# Patient Record
Sex: Female | Born: 1935 | Race: White | Hispanic: No | Smoking: Never smoker
Health system: Southern US, Academic
[De-identification: ages and names within clinical notes are randomized; demographics above are authoritative.]

## PROBLEM LIST (undated history)

## (undated) ENCOUNTER — Emergency Department (HOSPITAL_COMMUNITY): Admission: EM | Payer: Medicare Other | Source: Home / Self Care

## (undated) DIAGNOSIS — M3320 Polymyositis, organ involvement unspecified: Secondary | ICD-10-CM

## (undated) DIAGNOSIS — H938X9 Other specified disorders of ear, unspecified ear: Secondary | ICD-10-CM

## (undated) DIAGNOSIS — Z889 Allergy status to unspecified drugs, medicaments and biological substances status: Secondary | ICD-10-CM

## (undated) DIAGNOSIS — H538 Other visual disturbances: Secondary | ICD-10-CM

## (undated) DIAGNOSIS — I10 Essential (primary) hypertension: Secondary | ICD-10-CM

## (undated) DIAGNOSIS — M899 Disorder of bone, unspecified: Secondary | ICD-10-CM

## (undated) DIAGNOSIS — R110 Nausea: Secondary | ICD-10-CM

## (undated) DIAGNOSIS — J42 Unspecified chronic bronchitis: Secondary | ICD-10-CM

## (undated) DIAGNOSIS — K219 Gastro-esophageal reflux disease without esophagitis: Secondary | ICD-10-CM

## (undated) DIAGNOSIS — R197 Diarrhea, unspecified: Secondary | ICD-10-CM

## (undated) DIAGNOSIS — A310 Pulmonary mycobacterial infection: Principal | ICD-10-CM

## (undated) DIAGNOSIS — E039 Hypothyroidism, unspecified: Secondary | ICD-10-CM

## (undated) DIAGNOSIS — C50919 Malignant neoplasm of unspecified site of unspecified female breast: Secondary | ICD-10-CM

## (undated) DIAGNOSIS — R911 Solitary pulmonary nodule: Secondary | ICD-10-CM

## (undated) DIAGNOSIS — M1990 Unspecified osteoarthritis, unspecified site: Secondary | ICD-10-CM

## (undated) DIAGNOSIS — J189 Pneumonia, unspecified organism: Secondary | ICD-10-CM

## (undated) DIAGNOSIS — I509 Heart failure, unspecified: Secondary | ICD-10-CM

## (undated) DIAGNOSIS — M797 Fibromyalgia: Secondary | ICD-10-CM

## (undated) DIAGNOSIS — E079 Disorder of thyroid, unspecified: Secondary | ICD-10-CM

## (undated) DIAGNOSIS — E7800 Pure hypercholesterolemia, unspecified: Secondary | ICD-10-CM

## (undated) HISTORY — PX: BREAST BIOPSY: SHX20

## (undated) HISTORY — DX: Solitary pulmonary nodule: R91.1

## (undated) HISTORY — PX: DILATION AND CURETTAGE OF UTERUS: SHX78

## (undated) HISTORY — PX: LAPAROSCOPIC CHOLECYSTECTOMY: SUR755

## (undated) HISTORY — PX: CATARACT EXTRACTION W/ INTRAOCULAR LENS  IMPLANT, BILATERAL: SHX1307

## (undated) HISTORY — DX: Pulmonary mycobacterial infection: A31.0

## (undated) HISTORY — PX: APPENDECTOMY: SHX54

## (undated) HISTORY — DX: Allergy status to unspecified drugs, medicaments and biological substances status: Z88.9

## (undated) HISTORY — PX: VAGINAL HYSTERECTOMY: SUR661

## (undated) HISTORY — DX: Essential (primary) hypertension: I10

## (undated) HISTORY — DX: Other visual disturbances: H53.8

## (undated) HISTORY — DX: Malignant neoplasm of unspecified site of unspecified female breast: C50.919

## (undated) HISTORY — DX: Nausea: R11.0

## (undated) HISTORY — DX: Polymyositis, organ involvement unspecified: M33.20

## (undated) HISTORY — PX: MASTECTOMY, PARTIAL: SHX709

## (undated) HISTORY — DX: Disorder of bone, unspecified: M89.9

## (undated) HISTORY — DX: Diarrhea, unspecified: R19.7

## (undated) HISTORY — PX: NASAL SINUS SURGERY: SHX719

## (undated) HISTORY — DX: Other specified disorders of ear, unspecified ear: H93.8X9

## (undated) HISTORY — PX: TUBAL LIGATION: SHX77

## (undated) HISTORY — DX: Fibromyalgia: M79.7

## (undated) HISTORY — DX: Pure hypercholesterolemia, unspecified: E78.00

## (undated) HISTORY — PX: HX BREAST LUMPECTOMY: SHX2

---

## 1994-09-28 ENCOUNTER — Observation Stay (HOSPITAL_COMMUNITY): Payer: Self-pay

## 1994-11-20 ENCOUNTER — Encounter: Payer: Self-pay | Admitting: Pulmonary Disease

## 1995-08-11 ENCOUNTER — Encounter: Payer: Self-pay | Admitting: Pulmonary Disease

## 1995-12-18 ENCOUNTER — Encounter: Payer: Self-pay | Admitting: Pulmonary Disease

## 1998-04-27 ENCOUNTER — Encounter: Payer: Self-pay | Admitting: General Surgery

## 1998-04-27 ENCOUNTER — Ambulatory Visit (HOSPITAL_COMMUNITY): Admission: RE | Admit: 1998-04-27 | Discharge: 1998-04-27 | Payer: Self-pay | Admitting: General Surgery

## 1998-05-21 ENCOUNTER — Encounter: Payer: Self-pay | Admitting: Pulmonary Disease

## 1998-05-22 ENCOUNTER — Encounter: Payer: Self-pay | Admitting: Critical Care Medicine

## 1998-05-22 ENCOUNTER — Ambulatory Visit (HOSPITAL_COMMUNITY): Admission: RE | Admit: 1998-05-22 | Discharge: 1998-05-22 | Payer: Self-pay | Admitting: Critical Care Medicine

## 1998-05-22 ENCOUNTER — Encounter: Payer: Self-pay | Admitting: Pulmonary Disease

## 1998-05-25 ENCOUNTER — Ambulatory Visit: Admission: RE | Admit: 1998-05-25 | Discharge: 1998-05-25 | Payer: Self-pay | Admitting: Critical Care Medicine

## 1998-05-25 ENCOUNTER — Encounter: Payer: Self-pay | Admitting: Pulmonary Disease

## 1998-05-30 ENCOUNTER — Ambulatory Visit (HOSPITAL_COMMUNITY): Admission: RE | Admit: 1998-05-30 | Discharge: 1998-05-31 | Payer: Self-pay | Admitting: Critical Care Medicine

## 1998-05-30 ENCOUNTER — Encounter: Payer: Self-pay | Admitting: Critical Care Medicine

## 1998-05-31 ENCOUNTER — Encounter: Payer: Self-pay | Admitting: Critical Care Medicine

## 1998-06-18 ENCOUNTER — Encounter: Payer: Self-pay | Admitting: Pulmonary Disease

## 1998-12-18 ENCOUNTER — Encounter: Payer: Self-pay | Admitting: Pulmonary Disease

## 1999-01-25 ENCOUNTER — Other Ambulatory Visit: Admission: RE | Admit: 1999-01-25 | Discharge: 1999-01-25 | Payer: Self-pay | Admitting: *Deleted

## 1999-07-19 ENCOUNTER — Encounter: Admission: RE | Admit: 1999-07-19 | Discharge: 1999-10-17 | Payer: Self-pay | Admitting: *Deleted

## 2000-01-29 ENCOUNTER — Ambulatory Visit (HOSPITAL_COMMUNITY): Admission: RE | Admit: 2000-01-29 | Discharge: 2000-01-29 | Payer: Self-pay

## 2001-05-29 ENCOUNTER — Encounter: Admission: RE | Admit: 2001-05-29 | Discharge: 2001-05-29 | Payer: Self-pay

## 2003-01-31 ENCOUNTER — Ambulatory Visit (HOSPITAL_COMMUNITY): Admission: RE | Admit: 2003-01-31 | Discharge: 2003-01-31 | Payer: Self-pay

## 2004-06-03 ENCOUNTER — Ambulatory Visit: Payer: Self-pay | Admitting: Family Medicine

## 2004-09-13 ENCOUNTER — Ambulatory Visit: Payer: Self-pay | Admitting: Oncology

## 2004-12-20 ENCOUNTER — Ambulatory Visit: Payer: Self-pay | Admitting: Family Medicine

## 2005-04-21 ENCOUNTER — Ambulatory Visit: Payer: Self-pay | Admitting: Family Medicine

## 2005-06-26 ENCOUNTER — Ambulatory Visit: Payer: Self-pay | Admitting: Oncology

## 2005-09-18 ENCOUNTER — Ambulatory Visit: Payer: Self-pay | Admitting: Oncology

## 2006-06-30 ENCOUNTER — Ambulatory Visit

## 2006-09-17 ENCOUNTER — Ambulatory Visit: Payer: Self-pay | Admitting: Oncology

## 2006-12-25 ENCOUNTER — Inpatient Hospital Stay (EMERGENCY_DEPARTMENT_HOSPITAL)
Admission: EM | Admit: 2006-12-25 | Discharge: 2006-12-26 | Disposition: A | Discharge status: Home / Self Care | Payer: BC Managed Care – PPO

## 2006-12-30 ENCOUNTER — Encounter (INDEPENDENT_AMBULATORY_CARE_PROVIDER_SITE_OTHER): Payer: BLUE CROSS/BLUE SHIELD | Admitting: Anesthesiology

## 2006-12-30 ENCOUNTER — Encounter (INDEPENDENT_AMBULATORY_CARE_PROVIDER_SITE_OTHER): Payer: Self-pay | Admitting: "Endocrinology

## 2007-01-01 ENCOUNTER — Ambulatory Visit: Payer: Self-pay | Admitting: Oncology

## 2007-01-12 ENCOUNTER — Encounter (INDEPENDENT_AMBULATORY_CARE_PROVIDER_SITE_OTHER): Payer: BLUE CROSS/BLUE SHIELD | Admitting: Anesthesiology

## 2008-06-17 ENCOUNTER — Encounter: Payer: Self-pay | Admitting: Pulmonary Disease

## 2008-06-18 ENCOUNTER — Encounter: Payer: Self-pay | Admitting: Pulmonary Disease

## 2008-06-19 ENCOUNTER — Encounter: Payer: Self-pay | Admitting: Pulmonary Disease

## 2008-07-19 ENCOUNTER — Ambulatory Visit: Payer: Self-pay | Admitting: Pulmonary Disease

## 2008-07-19 DIAGNOSIS — J309 Allergic rhinitis, unspecified: Secondary | ICD-10-CM | POA: Insufficient documentation

## 2008-07-19 DIAGNOSIS — R930 Abnormal findings on diagnostic imaging of skull and head, not elsewhere classified: Secondary | ICD-10-CM | POA: Insufficient documentation

## 2008-07-19 DIAGNOSIS — J159 Unspecified bacterial pneumonia: Secondary | ICD-10-CM | POA: Insufficient documentation

## 2008-07-19 DIAGNOSIS — I10 Essential (primary) hypertension: Secondary | ICD-10-CM | POA: Insufficient documentation

## 2008-07-19 DIAGNOSIS — Z853 Personal history of malignant neoplasm of breast: Secondary | ICD-10-CM | POA: Insufficient documentation

## 2008-07-20 ENCOUNTER — Telehealth: Payer: Self-pay | Admitting: Pulmonary Disease

## 2008-07-27 ENCOUNTER — Ambulatory Visit: Payer: Self-pay | Admitting: Pulmonary Disease

## 2008-07-27 DIAGNOSIS — A310 Pulmonary mycobacterial infection: Secondary | ICD-10-CM | POA: Insufficient documentation

## 2008-08-24 ENCOUNTER — Ambulatory Visit: Payer: Self-pay | Admitting: Pulmonary Disease

## 2008-09-12 ENCOUNTER — Telehealth (INDEPENDENT_AMBULATORY_CARE_PROVIDER_SITE_OTHER): Payer: Self-pay | Admitting: *Deleted

## 2008-10-05 ENCOUNTER — Encounter: Payer: Self-pay | Admitting: Pulmonary Disease

## 2008-10-10 ENCOUNTER — Telehealth (INDEPENDENT_AMBULATORY_CARE_PROVIDER_SITE_OTHER): Payer: Self-pay | Admitting: *Deleted

## 2008-11-02 ENCOUNTER — Ambulatory Visit: Payer: Self-pay | Admitting: Pulmonary Disease

## 2008-11-03 ENCOUNTER — Encounter: Payer: Self-pay | Admitting: Pulmonary Disease

## 2008-11-07 ENCOUNTER — Telehealth (INDEPENDENT_AMBULATORY_CARE_PROVIDER_SITE_OTHER): Payer: Self-pay | Admitting: *Deleted

## 2008-11-16 ENCOUNTER — Ambulatory Visit: Payer: Self-pay | Admitting: Infectious Diseases

## 2008-12-26 ENCOUNTER — Ambulatory Visit: Payer: Self-pay | Admitting: Infectious Diseases

## 2008-12-26 DIAGNOSIS — M3320 Polymyositis, organ involvement unspecified: Secondary | ICD-10-CM | POA: Insufficient documentation

## 2008-12-27 ENCOUNTER — Encounter: Payer: Self-pay | Admitting: Infectious Diseases

## 2009-02-05 ENCOUNTER — Telehealth: Payer: Self-pay | Admitting: Pulmonary Disease

## 2009-02-05 ENCOUNTER — Telehealth: Payer: Self-pay | Admitting: Infectious Diseases

## 2009-02-14 ENCOUNTER — Ambulatory Visit: Payer: Self-pay | Admitting: Infectious Diseases

## 2009-04-11 ENCOUNTER — Telehealth: Payer: Self-pay | Admitting: Infectious Diseases

## 2009-05-17 ENCOUNTER — Telehealth: Payer: Self-pay | Admitting: Infectious Diseases

## 2009-06-14 ENCOUNTER — Ambulatory Visit: Payer: Self-pay | Admitting: Infectious Diseases

## 2009-06-14 ENCOUNTER — Ambulatory Visit (HOSPITAL_COMMUNITY): Admission: RE | Admit: 2009-06-14 | Discharge: 2009-06-14 | Payer: Self-pay | Admitting: Infectious Diseases

## 2009-06-14 DIAGNOSIS — IMO0002 Reserved for concepts with insufficient information to code with codable children: Secondary | ICD-10-CM | POA: Insufficient documentation

## 2009-06-14 LAB — CONVERTED CEMR LAB
ALT: 13 units/L (ref 0–35)
AST: 14 units/L (ref 0–37)
Albumin: 4.3 g/dL (ref 3.5–5.2)
Alkaline Phosphatase: 46 units/L (ref 39–117)
BUN: 11 mg/dL (ref 6–23)
Basophils Absolute: 0.1 10*3/uL (ref 0.0–0.1)
Basophils Relative: 1 % (ref 0–1)
CO2: 27 meq/L (ref 19–32)
Calcium: 10 mg/dL (ref 8.4–10.5)
Chloride: 103 meq/L (ref 96–112)
Creatinine, Ser: 0.84 mg/dL (ref 0.40–1.20)
Eosinophils Absolute: 0.3 10*3/uL (ref 0.0–0.7)
Eosinophils Relative: 5 % (ref 0–5)
Glucose, Bld: 83 mg/dL (ref 70–99)
HCT: 39 % (ref 36.0–46.0)
Hemoglobin: 13.1 g/dL (ref 12.0–15.0)
Lymphocytes Relative: 21 % (ref 12–46)
Lymphs Abs: 1.2 10*3/uL (ref 0.7–4.0)
MCHC: 33.6 g/dL (ref 30.0–36.0)
MCV: 95.4 fL (ref >=78.0)
Monocytes Absolute: 0.5 10*3/uL (ref 0.1–1.0)
Monocytes Relative: 9 % (ref 3–12)
Neutro Abs: 3.6 10*3/uL (ref 1.7–7.7)
Neutrophils Relative %: 64 % (ref 43–77)
Platelets: 286 10*3/uL (ref 150–400)
Potassium: 4.3 meq/L (ref 3.5–5.3)
RBC: 4.09 M/uL (ref 3.87–5.11)
RDW: 12.5 % (ref 11.5–15.5)
Sodium: 141 meq/L (ref 135–145)
Total Bilirubin: 0.2 mg/dL — ABNORMAL LOW (ref 0.3–1.2)
Total Protein: 6.8 g/dL (ref 6.0–8.3)
WBC: 5.6 10*3/uL (ref 4.0–10.5)

## 2009-06-15 ENCOUNTER — Encounter: Payer: Self-pay | Admitting: Pulmonary Disease

## 2009-06-21 ENCOUNTER — Telehealth: Payer: Self-pay | Admitting: Infectious Diseases

## 2009-07-18 ENCOUNTER — Ambulatory Visit: Payer: Self-pay | Admitting: Infectious Diseases

## 2009-07-18 DIAGNOSIS — R222 Localized swelling, mass and lump, trunk: Secondary | ICD-10-CM | POA: Insufficient documentation

## 2009-09-18 ENCOUNTER — Ambulatory Visit: Payer: Self-pay | Admitting: Infectious Diseases

## 2009-09-18 ENCOUNTER — Ambulatory Visit (HOSPITAL_COMMUNITY): Admission: RE | Admit: 2009-09-18 | Discharge: 2009-09-18 | Payer: Self-pay | Admitting: Infectious Diseases

## 2010-08-13 NOTE — Progress Notes (Signed)
Summary: talk to nurse  Phone Note Call from Patient   Caller: Patient Call For: clance Summary of Call: should she continue taking antibiotic? Initial call taken by: Rickard Patience,  February 05, 2009 1:17 PM  Follow-up for Phone Call        Spoke with pt; made aware to call Dr. 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 Reynaldo Minium CMA  February 05, 2009 3:44 PM

## 2010-08-13 NOTE — Progress Notes (Signed)
Summary: signature/ fax requested asap for chest ct  Phone Note From Other Clinic   Caller: amanda at [facility] hospital Call For: clance Summary of Call: needs kc's signature (stamped signature was received but not acceptable). needs for chest ct. fax # [phone] (ph# [phone] option 2). caller says she has been trying to get this taken care of since 2/12. needs asap.  Initial call taken by: Tivis Ringer,  September 12, 2008 12:39 PM  Follow-up for Phone Call        faxed order with original sig to [facility]  Follow-up by: Oneita Jolly,  September 12, 2008 2:42 PM

## 2010-08-13 NOTE — Assessment & Plan Note (Signed)
Summary: 5WK F/U/OK PER DR FITZGERALD/VS   Visit Type:  Follow-up Referring Provider:  Dough Primary Provider:  Dina Rich  CC:  5 week follow up visit.  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  I first saw 5/10 in consult for Dr Shelle Iron regarding her intolerances to her MAI therapy.   Follow up December 26 2008 Since last seen she has tried stopping one med at at time to see if she could tell which one was causing the itching nad diarrhea, n/v. She could not tell which one was problematic so has been back on the meds since 5/22 and tolerating them ok.  Breathing stable but some DOE which is stable.  No cough but some mild tickle in throat - non productive.  No difficulty with vision.  Prior History - seen first 5/10 in ID clinic Dec 09 - Became acutely ill  with productive cough, fevers, V diarrhea, HA   and dxed with double PNA and a UTI.  ? flu.  ruled out for TB. Admitted to [facility] and evaluated for TB and treated with abx.  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    In Jan she was started on Rif, ethambutol and azithromycin since then. Has had some intolerance to the meds with rash over last month as well as swelling in legs.  Saw derm who felt it was due to her norvasc and this was stopped and the swelling improved.  The rash seems to be fading with creams. She also reports some diarrhea since starting the meds.  Her bowels are loose at least 3x every day.  No blood.  Some crampy abd pain.   She is allergic to avelox but not sure of reaction  May 2010 Still some sob and doe - not back to baseline since before December.  Cough is non productive except some PND in morning. No fevers chills or wt loss.  No night sweats.   Since starting the meds she has not really improved but does feel a little stronger  with each day.  Her maincomplaints is the side effects   Preventive Screening-Counseling & Management  Alcohol-Tobacco     Alcohol drinks/day: 0     Smoking Status: never  Caffeine-Diet-Exercise     Caffeine use/day: 2 cups of coffee per day     Does Patient Exercise: yes     Type of exercise: walking     Exercise (avg: min/session): <30     Times/week: <3  Safety-Violence-Falls     Seat Belt Use: yes   Updated Prior Medication List: GABAPENTIN 400 MG CAPS (GABAPENTIN) Take 1 tablet by mouth three times a day PREDNISONE 5 MG TABS (PREDNISONE) take 1 1/2 tabs by mouth daily PROZAC 20 MG CAPS (FLUOXETINE HCL) take 1 tab by mouth each morning * CALCIUM 2000 Take 1 tablet by mouth once a day MULTIVITAMINS  CAPS (MULTIPLE VITAMIN) Take 1 tablet by mouth once a day TRAMADOL HCL 50 MG TABS (TRAMADOL HCL) Take 1 tablet by mouth three times a day AZITHROMYCIN 250 MG TABS (AZITHROMYCIN) one each day RIFAMPIN 300 MG CAPS (RIFAMPIN) 2 each day ETHAMBUTOL HCL 400 MG TABS (ETHAMBUTOL HCL) 2 and 1/2 tabs a day WELLBUTRIN 100 MG TABS (BUPROPION HCL) Take 1 tab by mouth  at bedtime * VITAMIN D take by mouth daily SYNTHROID 75 MCG TABS (LEVOTHYROXINE SODIUM) one tablet daily  Current Allergies: ! BIAXIN ! * AVELOX ! CODEINE ! 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  followed by [facility] HYPERTENSION (ICD-401.9) ALLERGIC RHINITIS (ICD-477.9) h/o polymyositis-[facility] - dr Forbes Cellar h/o pulmonary nodule-s/p bx with ptx..nondiagnostic 1999    Past Surgical History: Last updated: 07/19/2008 s/p appendectomy, tubal ligation, hysterectomy, gallbladder s/p sinus surgery  Family History: Last updated: 07/19/2008 allergies: mother, children rheumatism: mother cancer: sister (lymphoma)   Social  History: Last updated: 07/19/2008 Patient never smoked.  pt is widowed. pt has children. pt is retired from office work.    Risk Factors: Alcohol Use: 0 (12/26/2008) Caffeine Use: 2 cups of coffee per day (12/26/2008) Exercise: yes (12/26/2008)  Risk Factors: Smoking Status: never (12/26/2008)  Review of Systems       11 systems reviewed and negative except per HPI  no fevesr chills, NS. Wt stable.    Vital Signs:  Patient profile:   76 year old female Menstrual status:  postmenopausal Height:      61 inches (154.94 cm) Weight:      135.7 pounds (61.68 kg) BMI:     25.73 Temp:     98.4 degrees F (36.89 degrees C) oral Pulse rate:   77 / minute BP sitting:   132 / 85  (right arm)  Vitals Entered By: Baxter Hire) (December 26, 2008 2:12 PM) CC: 5 week follow up visit Is Patient Diabetic? No Pain Assessment Patient in pain? no      Nutritional Status BMI of 25 - 29 = overweight Nutritional Status Detail appetite is fair per patient  Have you ever been in a relationship where you felt threatened, hurt or afraid?No   Does patient need assistance? Functional Status Self care Ambulation Normal   Physical Exam  General:  alert and well-developed.   Eyes:  vision grossly intact, pupils equal, and pupils round.   Mouth:  good dentition.   Lungs:  normal respiratory effort, no intercostal retractions, and no accessory muscle use.   Heart:  normal rate and regular rhythm.   Abdomen:  soft, non-tender, and normal bowel sounds.   Msk:  normal ROM and no joint tenderness.   Extremities:  no cce Neurologic:  alert & oriented X3 and cranial nerves II-XII intact.   Skin:  no rashes.   Cervical Nodes:  no anterior cervical adenopathy and no posterior cervical adenopathy.   Psych:  Oriented X3, memory intact for recent and remote, and normally interactive.     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  Three times a week therapy is a consideration but it is not recommended in patients with cavity disease. 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  I think that the event in Dec 09 which brought her to the hospital and back into pulmonary care is unrelated to the MAC (except in that the damaged lung parenchyma predisposes her to  further  recurrent PNAs with the more usual bacterial species.   I agree with Dr Shelle Iron that it is worth trying to treat her as long as she can tolerate it with the triple drug therapy.  However I doubt we will make much improvement in her pulm status or CT findings but hopefully we can prevent them from worsening.  Unfortunately she is having some side effects (although they seem to be tolerable per my discussion with the pt).  THe swelling in her legs and rash she had is resolving after stopping her norvasc so I do not think these are related to the MAC meds. We discussed her options at length including the possible substitution of avelox for one of the other meds (although she is listed as being allergic to avelox she is unclear what the reaction was so it is likely safe to rechallenge her if needed). For now we will have her stop each of the 3 meds in turn one at a time and keep a diary to see if stopping any of them makes her sxs of diarrhea resolve.  If we can ID which one is the culprit we can possibly substitute the avelox for this (although I have to first review her sensitivity MIC results with pharmacy to ensure susceptibility). Has had an eye exam and suggested to follow up regularly  Orders: Est. Patient Level IV (62130) T-Culture & Smear [employer] (87206/[id number])  Problem # 2:  POLYMYOSITIS (ICD-710.4)  Follows  with Dr Orson Ape and is to see him tomorrow.  Orders: Est. Patient Level IV (86578) T-Culture & Smear [employer] (87206/[id number])  Medications Added to Medication List This Visit: 1)  Synthroid 75 Mcg Tabs (Levothyroxine sodium) .... One tablet daily  Patient Instructions: 1)  Please schedule a follow-up appointment in 3 months. 2)  Please  follow up with Dr Orson Ape and Dr Shelle Iron.

## 2010-08-13 NOTE — Progress Notes (Signed)
Summary: customer called for results/TY  Phone Note Call from Patient   Caller: Patient Call For: Clydie Braun MD Summary of Call: Patient wanting results of labwork, and xray. Results given, and she wanted Dr. Sampson Goon to call her once the comparison of the ct's were done from the disc forwarded from Dr. Shelle Iron. Initial call taken by: Starleen Arms CMA,  June 21, 2009 3:38 PM

## 2010-08-13 NOTE — Assessment & Plan Note (Signed)
Summary: consult for pna, mac, abnl cxr.   Referred by:  Sol Passer PCP:  Dina Rich  Chief Complaint:  Pulmonary Consult.  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  She has a h/o RML syndrome with what sounds like bronchiectasis by her old [place] records, and she has had a bronch culture positive for MAC in the 1990's.  She also has a history of polymyositis, and is managed by Estill Bakes with prednisone 7.5mg  currently.  To her knowledge, she has never been on more aggressive immunosuppressives.  At baseline, she denies any doe, but does have a mild chronic cough felt secondary to postnasal drip and reflux.  Last year, she began to have increasing fatigue and some sob with heavier exertion.  She did not have worsening cough at that time.  In December, she began to have worsening cough with purulence, GI symptoms, and fevers/chills.  She was diagnosed with PNA, and her admit note discusses ct chest with bilat lower lobe pna, and also cavitary pulmonary nodules?  Unfortunately, her xrays are not available for review.  I do have an afb culture from that hospitalization that was +for MAC.  The pt is improved from her admission, and has no significant cough or mucus.  Her only complaint is weakness.  She denies weight loss, and has had no anorexia.     Prior Medications Reviewed Using: List Brought by Patient  Updated Prior Medication List: GABAPENTIN 400 MG CAPS (GABAPENTIN) Take 1 tablet by mouth three times a day PREDNISONE 5 MG TABS (PREDNISONE) take 1 1/2 tabs by mouth daily SYNTHROID 50 MCG TABS (LEVOTHYROXINE SODIUM) Take 1 tablet by mouth once a day * PROZAC (UNKNOWN DOSAGE) Take 1 tablet by mouth once a day AMLODIPINE BESYLATE 5 MG TABS (AMLODIPINE BESYLATE) Take 1 tablet by mouth once a day AMBIEN 10 MG TABS (ZOLPIDEM TARTRATE) Take 1 tab by mouth at bedtime ZOMETA 4 MG/5ML CONC (ZOLEDRONIC ACID) 1 injection per year.  took last  dose March 2009 * CALCIUM 2000 Take 1 tablet by mouth once a day MULTIVITAMINS  CAPS (MULTIPLE VITAMIN) Take 1 tablet by mouth once a day TRAMADOL HCL 50 MG TABS (TRAMADOL HCL) Take 1 tablet by mouth three times a day  Current Allergies: ! BIAXIN ! * AVELOX ! CODEINE ! 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  followed by [facility]    HYPERTENSION (ICD-401.9)    ALLERGIC RHINITIS (ICD-477.9)    h/o polymyositis-[facility]    h/o pulmonary nodule-s/p bx with ptx..nondiagnostic       Past Surgical History:    s/p appendectomy, tubal ligation, hysterectomy, gallbladder    s/p sinus surgery   Family History:    allergies: mother, children    rheumatism: mother    cancer: sister (lymphoma)   Social History:    Patient never smoked.     pt is widowed.    pt has children.    pt is retired from office work.     Risk Factors:  Tobacco use:  never   Review of Systems      See HPI   Vital Signs:  Patient Profile:   76 Years Old Female Weight:      132.31 pounds O2 Sat:      97 % O2 treatment:    Room Air Temp:  98.3 degrees F oral Pulse rate:   82 / minute BP sitting:   120 / 74  (right arm) Cuff size:   regular  Vitals Entered By: Arman Filter LPN (July 19, 2008 10:44 AM)                 Physical Exam  General:     wd female in nad Eyes:     PERRLA and EOMI.   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  I would like to see her xrays to verify this, and this pt will bring a disk from [facility] for me.  She has a h/o RML syndrome, and likely has bronchiectasis in that area.  Problem # 2:  ABNORMAL CHEST XRAY (ICD-793.1) the pt has a cxr report that describes pulmonary nodules, some with cavitation.  I suspect this is related to her MAC that has been isolated from her sputum recently.  The question is whether this is simply a colonizer, or whether this may be causing a true infection.  She does not have significant constitutional symptoms at this time, and she is having no cough or other pulmonary symptoms.  I would like to see both old and new xrays before I make a judgement.  I have had a long conversation with her and her daughter, and discussed the clinical course of MAC and the difficulty in treating it.  I suspect this is completely different from her episode of pna.  The other question that I have is whether these nodules could be related to a vasculitis with her h/o polymyositis, or possibly related to her h/o breast cancer??  Await films for review.  Medications Added to Medication List This Visit: 1)  Gabapentin 400 Mg Caps (Gabapentin) .... Take 1 tablet by mouth three times a day 2)  Prednisone 5 Mg Tabs (Prednisone) .... Take 1 1/2 tabs by mouth daily 3)  Synthroid 50 Mcg Tabs (Levothyroxine sodium) .... Take 1 tablet by mouth once a day 4)  Prozac (unknown Dosage)  .... Take 1 tablet by mouth once a day 5)  Amlodipine Besylate 5 Mg Tabs (Amlodipine besylate) .... Take 1 tablet by mouth once a day 6)  Ambien 10 Mg Tabs (Zolpidem tartrate) .... Take 1 tab by mouth at bedtime 7)  Zometa 4 Mg/47ml Conc (Zoledronic acid) .Marland Kitchen.. 1 injection per year.  took last dose march 2009 8)  Calcium 2000  .... Take 1 tablet by mouth once a day 9)  Multivitamins Caps (Multiple vitamin) .... Take 1 tablet by mouth once a day 10)  Tramadol Hcl 50 Mg Tabs  (Tramadol hcl) .... Take 1 tablet by mouth three times a day   Patient Instructions: 1)  go by [facility] and pick up disk with xrays 2)  schedule f/u apptm with me for next week 3)  go by Dr. Renaldo Reel  office and get his xrays to bring to next visit.    ]

## 2010-08-13 NOTE — Miscellaneous (Signed)
Summary: disk to [facility] for comparison and addendum report.  Clinical Lists Changes  I have taken a disc from [facility] with ct chest from 2009 and have given to Dr. Abelino Derrick of [facility], to compare to pt's most recent ct.  He will dictate an addendum report.

## 2010-08-13 NOTE — Assessment & Plan Note (Signed)
Summary: f/u discuss antibiotics /dde   Referring Provider:  Dough Primary Provider:  Dina Rich  CC:  f/u ov  to discuss antibiotic options.  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  I first saw 5/10 in consult for Dr Shelle Iron regarding her intolerances to her MAI therapy.   Follow up August 4th in Medicare donut hole and wondering if can stop meds.  Still somewhat short of breath when exertion but able to go one flight of stairs before stoppi, still with dry cough.    Follow up December 26 2008 Since last seen she has tried stopping one med at at time to see if she could tell which one was causing the itching nad diarrhea, n/v. She could not tell which one was problematic so has been back on the meds since 5/22 and tolerating them ok.  Breathing stable but some DOE which is stable.  No cough but some mild tickle in throat - non productive.  No difficulty with vision.  Prior History - seen first 5/10 in ID clinic Dec 09 - Became acutely ill  with productive cough, fevers, V diarrhea, HA   and dxed with double PNA and a UTI.  ? flu.  ruled out for TB. Admitted to [facility] and evaluated for TB and treated with abx.  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    In Jan she was started on Rif, ethambutol and azithromycin since then. Has had intolerance to the meds with rash over last month as well as swelling in legs.  Saw derm who felt it was due to her norvasc and this was stopped and the swelling improved.  The rash seems to be fading with creams.  She is allergic to avelox but not sure of reaction  May 2010 Still some sob and doe - not back to baseline since before December.  Cough is non productive except some PND in morning. No fevers chills or wt loss.  No night sweats.   Since starting the meds she has not  really improved but does feel a little stronger with each day.  Her maincomplaints is the side effects   Preventive Screening-Counseling & Management  Alcohol-Tobacco     Alcohol drinks/day: 0     Smoking Status: never   Prior Medication List:  GABAPENTIN 400 MG CAPS (GABAPENTIN) Take 1 tablet by mouth three times a day PREDNISONE 5 MG TABS (PREDNISONE) take 1 1/2 tabs by mouth daily PROZAC 20 MG CAPS (FLUOXETINE HCL) take 1 tab by mouth each morning * CALCIUM 2000 Take 1 tablet by mouth once a day MULTIVITAMINS  CAPS (MULTIPLE VITAMIN) Take 1 tablet by mouth once a day TRAMADOL HCL 50 MG TABS (TRAMADOL HCL) Take 1 tablet by mouth three times a day AZITHROMYCIN 250 MG TABS (AZITHROMYCIN) one each day RIFAMPIN 300 MG CAPS (RIFAMPIN) 2 each day ETHAMBUTOL HCL 400 MG TABS (ETHAMBUTOL HCL) 2 and 1/2 tabs a day WELLBUTRIN 100 MG TABS (BUPROPION HCL) Take 1 tab by mouth at bedtime * VITAMIN D take by mouth daily SYNTHROID 75 MCG TABS (LEVOTHYROXINE SODIUM) one tablet daily   Updated Prior Medication List: GABAPENTIN 400 MG CAPS (GABAPENTIN) Take 1 tablet by mouth three times a day PREDNISONE 5 MG TABS (PREDNISONE) take 1 1/2 tabs  by mouth daily PROZAC 20 MG CAPS (FLUOXETINE HCL) take 1 tab by mouth each morning * CALCIUM 2000 Take 1 tablet by mouth once a day MULTIVITAMINS  CAPS (MULTIPLE VITAMIN) Take 1 tablet by mouth once a day TRAMADOL HCL 50 MG TABS (TRAMADOL HCL) Take 1 tablet by mouth three times a day AZITHROMYCIN 250 MG TABS (AZITHROMYCIN) one each day RIFAMPIN 300 MG CAPS (RIFAMPIN) 2 each day ETHAMBUTOL HCL 400 MG TABS (ETHAMBUTOL HCL) 2 and 1/2 tabs a day WELLBUTRIN 100 MG TABS (BUPROPION HCL) Take 1 tab by mouth at bedtime * VITAMIN D take by mouth daily SYNTHROID 75 MCG TABS (LEVOTHYROXINE SODIUM) one tablet daily  Current Allergies (reviewed today): ! BIAXIN ! * AVELOX ! CODEINE ! 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  followed by [facility] HYPERTENSION (ICD-401.9) ALLERGIC RHINITIS (ICD-477.9) h/o polymyositis-[facility] - dr Forbes Cellar h/o pulmonary nodule-s/p bx with ptx..nondiagnostic 1999    Past Surgical History: Last updated: 07/19/2008 s/p appendectomy, tubal ligation, hysterectomy, gallbladder s/p sinus surgery  Family History: Last updated: 07/19/2008 allergies: mother, children rheumatism: mother cancer: sister (lymphoma)   Social History: Last updated: 07/19/2008 Patient never smoked.  pt is widowed. pt has children. pt is retired from office work.    Risk Factors: Alcohol Use: 0 (02/14/2009) Caffeine Use: 2 cups of coffee per day (12/26/2008) Exercise: yes (12/26/2008)  Risk Factors: Smoking Status: never (02/14/2009)  Review of Systems       11 systems reviewed and negative except per HPI   Vital Signs:  Patient profile:   75 year old female Menstrual status:  postmenopausal Height:      61 inches Weight:      132.1 pounds BMI:     25.05 BSA:     1.59 Temp:     98.5 degrees F oral Pulse rate:   79 / minute BP sitting:   135 / 85  (right arm)  Vitals Entered By: Tomasita Morrow RN (February 14, 2009 4:21 PM) CC: f/u ov  to discuss antibiotic options Is Patient Diabetic? No Nutritional Status BMI of 25 - 29 = overweight Nutritional Status Detail normal  Have you ever been in a relationship where you felt threatened, hurt or afraid?No  Domestic Violence Intervention none  Does patient need assistance? Functional Status Self care Ambulation Normal   Physical Exam  General:  alert, well-developed, and well-nourished.   Head:  normocephalic and atraumatic.   Mouth:  fair dentition.   Neck:  supple.   Lungs:  good airmovement, no crackles Heart:  normal rate and regular rhythm.   Abdomen:  soft and normal bowel sounds.   Msk:   normal ROM and no joint tenderness.   Neurologic:  alert & oriented X3 and cranial nerves II-XII intact.   Skin:  no rashes.   Cervical Nodes:  no anterior cervical adenopathy.   Psych:  Oriented X3 and memory intact for recent and remote.     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  This may be  cheaper.  I have also spoken to Byrd Hesselbach re options through "[facility]" WIll continue with daily therapy as she seems to be tolerating it now.  Three times a week therapy is a consideration but it is not recommended in patients with cavity disease.  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   the event in Dec 09 which brought her to the hospital and back into pulmonary care is unrelated to the MAC (except in that the damaged lung parenchyma predisposes her to further  recurrent PNAs with the more usual bacterial species.   it is worth trying to treat her as long as she can tolerate it with the triple drug therapy.  However I doubt we will make much improvement in her pulm status or CT findings but hopefully we can prevent them from worsening.    Has had an eye exam and suggested to follow up regularly  Orders: Est. Patient Level IV (51884) T-Culture & Smear AFB (87206/[id number])  Problem # 2:  POLYMYOSITIS (ICD-710.4) Assessment: Unchanged  Patient Instructions: 1)  Please reschedule appointment for early December. 2)  Continue to take the azithromycin, ethambutol and rifampin as directed. Prescriptions: ETHAMBUTOL HCL 400 MG TABS (ETHAMBUTOL HCL) 2 and  1/2 tabs a day  #270 x 6   Entered and Authorized by:   Clydie Braun MD   Signed by:   Clydie Braun MD on 02/14/2009   Method used:   Print then Give to Patient   RxID:   [id number] RIFAMPIN 300 MG CAPS (RIFAMPIN) 2 each day  #180 x 6   Entered and Authorized by:   Clydie Braun MD   Signed by:   Clydie Braun MD on 02/14/2009   Method used:   Print then Give to Patient   RxID:   [id number] AZITHROMYCIN 250 MG TABS (AZITHROMYCIN) one each day  #90 x 6   Entered and Authorized by:   Clydie Braun MD   Signed by:   Clydie Braun MD on 02/14/2009   Method used:   Print then Give to Patient   RxID:   [id number]

## 2010-08-13 NOTE — Progress Notes (Signed)
Summary: [facility]  [facility]   Imported By: Lanelle Bal 07/19/2008 14:32:43  _____________________________________________________________________  External Attachment:    Type:   Image     Comment:   External Document

## 2010-08-13 NOTE — Progress Notes (Signed)
Summary: [facility]  [facility]   Imported By: Lanelle Bal 07/19/2008 14:30:42  _____________________________________________________________________  External Attachment:    Type:   Image     Comment:   External Document

## 2010-08-13 NOTE — Assessment & Plan Note (Signed)
Summary: F/U/VS   Visit Type:  Follow-up Referring Provider:  Dough Primary Provider:  Dina Sanders  CC:  1 month follow up.  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  I first saw 5/10 in consult for Dr Shelle Iron regarding her intolerances to her MAI therapy.  Jan 5th - stopped abx at last visit and since then has not had any change in sputum production, sob, doe.  Appetite not changed but is eating ok.  Thinks she feels a little better off the abx than on them.    Jun 14 2009. - Doing ok - tolerating meds.  Dry cough persists - non-produciton.  No hemoptysis.  + fatigue. feels depressed. Poor appetite but has not lost wt.  Food not tasting good.  Follow up August 4th 10 in Medicare donut hole and wondering if can stop meds.  Still somewhat short of breath when exertion but able to go one flight of stairs before stoppi, still with dry cough.    Follow up December 26 2008 Since last seen she has tried stopping one med at at time to see if she could tell which one was causing the itching nad diarrhea, n/v. She could not tell which one was problematic so has been back on the meds since 5/22 and tolerating them ok.  Breathing stable but some DOE which is stable.  No cough but some mild tickle in throat - non productive.  No difficulty with vision.  Prior History - seen first 5/10 in ID clinic Dec 09 - Became acutely ill  with productive cough, fevers, V diarrhea, HA   and dxed with double PNA and a UTI.  ? flu.  ruled out for TB. Admitted to [facility] and evaluated for TB and treated with abx.  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   In Jan she was started on Rif, ethambutol and azithromycin since then. Has had intolerance to the meds with rash over last month as well as swelling in legs.  Saw derm who felt it  was due to her norvasc and this was stopped and the swelling improved.  The rash seems to be fading with creams. She is allergic to avelox but not sure of reactio   Preventive Screening-Counseling & Management  Alcohol-Tobacco     Alcohol drinks/day: 0     Smoking Status: never  Caffeine-Diet-Exercise     Caffeine use/day: 2 cups of coffee per day     Does Patient Exercise: yes     Type of exercise: walking when weather permits     Exercise (avg: min/session): <30     Times/week: <3  Safety-Violence-Falls     Seat Belt Use: yes   Updated Prior Medication List: GABAPENTIN 400 MG CAPS (GABAPENTIN) Take 1 tablet by mouth three times a day PREDNISONE 5 MG TABS (PREDNISONE) take 1 1/2 tabs by mouth daily PROZAC 20 MG CAPS (FLUOXETINE HCL) take 1 tab by mouth each morning * CALCIUM 2000 Take 1 tablet by mouth once a day MULTIVITAMINS  CAPS (MULTIPLE VITAMIN) Take 1 tablet by mouth once a day TRAMADOL HCL 50 MG TABS (TRAMADOL HCL) Take 1 tablet by mouth three times a day WELLBUTRIN 100 MG TABS (BUPROPION HCL) Take 1 tab by mouth at bedtime * VITAMIN  D take by mouth daily SYNTHROID 75 MCG TABS (LEVOTHYROXINE SODIUM) one tablet daily AMBIEN 10 MG TABS (ZOLPIDEM TARTRATE) take one by mouth  at bedtime as needed for insomnia  Current Allergies (reviewed today): ! BIAXIN ! * AVELOX ! CODEINE ! 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  followed by [facility] HYPERTENSION (ICD-401.9) ALLERGIC RHINITIS (ICD-477.9) h/o polymyositis-[facility] - dr Forbes Cellar h/o pulmonary nodule-s/p bx with ptx..nondiagnostic 1999    Past Surgical History: Last updated: 07/19/2008 s/p appendectomy, tubal ligation, hysterectomy, gallbladder s/p sinus surgery  Family History: Last updated: 07/19/2008 allergies: mother,  children rheumatism: mother cancer: sister (lymphoma)   Social History: Last updated: 07/19/2008 Patient never smoked.  pt is widowed. pt has children. pt is retired from office work.    Risk Factors: Alcohol Use: 0 (07/18/2009) Caffeine Use: 2 cups of coffee per day (07/18/2009) Exercise: yes (07/18/2009)  Risk Factors: Smoking Status: never (07/18/2009)  Review of Systems       11 systems reviewed and negative except per HPI   Vital Signs:  Patient profile:   76 year old female Menstrual status:  postmenopausal Height:      61 inches (154.94 cm) Weight:      130.5 pounds (59.32 kg) BMI:     24.75 Temp:     97.6 degrees F (36.44 degrees C) oral Pulse rate:   73 / minute BP sitting:   148 / 81  (right arm)  Vitals Entered By: Baxter Hire) (July 18, 2009 10:41 AM) CC: 1 month follow up Is Patient Diabetic? No Pain Assessment Patient in pain? no      Nutritional Status BMI of 19 -24 = normal Nutritional Status Detail appetite has been poor lately  Does patient need assistance? Functional Status Self care Ambulation Normal   Physical Exam  General:  alert and well-developed.   Head:  normocephalic.   Eyes:  vision grossly intact and pupils round.   Mouth:  fair dentition.   Neck:  supple.   Lungs:  normal respiratory effort and normal breath sounds.   Heart:  normal rate and regular rhythm.   Abdomen:  soft, non-tender, and normal bowel sounds.   Msk:  normal ROM and no joint tenderness.   Extremities:  no cce Neurologic:  alert & oriented X3 and cranial nerves II-XII intact.   Additional Exam:  CT addendum dec 2010   The 2.2 x 2.3 cm mass in the right upper lobe presented as a   cavitary mass on the prior CT.  It measured 4.0 x 3.2 cm and   contained an air-fluid level. It is now completely filled with   fluid.     The 1.8 x 2.6 cm mass in the anterior segment of the left upper   lobe was also a cavitary mass  measuring 2.3 x 3.1 cm. It  now   measures as a solid lesion.    The ground-glass attenuation mass in the superior segment of the   left lower lobe measured 2.2 x 2.6 cm and represents a new finding.   It was not present on the prior exam.    The previously noted ground-glass attenuation mass in the superior   segment of the left lower lobe has resolved.  There is also   resolution of the ground-glass attenuation mass like  process in the   superior segment of the right lower lobe.    Prior exam revealed tree-in-bud opacities extensively involving the   right lung.  These have decreased in degree.  Centrilobular nodular   pattern in the right middle lobe has also decreased in degree.    Impression:  Fluid-filled mass in the right upper lobe.  Solid mass   in the left upper lobe.  Ground-glass attenuation mass in the left   lower lobe.    Partial resolution of some of the infectious findings noted on the   prior exam including bronchiolitis with tree-in-bud opacities.   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  .  She had completed almost a years worth of thearpy and was paying 250$ per month since she was in the donut hole. I reviewed her addendum to her chest CT from Dec and the overall appearence is that the  Cant give sputum today sputum cx - negative from prior visits but her cough has been non productive so would be very surprised if it were positive Will continue to hold on abx therapy and repeat CT in 2 months at time of next visit to reassess lung parenchyma and masses.  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   the event in Dec 09 which brought her to the hospital and back into pulmonary care is unrelated to the MAC (except in that the damaged lung parenchyma  predisposes her to further  recurrent PNAs with the more usual bacterial species.   it is worth trying to treat her as long as she can tolerate it with the triple drug therapy.  However I doubt we will make much improvement in her pulm status or CT findings but hopefully we can prevent them from worsening.    Has had an eye exam and suggested to follow up regularly  Orders: Est. Patient Level IV (16109) T-Culture & Smear AFB (87206/[id number])  Orders: T-Comprehensive Metabolic Panel ([id number]) T-CBC w/Diff ([id number])  Orders: Est. Patient Level IV (13086)  Problem # 2:  POLYMYOSITIS (ICD-710.4) cont on pred.  f/u dr Phylliss Bob Orders: Est. 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  She is aware of this but is of course concerned about radiation exposure.  Medications Added to Medication List This Visit: 1)  Ambien 10 Mg Tabs (Zolpidem tartrate) .... Take one by mouth  at bedtime as needed for insomnia  Other Orders: CT with Contrast (CT w/ contrast)  Patient Instructions: 1)  Please schedule a follow-up appointment in 2 month. 2)  Have CT chest on same day as next apptment. 3)  Cal for new or concerning symptoms of worsening cough, fevers, night sweats of weight loss Prescriptions: AMBIEN 10 MG TABS (ZOLPIDEM TARTRATE) take one by mouth  at bedtime as needed for insomnia  #30 x 0   Entered and Authorized by:   Clydie Braun MD   Signed by:   Clydie Braun MD on 07/18/2009   Method used:   Print then Give to Patient   RxID:   [id number]

## 2010-08-13 NOTE — Miscellaneous (Signed)
Summary: HIPAA Restrictions  HIPAA Restrictions   Imported By: Florinda Marker 11/16/2008 16:31:11  _____________________________________________________________________  External Attachment:    Type:   Image     Comment:   External Document

## 2010-08-13 NOTE — Progress Notes (Signed)
Summary: CT request   Phone Note Call from Patient   Summary of Call: Pt called to request CT prior to Jun 14, 2009 OV . She stated there was a discussion with Dr Jessie Foot at the last OV to possbily do it prior to visit.  Her insurance will require pre cert so we will submitt request prior to making appt.  Tomasita Morrow RN  May 17, 2009 11:02 AM  We will call once approval has been cleared. Pt notified Tomasita Morrow RN  May 17, 2009 11:04 AM

## 2010-08-13 NOTE — Assessment & Plan Note (Signed)
Summary: 3 mo f/u dde   Referring Provider:  Dough Primary Provider:  Dina Rich  CC:  3 month follow -up.  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  I first saw 5/10 in consult for Dr Shelle Iron regarding her intolerances to her MAI therapy.   Follow up Jun 14 2009. - Doing ok - tolerating meds.  Dry cough persists - non-produciton.  No hemoptysis.  + fatigue. feels depressed. Poor appetite but has not lost wt.  Food not tasting good.  Follow up August 4th in Medicare donut hole and wondering if can stop meds.  Still somewhat short of breath when exertion but able to go one flight of stairs before stoppi, still with dry cough.    Follow up December 26 2008 Since last seen she has tried stopping one med at at time to see if she could tell which one was causing the itching nad diarrhea, n/v. She could not tell which one was problematic so has been back on the meds since 5/22 and tolerating them ok.  Breathing stable but some DOE which is stable.  No cough but some mild tickle in throat - non productive.  No difficulty with vision.  Prior History - seen first 5/10 in ID clinic Dec 09 - Became acutely ill  with productive cough, fevers, V diarrhea, HA   and dxed with double PNA and a UTI.  ? flu.  ruled out for TB. Admitted to [facility] and evaluated for TB and treated with abx.  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    In Jan she was started on Rif, ethambutol and azithromycin since then. Has had intolerance to the meds with rash over last month as well as swelling in legs.  Saw derm who felt it was due to her norvasc and this was stopped and the swelling improved.  The rash seems to be fading with creams.  She is allergic to avelox but not sure of reaction  May 2010 Still some sob and doe - not back to baseline  since before December.  Cough is non productive exceptNo fevers chills or wt loss.  No night sweats.      Preventive Screening-Counseling & Management  Alcohol-Tobacco     Alcohol drinks/day: 0     Smoking Status: never  Caffeine-Diet-Exercise     Caffeine use/day: 2 cups of coffee per day     Does Patient Exercise: yes     Type of exercise: walking     Exercise (avg: min/session): <30     Times/week: <3  Safety-Violence-Falls     Seat Belt Use: yes   Updated Prior Medication List: GABAPENTIN 400 MG CAPS (GABAPENTIN) Take 1 tablet by mouth three times a day PREDNISONE 5 MG TABS (PREDNISONE) take 1 1/2 tabs by mouth daily PROZAC 20 MG CAPS (FLUOXETINE HCL) take 1 tab by mouth each morning * CALCIUM 2000 Take 1 tablet by mouth once a day MULTIVITAMINS  CAPS (MULTIPLE VITAMIN) Take 1 tablet by mouth once a day TRAMADOL HCL 50 MG TABS (TRAMADOL HCL) Take 1 tablet by mouth three times a day AZITHROMYCIN 250 MG TABS (AZITHROMYCIN) one each day RIFAMPIN 300 MG CAPS (RIFAMPIN) 2 each day ETHAMBUTOL HCL 400 MG TABS (ETHAMBUTOL HCL) 2 and 1/2 tabs  a day WELLBUTRIN 100 MG TABS (BUPROPION HCL) Take 1 tab by mouth at bedtime * VITAMIN D take by mouth daily SYNTHROID 75 MCG TABS (LEVOTHYROXINE SODIUM) one tablet daily  Current Allergies (reviewed today): ! BIAXIN ! * AVELOX ! CODEINE ! AUGMENTIN Review of Systems       11 systems reviewed and negative except per HPI   Vital Signs:  Patient profile:   76 year old female Menstrual status:  postmenopausal Height:      61 inches (154.94 cm) Weight:      131.1 pounds (59.59 kg) BMI:     24.86 Temp:     97.0 degrees F (36.11 degrees C) oral Pulse rate:   75 / minute BP sitting:   134 / 84  (right arm)  Vitals Entered By: Baxter Hire) (June 14, 2009 10:45 AM) CC: 3 month follow -up Is Patient Diabetic? No Pain Assessment Patient in pain? no      Nutritional Status BMI of 19 -24 = normal Nutritional Status Detail  appetite is poor per patient  Have you ever been in a relationship where you felt threatened, hurt or afraid?No   Does patient need assistance? 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  SHe has completed almost a years worth of thearpy.  Still paying 250$ per month since she is in the donut hole.  Her CT read is concerning given the ? mass but when I compare reports the masses do not seem to have changed much.  Discussed with Dr Shelle Iron who will submit CT to be compared. 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   the event in Dec 09 which brought her to the hospital and back into pulmonary care is unrelated to the MAC (except in that the damaged lung parenchyma predisposes her to further  recurrent PNAs with the more usual bacterial species.   it is worth trying to treat her as long as she can tolerate it with the triple drug therapy.  However I doubt we will make much improvement in her pulm status or CT findings but hopefully we can prevent them from worsening.    Has had an eye exam and suggested to follow up regularly  Orders: Est. Patient Level IV (16109) T-Culture & Smear AFB (87206/[id number])  Orders: T-Comprehensive Metabolic Panel ([id number]) T-CBC w/Diff ([id number])  Problem # 2:  POLYMYOSITIS (ICD-710.4) follows with Dr Orson Ape  - last saw oct and remains on prednisone.   Has R shoulder pain and had xray done  but no result  Problem # 3:  SPRAIN&STRAIN OF UNSPECIFIED SITE OF KNEE&LEG (ICD-844.9)  bil shin pain L > r.  Will check Xray   Orders: Diagnostic X-Ray/Fluoroscopy (Diagnostic X-Ray/Flu)  Patient Instructions: 1)  Please schedule a follow-up appointment in 1 month. 2)  Hold the MAI meds for now until you hear from me again. Process Orders Check  Orders Results:     [facility]: Check successful Tests Sent for requisitioning (June 22, 2009 12:24 PM):     06/14/2009: [facility] -- T-Comprehensive Metabolic Panel [[id number]] (signed)     06/14/2009: [facility] -- [facility] w/Diff [[id number]] (signed)

## 2010-08-13 NOTE — Assessment & Plan Note (Signed)
Summary: rov for MAC   Copy to:  Dough Primary Agron Swiney/Referring Jahniah Pallas:  Dina Rich  CC:  Pt is here for a f/u appt.  Pt c/o increased sob with exertion and non-productive cough and occ. mild tightness in chest.  Pt brought CT chest disk with her today to review with the dr.  Pt would like to get a handicap placard. .  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  She is being treated with 3 drug therapy, and is having a few tolerance issues.  She has intermittant loose stools, and recently began to have itching that is being evaluated by a dermatologist.  We have only limited sensitivities for her MAC from [facility], with no synergistic drug sensitivities.  She has minimal cough or mucus, and is maintaining her weight.  She has had a recent chest ct for f/u, and this shows the size of the cavitary nodules are either the same or smaller, but they have a more thickened wall with increased a/f level.  This ct was compared to 2008 and not the most recent in 2010.  I have reviewed that ct, and I think the scattered airspace disease is clearly better, but the cavitary densities haven't changed much.   Current Medications (verified): 1)  Gabapentin 400 Mg Caps (Gabapentin) .... Take 1 Tablet By Mouth Three Times A Day 2)  Prednisone 5 Mg Tabs (Prednisone) .... Take 1 1/2 Tabs By Mouth Daily 3)  Synthroid 50 Mcg Tabs (Levothyroxine Sodium) .... Take 1 Tablet By Mouth Once A Day 4)  Prozac 20 Mg Caps (Fluoxetine Hcl) .... Take 1 Tab By Mouth Each Morning 5)  Calcium 2000 .... Take 1 Tablet By Mouth Once A Day 6)  Multivitamins  Caps (Multiple Vitamin) .... Take 1 Tablet By Mouth Once A Day 7)  Tramadol Hcl 50 Mg Tabs (Tramadol Hcl) .... Take 1 Tablet By Mouth Three Times A Day 8)  Azithromycin 250 Mg Tabs (Azithromycin) .... One Each Day 9)  Rifampin 300 Mg Caps (Rifampin) .... 2 Each Day 10)  Ethambutol Hcl 400 Mg Tabs (Ethambutol Hcl) .... 2 and 1/2  Tabs A Day 11)  Wellbutrin 100 Mg Tabs (Bupropion Hcl) .... Take 1 Tab By Mouth At Bedtime 12)  Vitamin D .... Take By Mouth Daily  Allergies (verified): 1)  ! Biaxin 2)  ! * Avelox 3)  ! Codeine 4)  ! 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  It is unclear if these are not the right combination of meds, or if the cavitary densities could be from something else (doubt).  The airspace disease is better though.  Because the chest ct hasn't had a  dramatic change, and she is having issues with med tolerance, I would like her seen by an infectious disease specialist to help up with drug therapy.  The pt is agreeable.  Medications Added to Medication List This Visit: 1)  Gabapentin 400 Mg Caps (Gabapentin) .... Take 1 tablet by mouth three times a day 2)  Prozac 20 Mg Caps (Fluoxetine hcl) .... Take 1 tab by mouth each morning 3)  Wellbutrin 100 Mg Tabs (Bupropion hcl) .... Take 1 tab by mouth at bedtime 4)  Vitamin D  .... Take by mouth daily  Other Orders: Est. Patient Level III (57322) Infectious Disease Referral (ID)  Patient Instructions: 1)  will refer you to an infectious disease specialist to look at alternative therapies for your disease. 2)  continue on same medicine until that visit

## 2010-08-13 NOTE — Progress Notes (Signed)
Summary: "safe" for pt. to receive flu vaccine?  Phone Note Call from Patient Call back at Home Phone [phone]   Caller: Patient Call For: Clydie Braun MD Reason for Call: Talk to Doctor Summary of Call: Pt. wanting to know if it is "safe" for her to receive the "flu" vaccine.  Please advise.  Jennet Maduro RN  April 11, 2009 9:43 AM   Follow-up for Phone Call        yes, in fact it is very important that she get it. Follow-up by: Clydie Braun MD,  April 11, 2009 10:39 AM     Appended Document: "safe" for pt. to receive flu vaccine? Message lfet for pt. to receive the flu vaccine ASAP

## 2010-08-13 NOTE — Progress Notes (Signed)
Summary: Pt. in Part D "doughnut hole," can she stop antibiotics?  Phone Note Call from Patient Call back at Home Phone [phone]   Caller: Patient Call For: Clydie Braun MD Reason for Call: Talk to Doctor Summary of Call: Pt. is now in the "doughnut hole" with her Medicare Part D.  The three antibiotics will cost her $300/month.   She is wondering whether she needs to be taking all three.  She is not having naseau or diarrhea currently.  Please call her.  Jennet Maduro RN  February 05, 2009 4:03 PM   Follow-up for Phone Call        How loing will she be in the donut hole?  She has completed 7 months of therapy at this point but plan was for at least a year.  If she is only in it for a month or two maybe could hold all abx and then restart but I would like to see he in clinic before making that decision.  Can you schedule her for quick visit next week?  Follow-up by: Clydie Braun MD,  February 06, 2009 12:43 PM  Additional Follow-up for Phone Call Additional follow up Details #1::        The doughnut hole entails an out-of-pocket expense for the pt. = $2500.  Pt. scheduled for Wed., Aug. 4 @ 1545 w/ Dr. Sampson Goon.  Jennet Maduro RN  February 06, 2009 1:56 PM

## 2010-08-13 NOTE — Consult Note (Signed)
Summary: New Pt. Referral : Dr. Marcelyn Bruins  New Pt. Referral : Dr. Marcelyn Bruins   Imported By: Florinda Marker 11/20/2008 15:05:24  _____________________________________________________________________  External Attachment:    Type:   Image     Comment:   External Document

## 2010-08-13 NOTE — Progress Notes (Signed)
Summary: special lab  Phone Note Call from Patient Call back at [phone]   Caller: anne - [facility] in [place] Call For: clance Summary of Call: requesting sensitivies on lab organism per your request for this pt.  Will call w/ results.  [facility] requested, will fax as soon as they have results. Initial call taken by: Eugene Gavia,  July 20, 2008 1:30 PM  Follow-up for Phone Call        called and spoke with frances in the lab at [facility] and verified this is correct.  Aundra Millet Reynolds LPN  July 20, 2008 1:49 PM   Additional Follow-up for Phone Call Additional follow up Details #1::        noted. Additional Follow-up by: Barbaraann Share MD,  July 27, 2008 11:55 AM      Appended Document: special lab received results via fax.  gave results to Dr. Shelle Iron.

## 2010-08-13 NOTE — Assessment & Plan Note (Signed)
Summary: F/U/VS   Referring Provider:  Sol Passer Primary Provider:  Dina Rich  CC:  2 month follow up.  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  I first saw 5/10 in consult for Dr Shelle Iron regarding her intolerances to her MAI therapy.  March 8th 2011 follow up CT shows no change in pulm nodules comp to dec 2010.  Currently feels a little  nauseaous.  Also mild cough.  No fevers chills or productive cough  Jan 5th 2011  - stopped abx at last visit and since then has not had any change in sputum production, sob, doe.  Appetite not changed but is eating ok.  Thinks she feels a little better off the abx than on them.   Jun 14 2009. - Doing ok - tolerating meds.  Dry cough persists - non-produciton.  No hemoptysis.  + fatigue. feels depressed. Poor appetite but has not lost wt.  Food not tasting good.  Follow up August 4th 10 in Medicare donut hole and wondering if can stop meds.  Still somewhat short of breath when exertion but able to go one flight of stairs before stoppi, still with dry cough.    Follow up December 26 2008 SinceShe could not tell which one was problematic so has been back on the meds since 5/22 and tolerating them ok.  Breathing stable but some DOE which is stable.    Prior History - seen first 5/10 in ID clinic Dec 09 - Became acutely ill  with productive cough, fevers, V diarrhea, HA   and dxed with double PNA and a UTI.  ? flu.  ruled out for TB. Admitted to [facility] and evaluated for TB and treated with abx.  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   In Jan she was started on Rif, ethambutol and azithromycin since then. Has had intolerance to the meds with rash over last month as well as swelling in legs.  Saw derm who felt it was due to her norvasc and this was stopped and the swelling  improved.  The rash seems to be fading with creams. She is allergic to avelox but not sure of reaction  family physician   Preventive Screening-Counseling & Management  Alcohol-Tobacco     Alcohol drinks/day: 0     Smoking Status: never  Caffeine-Diet-Exercise     Caffeine use/day: 2 cups of coffee per day     Does Patient Exercise: yes     Type of exercise: walking when weather permits     Exercise (avg: min/session): <30     Times/week: <3  Safety-Violence-Falls     Seat Belt Use: yes   Updated Prior Medication List: GABAPENTIN 400 MG CAPS (GABAPENTIN) Take 1 tablet by mouth three times a day PREDNISONE 5 MG TABS (PREDNISONE) take 1 1/2 tabs by mouth daily PROZAC 20 MG CAPS (FLUOXETINE HCL) take 1 tab by mouth each morning * CALCIUM 2000 Take 1 tablet by mouth once a day MULTIVITAMINS  CAPS (MULTIPLE VITAMIN) Take 1 tablet by mouth once a day TRAMADOL HCL 50 MG TABS (TRAMADOL HCL) Take 1 tablet by mouth three times a day WELLBUTRIN 100 MG TABS (BUPROPION HCL) Take 1 tab by mouth at bedtime * VITAMIN D take by mouth daily SYNTHROID 75  MCG TABS (LEVOTHYROXINE SODIUM) one tablet daily AMBIEN 10 MG TABS (ZOLPIDEM TARTRATE) take one by mouth  at bedtime as needed for insomnia  Current Allergies (reviewed today): ! BIAXIN ! * AVELOX ! CODEINE ! 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  followed by [facility] HYPERTENSION (ICD-401.9) ALLERGIC RHINITIS (ICD-477.9) h/o polymyositis-[facility] - dr Forbes Cellar h/o pulmonary nodule-s/p bx with ptx..nondiagnostic 1999    Past Surgical History: Last updated: 07/19/2008 s/p appendectomy, tubal ligation, hysterectomy, gallbladder s/p sinus surgery  Family History: Last updated: 07/19/2008 allergies: mother, children rheumatism: mother cancer:  sister (lymphoma)   Social History: Last updated: 07/19/2008 Patient never smoked.  pt is widowed. pt has children. pt is retired from office work.    Risk Factors: Alcohol Use: 0 (09/18/2009) Caffeine Use: 2 cups of coffee per day (09/18/2009) Exercise: yes (09/18/2009)  Risk Factors: Smoking Status: never (09/18/2009)  Review of Systems       11 systems reviewed and negative except per HPI   Vital Signs:  Patient profile:   76 year old female Menstrual status:  postmenopausal Height:      61 inches (154.94 cm) Weight:      128.4 pounds (58.36 kg) BMI:     24.35 Temp:     97.0 degrees F (36.11 degrees C) oral Pulse rate:   92 / minute BP sitting:   158 / 89  (right arm)  Vitals Entered By: Baxter Hire) (September 18, 2009 2:49 PM) CC: 2 month follow up Pain Assessment Patient in pain? no      Nutritional Status BMI of 19 -24 = normal Nutritional Status Detail appetite is poor per patient  Does patient need assistance? Functional Status Self care Ambulation Normal   Physical Exam  Additional Exam:  Findings: Dominant spiculated nodule in the right upper lobe is mildly decreased in size, now measuring 2.0 x 2.1 cm compared to 2.2 x 2.3 cm on prior study.  Dominant left lung nodule in the lingula has not significantly changed in size measuring 1.8 x 2.5 cm.  Other smaller bilateral pulmonary nodules remains stable. Some of these prior densely calcified consistent with old granulomatous disease.  An area of ground-glass opacity seen in the left lower lobe on prior study has now resolved.  No new or enlarging pulmonary nodules or masses are identified.  There is no evidence of acute infiltrate.   There is no evidence of pleural or pericardial effusion.  There is no evidence of hilar or mediastinal masses.  No adenopathy identified within the thorax.  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   IMPRESSION:     1. Interval stability of bilateral pulmonary nodules, with mild decrease in size of dominant nodule in the right upper lobe and interval resolution of left lower lobe ground-glass opacity. 2.  No new or progressive disease in the thorax.     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  S/p almost one year worth of thearpy and was paying 250$ per month since she was in the donut hole.  Her CT from today compared to 12/201 shows no progression of the pulm nodules noted and no worsening since being off the AFB meds since december -  3 months.  At this point will follow clinically for recurrence of symptoms.  If she has severe pna, progressive cough or sputum production or dcelin in pulm function she should have reevalation to see if MAI is the underlying cause.    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   the event in Dec 09 which brought her to the hospital and back into pulmonary care is unrelated to the MAC (except in that the damaged lung parenchyma predisposes her to further  recurrent PNAs with the more usual bacterial species.   it is worth trying to treat her as long as she can tolerate it with the triple drug therapy.  However I doubt we will make much improvement in her pulm status or CT findings but hopefully we can prevent them from worsening.    Orders: Est. 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  Will follow up with her PCP as needed.  Orders: Est. Patient Level IV (60454)  Patient Instructions: 1)  Follow up as needed for new or concerning symptoms. 2)  If new symptoms or recurrent Pneumonia call our clinic to reschedule a visit.

## 2010-08-13 NOTE — Progress Notes (Signed)
Summary: referral  Phone Note Call from Patient   Caller: Patient Call For: clance Summary of Call: calling about referral to see infection specialist Initial call taken by: Rickard Patience,  November 07, 2008 2:31 PM  Follow-up for Phone Call        [facility] referral was made on 11/02/08.  Pt calling back hasn't heard from them.  Please advise. Thanks Follow-up by: Cloyde Reams RN,  November 07, 2008 2:49 PM  Additional Follow-up for Phone Call Additional follow up Details #1::        spoke to pt , she is aware records have been faxed to ID and they will be calling her  Additional Follow-up by: Oneita Jolly,  November 08, 2008 10:51 AM

## 2010-08-13 NOTE — Assessment & Plan Note (Signed)
Summary: rov for MAC   PCP:  Dina Rich  Chief Complaint:  4 week Mycobacteria follow-up with CXR. Pt states there are no changes in cough or mucus production.Marland Kitchen  History of Present Illness: the pt comes in today for f/u of her abnormal cxr with cavitating densities bilat.  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  She is doing better with the meds, but initially had issues with nausea.  She has gained 3 pounds since the last visit, and denies any other constitutional symptoms.  She has had a f/u cxr today which shows no pna, but no big change in her cavitary densities.  Her sensitivites from [place] are extremely limited, but do show susceptibility for clarithro, and at least intermediate for ethambutol and rifampin.  They do not provide drug combo sensitivities.    Prior Medications Reviewed Using: Patient Recall  Updated Prior Medication List: GABAPENTIN 400 MG CAPS (GABAPENTIN) Take 1 tablet by mouth four times daily PREDNISONE 5 MG TABS (PREDNISONE) take 1 1/2 tabs by mouth daily SYNTHROID 50 MCG TABS (LEVOTHYROXINE SODIUM) Take 1 tablet by mouth once a day PROZAC 20 MG CAPS (FLUOXETINE HCL) 1 by mouth two times a day AMLODIPINE BESYLATE 5 MG TABS (AMLODIPINE BESYLATE) Take 1 tablet by mouth once a day * CALCIUM 2000 Take 1 tablet by mouth once a day MULTIVITAMINS  CAPS (MULTIPLE VITAMIN) Take 1 tablet by mouth once a day TRAMADOL HCL 50 MG TABS (TRAMADOL HCL) Take 1 tablet by mouth three times a day AZITHROMYCIN 250 MG TABS (AZITHROMYCIN) one each day RIFAMPIN 300 MG CAPS (RIFAMPIN) 2 each day ETHAMBUTOL HCL 400 MG TABS (ETHAMBUTOL HCL) 2 and 1/2 tabs a day  Current Allergies (reviewed today): ! BIAXIN ! * AVELOX ! CODEINE ! 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  I am encouraged by the weight gain, and also lack of pulmonary and constitutional symptoms.  I think she needs to continue on her current meds, and then repeat a ct chest in another 6wks.  Medications Added to Medication List This Visit: 1)  Gabapentin 400 Mg Caps (Gabapentin) .... Take 1 tablet by mouth four times daily 2)  Prozac 20 Mg Caps (Fluoxetine hcl) .Marland Kitchen.. 1 by mouth two times a day  Patient Instructions: 1)  stay on current therapy 2)  will check repeat ct chest in 6weeks 3)  be sure and see ophthamologist in about 4 weeks to check eyes with ethambutol therapy 4)  liver function studies not required  5)  I will call you with results of ct chest.

## 2010-08-13 NOTE — Letter (Signed)
Summary: [facility]   Imported By: Lanelle Bal 07/19/2008 14:34:34  _____________________________________________________________________  External Attachment:    Type:   Image     Comment:   External Document

## 2010-08-13 NOTE — Assessment & Plan Note (Signed)
Summary: new pt Linda Sanders   Visit Type:  Consult Referring Provider:  Dough Primary Provider:  Dina Rich  CC:  new patient MAC.  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    Dec 09 - Became acutely ill  with productive cough, fevers, V diarrhea, HA   and dxed with double PNA and a UTI.  ? flu.  ruled out for TB. Admitted to [facility] and evaluated for TB and treated with abx.  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    In Jan she was started on Rif, ethambutol and azithromycin since then. Has had some intolerance to the meds with rash over last month as well as swelling in legs.  Saw derm who felt it was due to her norvasc and this was stopped and the swelling improved.  The rash seems to be fading with creams. She also reports some diarrhea since starting the meds.  Her bowels are loose at least 3x every day.  No blood.  Some crampy abd pain.   She is allergic to avelox but not sure of reaction  Still some sob and doe - not back to baseline since before December.  Cough is non productive except some PND in morning.  No fevers chills or wt loss.  No night sweats.    Since starting the meds she has not really improved but does feel a little stronger with each day.  Her maincomplaints is the side effects   Preventive Screening-Counseling & Management     Alcohol drinks/day: 0     Smoking Status: never   Prior Medication List:  GABAPENTIN 400 MG CAPS (GABAPENTIN) Take 1 tablet by mouth three times a day PREDNISONE 5 MG TABS (PREDNISONE) take 1 1/2 tabs by mouth daily SYNTHROID 50 MCG TABS (LEVOTHYROXINE SODIUM) Take 1 tablet by mouth once a day PROZAC 20 MG CAPS (FLUOXETINE HCL) take 1 tab by mouth each morning * CALCIUM 2000 Take 1 tablet by mouth once a day MULTIVITAMINS  CAPS (MULTIPLE  VITAMIN) Take 1 tablet by mouth once a day TRAMADOL HCL 50 MG TABS (TRAMADOL HCL) Take 1 tablet by mouth three times a day AZITHROMYCIN 250 MG TABS (AZITHROMYCIN) one each day RIFAMPIN 300 MG CAPS (RIFAMPIN) 2 each day ETHAMBUTOL HCL 400 MG TABS (ETHAMBUTOL HCL) 2 and 1/2 tabs a day WELLBUTRIN 100 MG TABS (BUPROPION HCL) Take 1 tab by mouth at bedtime * VITAMIN D take by mouth daily   Current Allergies: ! BIAXIN ! * AVELOX ! CODEINE ! AUGMENTIN Past History:  Past Surgical History:    s/p appendectomy, tubal ligation, hysterectomy, gallbladder    s/p sinus surgery (07/19/2008)  Family History:    allergies: mother, children    rheumatism: mother    cancer: sister (lymphoma)  (07/19/2008)  Social History:    Patient never smoked.     pt is widowed.    pt has children.    pt is retired from office work.   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  followed by [facility]    HYPERTENSION (ICD-401.9)    ALLERGIC RHINITIS (ICD-477.9)    h/o polymyositis-[facility] - dr Forbes Cellar    h/o pulmonary nodule-s/p bx with ptx..nondiagnostic 1999       Additional History    Menstrual Status:  postmenopausal  Review of Systems       11 systems reviewed and negative except per HPI   Vital Signs:  Patient profile:   76 year old female Menstrual status:  postmenopausal Height:      61 inches (154.94 cm) Weight:      135.19 pounds (61.45 kg) BMI:     25.64 Pulse rate:   77 / minute BP sitting:   144 / 88  (right arm)  Vitals  Entered By: Starleen Arms CMA (Nov 16, 2008 3:34 PM)  Nutrition Counseling: Patient's BMI is greater than 25 and therefore counseled on weight management options. CC: new patient MAC Is Patient Diabetic? No Pain Assessment Patient in pain? no      Nutritional Status BMI of 25 - 29 = overweight Nutritional Status Detail not eating well  Have you ever been in a relationship where you felt threatened, hurt or afraid?No   Does patient need assistance? Functional Status Self care Ambulation Normal  years   days  Menstrual Status postmenopausal   Physical Exam  General:  nadalert and well-developed.   Head:  normocephalic.   Eyes:  vision grossly intact, pupils equal, pupils round, and pupils reactive to light.   Mouth:  good dentition.   Neck:  supple.   Lungs:  normal respiratory effort.  bil crackles mild, good air movement Heart:  normal rate, regular rhythm, and no murmur.   Abdomen:  soft, non-tender, and normal bowel sounds.   Msk:  normal ROM and no joint tenderness.   Extremities:  trace bil pedal edema Neurologic:  alert & oriented X3, cranial nerves II-XII intact, and strength normal in all extremities.   Skin:  no rashes.   Cervical Nodes:  no anterior cervical adenopathy and no posterior cervical adenopathy.   Psych:  Oriented X3 and memory intact for recent and remote.     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  I think that the event in Dec 09 which brought her to the hospital and back into pulmonary care is unrelated to the MAC (except in that the damaged lung parenchyma predisposes her to further  recurrent PNAs with the more usual bacterial species.   I agree with Dr Shelle Iron that it is worth trying to treat her as long as she can tolerate it with the triple drug therapy.  However I doubt we  will make much improvement in her pulm status or CT findings but hopefully we can prevent them from worsening.  Unfortunately she is having some side effects (although they seem to be tolerable per my discussion with the pt).  THe swelling in her legs and rash she had is resolving after  stopping her norvasc so I do not think these are related to the MAC meds. We discussed her options at length including the possible substitution of avelox for one of the other meds (although she is listed as being allergic to avelox she is unclear what the reaction was so it is likely safe to rechallenge her if needed). For now we will have her stop each of the 3 meds in turn one at a time and keep a diary to see if stopping any of them makes her sxs of diarrhea resolve.  If we can ID which one is the culprit we can possibly substitute the avelox for this (although I have to first review her sensitivity MIC results with pharmacy to ensure susceptibility). We have also asked her to give a stool sample for C diff at [facility] when possible although I think this is unlikely. I will see the patient back in 3 weeks time to review her signs diary and decide on changing her meds.     Has had an eye exam and suggested to follow up regularly Orders: Consultation Level IV (16109)  Other Orders: T-Clostridium difficile Toxin A/B ([id number])  Patient Instructions: 1)  Please schedule a follow-up appointment in 5 weeks 2)  Stop one antibiotic at a time for 5 days each and record how you feel to help elucidate which one is causing your symptoms.  Appended Document: new pt Linda Sanders Other options for her would be to change to 3x a week therapy with azithro, rifampin and ethambutol to decrease intolerances.  Will check sputum at next visit. Duration will need to be 12 mos after negative sputum or a total of 18-24 mos most likely

## 2010-11-20 ENCOUNTER — Ambulatory Visit: Payer: Self-pay | Admitting: Infectious Disease

## 2010-11-26 ENCOUNTER — Ambulatory Visit (INDEPENDENT_AMBULATORY_CARE_PROVIDER_SITE_OTHER): Payer: MEDICARE | Admitting: Infectious Disease

## 2010-11-26 ENCOUNTER — Encounter: Payer: Self-pay | Admitting: Infectious Disease

## 2010-11-26 DIAGNOSIS — R0789 Other chest pain: Secondary | ICD-10-CM | POA: Insufficient documentation

## 2010-11-26 DIAGNOSIS — A310 Pulmonary mycobacterial infection: Secondary | ICD-10-CM

## 2010-11-26 DIAGNOSIS — R918 Other nonspecific abnormal finding of lung field: Secondary | ICD-10-CM

## 2010-11-26 NOTE — Assessment & Plan Note (Signed)
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 If she develops fevers weight loss or other systemic symptoms or worsening cough then I will certainly recheck her sputum and reinstitute therapy.

## 2010-11-26 NOTE — Assessment & Plan Note (Signed)
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 However certainly could be due to coronary artery disease which is possible given the patient's age and some of her risk factors. However she refused workup in my office have asked her to talk to her primary care physician about these symptoms. They are chronic in nature never gone for many months. She may benefit at a minimum from a nuclear medicine stress test versus cardiac catheterization.

## 2010-11-26 NOTE — Progress Notes (Signed)
Subjective:    Patient ID: Linda Sanders, female    DOB: 25-Jul-1934, 76 y.o.   MRN: [id number]  HPI 76 yo with history of polymyositis followed by rheumatology who has had several PNA throuhout her life and was dxed with  RML syndrome years ago an dhad cx  postive for MAC in Dec 09. She was then followed by mild partner Dr. Sampson Goon in May 2010. She completed more than a year of therapy it appears. She did have difficulty with tolerance and was taken off of her anti-mycobacterium avium therapy last in December of 2011. She returns today for followup. She states that she does not have any weight loss but in fact is having weight gain. She has no fevers. She doesn't endorse a chronic nonproductive cough but she has had this for years. She also continues to suffer from dyspnea on exertion but again this is chronic. She is quite concerned that she may have pulmonary fibrosis. She did have evidence of fibrosis on her CT scan and I reviewed these with her in serial order. I advised that she also get plugged into a pulmonary physician again and she was being followed by Dr. Deloris Ping and the bowel or medicine. Prior that she did follow Dr. Brendolyn Patty at [place] but she apparently did not have a good experience and the end and is adamant about not seeing any more. On an not hearing her review of systems she endorsed some chest tightness that she has even as she sits in the clinic. She states she has this at it with exertion typically and that improves with rest it is 5/10 in severity as a tightness. It does not radiate to her neck or back or jaw. It is not accompanied by nausea or diaphoresis. She has had this for many months. Asked her if she ever had a cardiac catheterization or have a stress test and she stated that she had either. She is skeptical this involves her heart and certainly it may not involve her heart she refuses further testing for cardiac causes this point in time. I have asked her that at minimum  that she informed her primary care physician know about her symptoms I spent greater than an hour the patient grid including greater than 50% of the time in counseling the patient and coordinating her care. She is agreeable to recheck with me in one years time. I have recommended again that she see a pulmonary physician  Dr. Sol Passer   Review of Systems  Constitutional: Negative for chills, diaphoresis, activity change, appetite change and unexpected weight change.  HENT: Negative for facial swelling, neck pain and neck stiffness.   Eyes: Negative for photophobia, discharge, itching and visual disturbance.  Respiratory: Positive for cough, chest tightness and shortness of breath. Negative for apnea, choking, wheezing and stridor.   Cardiovascular: Positive for chest pain. Negative for palpitations and leg swelling.  Gastrointestinal: Negative for abdominal pain, constipation, blood in stool, abdominal distention and anal bleeding.  Genitourinary: Negative for dysuria and difficulty urinating.  Musculoskeletal: Positive for joint swelling. Negative for back pain, arthralgias and gait problem.  Skin: Negative for color change and pallor.  Neurological: Negative for dizziness, facial asymmetry, light-headedness and headaches.  Hematological: Negative for adenopathy. Does not bruise/bleed easily.  Psychiatric/Behavioral: Negative for hallucinations, behavioral problems, confusion, dysphoric mood, decreased concentration and agitation.       Objective:   Physical Exam  Constitutional: She is oriented to person, place, and time. She appears well-developed and well-nourished.  No distress.  HENT:  Head: Normocephalic and atraumatic.  Nose: Nose normal.  Mouth/Throat: Oropharynx is clear and moist. No oropharyngeal exudate.  Eyes: Conjunctivae and EOM are normal. Pupils are equal, round, and reactive to light. No scleral icterus.  Neck: Normal range of motion. Neck supple.  Cardiovascular: Normal  rate, regular rhythm and normal heart sounds.  Exam reveals no gallop and no friction rub.   No murmur heard. Pulmonary/Chest: No respiratory distress. She has no wheezes. She has rales. She exhibits no tenderness.       Rales heard at the right lower lung field posteriorly.  Abdominal: She exhibits no distension and no mass. There is no tenderness. There is no rebound and no guarding.  Musculoskeletal: She exhibits no edema and no tenderness.  Lymphadenopathy:    She has no cervical adenopathy.  Neurological: She is alert and oriented to person, place, and time. Coordination normal.  Skin: Skin is warm and dry. No rash noted. She is not diaphoretic. No erythema. No pallor.  Psychiatric: She has a normal mood and affect. Her behavior is normal. Judgment and thought content normal.          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 However certainly could be due to coronary artery disease which is possible given the patient's age and some of her risk factors. However she refused workup in my office have asked her to talk to her primary care physician about these symptoms. They are chronic in nature never gone for many months. She may benefit at a minimum from a nuclear medicine stress test versus cardiac catheterization.  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 If she develops fevers weight loss or other systemic symptoms or worsening cough then I will certainly recheck her sputum and reinstitute therapy.  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 With regards to management of the chronic fibrotic changes I really need to defer to a prominent pulmonary physician for this. I have asked the patient to please get plugged back into a pulmonary physician.

## 2010-11-26 NOTE — Assessment & Plan Note (Signed)
These are likely due to a mycotic M. avium. With regards to management of the chronic fibrotic changes I really need to defer to a prominent pulmonary physician for this. I have asked the patient to please get plugged back into a pulmonary physician.

## 2011-04-14 ENCOUNTER — Encounter: Payer: Self-pay | Admitting: Pulmonary Disease

## 2011-04-15 ENCOUNTER — Institutional Professional Consult (permissible substitution): Payer: MEDICARE | Admitting: Pulmonary Disease

## 2011-04-15 ENCOUNTER — Ambulatory Visit (INDEPENDENT_AMBULATORY_CARE_PROVIDER_SITE_OTHER): Payer: Medicare Other | Admitting: Pulmonary Disease

## 2011-04-15 ENCOUNTER — Encounter: Payer: Self-pay | Admitting: Pulmonary Disease

## 2011-04-15 VITALS — BP 110/72 | HR 85 | Temp 98.4°F | Ht 62.0 in | Wt 132.2 lb

## 2011-04-15 DIAGNOSIS — A310 Pulmonary mycobacterial infection: Secondary | ICD-10-CM

## 2011-04-15 DIAGNOSIS — R918 Other nonspecific abnormal finding of lung field: Secondary | ICD-10-CM

## 2011-04-15 NOTE — Progress Notes (Signed)
  Subjective:    Patient ID: Linda Sanders, female    DOB: 03/03/1935, 76 y.o.   MRN: [id number]  HPI The patient comes in today for an acute sick visit.  She has not been seen since spring of 2010, and has known history of MAC infection.  She was treated with multidrug therapy for most of 2010 and 2011, and has done fairly well since being off medications starting in December of 2011.  Most recently she has seen worsening of her symptoms with increased cough, thick white sputum but no purulence, some chest congestion, as well as decreased appetite and mild weight loss.  She has taken her temperature and found it to be as high as 99.9 at its worse.  She does describe subjective fevers.  She had a recent followup chest x-ray last month which actually showed significant improvement in her cavitary lung disease compared to the prior.  Her prior cavities had become scarlike nodules.  She has no significant interstitial disease on her x-rays.   Review of Systems  Constitutional: Positive for fever. Negative for unexpected weight change.  HENT: Positive for ear pain, congestion, rhinorrhea and postnasal drip. Negative for nosebleeds, sore throat, sneezing, trouble swallowing, dental problem and sinus pressure.   Eyes: Positive for redness and itching.  Respiratory: Positive for cough, chest tightness, shortness of breath and wheezing.   Cardiovascular: Negative for palpitations and leg swelling.  Gastrointestinal: Negative for nausea and vomiting.  Genitourinary: Negative for dysuria.  Musculoskeletal: Negative for joint swelling.  Skin: Negative for rash.  Neurological: Positive for headaches.  Hematological: Does not bruise/bleed easily.  Psychiatric/Behavioral: Negative for dysphoric mood. The patient is not nervous/anxious.        Objective:   Physical Exam Well-developed female in no acute distress Nose without purulence or discharge noted Oropharynx clear Chest with a rare crackle, no  wheezes or rhonchi Cardiac exam with regular rate and rhythm Lower extremities with no significant edema, no cyanosis noted Alert and oriented, moves all 4 extremities.       Assessment & Plan:

## 2011-04-15 NOTE — Patient Instructions (Signed)
Will give you a sputum cup to collect an AM specimen first thing.  Please keep in fridge until brought to lab in [place], but try to get here in 3 hrs or less. Once you deliver sputum specimen, please call to let us know and will call in an antibiotic to cover a possible bacterial infection. Will arrange followup once sputum culture returns.

## 2011-04-15 NOTE — Assessment & Plan Note (Signed)
The patient is having increased pulmonary symptoms, as well as some constitutional symptoms, this always raises concern for a recurrence of her MAC.  She is not bringing up any purulence, is not having any hemoptysis, and her chest x-ray is actually greatly improved from the prior.  I wonder if this is just simply a bacterial acute bronchitis.  I think it is very important to reculture her sputum, and see if there is a predominant bacteria or mycobacterial species.  Once her sputum is collected, I will empirically start her on an antibiotic for a presumed bacterial infection.

## 2011-04-18 ENCOUNTER — Telehealth: Payer: Self-pay | Admitting: Pulmonary Disease

## 2011-04-18 NOTE — Telephone Encounter (Signed)
Pt states she does not feel she will be able to get to our lab in time if she can produce a sample but can get to [facility]. She has not gotten any up yet. Please advise if okay with same. Thanks.

## 2011-04-18 NOTE — Telephone Encounter (Signed)
Can we even order anything from [facility], and if so, how do we do it.

## 2011-04-18 NOTE — Telephone Encounter (Signed)
NOTE: PT DOES NOT HAVE A SAMPLE TO PROVIDE YET. Linda Sanders

## 2011-04-18 NOTE — Telephone Encounter (Signed)
I spoke with pt and is aware she can go over to [facility] and drop off her sputum. She states she would like to go to the one on [address] in [place]. I have wrote labs down and is awaiting for [facility] signature to fax over to them. I called [facility] and the fax # is [phone] to send the order to. Will await order sig to fax.

## 2011-04-18 NOTE — Telephone Encounter (Signed)
rx signed and faxed to [facility].

## 2013-01-29 ENCOUNTER — Emergency Department (EMERGENCY_DEPARTMENT_HOSPITAL): Payer: Medicare Other

## 2013-01-29 ENCOUNTER — Emergency Department (EMERGENCY_DEPARTMENT_HOSPITAL): Admission: EM | Admit: 2013-01-29 | Discharge: 2013-01-29 | Payer: Medicare Other

## 2013-01-29 DIAGNOSIS — M79609 Pain in unspecified limb: Secondary | ICD-10-CM

## 2013-01-29 DIAGNOSIS — S8010XA Contusion of unspecified lower leg, initial encounter: Secondary | ICD-10-CM

## 2013-10-15 ENCOUNTER — Emergency Department (EMERGENCY_DEPARTMENT_HOSPITAL)
Admission: EM | Admit: 2013-10-15 | Discharge: 2013-10-15 | Payer: Medicare Other | Attending: Emergency Medicine | Admitting: Emergency Medicine

## 2013-10-15 ENCOUNTER — Emergency Department (EMERGENCY_DEPARTMENT_HOSPITAL): Payer: Medicare Other

## 2013-10-15 DIAGNOSIS — R110 Nausea: Secondary | ICD-10-CM

## 2013-10-15 DIAGNOSIS — R1012 Left upper quadrant pain: Secondary | ICD-10-CM

## 2014-03-21 ENCOUNTER — Telehealth: Payer: Self-pay | Admitting: Licensed Clinical Social Worker

## 2014-03-21 NOTE — Telephone Encounter (Signed)
Patient states that normally she has to have a CT of her chest to evaluate her Mycobacterium of her lungs. She wanted to know if she could have on set up for on the same day she comes to see Dr. Baxter Flattery. I advised her that I could not answer that question without her being seen and without talking to Dr. Baxter Flattery. Transportation is of concern since she lives in [place].  I will forward to Dr. Baxter Flattery.

## 2014-03-27 NOTE — Telephone Encounter (Signed)
Sure. Wynn Banker if need to

## 2014-03-28 ENCOUNTER — Ambulatory Visit (INDEPENDENT_AMBULATORY_CARE_PROVIDER_SITE_OTHER): Payer: Medicare Other | Admitting: Internal Medicine

## 2014-03-28 ENCOUNTER — Encounter: Payer: Self-pay | Admitting: Internal Medicine

## 2014-03-28 VITALS — BP 129/76 | HR 65 | Temp 98.2°F | Wt 135.0 lb

## 2014-03-28 DIAGNOSIS — A310 Pulmonary mycobacterial infection: Secondary | ICD-10-CM

## 2014-03-28 DIAGNOSIS — R053 Chronic cough: Secondary | ICD-10-CM

## 2014-03-28 DIAGNOSIS — R059 Cough, unspecified: Secondary | ICD-10-CM

## 2014-03-28 DIAGNOSIS — R05 Cough: Secondary | ICD-10-CM

## 2014-03-28 MED ORDER — BENZONATATE 200 MG PO CAPS: 200.0000 mg | ORAL_CAPSULE | Freq: Three times a day (TID) | ORAL | Status: DC | PRN

## 2014-03-28 NOTE — Progress Notes (Signed)
Subjective:    Patient ID: Linda Sanders, female    DOB: 30-Mar-1935, 79 y.o.   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 She was treated with multidrug therapy for greater than a year. She did have difficulty with tolerance and was taken off of her anti-mycobacterium avium therapy  in December of 2011. Has been doing well up until recently with worsening of increased cough, thick white sputum but no purulence, chest congestion, with decreased appetite. she has subjective fevers and worsening fatigue. She was referred by Dr. Gwenette Greet to see if need to reinitiate treatment  Allergies  Allergen Reactions  . Amoxicillin-Pot Clavulanate   . Avelox [Moxifloxacin Hcl In Nacl]   . Clarithromycin   . Clarithromycin   . Codeine   . Cymbalta [Duloxetine Hcl]   . Metronidazole And Related   . Moxifloxacin    Current Outpatient Prescriptions on File Prior to Visit  Medication Sig Dispense Refill  . Cholecalciferol (VITAMIN D3) 50000 UNITS CAPS Take 1 capsule by mouth once a week.        . gabapentin (NEURONTIN) 400 MG capsule Take 400 mg by mouth 3 (three) times daily.       Marland Kitchen levothyroxine (SYNTHROID, LEVOTHROID) 50 MCG tablet Take 50 mcg by mouth daily.        . Multiple Vitamin (MULTIVITAMIN) capsule Take 1 capsule by mouth daily.        . pantoprazole (PROTONIX) 20 MG tablet Take 20 mg by mouth daily.        . polyethylene glycol (MIRALAX / GLYCOLAX) packet Take 17 g by mouth daily.        . predniSONE (DELTASONE) 5 MG tablet Take 5 mg by mouth daily.       . traMADol (ULTRAM) 50 MG tablet Take 50 mg by mouth 3 (three) times daily.       Marland Kitchen zolpidem (AMBIEN) 10 MG tablet Take 10 mg by mouth at bedtime as needed.         No current facility-administered medications on file prior to visit.   Active Ambulatory Problems    Diagnosis Date Noted  . 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 HYPERTENSION 07/19/2008  . ALLERGIC  RHINITIS 07/19/2008  . POLYMYOSITIS 12/26/2008  . SPRAIN&STRAIN OF UNSPECIFIED SITE OF KNEE&LEG 06/14/2009  . ADENOCARCINOMA, BREAST, HX OF 07/19/2008  . Chest tightness 11/26/2010  . Pulmonary nodules/lesions, multiple 11/26/2010   Resolved Ambulatory Problems    Diagnosis Date Noted  . BACTERIAL PNEUMONIA 07/19/2008  . MASS, LUNG 07/18/2009  . Nonspecific (abnormal) findings on radiological and other examination of body structure 07/19/2008  . ABNORMAL CHEST XRAY 07/19/2008   Past Medical History  Diagnosis Date  . Adenocarcinoma, breast   . Hypertension   . Allergic rhinitis   . Pulmonary nodule    History  Substance Use Topics  . Smoking status: Never Smoker   . Smokeless tobacco: Not on file  . Alcohol Use: No  family history includes Allergies in her mother and other; Lymphoma in her sister; Rheum arthritis in her mother. Review of Systems Review of Systems  Constitutional: Negative for fever, chills, diaphoresis, activity change, appetite change, fatigue and unexpected weight change.  HENT: Negative for congestion, sore throat, rhinorrhea, sneezing, trouble swallowing and sinus pressure.  Eyes: Negative for photophobia and visual disturbance.  Respiratory: + for cough, chest tightness, shortness of breath, wheezing and stridor.  Cardiovascular: Negative for chest  pain, palpitations and leg swelling.  Gastrointestinal: Negative for nausea, vomiting, abdominal pain, diarrhea, constipation, blood in stool, abdominal distention and anal bleeding.  Genitourinary: Negative for dysuria, hematuria, flank pain and difficulty urinating.  Musculoskeletal: Negative for myalgias, back pain, joint swelling, arthralgias and gait problem.  Skin: Negative for color change, pallor, rash and wound.  Neurological: Negative for dizziness, tremors, weakness and light-headedness.  Hematological: Negative for adenopathy. Does not bruise/bleed easily.  Psychiatric/Behavioral: Negative for  behavioral problems, confusion, sleep disturbance, dysphoric mood, decreased concentration and agitation.       Objective:   Physical Exam BP 129/76  Pulse 65  Temp(Src) 98.2 F (36.8 C) (Oral)  Wt 135 lb (61.236 kg) Physical Exam  Constitutional:  oriented to person, place, and time. appears well-developed and well-nourished. No distress.  HENT:  Mouth/Throat: Oropharynx is clear and moist. No oropharyngeal exudate.  Cardiovascular: Normal rate, regular rhythm and normal heart sounds. Exam reveals no gallop and no friction rub.  No murmur heard.  Pulmonary/Chest: Effort normal and breath sounds normal. No respiratory distress.  has no wheezes.  Abdominal: Soft. Bowel sounds are normal.  exhibits no distension. There is no tenderness.  Lymphadenopathy: no cervical adenopathy.  Neurological: alert and oriented to person, place, and time.  Skin: Skin is warm and dry. No rash noted. No erythema.  Psychiatric: a normal mood and affect. His behavior is normal.         Assessment & Plan:  Pulmonary mac = will attempt to get sputum culture. Also repeat chest ct  Cough = will give tessalon perles and mucinex  rtc in 4-5 wk to decide whether to reinitiate treatment

## 2014-04-06 ENCOUNTER — Telehealth: Payer: Self-pay | Admitting: *Deleted

## 2014-04-06 NOTE — Telephone Encounter (Signed)
Called the patient to give her an appt for her CT chest. Per patient request appt made at [facility] for Monday 04/10/14 at 330 pm. Patient given the information and ok with the appt. Number to Radiology is [phone] once the Prior Auth goes through call and give them the number.

## 2014-04-10 ENCOUNTER — Ambulatory Visit (HOSPITAL_COMMUNITY): Admission: RE | Admit: 2014-04-10 | Payer: Medicare Other | Source: Ambulatory Visit

## 2014-04-10 ENCOUNTER — Other Ambulatory Visit: Payer: Self-pay | Admitting: *Deleted

## 2014-04-10 DIAGNOSIS — R059 Cough, unspecified: Secondary | ICD-10-CM

## 2014-04-10 DIAGNOSIS — R05 Cough: Secondary | ICD-10-CM

## 2014-04-25 ENCOUNTER — Encounter: Payer: Self-pay | Admitting: Internal Medicine

## 2014-04-25 ENCOUNTER — Ambulatory Visit (INDEPENDENT_AMBULATORY_CARE_PROVIDER_SITE_OTHER): Payer: Medicare Other | Admitting: Internal Medicine

## 2014-04-25 VITALS — BP 138/83 | HR 57 | Temp 98.3°F | Ht 61.0 in | Wt 136.0 lb

## 2014-04-25 DIAGNOSIS — R053 Chronic cough: Secondary | ICD-10-CM

## 2014-04-25 DIAGNOSIS — A310 Pulmonary mycobacterial infection: Secondary | ICD-10-CM

## 2014-04-25 DIAGNOSIS — R05 Cough: Secondary | ICD-10-CM

## 2014-07-13 NOTE — Progress Notes (Signed)
Subjective:    Patient ID: Linda Sanders, female    DOB: 1934-09-09, 79 y.o.   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 She was treated with multidrug therapy for greater than a year. She did have difficulty with tolerance and was taken off of her anti-mycobacterium avium therapy in December of 2011. Has been doing well up until recently with worsening of increased cough, thick white sputum but no purulence, chest congestion, with decreased appetite. she has subjective fevers and worsening fatigue in September. She had chest CT done at [place]. She says that her cough is doing slightly better.  Current Outpatient Prescriptions on File Prior to Visit  Medication Sig Dispense Refill  . Cholecalciferol (VITAMIN D3) 50000 UNITS CAPS Take 1 capsule by mouth once a week.      . gabapentin (NEURONTIN) 400 MG capsule Take 400 mg by mouth 3 (three) times daily.     Marland Kitchen levothyroxine (SYNTHROID, LEVOTHROID) 50 MCG tablet Take 50 mcg by mouth daily.      . Multiple Vitamin (MULTIVITAMIN) capsule Take 1 capsule by mouth daily.      . polyethylene glycol (MIRALAX / GLYCOLAX) packet Take 17 g by mouth daily.      . predniSONE (DELTASONE) 5 MG tablet Take 5 mg by mouth daily.     . traMADol (ULTRAM) 50 MG tablet Take 50 mg by mouth 3 (three) times daily.     Marland Kitchen zolpidem (AMBIEN) 10 MG tablet Take 10 mg by mouth at bedtime as needed.      . benzonatate (TESSALON) 200 MG capsule Take 1 capsule (200 mg total) by mouth 3 (three) times daily as needed for cough. 60 capsule 2   No current facility-administered medications on file prior to visit.   Active Ambulatory Problems    Diagnosis Date Noted  . 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 HYPERTENSION 07/19/2008  . ALLERGIC RHINITIS 07/19/2008  . POLYMYOSITIS 12/26/2008  . SPRAIN&STRAIN OF UNSPECIFIED SITE OF KNEE&LEG 06/14/2009  . ADENOCARCINOMA, BREAST, HX OF 07/19/2008  .  Chest tightness 11/26/2010  . Pulmonary nodules/lesions, multiple 11/26/2010   Resolved Ambulatory Problems    Diagnosis Date Noted  . BACTERIAL PNEUMONIA 07/19/2008  . MASS, LUNG 07/18/2009  . Nonspecific (abnormal) findings on radiological and other examination of body structure 07/19/2008  . ABNORMAL CHEST XRAY 07/19/2008   Past Medical History  Diagnosis Date  . Adenocarcinoma, breast   . Hypertension   . Allergic rhinitis   . Pulmonary nodule       Review of Systems + cough, + doe occasionally, + occ fatigue.    Objective:   Physical Exam BP 138/83 mmHg  Pulse 57  Temp(Src) 98.3 F (36.8 C) (Oral)  Ht 5\' 1"  (1.549 m)  Wt 136 lb (61.689 kg)  BMI 25.71 kg/m2 Physical Exam  Constitutional:  oriented to person, place, and time. appears well-developed and well-nourished. No distress.  HENT:  Mouth/Throat: Oropharynx is clear and moist. No oropharyngeal exudate.  Cardiovascular: Normal rate, regular rhythm and normal heart sounds. Exam reveals no gallop and no friction rub.  No murmur heard.  Pulmonary/Chest: Effort normal and breath sounds normal. No respiratory distress.  has no wheezes.  Abdominal: Soft. Bowel sounds are normal.  exhibits no distension. There is no tenderness.  Lymphadenopathy: no cervical adenopathy.  Neurological: alert and oriented to person, place, and time.  Skin: Skin is warm and dry. No rash noted.  No erythema.       Assessment & Plan:  Hx of pulmonary mac = will contineu to provide supportive care. Ideally, would like to see if can isolate in sputum culture and send for susceptibility testing. Will need to get a copy of chest CT. For now give, tessalon perles for cough. Have her come back in 3 months, sooner if symptoms worsen.  rtc in 3 months

## 2015-02-07 ENCOUNTER — Telehealth: Payer: Self-pay | Admitting: Licensed Clinical Social Worker

## 2015-02-07 NOTE — Telephone Encounter (Signed)
Arbie Cookey from [facility] called to verbalize critical results of Acid Fast Smear is 1+

## 2015-02-08 NOTE — Telephone Encounter (Signed)
They sent them here, it should be in your box.

## 2015-02-08 NOTE — Telephone Encounter (Signed)
Can you have the [place] [facility] send [place] the copy of the results.

## 2015-02-09 ENCOUNTER — Telehealth: Payer: Self-pay | Admitting: *Deleted

## 2015-02-09 NOTE — Telephone Encounter (Signed)
Positive sputum culture previously reported to Dr. Baxter Flattery from [place] Department requesting a PCR order be faxed to [facility] to process the sputum sample.  [facility] lab fax # is [phone].  RN left a voice mail message for Dr. Baxter Flattery to fax order to [facility],

## 2015-02-12 NOTE — Telephone Encounter (Signed)
Health department called to make sure the order for the PCR was sent advised not sure but will send the doctor a message and make sure.

## 2015-02-13 DIAGNOSIS — I10 Essential (primary) hypertension: Secondary | ICD-10-CM

## 2015-02-13 DIAGNOSIS — E785 Hyperlipidemia, unspecified: Secondary | ICD-10-CM

## 2015-02-13 DIAGNOSIS — M47816 Spondylosis without myelopathy or radiculopathy, lumbar region: Secondary | ICD-10-CM

## 2015-02-13 DIAGNOSIS — M797 Fibromyalgia: Secondary | ICD-10-CM

## 2015-02-13 NOTE — Telephone Encounter (Signed)
[place] lab called back for the PCR order, fax [phone]. Informed that Dr. Baxter Flattery would be back in clinic on Thursday 02/15/15 and could it wait until then. She said that would be fine. The order will go directly to the lab, so it just needs to say [facility] on the order. Myrtis Hopping CMA

## 2015-02-14 NOTE — Telephone Encounter (Signed)
Order was sent.this is a patient with pulm MAC disease not thinking it is mTB. Please send me health dept contact info

## 2015-02-14 NOTE — Telephone Encounter (Signed)
[employer]. HD worried that specimen will be thrown out.  Needing MD to fax order ASAP.

## 2015-02-15 NOTE — Telephone Encounter (Signed)
Refaxed and confirmation received

## 2015-02-15 NOTE — Telephone Encounter (Addendum)
Phone number for [facility] - [phone],  Contact person - Hinton Dyer, TB section.

## 2015-02-19 NOTE — Telephone Encounter (Signed)
[facility] nurse called back today and wanted to know if Dr. Baxter Flattery thought this was TB or MAC. Per note below I told the nurse that she considers this to be MAC.

## 2015-03-20 ENCOUNTER — Ambulatory Visit: Payer: Medicare Other | Admitting: Internal Medicine

## 2015-03-26 ENCOUNTER — Ambulatory Visit: Payer: Medicare Other | Admitting: Internal Medicine

## 2015-03-29 ENCOUNTER — Encounter: Payer: Self-pay | Admitting: Infectious Disease

## 2015-03-29 ENCOUNTER — Ambulatory Visit (INDEPENDENT_AMBULATORY_CARE_PROVIDER_SITE_OTHER): Payer: Medicare Other | Admitting: Infectious Disease

## 2015-03-29 VITALS — BP 135/82 | HR 62 | Temp 98.2°F | Wt 142.0 lb

## 2015-03-29 DIAGNOSIS — Z889 Allergy status to unspecified drugs, medicaments and biological substances status: Secondary | ICD-10-CM

## 2015-03-29 DIAGNOSIS — R918 Other nonspecific abnormal finding of lung field: Secondary | ICD-10-CM | POA: Diagnosis not present

## 2015-03-29 DIAGNOSIS — M3320 Polymyositis, organ involvement unspecified: Secondary | ICD-10-CM

## 2015-03-29 DIAGNOSIS — M899 Disorder of bone, unspecified: Secondary | ICD-10-CM | POA: Diagnosis not present

## 2015-03-29 DIAGNOSIS — Z9109 Other allergy status, other than to drugs and biological substances: Secondary | ICD-10-CM

## 2015-03-29 DIAGNOSIS — A310 Pulmonary mycobacterial infection: Secondary | ICD-10-CM | POA: Insufficient documentation

## 2015-03-29 HISTORY — DX: Allergy status to unspecified drugs, medicaments and biological substances: Z88.9

## 2015-03-29 HISTORY — DX: Pulmonary mycobacterial infection: A31.0

## 2015-03-29 HISTORY — DX: Disorder of bone, unspecified: M89.9

## 2015-03-29 MED ORDER — ETHAMBUTOL HCL 400 MG PO TABS: 1200.0000 mg | ORAL_TABLET | Freq: Every day | ORAL | Status: DC

## 2015-03-29 MED ORDER — AZITHROMYCIN 500 MG PO TABS: 500.0000 mg | ORAL_TABLET | Freq: Every day | ORAL | Status: DC

## 2015-03-29 NOTE — Progress Notes (Signed)
Chief complaint :  productive cough malaise subjective fevers and fatigue Subjective:    Patient ID: Linda Sanders, female    DOB: 1935/01/28, 80 y.o.   MRN: [id number]  HPI  80 year old with polymyositis followed by rheumatology who has had several PNA throuhout her life and was dxed with RML syndrome years ago an dhad cx postive for MAC in Dec 09. She was then followed by former partner Dr. Ola Spurr in May 2010. She completed more than a year of therapy it appears. She did have difficulty with tolerance and was taken off of her anti-mycobacterium avium therapy last in December of 2011. I saw her myself in 2012 and then she has done has seen Dr. Baxter Flattery twice in this past year in 2015. Symptoms were not severe enough for anti-mycobacterial therapy to be initiated.  Since having been last seen by Dr. Baxter Flattery the patient had developed worsening daily \\daily  cough with productive sputum, with green flecks. She coughs throughout the day but without much sputum production, occ white sticky material. She has had subjective fevers and with a temperature no higher than 99 degrees. Weight is going up.  She has had a CT scan done at [facility] recently continues show or nodular lung disease. She was able to produce sputum at [facility] that was sent for AFB culture and which has grown Mycobacterium avium species. Sensitivities are shown below in the image and shows susceptibility to macrolides with the clarithromycin MIC of 2.0 likely resistance to rifampin with an MIC of greater than 8 and ethambutol MIC of 8.0 and moxifloxacin MIC of 2.0: Linezolid of 32 MIC of greater than 64 for streptomycin and amikacin susceptibility of 32.       He apparently has had severe rash in the past 2 moxifloxacin and cannot tolerate this. Her allergies to clarithromycin and Augmentin as well as metronidazole are more nausea diarrhea rather genuine allergies.  Past Medical History  Diagnosis Date  .  Adenocarcinoma, breast     Dr. Hinton Rao.  s/p partial mastectomy, xrt, chemo  . Hypertension   . Allergic rhinitis   . Polymyositis     Dr. Justine Null  . Pulmonary nodule   . 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 Appendectomy    . Tubal ligation    . Total abdominal hysterectomy    . Cholecystectomy    . Nasal sinus surgery    . Mastectomy, partial     Family History  Problem Relation Age of Onset  . Allergies Mother   . Allergies Other     children  . Rheum arthritis Mother   . Lymphoma Sister    Social History  Substance Use Topics  . Smoking status: Never Smoker   . Smokeless tobacco: None  . Alcohol Use: No     Current outpatient prescriptions:  .  carvedilol (COREG) 12.5 MG tablet, Take 12.5 mg by mouth 2 (two) times daily., Disp: , Rfl:  .  Cholecalciferol (VITAMIN D3) 50000 UNITS CAPS, Take 1 capsule by mouth once a week.  , Disp: , Rfl:  .  gabapentin (NEURONTIN) 400 MG capsule, Take 400 mg by mouth 3 (three) times daily. , Disp: , Rfl:  .  levothyroxine (SYNTHROID, LEVOTHROID) 50 MCG tablet, Take 50 mcg by mouth daily.  , Disp: , Rfl:  .  Multiple Vitamin (MULTIVITAMIN) capsule, Take 1 capsule by mouth daily.  , Disp: , Rfl:  .  polyethylene glycol (MIRALAX /  GLYCOLAX) packet, Take 17 g by mouth daily.  , Disp: , Rfl:  .  predniSONE (DELTASONE) 5 MG tablet, Take 5 mg by mouth daily. , Disp: , Rfl:  .  traMADol (ULTRAM) 50 MG tablet, Take 50 mg by mouth 3 (three) times daily. , Disp: , Rfl:  .  azithromycin (ZITHROMAX) 500 MG tablet, Take 1 tablet (500 mg total) by mouth daily., Disp: 30 tablet, Rfl: 11 .  benzonatate (TESSALON) 200 MG capsule, Take 1 capsule (200 mg total) by mouth 3 (three) times daily as needed for cough. (Patient not taking: Reported on 03/29/2015), Disp: 60 capsule, Rfl: 2 .  ethambutol (MYAMBUTOL) 400 MG tablet, Take 3 tablets (1,200 mg total) by mouth daily., Disp: 90 tablet, Rfl:  11  Allergies  Allergen Reactions  . Avelox [Moxifloxacin Hcl In Nacl]   . Clarithromycin   . Codeine   . Cymbalta [Duloxetine Hcl]   . Metronidazole And Related   . Moxifloxacin   . Amoxicillin-Pot Clavulanate Nausea And Vomiting  . Clarithromycin Diarrhea     Review of Systems  Constitutional: Positive for fever, chills, activity change, appetite change and fatigue. Negative for diaphoresis and unexpected weight change.  HENT: Negative for congestion, rhinorrhea, sinus pressure, sneezing, sore throat and trouble swallowing.   Eyes: Negative for photophobia and visual disturbance.  Respiratory: Positive for cough and shortness of breath. Negative for chest tightness, wheezing and stridor.   Cardiovascular: Negative for chest pain, palpitations and leg swelling.  Gastrointestinal: Positive for abdominal pain. Negative for nausea, vomiting, diarrhea, constipation, blood in stool, abdominal distention and anal bleeding.  Genitourinary: Negative for dysuria, hematuria, flank pain and difficulty urinating.  Musculoskeletal: Negative for myalgias, back pain, joint swelling, arthralgias and gait problem.  Skin: Negative for color change, pallor, rash and wound.  Neurological: Negative for dizziness, tremors, weakness and light-headedness.  Hematological: Negative for adenopathy. Does not bruise/bleed easily.  Psychiatric/Behavioral: Negative for behavioral problems, confusion, sleep disturbance, dysphoric mood, decreased concentration and agitation.       Objective:   Physical Exam  Constitutional: She is oriented to person, place, and time. She appears well-developed and well-nourished. No distress.  HENT:  Head: Normocephalic and atraumatic.  Mouth/Throat: No oropharyngeal exudate.  Eyes: Conjunctivae and EOM are normal. No scleral icterus.  Neck: Normal range of motion. Neck supple.  Cardiovascular: Normal rate, regular rhythm and normal heart sounds.  Exam reveals no gallop and  no friction rub.   No murmur heard. Pulmonary/Chest: Effort normal. No respiratory distress. She has decreased breath sounds in the right lower field and the left lower field. She has no wheezes.  Abdominal: She exhibits no distension.  Musculoskeletal: She exhibits no edema or tenderness.  Neurological: She is alert and oriented to person, place, and time. She exhibits normal muscle tone. Coordination normal.  Skin: Skin is warm and dry. No rash noted. She is not diaphoretic. No erythema. No pallor.  Psychiatric: She has a normal mood and affect. Her behavior is normal. Judgment and thought content normal.          Assessment & Plan:   Nodular Pulmonary M avium:  This may be very difficult to treat if her susceptibility patterns are true. Forcefully she does not have macrolides resistance we'll start her on azithromycin 500 mg daily along with ethambutol at 15 mg/kg. We can try to adding a third drug such as a fluoroquinolone or potentially try to acquire clofazimine. I also am quite happy to refer her to see Corene Cornea  Lorenda Cahill at [facility] I will see her back in 2 months time.  I  independently reviewed her CT scans done here at [facility] I did not have access to the ones at [place].   Multiple allergies: Viewed in many of these are not actual allergies but intolerance is the moxifloxacin rash sounds a little bit concerning though.  Polymyositis on cord a sterile sheet she seems them to surgery for a coughing.  Skull lesion noted on PET scan in 2014 followed by oncology with her history of breast cancer    We  spent greater than 40 minutes with the patient including greater than 50% of time in face to face counsel of the patient re her nodular lung disease  and in coordination of their care with pharmacy, lab, and reviewing outside records  Alcide Evener, MD

## 2015-04-04 ENCOUNTER — Telehealth: Payer: Self-pay | Admitting: Family Medicine

## 2015-04-04 NOTE — Telephone Encounter (Signed)
Pt called and you did an endoscopy in May of 1996 and continued to see you through 1997.  She is in need of a pulmonary specialist and wanted to know if you recommended anyone. She has gone to the [facility] before but she did not feel welcomed there. Please advise

## 2015-04-04 NOTE — Telephone Encounter (Signed)
Pt informed

## 2015-04-04 NOTE — Telephone Encounter (Signed)
The only other local Pulmonary groups would be [facility] in [place] or [place] affiliated @ [facility]

## 2015-04-05 ENCOUNTER — Telehealth: Payer: Self-pay | Admitting: *Deleted

## 2015-04-05 NOTE — Telephone Encounter (Signed)
Myambutol 400mg  #90  Take three tablets by mouth daily denied by insurance, required prior authorization.  Approved 9/22 - 07/14/15 (unable to get a longer authorization).  RN notified pharmacy, requested they notify [facility] of need for reauthorization at December refill.  Confirmed complete regimen Myambutol 1200mg  daily, azithromycin 500mg  daily. She has already picked up her medications. [employer] prior authorization line: [phone].  Case # [id number]

## 2015-04-25 DIAGNOSIS — E039 Hypothyroidism, unspecified: Secondary | ICD-10-CM

## 2015-04-25 DIAGNOSIS — E785 Hyperlipidemia, unspecified: Secondary | ICD-10-CM

## 2015-04-25 DIAGNOSIS — Z79899 Other long term (current) drug therapy: Secondary | ICD-10-CM

## 2015-04-25 DIAGNOSIS — Z1239 Encounter for other screening for malignant neoplasm of breast: Secondary | ICD-10-CM

## 2015-04-30 ENCOUNTER — Telehealth: Payer: Self-pay | Admitting: *Deleted

## 2015-04-30 NOTE — Telephone Encounter (Signed)
Patient is refilling her medications. Per patient, her insurance will allow generic myambutol without prior authorization.  OK for generic substitution? Otherwise, myambutol is only approved through 12/31. Landis Gandy, RN

## 2015-04-30 NOTE — Telephone Encounter (Signed)
Certainly thanks Sharyn Lull!

## 2015-05-01 NOTE — Telephone Encounter (Signed)
I let her pharmacy know. Thanks!

## 2015-05-01 NOTE — Telephone Encounter (Signed)
Perfect

## 2015-06-11 ENCOUNTER — Ambulatory Visit (INDEPENDENT_AMBULATORY_CARE_PROVIDER_SITE_OTHER): Payer: Medicare Other | Admitting: Infectious Disease

## 2015-06-11 ENCOUNTER — Encounter: Payer: Self-pay | Admitting: Infectious Disease

## 2015-06-11 VITALS — Wt 140.0 lb

## 2015-06-11 DIAGNOSIS — M3320 Polymyositis, organ involvement unspecified: Secondary | ICD-10-CM

## 2015-06-11 DIAGNOSIS — Z9109 Other allergy status, other than to drugs and biological substances: Secondary | ICD-10-CM

## 2015-06-11 DIAGNOSIS — R110 Nausea: Secondary | ICD-10-CM

## 2015-06-11 DIAGNOSIS — Z889 Allergy status to unspecified drugs, medicaments and biological substances status: Secondary | ICD-10-CM

## 2015-06-11 DIAGNOSIS — H938X9 Other specified disorders of ear, unspecified ear: Secondary | ICD-10-CM | POA: Insufficient documentation

## 2015-06-11 DIAGNOSIS — R197 Diarrhea, unspecified: Secondary | ICD-10-CM

## 2015-06-11 DIAGNOSIS — H938X3 Other specified disorders of ear, bilateral: Secondary | ICD-10-CM | POA: Diagnosis not present

## 2015-06-11 DIAGNOSIS — A310 Pulmonary mycobacterial infection: Secondary | ICD-10-CM

## 2015-06-11 DIAGNOSIS — H538 Other visual disturbances: Secondary | ICD-10-CM

## 2015-06-11 HISTORY — DX: Nausea: R11.0

## 2015-06-11 HISTORY — DX: Diarrhea, unspecified: R19.7

## 2015-06-11 HISTORY — DX: Other visual disturbances: H53.8

## 2015-06-11 HISTORY — DX: Other specified disorders of ear, unspecified ear: H93.8X9

## 2015-06-11 MED ORDER — CARBAMIDE PEROXIDE 6.5 % OT SOLN: 5.0000 [drp] | Freq: Two times a day (BID) | OTIC | Status: DC

## 2015-06-11 NOTE — Progress Notes (Signed)
Chief complaint : diarrhea, nausea, ear fullness, blurry vision, malaise, body aches, most of which are better after stopping her abx Subjective:    Patient ID: Linda Sanders, female    DOB: 07/14/1935, 80 y.o.   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 She was then followed by former partner Dr. Ola Spurr in May 2010. She completed more than a year of therapy it appears. She did have difficulty with tolerance and was taken off of her anti-mycobacterium avium therapy last in December of 2011. I saw her myself in 2012 and then she has done has seen Dr. Baxter Flattery twice in this past year in 2015. Symptoms were not severe enough for anti-mycobacterial therapy to be initiated.  Since having been last seen by Dr. Baxter Flattery the patient had developed worsening daily \\daily  cough with productive sputum, with green flecks. She coughs throughout the day but without much sputum production, occ white sticky material. She has had subjective fevers and with a temperature no higher than 99 degrees. Weight is going up.  She has had a CT scan done at [facility] that continued to show or nodular lung disease. She was able to produce sputum at [facility] that was sent for AFB culture and which has grown Mycobacterium avium species. Sensitivities are shown below in the image and shows susceptibility to macrolides with the clarithromycin MIC of 2.0 likely resistance to rifampin with an MIC of greater than 8 and ethambutol MIC of 8.0 and moxifloxacin MIC of 2.0: Linezolid of 32 MIC of greater than 64 for streptomycin and amikacin susceptibility of 32.       He apparently has had severe rash in the past 2 moxifloxacin and cannot tolerate this. Her allergies to clarithromycin and Augmentin as well as metronidazole are more nausea diarrhea rather  genuine allergies.  When I last saw her we opted to restart her on azithromycin 500mg  and ETH at 15mg /kg daily but since then she has had worsening nausea, diarrhea, ear fullness, blurry vision, myalgias, malaise and has stopped the antibiotics about a week ago with improvement in many of her symptoms except her ear fullness and her vision has not returned to normal.  Past Medical History  Diagnosis Date  . 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 Hinton Rao.  s/p partial mastectomy, xrt, chemo  . Hypertension   . Allergic rhinitis   . Polymyositis ([place])     Dr. Justine Null  . Pulmonary nodule   . 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 Skull lesion 03/29/2015  . Multiple allergies 03/29/2015   Past Surgical History  Procedure Laterality Date  . Appendectomy    . Tubal ligation    . Total abdominal hysterectomy    . Cholecystectomy    . Nasal sinus surgery    . Mastectomy, partial     Family History  Problem Relation Age of Onset  . Allergies Mother   . Allergies Other     children  . Rheum arthritis Mother   . Lymphoma Sister    Social History  Substance Use Topics  . Smoking status: Never Smoker   . Smokeless tobacco: None  . Alcohol Use: No     Current outpatient prescriptions:  .  azithromycin (ZITHROMAX) 500 MG tablet, Take 1 tablet (500 mg total) by mouth daily., Disp: 30 tablet,  Rfl: 11 .  benzonatate (TESSALON) 200 MG capsule, Take 1 capsule (200 mg total) by mouth 3 (three) times daily as needed for cough. (Patient not taking: Reported on 03/29/2015), Disp: 60 capsule, Rfl: 2 .  carvedilol (COREG) 12.5 MG tablet, Take 12.5 mg by mouth 2 (two) times daily., Disp: , Rfl:  .  Cholecalciferol (VITAMIN D3) 50000 UNITS CAPS, Take 1 capsule by mouth once a week.  , Disp: , Rfl:  .  ethambutol (MYAMBUTOL) 400 MG tablet, Take 3 tablets (1,200 mg total) by mouth daily., Disp: 90 tablet, Rfl: 11 .  gabapentin (NEURONTIN) 400 MG capsule, Take 400 mg by mouth 3 (three) times daily.  , Disp: , Rfl:  .  levothyroxine (SYNTHROID, LEVOTHROID) 50 MCG tablet, Take 50 mcg by mouth daily.  , Disp: , Rfl:  .  Multiple Vitamin (MULTIVITAMIN) capsule, Take 1 capsule by mouth daily.  , Disp: , Rfl:  .  polyethylene glycol (MIRALAX / GLYCOLAX) packet, Take 17 g by mouth daily.  , Disp: , Rfl:  .  predniSONE (DELTASONE) 5 MG tablet, Take 5 mg by mouth daily. , Disp: , Rfl:  .  traMADol (ULTRAM) 50 MG tablet, Take 50 mg by mouth 3 (three) times daily. , Disp: , Rfl:   Allergies  Allergen Reactions  . Avelox [Moxifloxacin Hcl In Nacl]   . Clarithromycin   . Codeine   . Cymbalta [Duloxetine Hcl]   . Metronidazole And Related   . Moxifloxacin   . Amoxicillin-Pot Clavulanate Nausea And Vomiting  . Clarithromycin Diarrhea     Review of Systems  Constitutional: Positive for appetite change and fatigue. Negative for fever, chills, diaphoresis, activity change and unexpected weight change.  HENT: Positive for congestion, ear pain and sinus pressure. Negative for rhinorrhea, sneezing, sore throat and trouble swallowing.   Eyes: Positive for visual disturbance. Negative for photophobia.  Respiratory: Positive for cough. Negative for chest tightness, shortness of breath, wheezing and stridor.   Cardiovascular: Negative for chest pain, palpitations and leg swelling.  Gastrointestinal: Positive for nausea, abdominal pain and diarrhea. Negative for vomiting, constipation, blood in stool, abdominal distention and anal bleeding.  Genitourinary: Negative for dysuria, hematuria, flank pain and difficulty urinating.  Musculoskeletal: Positive for myalgias. Negative for back pain, joint swelling, arthralgias and gait problem.  Skin: Negative for color change, pallor, rash and wound.  Neurological: Negative for dizziness, tremors, weakness and light-headedness.  Hematological: Negative for adenopathy. Does not bruise/bleed easily.  Psychiatric/Behavioral: Negative for behavioral problems,  confusion, sleep disturbance, dysphoric mood, decreased concentration and agitation.       Objective:   Physical Exam  Constitutional: She is oriented to person, place, and time. She appears well-developed and well-nourished. No distress.  HENT:  Head: Normocephalic and atraumatic.  Mouth/Throat: No oropharyngeal exudate.  Both ears with copious wax buildup  Eyes: Conjunctivae and EOM are normal. No scleral icterus.  Neck: Normal range of motion. Neck supple.  Cardiovascular: Normal rate, regular rhythm and normal heart sounds.  Exam reveals no gallop and no friction rub.   No murmur heard. Pulmonary/Chest: Effort normal and breath sounds normal. No respiratory distress. She has no wheezes. She has no rales.  Abdominal: Soft. She exhibits no distension.  Musculoskeletal: She exhibits no edema or tenderness.  Neurological: She is alert and oriented to person, place, and time. She exhibits normal muscle tone. Coordination normal.  Skin: Skin is warm and dry. No rash noted. She is not diaphoretic. No erythema. No pallor.  Psychiatric: Her  behavior is normal. Judgment and thought content normal. She exhibits a depressed mood.          Assessment & Plan:   Ear fullness: due to wax build up. My RN performed saline irrigation and pt to try otc oils, peroxide  Blurry vision: could be reversible SE from [facility] and she could do ok on rechallenge at lower dose  Nausea, diarrhea, malaise, mylagias: she clearly is NOT tolerating her M avium meds. I would consider rechallgenging her in the future with lower dose of azithromycin ETH but for now will leave her off meds   Blurry vision: needs to se ophthalmology if this is not resolving quickly. To stay off her M avium drugs for now  Nodular Pulmonary M avium:  See prior discussion. Will stop rx for now as toxicity and SE of therapy are worsening her quality of life. Will re-evaluate in 6 months to see if risk/benefit has changed if cough worse,  weight loss setting in etc   Multiple allergies: Viewed in many of these are not actual allergies but intolerance is the moxifloxacin rash sounds a little bit concerning though.  Polymyositis hard to know when this might be flaring rather than SE from her abx  Skull lesion noted on PET scan in 2014 followed by oncology with her history of breast cancer    We  spent greater than 40 minutes with the patient including greater than 50% of time in face to face counsel of the patient re her nodular lung disease, ear fullness, blurry vision nausea, diarrhea, malaise  and in coordination of their care  Alcide Evener, MD

## 2015-10-13 HISTORY — PX: POSTERIOR LUMBAR FUSION: SHX6036

## 2015-10-18 ENCOUNTER — Telehealth: Payer: Self-pay | Admitting: *Deleted

## 2015-10-18 NOTE — Telephone Encounter (Signed)
I cannot "clear her for surgery" I do NOT think her M avium poses and risk to her spine as far as risk for infection. The  More important medical risks for surgery can be estimated by her PCP

## 2015-10-18 NOTE — Telephone Encounter (Signed)
The patient is to have surgery on Monday 10/22/15 and the Spine and Scoliosis office called to get a letter of medical clearance for the patient. Advised she had not been seen since 05/2015 and that the doctor is out of the office until 10/29/15. She asked if I could send him a message and see if he will give clearance or if someone else in the office will do it. Advised her her doctor is [facility] and if anyone else would do it she would have to be seen and possibly have labs as they are not familiar with the case. Advised her I will send [facility] a message to see if he can do it and will call her back once he responds.

## 2015-10-19 ENCOUNTER — Encounter: Payer: Self-pay | Admitting: *Deleted

## 2015-10-19 NOTE — Telephone Encounter (Signed)
Called Linda Sanders at the spine center and let her know that Linda Sanders is willing to say M Avium does not pose a risk to surgery but not "clear for surgery". She did not say she wanted the letter but questioned why he would not want to say "cleared for surgery" advised not sure but reiterated will say mycobacterium avium does not pose a risk to surgery.

## 2015-11-01 DIAGNOSIS — E785 Hyperlipidemia, unspecified: Secondary | ICD-10-CM

## 2015-11-01 DIAGNOSIS — E039 Hypothyroidism, unspecified: Secondary | ICD-10-CM

## 2015-11-01 DIAGNOSIS — Z79899 Other long term (current) drug therapy: Secondary | ICD-10-CM

## 2015-12-03 ENCOUNTER — Encounter: Payer: Self-pay | Admitting: Internal Medicine

## 2015-12-06 ENCOUNTER — Ambulatory Visit: Payer: Medicare Other | Admitting: Infectious Disease

## 2015-12-20 DIAGNOSIS — Z1589 Genetic susceptibility to other disease: Secondary | ICD-10-CM

## 2015-12-20 DIAGNOSIS — C50919 Malignant neoplasm of unspecified site of unspecified female breast: Secondary | ICD-10-CM | POA: Diagnosis not present

## 2015-12-20 DIAGNOSIS — R918 Other nonspecific abnormal finding of lung field: Secondary | ICD-10-CM | POA: Diagnosis not present

## 2015-12-20 DIAGNOSIS — K869 Disease of pancreas, unspecified: Secondary | ICD-10-CM | POA: Diagnosis not present

## 2015-12-20 DIAGNOSIS — N289 Disorder of kidney and ureter, unspecified: Secondary | ICD-10-CM | POA: Diagnosis not present

## 2015-12-20 DIAGNOSIS — M8580 Other specified disorders of bone density and structure, unspecified site: Secondary | ICD-10-CM

## 2015-12-28 ENCOUNTER — Encounter (HOSPITAL_COMMUNITY): Payer: Self-pay | Admitting: Internal Medicine

## 2015-12-28 ENCOUNTER — Inpatient Hospital Stay (HOSPITAL_COMMUNITY)
Admission: AD | Admit: 2015-12-28 | Discharge: 2015-12-30 | DRG: 194 | Disposition: A | Discharge status: Home / Self Care | Payer: Medicare Other | Source: Other Acute Inpatient Hospital | Attending: Internal Medicine | Admitting: Internal Medicine

## 2015-12-28 DIAGNOSIS — I10 Essential (primary) hypertension: Secondary | ICD-10-CM | POA: Diagnosis present

## 2015-12-28 DIAGNOSIS — K219 Gastro-esophageal reflux disease without esophagitis: Secondary | ICD-10-CM | POA: Diagnosis present

## 2015-12-28 DIAGNOSIS — R05 Cough: Secondary | ICD-10-CM | POA: Diagnosis present

## 2015-12-28 DIAGNOSIS — M3320 Polymyositis, organ involvement unspecified: Secondary | ICD-10-CM | POA: Diagnosis present

## 2015-12-28 DIAGNOSIS — E860 Dehydration: Secondary | ICD-10-CM | POA: Diagnosis present

## 2015-12-28 DIAGNOSIS — E039 Hypothyroidism, unspecified: Secondary | ICD-10-CM | POA: Diagnosis present

## 2015-12-28 DIAGNOSIS — Z7951 Long term (current) use of inhaled steroids: Secondary | ICD-10-CM

## 2015-12-28 DIAGNOSIS — A310 Pulmonary mycobacterial infection: Secondary | ICD-10-CM | POA: Diagnosis present

## 2015-12-28 DIAGNOSIS — Z1639 Resistance to other specified antimicrobial drug: Secondary | ICD-10-CM | POA: Diagnosis present

## 2015-12-28 DIAGNOSIS — M797 Fibromyalgia: Secondary | ICD-10-CM | POA: Diagnosis not present

## 2015-12-28 DIAGNOSIS — E871 Hypo-osmolality and hyponatremia: Secondary | ICD-10-CM | POA: Diagnosis present

## 2015-12-28 DIAGNOSIS — Z853 Personal history of malignant neoplasm of breast: Secondary | ICD-10-CM | POA: Diagnosis not present

## 2015-12-28 DIAGNOSIS — J069 Acute upper respiratory infection, unspecified: Secondary | ICD-10-CM | POA: Diagnosis not present

## 2015-12-28 DIAGNOSIS — Z9221 Personal history of antineoplastic chemotherapy: Secondary | ICD-10-CM

## 2015-12-28 DIAGNOSIS — J189 Pneumonia, unspecified organism: Principal | ICD-10-CM | POA: Diagnosis present

## 2015-12-28 DIAGNOSIS — Z881 Allergy status to other antibiotic agents status: Secondary | ICD-10-CM | POA: Diagnosis not present

## 2015-12-28 HISTORY — DX: Unspecified osteoarthritis, unspecified site: M19.90

## 2015-12-28 HISTORY — DX: Unspecified chronic bronchitis: J42

## 2015-12-28 HISTORY — DX: Gastro-esophageal reflux disease without esophagitis: K21.9

## 2015-12-28 HISTORY — DX: Pneumonia, unspecified organism: J18.9

## 2015-12-28 HISTORY — DX: Hypothyroidism, unspecified: E03.9

## 2015-12-28 LAB — CBC WITH DIFFERENTIAL/PLATELET
Basophils Absolute: 0 10*3/uL (ref 0.0–0.1)
Basophils Relative: 0 %
Eosinophils Absolute: 0.3 10*3/uL (ref 0.0–0.7)
Eosinophils Relative: 3 %
HCT: 35.2 % — ABNORMAL LOW (ref 36.0–46.0)
Hemoglobin: 11.3 g/dL — ABNORMAL LOW (ref 12.0–15.0)
Lymphocytes Relative: 15 %
Lymphs Abs: 1.7 10*3/uL (ref 0.7–4.0)
MCH: 32 pg (ref 26.0–34.0)
MCHC: 32.1 g/dL (ref 30.0–36.0)
MCV: 99.7 fL (ref 78.0–100.0)
Monocytes Absolute: 1.6 10*3/uL — ABNORMAL HIGH (ref 0.1–1.0)
Monocytes Relative: 14 %
Neutro Abs: 8.1 10*3/uL — ABNORMAL HIGH (ref 1.7–7.7)
Neutrophils Relative %: 68 %
Platelets: 289 10*3/uL (ref 150–400)
RBC: 3.53 MIL/uL — ABNORMAL LOW (ref 3.87–5.11)
RDW: 13.5 % (ref 11.5–15.5)
WBC: 11.8 10*3/uL — ABNORMAL HIGH (ref 4.0–10.5)

## 2015-12-28 LAB — COMPREHENSIVE METABOLIC PANEL
ALT: 25 U/L (ref 14–54)
AST: 24 U/L (ref 15–41)
Albumin: 2.9 g/dL — ABNORMAL LOW (ref 3.5–5.0)
Alkaline Phosphatase: 70 U/L (ref 38–126)
Anion gap: 9 (ref 5–15)
BUN: 9 mg/dL (ref 6–20)
CO2: 22 mmol/L (ref 22–32)
Calcium: 9.2 mg/dL (ref 8.9–10.3)
Chloride: 98 mmol/L — ABNORMAL LOW (ref 101–111)
Creatinine, Ser: 0.99 mg/dL (ref 0.44–1.00)
GFR calc Af Amer: >60 mL/min (ref >60)
GFR calc non Af Amer: 53 mL/min — ABNORMAL LOW (ref >60)
Glucose, Bld: 82 mg/dL (ref 65–99)
Potassium: 4.2 mmol/L (ref 3.5–5.1)
Sodium: 129 mmol/L — ABNORMAL LOW (ref 135–145)
Total Bilirubin: 0.5 mg/dL (ref 0.3–1.2)
Total Protein: 6.5 g/dL (ref 6.5–8.1)

## 2015-12-28 MED ORDER — DEXTROSE 5 % IV SOLN
1.0000 g | INTRAVENOUS | Status: DC
  Administered 2015-12-29: 1 g via INTRAVENOUS
  Filled 2015-12-28: qty 10

## 2015-12-28 MED ORDER — ALBUTEROL SULFATE (2.5 MG/3ML) 0.083% IN NEBU: 2.5000 mg | INHALATION_SOLUTION | RESPIRATORY_TRACT | Status: DC | PRN

## 2015-12-28 MED ORDER — GUAIFENESIN ER 600 MG PO TB12
600.0000 mg | ORAL_TABLET | Freq: Two times a day (BID) | ORAL | Status: DC
  Administered 2015-12-28 – 2015-12-30 (×4): 600 mg via ORAL
  Filled 2015-12-28 (×4): qty 1

## 2015-12-28 MED ORDER — OXYCODONE-ACETAMINOPHEN 5-325 MG PO TABS
1.0000 | ORAL_TABLET | ORAL | Status: DC | PRN
  Administered 2015-12-28 – 2015-12-30 (×4): 2 via ORAL
  Filled 2015-12-28 (×4): qty 2

## 2015-12-28 MED ORDER — DEXTROSE 5 % IV SOLN
500.0000 mg | INTRAVENOUS | Status: DC
  Administered 2015-12-29: 500 mg via INTRAVENOUS
  Filled 2015-12-28: qty 500

## 2015-12-28 MED ORDER — GABAPENTIN 400 MG PO CAPS
400.0000 mg | ORAL_CAPSULE | Freq: Three times a day (TID) | ORAL | Status: DC
  Administered 2015-12-28 – 2015-12-30 (×6): 400 mg via ORAL
  Filled 2015-12-28 (×6): qty 1

## 2015-12-28 MED ORDER — ENOXAPARIN SODIUM 40 MG/0.4ML ~~LOC~~ SOLN
40.0000 mg | SUBCUTANEOUS | Status: DC
  Administered 2015-12-29 – 2015-12-30 (×2): 40 mg via SUBCUTANEOUS
  Filled 2015-12-28 (×2): qty 0.4

## 2015-12-28 MED ORDER — SODIUM CHLORIDE 0.9 % IV SOLN
INTRAVENOUS | Status: AC
  Administered 2015-12-29: 02:00:00 via INTRAVENOUS

## 2015-12-28 MED ORDER — METHOCARBAMOL 500 MG PO TABS
500.0000 mg | ORAL_TABLET | Freq: Four times a day (QID) | ORAL | Status: DC | PRN
Start: 2015-12-28 — End: 2015-12-30
  Administered 2015-12-28: 500 mg via ORAL
  Filled 2015-12-28: qty 1

## 2015-12-28 MED ORDER — LEVOTHYROXINE SODIUM 50 MCG PO TABS
50.0000 ug | ORAL_TABLET | Freq: Every day | ORAL | Status: DC
  Administered 2015-12-29: 50 ug via ORAL
  Filled 2015-12-28: qty 1

## 2015-12-28 MED ORDER — PREDNISONE 5 MG PO TABS
5.0000 mg | ORAL_TABLET | Freq: Every day | ORAL | Status: DC
  Administered 2015-12-29 – 2015-12-30 (×2): 5 mg via ORAL
  Filled 2015-12-28 (×2): qty 1

## 2015-12-28 MED ORDER — TRAMADOL HCL 50 MG PO TABS
50.0000 mg | ORAL_TABLET | Freq: Three times a day (TID) | ORAL | Status: DC
  Administered 2015-12-28 – 2015-12-30 (×6): 50 mg via ORAL
  Filled 2015-12-28 (×6): qty 1

## 2015-12-28 MED ORDER — CARVEDILOL 12.5 MG PO TABS
12.5000 mg | ORAL_TABLET | Freq: Two times a day (BID) | ORAL | Status: DC
  Administered 2015-12-29 – 2015-12-30 (×4): 12.5 mg via ORAL
  Filled 2015-12-28 (×4): qty 1

## 2015-12-28 NOTE — H&P (Addendum)
Linda Sanders [id number] DOB: 12-Apr-1935 DOA: 12/28/2015     PCP: Teressa Lower, MD   Outpatient Specialists: ID Tommy Medal   Patient coming from:   home Lives alone,        Chief Complaint: cough  HPI: Linda Sanders is a 81 y.o. female with medical history significant of MAC chronic lung infection, breast cancer, polymyositis followed by rheumatology    Presented with worsening cough to [facility]. She was diagnosed with upper respiratory infection was treated with Bactrim but developed nausea and today started to have vomiting. He have had very poor by mouth intake lately started to feel lightheaded every time she tries to sit up She reports fevers up to 101. She had some sputum production green sputum. No sick contacts. 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 Lucianne Lei dam that recommended transfer to [facility] for further treatment   Regarding pertinent Chronic problems: Patient has long-standing pulmonary infection of MAC since December 2009 followed by multiple ID physicians Amber she was seen in our office due to continuous coughing and low-grade fevers. Per ID note She has had a CT scan done at [facility] that continued to show or nodular lung disease. She was able to produce sputum at [facility] that was sent for AFB culture and which has grown Mycobacterium avium species. Sensitivities are  susceptibility to macrolides with the clarithromycin MIC of  2.0 likely resistance to rifampin with an MIC of greater than 8 and ethambutol MIC of 8.0 and moxifloxacin MIC of 2.0: Linezolid of 32 MIC of greater than 64 for streptomycin and amikacin susceptibility of 32. She has had severe rash in the past 2 moxifloxacin and cannot tolerate this. Her allergies to clarithromycin and Augmentin as well as metronidazole are more nausea diarrhea rather genuine allergies.  He initially  restarted her on azithromycin 500mg  and ETH at 15mg /kg daily but since then she has had worsening nausea, diarrhea, ear fullness, blurry vision, myalgias, malaise and has stopped the antibiotics  with improvement in many of her symptoms except her ear fullness and her vision has not returned to normal.     Patient have had spinal surgery 8 weeks ago   Hospitalist was called for admission for MAC  Review of Systems:    Pertinent positives include: Fevers, chills, fatigue, shortness of breath at rest.  dyspnea on exertion, productive cough, excess mucus,   Constitutional:  No weight loss, night sweats, weight loss  HEENT:  No headaches, Difficulty swallowing,Tooth/dental problems,Sore throat,  No sneezing, itching, ear ache, nasal congestion, post nasal drip,  Cardio-vascular:  No chest pain, Orthopnea, PND, anasarca, dizziness, palpitations.no Bilateral lower extremity swelling  GI:  No heartburn, indigestion, abdominal pain, nausea, vomiting, diarrhea, change in bowel habits, loss of appetite, melena, blood in stool, hematemesis Resp:  No coughing up of blood.No change in color of mucus.No wheezing. Skin:  no rash or lesions. No jaundice GU:  no dysuria, change in color of urine, no urgency or frequency. No straining  to urinate.  No flank pain.  Musculoskeletal:  No joint pain or no joint swelling. No decreased range of motion. No back pain.  Psych:  No change in mood or affect. No depression or anxiety. No memory loss.  Neuro: no localizing neurological complaints,  no tingling, no weakness, no double vision, no gait abnormality, no slurred speech, no confusion  As per HPI otherwise 10 point review of systems negative.   Past Medical History: Past Medical History  Diagnosis Date  . 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 Hinton Rao.  s/p partial mastectomy, xrt, chemo  . Hypertension   . Allergic rhinitis   . Polymyositis ([place])     Dr. Justine Null  . Pulmonary nodule   . 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 Skull lesion 03/29/2015  . Multiple allergies 03/29/2015  . Ear fullness 06/11/2015  . Blurry vision 06/11/2015  . Nausea without vomiting 06/11/2015  . Diarrhea 06/11/2015   Past Surgical History  Procedure Laterality Date  . Appendectomy    . Tubal ligation    . Total abdominal hysterectomy    . Cholecystectomy    . Nasal sinus surgery    . Mastectomy, partial       Social History:  Ambulatory independently    reports that she has never smoked. She does not have any smokeless tobacco history on file. She reports that she does not drink alcohol or use illicit drugs.  Allergies:   Allergies  Allergen Reactions  . Avelox [Moxifloxacin Hcl In Nacl]   . Clarithromycin   . Codeine   . Cymbalta [Duloxetine Hcl]   . Metronidazole And Related   . Moxifloxacin   . Amoxicillin-Pot Clavulanate Nausea And Vomiting  . Clarithromycin Diarrhea     Family History:  Family History  Problem Relation Age of Onset  . Allergies Mother   . Allergies Other     children  . Rheum arthritis Mother   . Lymphoma Sister     Medications: Prior to Admission medications   Medication Sig Start Date End Date Taking? Authorizing Provider  acetaminophen (TYLENOL) 325 MG tablet Take 650 mg by mouth as needed.    Historical Provider, MD  benzonatate (TESSALON) 200 MG capsule Take 1 capsule (200 mg total) by mouth 3 (three) times daily as needed for cough. Patient not taking: Reported on 03/29/2015 03/28/14   Carlyle Basques, MD  carbamide peroxide  [facility]) 6.5 % otic solution Place 5 drops into both ears 2 (two) times daily. 06/11/15   Truman Hayward, MD  carvedilol (COREG) 12.5 MG tablet Take 12.5 mg by mouth 2 (two) times daily. 04/12/14   Historical Provider, MD  Cholecalciferol (VITAMIN D3) 50000 UNITS CAPS Take 1 capsule by mouth once a week.      Historical Provider, MD  famotidine-calcium carbonate-magnesium hydroxide (PEPCID COMPLETE) 10-800-165 MG chewable tablet Chew 1 tablet by mouth daily as needed.    Historical Provider, MD  gabapentin (NEURONTIN) 400 MG capsule Take 400 mg by mouth 3 (three) times daily.  08/19/10   Historical Provider, MD  levothyroxine (SYNTHROID, LEVOTHROID) 50 MCG tablet Take 50 mcg by mouth daily.      Historical Provider, MD  Multiple Vitamin (MULTIVITAMIN) capsule Take 1 capsule by mouth daily.      Historical Provider, MD  predniSONE (DELTASONE) 5 MG tablet Take 5 mg by mouth daily.     Historical Provider, MD  traMADol (ULTRAM) 50 MG tablet Take 50 mg by mouth 3 (three) times  daily.  08/19/10   Historical Provider, MD    Physical Exam: No data found.   1. General:  in No Acute distress 2. Psychological: Alert and   Oriented 3. Head/ENT:    Dry Mucous Membranes                          Head Non traumatic, neck supple                          Normal   Dentition 4. SKIN:   decreased Skin turgor,  Skin clean Dry and intact no rash 5. Heart: Regular rate and rhythm no Murmur, Rub or gallop 6. Lungs:  , no wheezes some crackles   7. Abdomen: Soft, non-tender, Non distended 8. Lower extremities: no clubbing, cyanosis, or edema 9. Neurologically Grossly intact, moving all 4 extremities equally 10. MSK: Normal range of motion   body mass index is unknown because there is no weight on file.  Labs on Admission:   Labs on Admission: I have personally reviewed following labs and imaging studies  CBC: No results for input(s): WBC, NEUTROABS, HGB, HCT, MCV, PLT in the last 168 hours. Basic  Metabolic Panel: No results for input(s): NA, K, CL, CO2, GLUCOSE, BUN, CREATININE, CALCIUM, MG, PHOS in the last 168 hours. GFR: CrCl cannot be calculated (Unknown ideal weight.). Liver Function Tests: No results for input(s): AST, ALT, ALKPHOS, BILITOT, PROT, ALBUMIN in the last 168 hours. No results for input(s): LIPASE, AMYLASE in the last 168 hours. No results for input(s): AMMONIA in the last 168 hours. Coagulation Profile: No results for input(s): INR, PROTIME in the last 168 hours. Cardiac Enzymes: No results for input(s): CKTOTAL, CKMB, CKMBINDEX, TROPONINI in the last 168 hours. BNP (last 3 results) No results for input(s): PROBNP in the last 8760 hours. HbA1C: No results for input(s): HGBA1C in the last 72 hours. CBG: No results for input(s): GLUCAP in the last 168 hours. Lipid Profile: No results for input(s): CHOL, HDL, LDLCALC, TRIG, CHOLHDL, LDLDIRECT in the last 72 hours. Thyroid Function Tests: No results for input(s): TSH, T4TOTAL, FREET4, T3FREE, THYROIDAB in the last 72 hours. Anemia Panel: No results for input(s): VITAMINB12, FOLATE, FERRITIN, TIBC, IRON, RETICCTPCT in the last 72 hours. Urine analysis: No results found for: COLORURINE, APPEARANCEUR, LABSPEC, PHURINE, GLUCOSEU, HGBUR, BILIRUBINUR, KETONESUR, PROTEINUR, UROBILINOGEN, NITRITE, LEUKOCYTESUR Sepsis Labs: @LABRCNTIP (procalcitonin:4,lacticidven:4) )No results found for this or any previous visit (from the past 240 hour(s)).      UA ordered  No results found for: HGBA1C  CrCl cannot be calculated (Unknown ideal weight.).  BNP (last 3 results) No results for input(s): PROBNP in the last 8760 hours.   ECG REPORT  Independently reviewed Rate:87  Rhythm: Normal sinus rhythm ST&T Change: No acute ischemic changes  QTC 430  There were no vitals filed for this visit.   Cultures: No results found for: [place], [place], [place], REPTSTATUS   Radiological Exams on Admission: No results  found.  Chart has been reviewed    Assessment/Plan 81 y.o. female with medical history significant of MAC chronic lung infection, breast cancer, polymyositis  here with total bili worsening of her MAC chronic lung infection versus community-acquired pneumonia associated dehydration and hyponatremia  Present on Admission:    atypical pneumonia  - given high-grade fever and leukocytosis cannot rule out complication of community-acquired pneumonia will cover for tonight with Rocephin and Zithromax patient does have intolerance which  is GI upset but hopefully will tolerate IV  . Polymyositis ([name]) - on lung term steroids. And tinea for tonight to avoid adrenal crisis would benefit from slow weaning if possible given  history of polymyositis and fibromyalgia  . 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 Will order AFB As per [facility] records CT scan already ordered in [place] will hold off on repeat. It did show worsening evidence of MAC.  We'll defer to infectious disease with choice of antibiotics  . Dehydration administer IV fluids  . Hyponatremia -- likely secondary to dehydration, will give IVF, check Urine Na, Cr, Osmolarity. Monitor Na levels to avoid over aggressive correction. Check TSH. Stop offending medications. If no improvement with IVF will initiate further work up for SIADH if appropriate.  . Essential hypertension  - stable continue home medications  History of breast cancer  - remote Other plan as per orders.  DVT prophylaxis:  SCD     Code Status:  FULL CODE  as per patient    Family Communication:   Family   at  Bedside  plan of care was discussed with   Daughter,  Laurell Roof [id number]  Disposition Plan:   To home once workup is complete and patient is stable   Consults called: ID  Admission status: inpatient       Level of care   medical floor     [place] 12/28/2015,  10:57 PM    [facility]  Pager [phone]   after 2 AM please page floor coverage PA If 7AM-7PM, please contact the day team taking care of the patient  [email or web address]  Password [email or web address]

## 2015-12-29 ENCOUNTER — Inpatient Hospital Stay (HOSPITAL_COMMUNITY): Payer: Medicare Other

## 2015-12-29 DIAGNOSIS — E860 Dehydration: Secondary | ICD-10-CM

## 2015-12-29 DIAGNOSIS — J069 Acute upper respiratory infection, unspecified: Secondary | ICD-10-CM

## 2015-12-29 DIAGNOSIS — Z881 Allergy status to other antibiotic agents status: Secondary | ICD-10-CM

## 2015-12-29 DIAGNOSIS — A310 Pulmonary mycobacterial infection: Secondary | ICD-10-CM

## 2015-12-29 DIAGNOSIS — I10 Essential (primary) hypertension: Secondary | ICD-10-CM

## 2015-12-29 DIAGNOSIS — J189 Pneumonia, unspecified organism: Principal | ICD-10-CM

## 2015-12-29 DIAGNOSIS — E871 Hypo-osmolality and hyponatremia: Secondary | ICD-10-CM

## 2015-12-29 LAB — RESPIRATORY PANEL BY PCR

## 2015-12-29 LAB — COMPREHENSIVE METABOLIC PANEL
ALT: 23 U/L (ref 14–54)
AST: 24 U/L (ref 15–41)
Albumin: 2.7 g/dL — ABNORMAL LOW (ref 3.5–5.0)
Alkaline Phosphatase: 68 U/L (ref 38–126)
Anion gap: 8 (ref 5–15)
BUN: 8 mg/dL (ref 6–20)
CO2: 24 mmol/L (ref 22–32)
Calcium: 9.3 mg/dL (ref 8.9–10.3)
Chloride: 99 mmol/L — ABNORMAL LOW (ref 101–111)
Creatinine, Ser: 0.87 mg/dL (ref 0.44–1.00)
GFR calc Af Amer: >60 mL/min (ref >60)
GFR calc non Af Amer: >60 mL/min (ref >60)
Glucose, Bld: 89 mg/dL (ref 65–99)
Potassium: 4.4 mmol/L (ref 3.5–5.1)
Sodium: 131 mmol/L — ABNORMAL LOW (ref 135–145)
Total Bilirubin: 0.3 mg/dL (ref 0.3–1.2)
Total Protein: 5.9 g/dL — ABNORMAL LOW (ref 6.5–8.1)

## 2015-12-29 LAB — PHOSPHORUS: Phosphorus: 3.2 mg/dL (ref 2.5–4.6)

## 2015-12-29 LAB — CBC WITH DIFFERENTIAL/PLATELET
Basophils Absolute: 0 10*3/uL (ref 0.0–0.1)
Basophils Relative: 0 %
Eosinophils Absolute: 0.3 10*3/uL (ref 0.0–0.7)
Eosinophils Relative: 3 %
HCT: 33.2 % — ABNORMAL LOW (ref 36.0–46.0)
Hemoglobin: 10.3 g/dL — ABNORMAL LOW (ref 12.0–15.0)
Lymphocytes Relative: 15 %
Lymphs Abs: 1.4 10*3/uL (ref 0.7–4.0)
MCH: 31.1 pg (ref 26.0–34.0)
MCHC: 31 g/dL (ref 30.0–36.0)
MCV: 100.3 fL — ABNORMAL HIGH (ref 78.0–100.0)
Monocytes Absolute: 1.3 10*3/uL — ABNORMAL HIGH (ref 0.1–1.0)
Monocytes Relative: 14 %
Neutro Abs: 6.5 10*3/uL (ref 1.7–7.7)
Neutrophils Relative %: 68 %
Platelets: 267 10*3/uL (ref 150–400)
RBC: 3.31 MIL/uL — ABNORMAL LOW (ref 3.87–5.11)
RDW: 13.7 % (ref 11.5–15.5)
WBC: 9.6 10*3/uL (ref 4.0–10.5)

## 2015-12-29 LAB — STREP PNEUMONIAE URINARY ANTIGEN: Strep Pneumo Urinary Antigen: NEGATIVE

## 2015-12-29 LAB — EXPECTORATED SPUTUM ASSESSMENT W GRAM STAIN, RFLX TO RESP C

## 2015-12-29 LAB — CREATININE, URINE, RANDOM: Creatinine, Urine: 57 mg/dL

## 2015-12-29 LAB — SODIUM, URINE, RANDOM: Sodium, Ur: 39 mmol/L

## 2015-12-29 LAB — MAGNESIUM: Magnesium: 2.1 mg/dL (ref 1.7–2.4)

## 2015-12-29 LAB — OSMOLALITY, URINE: Osmolality, Ur: 307 mOsm/kg (ref 300–900)

## 2015-12-29 LAB — TSH: TSH: 6.301 u[IU]/mL — ABNORMAL HIGH (ref 0.350–4.500)

## 2015-12-29 MED ORDER — AZITHROMYCIN 500 MG PO TABS
500.0000 mg | ORAL_TABLET | Freq: Every day | ORAL | Status: DC
  Administered 2015-12-30: 500 mg via ORAL
  Filled 2015-12-29: qty 1

## 2015-12-29 MED ORDER — DEXTROSE 5 % IV SOLN
2.0000 g | INTRAVENOUS | Status: DC
  Administered 2015-12-30: 2 g via INTRAVENOUS
  Filled 2015-12-29: qty 2

## 2015-12-29 MED ORDER — LEVOTHYROXINE SODIUM 75 MCG PO TABS
75.0000 ug | ORAL_TABLET | Freq: Every day | ORAL | Status: DC
  Administered 2015-12-30: 75 ug via ORAL
  Filled 2015-12-29: qty 1

## 2015-12-29 NOTE — Progress Notes (Signed)
PROGRESS NOTE                                                                                                                                                                                                             Patient Demographics:    Linda Sanders, is a 81 y.o. female, DOB - [birth year], [id number]  Admit date - 12/28/2015   Admitting Physician Toy Baker, MD  Outpatient Primary MD for the patient is DOUGH,ROBERT, MD  LOS - 1  Outpatient Specialists: ID (Dr. Drucilla Schmidt)  No chief complaint on file.      Brief Narrative   81 year old female with history of Mycobacterium avium complex infection since 2009 (difficulty with treatment due to resistance to rifampin and poorly tolerated Avelox and ethambutol). Was again started on treatment in August 2016 with ethambutol and azithromycin but could not tolerate both and was discontinued after 2 months. Patient presented to [facility] ED with cough with greenish phlegm for almost a week. She was started on Bactrim by her PCP few days ago for URI symptoms/sinus infection. She reports very poor by mouth intake and feeling lightheaded. Also reported fever as high as 101F. Reports pleuritic chest pain involving bilateral ribs with coughing. She also had a syncopal episode at home on the day prior to admission. At the [facility] ED, her vitals were stable. A CT angiogram of the chest was done to rule out PE which was negative but showed interval progression of bilateral subpleural nodular groundglass attenuation opacities with tree-in-bud micronodular T, new compared to CT scan done 3 neck ago. 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 ABG done was unremarkable. She had numbness of 11.6. Hospitalist that consulted with ID Dr. Drucilla Schmidt who recommended transferring her to [facility] for further management.     Subjective:   Patient complains of cough  with greenish phlegm. Complains of bilateral rib pain on coughing with feeling of tightness.   Assessment  & Plan :   Principal problem Acute bronchitis versus lobar/atypical pneumonia. Started on empiric Rocephin and azithromycin on admission. Discussed with ID, will obtain two-view chest x-ray to rule out pneumonia. Could possibly be acute symptoms rather than recurrence of MAI. Strep pneumonia antigen negative. Legionella antigen pending. Sputum culture ordered sputum for culture and AFB ordered. RSV panel ordered. Continue antitussives. Percocet and tramadol for pleurisy.  ID consulted for further recommendations. Plan on outpatient treatment for MAI if needed.  Hyponatremia  secondary to dehydration. Monitor with IV fluids. TSH elevated.   Essential hypertension  continue home medication   Hypothyroidism  TSH elevated. We will increase Synthroid dose.   Remote history of breast cancer    Polymyositis and fibromyalgia Continue when necessary pain medications.    Code Status : Full code  Family Communication  : Daughter [name] at bedside  Disposition Plan  : Home possibly in 48 hours  Barriers For Discharge : Active symptoms  Consults  :  ID  Procedures  : None (CT angiogram chest done at outside hospital)  DVT Prophylaxis  :  Lovenox -   Lab Results  Component Value Date   PLT 267 12/29/2015    Antibiotics  :    Anti-infectives    Start     Dose/Rate Route Frequency Ordered Stop   12/30/15 1000  cefTRIAXone (ROCEPHIN) 2 g in dextrose 5 % 50 mL IVPB     2 g 100 mL/hr over 30 Minutes Intravenous Every 24 hours 12/29/15 1418     12/30/15 1000  azithromycin (ZITHROMAX) tablet 500 mg     500 mg Oral Daily 12/29/15 1418     12/29/15 0100  azithromycin (ZITHROMAX) 500 mg in dextrose 5 % 250 mL IVPB  Status:  Discontinued     500 mg 250 mL/hr over 60 Minutes Intravenous Every 24 hours 12/28/15 2309 12/29/15 1045   12/29/15 0000  cefTRIAXone (ROCEPHIN) 1 g in  dextrose 5 % 50 mL IVPB  Status:  Discontinued     1 g 100 mL/hr over 30 Minutes Intravenous Every 24 hours 12/28/15 2309 12/29/15 1045        Objective:   Filed Vitals:   12/28/15 2144 12/29/15 0515 12/29/15 0900  BP: 127/63 121/103 128/97  Pulse: 81 92 90  Temp: 99.9 F (37.7 C) 98.7 F (37.1 C) 98 F (36.7 C)  TempSrc: Oral Oral Oral  Resp: 18 18 18   Weight: 66.497 kg (146 lb 9.6 oz)    SpO2: 97% 97% 98%    Wt Readings from Last 3 Encounters:  12/28/15 66.497 kg (146 lb 9.6 oz)  06/11/15 63.504 kg (140 lb)  03/29/15 64.411 kg (142 lb)     Intake/Output Summary (Last 24 hours) at 12/29/15 1439 Last data filed at 12/29/15 0918  Gross per 24 hour  Intake 888.75 ml  Output    550 ml  Net 338.75 ml     Physical Exam  Gen: not in distress HEENT: no pallor, moist mucosa, supple neck Chest: clear b/l, no added sounds CVS: N S1&S2, no murmurs, rubs or gallop GI: soft, NT, ND, BS+ Musculoskeletal: warm, no edema CNS: AAOX3, non focal    Data Review:    CBC  Recent Labs Lab 12/28/15 2055 12/29/15 0450  WBC 11.8* 9.6  HGB 11.3* 10.3*  HCT 35.2* 33.2*  PLT 289 267  MCV 99.7 100.3*  MCH 32.0 31.1  MCHC 32.1 31.0  RDW 13.5 13.7  LYMPHSABS 1.7 1.4  MONOABS 1.6* 1.3*  EOSABS 0.3 0.3  BASOSABS 0.0 0.0    Chemistries   Recent Labs Lab 12/28/15 2055 12/29/15 0450  NA 129* 131*  K 4.2 4.4  CL 98* 99*  CO2 22 24  GLUCOSE 82 89  BUN 9 8  CREATININE 0.99 0.87  CALCIUM 9.2 9.3  MG  --  2.1  AST 24 24  ALT 25 23  ALKPHOS 70 68  BILITOT 0.5 0.3   ------------------------------------------------------------------------------------------------------------------ No results for input(s): CHOL, HDL, LDLCALC, TRIG, CHOLHDL, LDLDIRECT in the last 72 hours.  No results found for: HGBA1C ------------------------------------------------------------------------------------------------------------------  Recent Labs  12/29/15 0500  TSH 6.301*    ------------------------------------------------------------------------------------------------------------------ No results for input(s): VITAMINB12, FOLATE, FERRITIN, TIBC, IRON, RETICCTPCT in the last 72 hours.  Coagulation profile No results for input(s): INR, PROTIME in the last 168 hours.  No results for input(s): DDIMER in the last 72 hours.  Cardiac Enzymes No results for input(s): CKMB, TROPONINI, MYOGLOBIN in the last 168 hours.  Invalid input(s): CK ------------------------------------------------------------------------------------------------------------------ No results found for: BNP  Inpatient Medications  Scheduled Meds: . [START ON 12/30/2015] azithromycin  500 mg Oral Daily  . carvedilol  12.5 mg Oral BID WC  . [START ON 12/30/2015] cefTRIAXone (ROCEPHIN)  IV  2 g Intravenous Q24H  . enoxaparin (LOVENOX) injection  40 mg Subcutaneous Q24H  . gabapentin  400 mg Oral TID  . guaiFENesin  600 mg Oral BID  . levothyroxine  50 mcg Oral QAC breakfast  . predniSONE  5 mg Oral Q breakfast  . traMADol  50 mg Oral TID   Continuous Infusions:  PRN Meds:.albuterol, methocarbamol, oxyCODONE-acetaminophen  Micro Results No results found for this or any previous visit (from the past 240 hour(s)).  Radiology Reports No results found.  Time Spent in minutes  25   Louellen Molder M.D on 12/29/2015 at 2:39 PM  Between 7am to 7pm - Pager - [phone]  After 7pm go to [email or web address] - password [facility]  [facility] -  Office  [phone]

## 2015-12-29 NOTE — Consult Note (Signed)
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 female  With history of fibromylgia, HTN, polymyositis followed by rheumatology, as well as pulmonary nodular MAC disease initially diagnosed in 2009.  She was treated by Dr. Ola Spurr in May 2010 where she completed more than a year of therapy, though she did have difficulty with tolerance and was taken off of her anti-mycobacterium avium therapy last in December of 2011. Since then she had periodically been seen in the ID clinic from 2012-2016 by myself and Dr. Tommy Medal. Her symptoms were not severe enough for anti-mycobacterial therapy to be initiated at that time up until Fall of 2016. CT findings in Sep 2016 at [facility] suggested continued nodular lung disease, at that time also having productive cough, subjective fevers. Her last pulmonary sputum AFB sensitivies were noted for July 2016: susceptibility to macrolides with the clarithromycin MIC of 2.0 likely resistance to rifampin with an MIC of greater than 8 and ethambutol MIC of 8.0 and moxifloxacin MIC of 2.0: Linezolid of 32 MIC of greater than 64 for streptomycin and amikacin susceptibility of 32.  She was initiated on daily ethambutol, and azithromycin,  but did stop her medications roughly 2 months after initiation due to intolerability.  She now is readmitted to [facility] in June 16th  for syncope. She was diagnosed with upper respiratory infection 2 days prior to hospital admission and given bactrim which caused her to  have nausea/vomiting. She would have spells of syncope, found down at home for 4hr prior to being admitted to [place]. In part of infectious work up, she had repeat Chest CTA that ruled out PE but did showed interval progression of subpleural nodularity scattered groundglass attenuation and opacities consistent with chronic indolent infection in comparison to films from Late Summer 2016.  She was transferred to [facility] for further management.   Prior to this week, she had been recovering from lumbar fusion surgery roughly 8 wks ago. She states that she still has intermittent productive cough, though her symptoms for this past week are different where she states that she has coughing fits, pleuretic rib pain when coughin. She had 2 episodes of near syncope/syncope during the week.  Past Medical History  Diagnosis Date  . Hypertension   . Allergic rhinitis   . Polymyositis ([place])     Dr. Justine Null  . Pulmonary nodule   . Mycobacterium avium complex ([place]) 03/29/2015  . Skull lesion 03/29/2015  . Multiple allergies 03/29/2015  . Ear fullness 06/11/2015  . Blurry vision 06/11/2015  . Nausea without vomiting 06/11/2015  . Diarrhea 06/11/2015  . Pneumonia "several times"  . Chronic bronchitis ([place])   . Hypothyroidism   . GERD (gastroesophageal reflux disease)   . Arthritis     "joints; right hip" (12/28/2015"  . Adenocarcinoma, breast ([place])     Dr. Hinton Rao.  s/p partial mastectomy, left breast xrt, chemo    Allergies:  Allergies  Allergen Reactions  .  Avelox [Moxifloxacin Hcl In Nacl]     Upset stomach   . Clarithromycin     Upset stomach   . Codeine     Vomiting   . Cymbalta [Duloxetine Hcl]     Blurred vision   . Metronidazole And Related     Rash, itching   . Moxifloxacin     Upset stomach   . Amoxicillin-Pot Clavulanate Nausea And Vomiting  . Clarithromycin Diarrhea    MEDICATIONS: . carvedilol  12.5 mg Oral BID WC  . enoxaparin (LOVENOX) injection  40 mg Subcutaneous Q24H  .  gabapentin  400 mg Oral TID  . guaiFENesin  600 mg Oral BID  . levothyroxine  50 mcg Oral QAC breakfast  . predniSONE  5 mg Oral Q breakfast  . traMADol  50 mg Oral TID    Social History  Substance Use Topics  . Smoking status: Never Smoker   . Smokeless tobacco: Never Used  . Alcohol Use: No    Family History  Problem Relation Age of Onset  . Allergies Mother   . Allergies Other     children  . Rheum arthritis Mother   . Lymphoma Sister     Review of Systems  Constitutional: positive for subjective fever, chills, diaphoresis, activity change, appetite change, fatigue and unexpected weight change.  HENT: Negative for congestion, sore throat, rhinorrhea, sneezing, trouble swallowing and sinus pressure.  Eyes: Negative for photophobia and visual disturbance.  Respiratory: poisitive for cough, chest tightness, shortness of breath, wheezing and stridor.  Cardiovascular: Negative for chest pain, palpitations and leg swelling.  Gastrointestinal: Negative for nausea, vomiting, abdominal pain, diarrhea, constipation, blood in stool, abdominal distention and anal bleeding.  Genitourinary: Negative for dysuria, hematuria, flank pain and difficulty urinating.  Musculoskeletal: Negative for myalgias, back pain, joint swelling, arthralgias and gait problem.  Skin: Negative for color change, pallor, rash and wound.  Neurological: Negative for dizziness, tremors, weakness and light-headedness.  Hematological: Negative for adenopathy. Does not bruise/bleed easily.  Psychiatric/Behavioral: Negative for behavioral problems, confusion, sleep disturbance, dysphoric mood, decreased concentration and agitation.     OBJECTIVE: Temp:  [98 F (36.7 C)-99.9 F (37.7 C)] 98 F (36.7 C) (06/17 0900) Pulse Rate:  [81-92] 90 (06/17 0900) Resp:  [18] 18 (06/17 0900) BP: (121-128)/(63-103) 128/97 mmHg (06/17 0900) SpO2:  [97 %-98 %] 98 % (06/17 0900) Weight:  [146 lb 9.6 oz (66.497 kg)] 146 lb 9.6 oz  (66.497 kg) (06/16 2144) Physical Exam  Constitutional:  oriented to person, place, and time. appears well-developed and well-nourished. No distress.  HENT: [place]/AT, PERRLA, no scleral icterus Mouth/Throat: Oropharynx is clear and moist. No oropharyngeal exudate.  Cardiovascular: Normal rate, regular rhythm and normal heart sounds. Exam reveals no gallop and no friction rub.  No murmur heard.  Pulmonary/Chest: Effort normal and breath sounds normal. No respiratory distress.  has no wheezes.  Neck = supple, no nuchal rigidity Abdominal: Soft. Bowel sounds are normal.  exhibits no distension. There is no tenderness.  Lymphadenopathy: no cervical adenopathy. No axillary adenopathy Neurological: alert and oriented to person, place, and time.  Skin: Skin is warm and dry. No rash noted. No erythema.  Psychiatric: appears distracted. Needs frequent redirection   LABS: Results for orders placed or performed during the hospital encounter of 12/28/15 (from the past 48 hour(s))  Comprehensive metabolic panel     Status: Abnormal   Collection Time: 12/28/15  8:55 PM  Result Value Ref Range   Sodium 129 (L) 135 - 145  mmol/L   Potassium 4.2 3.5 - 5.1 mmol/L   Chloride 98 (L) 101 - 111 mmol/L   CO2 22 22 - 32 mmol/L   Glucose, Bld 82 65 - 99 mg/dL   BUN 9 6 - 20 mg/dL   Creatinine, Ser 0.99 0.44 - 1.00 mg/dL   Calcium 9.2 8.9 - 10.3 mg/dL   Total Protein 6.5 6.5 - 8.1 g/dL   Albumin 2.9 (L) 3.5 - 5.0 g/dL   AST 24 15 - 41 U/L   ALT 25 14 - 54 U/L   Alkaline Phosphatase 70 38 - 126 U/L   Total Bilirubin 0.5 0.3 - 1.2 mg/dL   GFR calc non Af Amer 53 (L) >60 mL/min   GFR calc Af Amer >60 >60 mL/min    Comment: (NOTE) The eGFR has been calculated using the CKD EPI equation. This calculation has not been validated in all clinical situations. eGFR's persistently <60 mL/min signify possible Chronic Kidney Disease.    Anion gap 9 5 - 15  CBC with Differential/Platelet     Status: Abnormal    Collection Time: 12/28/15  8:55 PM  Result Value Ref Range   WBC 11.8 (H) 4.0 - 10.5 K/uL   RBC 3.53 (L) 3.87 - 5.11 MIL/uL   Hemoglobin 11.3 (L) 12.0 - 15.0 g/dL   HCT 35.2 (L) 36.0 - 46.0 %   MCV 99.7 78.0 - 100.0 fL   MCH 32.0 26.0 - 34.0 pg   MCHC 32.1 30.0 - 36.0 g/dL   RDW 13.5 11.5 - 15.5 %   Platelets 289 150 - 400 K/uL   Neutrophils Relative % 68 %   Neutro Abs 8.1 (H) 1.7 - 7.7 K/uL   Lymphocytes Relative 15 %   Lymphs Abs 1.7 0.7 - 4.0 K/uL   Monocytes Relative 14 %   Monocytes Absolute 1.6 (H) 0.1 - 1.0 K/uL   Eosinophils Relative 3 %   Eosinophils Absolute 0.3 0.0 - 0.7 K/uL   Basophils Relative 0 %   Basophils Absolute 0.0 0.0 - 0.1 K/uL  Magnesium     Status: None   Collection Time: 12/29/15  4:50 AM  Result Value Ref Range   Magnesium 2.1 1.7 - 2.4 mg/dL  Phosphorus     Status: None   Collection Time: 12/29/15  4:50 AM  Result Value Ref Range   Phosphorus 3.2 2.5 - 4.6 mg/dL  Comprehensive metabolic panel     Status: Abnormal   Collection Time: 12/29/15  4:50 AM  Result Value Ref Range   Sodium 131 (L) 135 - 145 mmol/L   Potassium 4.4 3.5 - 5.1 mmol/L   Chloride 99 (L) 101 - 111 mmol/L   CO2 24 22 - 32 mmol/L   Glucose, Bld 89 65 - 99 mg/dL   BUN 8 6 - 20 mg/dL   Creatinine, Ser 0.87 0.44 - 1.00 mg/dL   Calcium 9.3 8.9 - 10.3 mg/dL   Total Protein 5.9 (L) 6.5 - 8.1 g/dL   Albumin 2.7 (L) 3.5 - 5.0 g/dL   AST 24 15 - 41 U/L   ALT 23 14 - 54 U/L   Alkaline Phosphatase 68 38 - 126 U/L   Total Bilirubin 0.3 0.3 - 1.2 mg/dL   GFR calc non Af Amer >60 >60 mL/min   GFR calc Af Amer >60 >60 mL/min    Comment: (NOTE) The eGFR has been calculated using the CKD EPI equation. This calculation has not been validated in all clinical situations. eGFR's  persistently <60 mL/min signify possible Chronic Kidney Disease.    Anion gap 8 5 - 15  CBC WITH DIFFERENTIAL     Status: Abnormal   Collection Time: 12/29/15  4:50 AM  Result Value Ref Range   WBC 9.6 4.0 -  10.5 K/uL   RBC 3.31 (L) 3.87 - 5.11 MIL/uL   Hemoglobin 10.3 (L) 12.0 - 15.0 g/dL   HCT 33.2 (L) 36.0 - 46.0 %   MCV 100.3 (H) 78.0 - 100.0 fL   MCH 31.1 26.0 - 34.0 pg   MCHC 31.0 30.0 - 36.0 g/dL   RDW 13.7 11.5 - 15.5 %   Platelets 267 150 - 400 K/uL   Neutrophils Relative % 68 %   Neutro Abs 6.5 1.7 - 7.7 K/uL   Lymphocytes Relative 15 %   Lymphs Abs 1.4 0.7 - 4.0 K/uL   Monocytes Relative 14 %   Monocytes Absolute 1.3 (H) 0.1 - 1.0 K/uL   Eosinophils Relative 3 %   Eosinophils Absolute 0.3 0.0 - 0.7 K/uL   Basophils Relative 0 %   Basophils Absolute 0.0 0.0 - 0.1 K/uL  TSH     Status: Abnormal   Collection Time: 12/29/15  5:00 AM  Result Value Ref Range   TSH 6.301 (H) 0.350 - 4.500 uIU/mL  Strep pneumoniae urinary antigen     Status: None   Collection Time: 12/29/15  5:12 AM  Result Value Ref Range   Strep Pneumo Urinary Antigen NEGATIVE NEGATIVE    Comment:        Infection due to S. pneumoniae cannot be absolutely ruled out since the antigen present may be below the detection limit of the test.   Creatinine, urine, random     Status: None   Collection Time: 12/29/15  5:12 AM  Result Value Ref Range   Creatinine, Urine 57.00 mg/dL  Sodium, urine, random     Status: None   Collection Time: 12/29/15  5:12 AM  Result Value Ref Range   Sodium, Ur 39 mmol/L  Osmolality, urine     Status: None   Collection Time: 12/29/15  5:12 AM  Result Value Ref Range   Osmolality, Ur 307 300 - 900 mOsm/kg    MICRO: History micro listed above  Assessment/Plan:  81yo F with history of pulmonary mac with radiographic progressive pulmonary mac infection but presents with dry cough, and episodes of syncope. i feel her current presentation appears to be more consistent with viral or bacterial acute respiratory infection. Her symptoms associated with pulmonary mac appeared stable up to last week  - agree with getting respiratory virus panel. Would get CXR to see if any abn to  suggest acute pneumonia. Possible rib fracture.  For pulmonary mac, we will likely address as outpatient:  - recommend to get repeat induce sputum to send for AFB culture It would be useful to see if still having recent isolate to see if any further resistance is noted. Will also like to have isolate tested for clofazimine sensisitivity  - given her allergy and intolerances to abtx. Our treatment options are very limited.  Since she her isolate is resistant to rifampin - > will need to either re-iniate moxifloxacin until we can get access to clofazimine through IND process to get medication from [facility]  - her regimen will likely be ethambutol 54m/kg daily, and azithromycin 2543mdaily plus  moxi  - recommend to premedicate for nausea. Take on full stomach. While she is here to see if  she tolerates medications

## 2015-12-29 NOTE — Progress Notes (Signed)
Initial Nutrition Assessment  DOCUMENTATION CODES:   Not applicable  INTERVENTION:  Encourage adequate PO intake.   RD to continue to monitor for needs.   NUTRITION DIAGNOSIS:   Increased nutrient needs related to acute illness as evidenced by estimated needs.  GOAL:   Patient will meet greater than or equal to 90% of their needs  MONITOR:   PO intake, Weight trends, Labs, I & O's  REASON FOR ASSESSMENT:   Consult Assessment of nutrition requirement/status  ASSESSMENT:   81 y.o. female with medical history significant of MAC chronic lung infection, breast cancer, polymyositis here with total bili worsening of her MAC chronic lung infection versus community-acquired pneumonia associated dehydration and hyponatremia  Pt reports having a decreased appetite which has been ongoing over the past 1 month. Meal completion however 100%. Pt reports she has been consuming at least 3 meals a day without difficulties. Weight has been stable per pt report.Pt kindly declined nourishment snacks/supplements. Pt reports family will bring her snacks/supplements as needed. Pt encouraged to eat her food at meals.   Pt with no observed significant fat or muscle mass loss.   Labs and medications reviewed.   Diet Order:  Diet Heart Room service appropriate?: Yes; Fluid consistency:: Thin  Skin:  Reviewed, no issues  Last BM:  PTA  Height:   Ht Readings from Last 1 Encounters:  04/25/14 5\' 1"  (1.549 m)    Weight:   Wt Readings from Last 1 Encounters:  12/28/15 146 lb 9.6 oz (66.497 kg)    Ideal Body Weight:  47.7 kg  BMI:  Body mass index is 27.71 kg/(m^2).  Estimated Nutritional Needs:   Kcal:  1650-1850  Protein:  75-85 grams  Fluid:  1.6 - 1.8 L/day  EDUCATION NEEDS:   No education needs identified at this time  Corrin Parker, MS, RD, LDN Pager # [phone] After hours/ weekend pager # [phone]

## 2015-12-29 NOTE — Progress Notes (Signed)
Call placed to weekend  Infection prevention to clarify room placement ,droplet vs resp isolation.

## 2015-12-30 LAB — LEGIONELLA PNEUMOPHILA SEROGP 1 UR AG: L. pneumophila Serogp 1 Ur Ag: NEGATIVE

## 2015-12-30 MED ORDER — ACAPELLA MISC: 1.0000 "application " | Freq: Three times a day (TID) | Status: DC

## 2015-12-30 MED ORDER — GUAIFENESIN-DM 100-10 MG/5ML PO SYRP: 5.0000 mL | ORAL_SOLUTION | ORAL | Status: DC | PRN

## 2015-12-30 MED ORDER — AZITHROMYCIN 500 MG PO TABS: 500.0000 mg | ORAL_TABLET | Freq: Every day | ORAL | Status: AC

## 2015-12-30 MED ORDER — GUAIFENESIN-DM 100-10 MG/5ML PO SYRP
5.0000 mL | ORAL_SOLUTION | ORAL | Status: DC | PRN
  Administered 2015-12-30: 5 mL via ORAL
  Filled 2015-12-30: qty 5

## 2015-12-30 MED ORDER — GUAIFENESIN ER 600 MG PO TB12: 600.0000 mg | ORAL_TABLET | Freq: Two times a day (BID) | ORAL | Status: DC

## 2015-12-30 MED ORDER — LEVOTHYROXINE SODIUM 50 MCG PO TABS: 75.0000 ug | ORAL_TABLET | Freq: Every day | ORAL | Status: DC

## 2015-12-30 MED ORDER — OXYCODONE-ACETAMINOPHEN 5-325 MG PO TABS: 2.0000 | ORAL_TABLET | Freq: Four times a day (QID) | ORAL | Status: DC | PRN

## 2015-12-30 NOTE — Evaluation (Signed)
Physical Therapy Evaluation and Discharge Patient Details Name: Linda Sanders MRN: [id number] DOB: February 17, 1935 Today's Date: 12/30/2015   History of Present Illness  Linda Sanders is a 81 y.o. female Presented with worsening cough--MAC chronic lung infection versus community-acquired pneumonia associated dehydration and hyponatremia. Significant PMHx- back surgery 8 weeks prior, MAC chronic lung infection, breast cancer, polymyositis followed by rheumatology    Clinical Impression  Patient evaluated by Physical Therapy with no further acute PT needs identified. Patient is mobilizing independently with no device, no loss of balance, and with SaO2 94% on room air. Patient recently completed HHPT s/p back surgery and agrees she does not need any further therapy. All education has been completed and the patient has no further questions. PT is signing off. Thank you for this referral.     Follow Up Recommendations No PT follow up;Supervision - Intermittent (for community needs)    Equipment Recommendations  None recommended by PT    Recommendations for Other Services       Precautions / Restrictions Precautions Precautions: None      Mobility  Bed Mobility Overal bed mobility: Modified Independent             General bed mobility comments: incr effort; adhered to back precautions independently  Transfers Overall transfer level: Independent Equipment used: None             General transfer comment: x 3  Ambulation/Gait Ambulation/Gait assistance: Independent Ambulation Distance (Feet): 200 Feet Assistive device: None Gait Pattern/deviations: Step-through pattern;Decreased stride length (decr trunk rotation; almost no arm swing)     General Gait Details: able to maintain straight path with head turns, no LOB sudden stops, pivot turns  Science writer    Modified Rankin (Stroke Patients Only)       Balance Overall balance  assessment: Independent;History of Falls (reports recent syncopal spells (dizzy, "legs folded under me)                                           Pertinent Vitals/Pain Pain Assessment: Faces Faces Pain Scale: Hurts even more Pain Location: ribs with coughing Pain Descriptors / Indicators: Jabbing Pain Intervention(s): Limited activity within patient's tolerance;Repositioned (educated to use pillow to splint)    Home Living Family/patient expects to be discharged to:: Private residence Living Arrangements: Alone Available Help at Discharge: Family;Available PRN/intermittently Type of Home: House Home Access: Stairs to enter   Entrance Stairs-Number of Steps: 1 Home Layout: One level Home Equipment: Walker - 2 wheels;Cane - single point;Shower seat - built in;Grab bars - tub/shower;Bedside commode (3n1 over toilet since back surgery)      Prior Function Level of Independence: Needs assistance   Gait / Transfers Assistance Needed: assist with community needs since back surgery 8 weeks ago; prior to that independent           Hand Dominance        Extremity/Trunk Assessment   Upper Extremity Assessment: Overall WFL for tasks assessed           Lower Extremity Assessment: Overall WFL for tasks assessed      Cervical / Trunk Assessment: Normal  Communication   Communication: No difficulties  Cognition Arousal/Alertness: Awake/alert Behavior During Therapy: WFL for tasks assessed/performed Overall Cognitive Status: Within Functional Limits for tasks assessed  General Comments      Exercises        Assessment/Plan    PT Assessment Patent does not need any further PT services  PT Diagnosis     PT Problem List    PT Treatment Interventions     PT Goals (Current goals can be found in the Care Plan section) Acute Rehab PT Goals PT Goal Formulation: All assessment and education complete, DC therapy     Frequency     Barriers to discharge        Co-evaluation               End of Session Equipment Utilized During Treatment: Gait belt Activity Tolerance: Patient tolerated treatment well Patient left: in chair;with call bell/phone within reach Nurse Communication: Mobility status;Other (comment) (Ok to d/c per PT)         Time: 1335-1405 PT Time Calculation (min) (ACUTE ONLY): 30 min   Charges:   PT Evaluation $PT Eval Low Complexity: 1 Procedure PT Treatments $Gait Training: 8-22 mins   PT G Codes:        Prajwal Fellner January 06, 2016, 2:17 PM

## 2015-12-30 NOTE — Discharge Summary (Signed)
Physician Discharge Summary  Linda Sanders [id number] DOB: Apr 29, 1935 DOA: 12/28/2015  PCP: Teressa Lower, MD  Admit date: 12/28/2015 Discharge date: 12/30/2015  Admitted From: Home Disposition:  Home  Recommendations for Outpatient Follow-up:  1. Follow up with PCP in 1-2 weeks. Follow Results of sputum culture and AFB. Follow Legionella antibody. Completes antibiotics on 6/21. 2. Follow-up with ID Dr. Graylon Good in 2 weeks 3.  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 Was again started on treatment in August 2016 with ethambutol and azithromycin but could not tolerate both and was discontinued after 2 months. Patient presented to [facility] ED with cough with greenish phlegm for almost a week. She was started on Bactrim by her PCP few days ago for URI symptoms/sinus infection. She reports very poor by mouth intake and feeling lightheaded. Also reported fever as high as 101F. Reports pleuritic chest pain involving bilateral ribs with coughing. She also had a syncopal episode at home on the day prior to admission. At the [facility] ED, her vitals were stable. A CT angiogram of the chest was done to rule out PE which was negative but showed interval progression of bilateral subpleural nodular groundglass attenuation opacities with tree-in-bud micronodularity ,  new  compared to CT scan done 3 neck ago. 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 ABG done was unremarkable. WBC was 11.6. Hospitalist  consulted with ID Dr. Drucilla Schmidt who recommended transferring her to [facility] for further management.  Hospital course Principal problem Acute bronchitis versus lobar/atypical pneumonia. Started on empiric Rocephin and azithromycin on admission. Discussed with ID, two-view chest x-ray obtained shows multiple persistent bilateral linear and nodular airspace opacities unchanged from prior studies. Also concerning for ongoing multifocal pneumonia. Strep pneumonia antigen negative. Legionella antigen pending. - sputum for culture and AFB ordered. RSV panel was negative Continue antitussives. Percocet and tramadol for pleurisy. Prescribed Acapella  device to improve  expectoration. ID insult appreciated. Plan on outpatient treatment for MAI .  Hyponatremia  secondary to dehydration. Improved with fluids. Follow-up as outpatient.   Essential hypertension  continue home medication   Hypothyroidism TSH elevated.  will increase Synthroid dose to 75 g daily.   Remote history of breast cancer    Polymyositis and fibromyalgia Continue when necessary pain medications.  Blood pressure remained stable. No orthostatic symptoms. Patient seen by physical therapy and has been able to ambulate without difficulty. No home health needs.  Code Status : Full code  Family Communication : Daughter [name] at bedside  Disposition Plan : Home     Consults : ID  Procedures : None (CT angiogram chest done at outside hospital)   Discharge Instructions     Medication List    STOP taking these medications        benzonatate 200 MG capsule  Commonly known as:  TESSALON      TAKE these medications        ACAPELLA Misc  1 application by Does not  apply route 3 (three) times daily.     acetaminophen 325 MG tablet  Commonly known as:  TYLENOL  Take  650 mg by mouth every 6 (six) hours as needed for mild pain.     azithromycin 500 MG tablet  Commonly known as:  ZITHROMAX  Take 1 tablet (500 mg total) by mouth daily.  Start taking on:  12/31/2015     calcium carbonate 500 MG chewable tablet  Commonly known as:  TUMS - dosed in mg elemental calcium  Chew 1 tablet by mouth daily as needed for indigestion or heartburn.     carbamide peroxide 6.5 % otic solution  Commonly known as:  DEBROX  Place 5 drops into both ears 2 (two) times daily.     carvedilol 12.5 MG tablet  Commonly known as:  COREG  Take 12.5 mg by mouth 2 (two) times daily.     famotidine-calcium carbonate-magnesium hydroxide 10-800-165 MG chewable tablet  Commonly known as:  PEPCID COMPLETE  Chew 1 tablet by mouth daily.     gabapentin 400 MG capsule  Commonly known as:  NEURONTIN  Take 400 mg by mouth 3 (three) times daily.     guaiFENesin 600 MG 12 hr tablet  Commonly known as:  MUCINEX  Take 1 tablet (600 mg total) by mouth 2 (two) times daily.     guaiFENesin-dextromethorphan 100-10 MG/5ML syrup  Commonly known as:  ROBITUSSIN DM  Take 5 mLs by mouth every 4 (four) hours as needed for cough.     levothyroxine 50 MCG tablet  Commonly known as:  SYNTHROID, LEVOTHROID  Take 75 mcg by mouth daily.     multivitamin capsule  Take 1 capsule by mouth daily.     oxyCODONE-acetaminophen 5-325 MG tablet  Commonly known as:  PERCOCET/ROXICET  Take 2 tablets by mouth every 6 (six) hours as needed for moderate pain.     predniSONE 5 MG tablet  Commonly known as:  DELTASONE  Take 5 mg by mouth daily.     SYSTANE FREE OP  Place 1 drop into both eyes daily.     traMADol 50 MG tablet  Commonly known as:  ULTRAM  Take 50 mg by mouth 3 (three) times daily. Taking every day per patient     Vitamin D3 50000 units Caps  Take 1 capsule by mouth once a week. No specific day           Follow-up Information    Follow up with DOUGH,ROBERT, MD In 1 week.    Specialty:  Family Medicine   Contact information:   [address] [place] [address] [phone]       Schedule an appointment as soon as possible for a visit with Carlyle Basques, MD.   Specialty:  Infectious Diseases   Contact information:   [address] [place] [address] [phone]      Allergies  Allergen Reactions  . Avelox [Moxifloxacin Hcl In Nacl]     Upset stomach   . Clarithromycin     Upset stomach   . Codeine     Vomiting   . Cymbalta [Duloxetine Hcl]     Blurred vision   . Metronidazole And Related     Rash, itching   . Moxifloxacin     Upset stomach   . Amoxicillin-Pot Clavulanate Nausea And Vomiting  . Clarithromycin Diarrhea      Procedures/Studies: Dg Chest 2 View  12/29/2015  CLINICAL DATA:  Pneumonia, weakness and  fever. EXAM: CHEST  2 VIEW COMPARISON:  CT of the chest 12/28/2015 FINDINGS: Cardiomediastinal silhouette is normal. Mediastinal contours appear intact. There is no evidence of pneumothorax. There are multiple bilateral linear nodular airspace opacities, not significantly changed from the prior radiograph. Coarsening of the interstitial markings is noted, stable. There is pleural thickening and pleural reflection bilaterally. No definite pleural effusions. Osseous structures are without acute abnormality. Soft tissues are grossly normal. IMPRESSION: Multiple persistent bilateral linear and nodular airspace opacities, not significantly changed from the comparison study, on the background of chronic interstitial lung changes. Findings concerning for ongoing multifocal pneumonia. Electronically Signed   By: Fidela Salisbury M.D.   On: 12/29/2015 16:24     Gen: not in distress HEENT: moist mucosa, supple neck Chest: clear b/l, no added sounds CVS: N S1&S2, no murmurs, rubs or gallop GI: soft, NT, ND, BS+ Musculoskeletal: warm, no edema CNS: AAOX3, non focal  Subjective:   Discharge Exam: Filed Vitals:    12/30/15 0548 12/30/15 0930  BP: 144/120 140/90  Pulse: 72 84  Temp: 98.3 F (36.8 C) 98.3 F (36.8 C)  Resp: 18 18   Filed Vitals:   12/29/15 1752 12/29/15 2111 12/30/15 0548 12/30/15 0930  BP: 124/75 132/59 144/120 140/90  Pulse: 82 94 72 84  Temp:  99.4 F (37.4 C) 98.3 F (36.8 C) 98.3 F (36.8 C)  TempSrc:  Oral Oral Oral  Resp:  20 18 18   Weight:   65.953 kg (145 lb 6.4 oz)   SpO2:  94% 97%     General: Pt is alert, awake, not in acute distress Cardiovascular: RRR, S1/S2 +, no rubs, no gallops Respiratory: CTA bilaterally, no wheezing, no rhonchi Abdominal: Soft, NT, ND, bowel sounds + Extremities: no edema, no cyanosis    The results of significant diagnostics from this hospitalization (including imaging, microbiology, ancillary and laboratory) are listed below for reference.     Microbiology: Recent Results (from the past 240 hour(s))  Respiratory Panel by PCR     Status: None   Collection Time: 12/29/15  1:44 AM  Result Value Ref Range Status   Adenovirus NOT DETECTED NOT DETECTED Final   Coronavirus 229E NOT DETECTED NOT DETECTED Final   Coronavirus HKU1 NOT DETECTED NOT DETECTED Final   Coronavirus NL63 NOT DETECTED NOT DETECTED Final   Coronavirus OC43 NOT DETECTED NOT DETECTED Final   Metapneumovirus NOT DETECTED NOT DETECTED Final   Rhinovirus / Enterovirus NOT DETECTED NOT DETECTED Final   Influenza A NOT DETECTED NOT DETECTED Final   Influenza A H1 NOT DETECTED NOT DETECTED Final   Influenza A H1 2009 NOT DETECTED NOT DETECTED Final   Influenza A H3 NOT DETECTED NOT DETECTED Final   Influenza B NOT DETECTED NOT DETECTED Final   Parainfluenza Virus 1 NOT DETECTED NOT DETECTED Final   Parainfluenza Virus 2 NOT DETECTED NOT DETECTED Final   Parainfluenza Virus 3 NOT DETECTED NOT DETECTED Final   Parainfluenza Virus 4 NOT DETECTED NOT DETECTED Final   Respiratory Syncytial Virus NOT DETECTED NOT DETECTED Final   Bordetella pertussis NOT DETECTED NOT  DETECTED Final   Chlamydophila pneumoniae NOT DETECTED NOT DETECTED Final   Mycoplasma pneumoniae NOT DETECTED NOT DETECTED Final  Culture, expectorated sputum-assessment     Status: None   Collection Time: 12/29/15  5:11 PM  Result Value Ref Range Status   Specimen Description INDUCED SPUTUM  Final   Special Requests Immunocompromised  Final   Sputum evaluation   Final  THIS SPECIMEN IS ACCEPTABLE. RESPIRATORY CULTURE REPORT TO FOLLOW.   Report Status 12/29/2015 FINAL  Final  Culture, respiratory (NON-Expectorated)     Status: None (Preliminary result)   Collection Time: 12/29/15  5:11 PM  Result Value Ref Range Status   Specimen Description INDUCED SPUTUM  Final   Special Requests NONE  Final   Gram Stain   Final    ABUNDANT WBC PRESENT, PREDOMINANTLY PMN RARE SQUAMOUS EPITHELIAL CELLS PRESENT FEW GRAM VARIABLE ROD RARE YEAST    Culture TOO YOUNG TO READ  Final   Report Status PENDING  Incomplete     Labs: BNP (last 3 results) No results for input(s): BNP in the last 8760 hours. Basic Metabolic Panel:  Recent Labs Lab 12/28/15 2055 12/29/15 0450  NA 129* 131*  K 4.2 4.4  CL 98* 99*  CO2 22 24  GLUCOSE 82 89  BUN 9 8  CREATININE 0.99 0.87  CALCIUM 9.2 9.3  MG  --  2.1  PHOS  --  3.2   Liver Function Tests:  Recent Labs Lab 12/28/15 2055 12/29/15 0450  AST 24 24  ALT 25 23  ALKPHOS 70 68  BILITOT 0.5 0.3  PROT 6.5 5.9*  ALBUMIN 2.9* 2.7*   No results for input(s): LIPASE, AMYLASE in the last 168 hours. No results for input(s): AMMONIA in the last 168 hours. CBC:  Recent Labs Lab 12/28/15 2055 12/29/15 0450  WBC 11.8* 9.6  NEUTROABS 8.1* 6.5  HGB 11.3* 10.3*  HCT 35.2* 33.2*  MCV 99.7 100.3*  PLT 289 267   Cardiac Enzymes: No results for input(s): CKTOTAL, CKMB, CKMBINDEX, TROPONINI in the last 168 hours. BNP: Invalid input(s): POCBNP CBG: No results for input(s): GLUCAP in the last 168 hours. D-Dimer No results for input(s): DDIMER in  the last 72 hours. Hgb A1c No results for input(s): HGBA1C in the last 72 hours. Lipid Profile No results for input(s): CHOL, HDL, LDLCALC, TRIG, CHOLHDL, LDLDIRECT in the last 72 hours. Thyroid function studies  Recent Labs  12/29/15 0500  TSH 6.301*   Anemia work up No results for input(s): VITAMINB12, FOLATE, FERRITIN, TIBC, IRON, RETICCTPCT in the last 72 hours. Urinalysis No results found for: COLORURINE, APPEARANCEUR, [place], [place], [place], [place], BILIRUBINUR, [place], PROTEINUR, UROBILINOGEN, NITRITE, LEUKOCYTESUR Sepsis Labs Invalid input(s): PROCALCITONIN,  WBC,  LACTICIDVEN Microbiology Recent Results (from the past 240 hour(s))  Respiratory Panel by PCR     Status: None   Collection Time: 12/29/15  1:44 AM  Result Value Ref Range Status   Adenovirus NOT DETECTED NOT DETECTED Final   Coronavirus 229E NOT DETECTED NOT DETECTED Final   Coronavirus HKU1 NOT DETECTED NOT DETECTED Final   Coronavirus NL63 NOT DETECTED NOT DETECTED Final   Coronavirus OC43 NOT DETECTED NOT DETECTED Final   Metapneumovirus NOT DETECTED NOT DETECTED Final   Rhinovirus / Enterovirus NOT DETECTED NOT DETECTED Final   Influenza A NOT DETECTED NOT DETECTED Final   Influenza A H1 NOT DETECTED NOT DETECTED Final   Influenza A H1 2009 NOT DETECTED NOT DETECTED Final   Influenza A H3 NOT DETECTED NOT DETECTED Final   Influenza B NOT DETECTED NOT DETECTED Final   Parainfluenza Virus 1 NOT DETECTED NOT DETECTED Final   Parainfluenza Virus 2 NOT DETECTED NOT DETECTED Final   Parainfluenza Virus 3 NOT DETECTED NOT DETECTED Final   Parainfluenza Virus 4 NOT DETECTED NOT DETECTED Final   Respiratory Syncytial Virus NOT DETECTED NOT DETECTED Final   Bordetella pertussis NOT DETECTED NOT  DETECTED Final   Chlamydophila pneumoniae NOT DETECTED NOT DETECTED Final   Mycoplasma pneumoniae NOT DETECTED NOT DETECTED Final  Culture, expectorated sputum-assessment     Status: None   Collection Time:  12/29/15  5:11 PM  Result Value Ref Range Status   Specimen Description INDUCED SPUTUM  Final   Special Requests Immunocompromised  Final   Sputum evaluation   Final    THIS SPECIMEN IS ACCEPTABLE. RESPIRATORY CULTURE REPORT TO FOLLOW.   Report Status 12/29/2015 FINAL  Final  Culture, respiratory (NON-Expectorated)     Status: None (Preliminary result)   Collection Time: 12/29/15  5:11 PM  Result Value Ref Range Status   Specimen Description INDUCED SPUTUM  Final   Special Requests NONE  Final   Gram Stain   Final    ABUNDANT WBC PRESENT, PREDOMINANTLY PMN RARE SQUAMOUS EPITHELIAL CELLS PRESENT FEW GRAM VARIABLE ROD RARE YEAST    Culture TOO YOUNG TO READ  Final   Report Status PENDING  Incomplete     Time coordinating discharge: < 30 minutes  SIGNED:   Louellen Molder, MD  [facility] 12/30/2015, 3:02 PM Pager   If 7PM-7AM, please contact night-coverage [email or web address] Password [email or web address]

## 2016-01-01 ENCOUNTER — Encounter: Payer: Self-pay | Admitting: Internal Medicine

## 2016-01-01 LAB — CULTURE, RESPIRATORY W GRAM STAIN: Culture: NORMAL

## 2016-01-01 LAB — ACID FAST SMEAR (AFB, MYCOBACTERIA): Acid Fast Smear: NEGATIVE

## 2016-01-14 ENCOUNTER — Ambulatory Visit (INDEPENDENT_AMBULATORY_CARE_PROVIDER_SITE_OTHER): Payer: Medicare Other | Admitting: Infectious Disease

## 2016-01-14 VITALS — BP 128/85 | HR 64 | Temp 98.1°F | Wt 138.0 lb

## 2016-01-14 DIAGNOSIS — M3320 Polymyositis, organ involvement unspecified: Secondary | ICD-10-CM

## 2016-01-14 DIAGNOSIS — M899 Disorder of bone, unspecified: Secondary | ICD-10-CM | POA: Diagnosis not present

## 2016-01-14 DIAGNOSIS — I10 Essential (primary) hypertension: Secondary | ICD-10-CM

## 2016-01-14 DIAGNOSIS — A310 Pulmonary mycobacterial infection: Secondary | ICD-10-CM | POA: Diagnosis not present

## 2016-01-14 NOTE — Progress Notes (Signed)
Chief complaint : weakness, followup for M avium Subjective:    Patient ID: Linda Sanders, female    DOB: 1934-11-24, 81 y.o.   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 She was then followed by former partner Dr. Ola Spurr in May 2010. She completed more than a year of therapy it appears. She did have difficulty with tolerance and was taken off of her anti-mycobacterium avium therapy last in December of 2011. I saw her myself in 2012 and then she has done has seen Dr. Baxter Flattery twice in this past year in 2015. Symptoms were not severe enough for anti-mycobacterial therapy to be initiated.  Since having been last seen by Dr. Baxter Flattery the patient had developed worsening daily \\daily  cough with productive sputum, with green flecks. She coughs throughout the day but without much sputum production, occ white sticky material. She has had subjective fevers and with a temperature no higher than 99 degrees. Weight is going up.  She has had a CT scan done at [facility] that continued to show or nodular lung disease. She was able to produce sputum at [facility] that was sent for AFB culture and which has grown Mycobacterium avium species. Sensitivities are shown below in the image and shows susceptibility to macrolides with the clarithromycin MIC of 2.0 likely resistance to rifampin with an MIC of greater than 8 and ethambutol MIC of 8.0 and moxifloxacin MIC of 2.0: Linezolid of 32 MIC of greater than 64 for streptomycin and amikacin susceptibility of 32.       He apparently has had severe rash in the past 2 moxifloxacin and cannot tolerate this along with nausea and vomiting.   Her allergies to clarithromycin and Augmentin as well as metronidazole are more nausea diarrhea rather genuine allergies.  Several months ago I  opted to  restart her on azithromycin 500mg  and ETH at 15mg /kg daily but since then she has had worsening nausea, diarrhea, ear fullness, blurry vision, myalgias, malaise and has stopped the antibiotics about a week prior to her seeing me in November. This caused improvement in many of her symptoms.  She remained off these meds and continued to have daily cough but did not feel it was worth going back on meds. She had spinal surgery and had weakness after this. She fell twice and ultimately was admitted where he had CT scan done that showed worsening pulmonary nodules. She grew M. Avium again from sputum there and here. She also grew H flu and mold (latter of dubious of consequence).  She has completed azithromycin course and feels better. She is feeling better and coughing daily but better than before.  She is not ready to start on meds at this point though we are considering trying azithromycin, ethambutol, and clofazimine via CDC.  She is adamant she will not tolerate moxifloxacin.    Past Medical History  Diagnosis Date  . Hypertension   . Allergic rhinitis   . Polymyositis ([place])     Dr. Justine Null  . Pulmonary nodule   . 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 Skull lesion 03/29/2015  . Multiple allergies 03/29/2015  . Ear fullness 06/11/2015  . Blurry vision 06/11/2015  . Nausea without vomiting 06/11/2015  . Diarrhea 06/11/2015  . Pneumonia "several times"  . Chronic bronchitis ([place])   . Hypothyroidism   .  GERD (gastroesophageal reflux disease)   . Arthritis     "joints; right hip" (12/28/2015"  . 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 Hinton Rao.  s/p partial mastectomy, left breast xrt, chemo   Past Surgical History  Procedure Laterality Date  . Appendectomy    . Tubal ligation    . Nasal sinus surgery    . Laparoscopic cholecystectomy    . Mastectomy, partial Left   . Breast biopsy Left   . Posterior lumbar fusion  10/2015    "have screws and mesh"  . Vaginal hysterectomy    .  Dilation and curettage of uterus    . Cataract extraction w/ intraocular lens  implant, bilateral Bilateral    Family History  Problem Relation Age of Onset  . Allergies Mother   . Allergies Other     children  . Rheum arthritis Mother   . Lymphoma Sister    Social History  Substance Use Topics  . Smoking status: Never Smoker   . Smokeless tobacco: Never Used  . Alcohol Use: No     Current outpatient prescriptions:  .  acetaminophen (TYLENOL) 325 MG tablet, Take 650 mg by mouth every 6 (six) hours as needed for mild pain. , Disp: , Rfl:  .  calcium carbonate (TUMS - DOSED IN MG ELEMENTAL CALCIUM) 500 MG chewable tablet, Chew 1 tablet by mouth daily as needed for indigestion or heartburn., Disp: , Rfl:  .  carbamide peroxide (DEBROX) 6.5 % otic solution, Place 5 drops into both ears 2 (two) times daily., Disp: 15 mL, Rfl: 5 .  carvedilol (COREG) 12.5 MG tablet, Take 12.5 mg by mouth 2 (two) times daily., Disp: , Rfl:  .  Cholecalciferol (VITAMIN D3) 50000 UNITS CAPS, Take 1 capsule by mouth once a week. No specific day, Disp: , Rfl:  .  famotidine-calcium carbonate-magnesium hydroxide (PEPCID COMPLETE) 10-800-165 MG chewable tablet, Chew 1 tablet by mouth daily. , Disp: , Rfl:  .  gabapentin (NEURONTIN) 400 MG capsule, Take 400 mg by mouth 3 (three) times daily. , Disp: , Rfl:  .  levothyroxine (SYNTHROID, LEVOTHROID) 50 MCG tablet, Take 1.5 tablets (75 mcg total) by mouth daily., Disp: 60 tablet, Rfl: 0 .  Multiple Vitamin (MULTIVITAMIN) capsule, Take 1 capsule by mouth daily.  , Disp: , Rfl:  .  oxyCODONE-acetaminophen (PERCOCET/ROXICET) 5-325 MG tablet, Take 2 tablets by mouth every 6 (six) hours as needed for moderate pain., Disp: 15 tablet, Rfl: 0 .  Polyethyl Glycol-Propyl Glycol (SYSTANE FREE OP), Place 1 drop into both eyes daily., Disp: , Rfl:  .  predniSONE (DELTASONE) 5 MG tablet, Take 5 mg by mouth daily. , Disp: , Rfl:  .  traMADol (ULTRAM) 50 MG tablet, Take 50 mg by  mouth 3 (three) times daily. Taking every day per patient, Disp: , Rfl:  .  guaiFENesin (MUCINEX) 600 MG 12 hr tablet, Take 1 tablet (600 mg total) by mouth 2 (two) times daily. (Patient not taking: Reported on 01/14/2016), Disp: 15 tablet, Rfl: 0 .  guaiFENesin-dextromethorphan (ROBITUSSIN DM) 100-10 MG/5ML syrup, Take 5 mLs by mouth every 4 (four) hours as needed for cough. (Patient not taking: Reported on 01/14/2016), Disp: 118 mL, Rfl: 0 .  Misc. Devices (ACAPELLA) MISC, 1 application by Does not apply route 3 (three) times daily. (Patient not taking: Reported on 01/14/2016), Disp: 1 each, Rfl: 0  Allergies  Allergen Reactions  . Avelox [Moxifloxacin Hcl In Nacl]     Upset stomach   .  Clarithromycin     Upset stomach   . Codeine     Vomiting   . Cymbalta [Duloxetine Hcl]     Blurred vision   . Metronidazole And Related     Rash, itching   . Moxifloxacin     Upset stomach   . Amoxicillin-Pot Clavulanate Nausea And Vomiting  . Clarithromycin Diarrhea     Review of Systems  Constitutional: Positive for fatigue. Negative for fever, chills, diaphoresis, activity change and unexpected weight change.  HENT: Negative for rhinorrhea, sneezing, sore throat and trouble swallowing.   Eyes: Negative for photophobia.  Respiratory: Positive for cough. Negative for chest tightness, shortness of breath, wheezing and stridor.   Cardiovascular: Negative for chest pain, palpitations and leg swelling.  Gastrointestinal: Negative for vomiting, constipation, blood in stool, abdominal distention and anal bleeding.  Genitourinary: Negative for dysuria, hematuria, flank pain and difficulty urinating.  Musculoskeletal: Negative for back pain, joint swelling, arthralgias and gait problem.  Skin: Negative for color change, pallor, rash and wound.  Neurological: Positive for weakness. Negative for dizziness, tremors and light-headedness.  Hematological: Negative for adenopathy. Does not bruise/bleed easily.    Psychiatric/Behavioral: Negative for behavioral problems, confusion, sleep disturbance, dysphoric mood, decreased concentration and agitation.       Objective:   Physical Exam  Constitutional: She is oriented to person, place, and time. She appears well-developed and well-nourished. No distress.  HENT:  Head: Normocephalic and atraumatic.  Mouth/Throat: No oropharyngeal exudate.  Both ears with copious wax buildup  Eyes: Conjunctivae and EOM are normal. No scleral icterus.  Neck: Normal range of motion. Neck supple.  Cardiovascular: Normal rate, regular rhythm and normal heart sounds.  Exam reveals no gallop and no friction rub.   No murmur heard. Pulmonary/Chest: Effort normal and breath sounds normal. No respiratory distress. She has no wheezes. She has no rales.  Prolonged expiratory phase  Abdominal: Soft. She exhibits no distension.  Musculoskeletal: She exhibits no edema or tenderness.  Neurological: She is alert and oriented to person, place, and time. She exhibits normal muscle tone. Coordination normal.  Skin: Skin is warm and dry. No rash noted. She is not diaphoretic. No erythema. No pallor.  Psychiatric: Her behavior is normal. Judgment and thought content normal. She exhibits a depressed mood.          Assessment & Plan:    Nodular pulmonary M avium:  --see above discussion --we will hold off on starting treatment and have her come back in 6 weeks. If she has worsening symptoms likely start azithromycin and ETH and then try to get clofazimine   Multiple allergies: Viewed in many of these are not actual allergies but intolerance   Polymyositis: seeing Rheumatology  Skull lesion noted on PET scan in 2014 followed by oncology with her history of breast cancer  PNA: better post zpack. This could have been an acute on chronic flare of the MAI as well   We  spent greater than 40 minutes with the patient including greater than 50% of time in face to face counsel  of the patient re her nodular lung disease, nodular MAI, recent pneumonia, mold colonization, falls and in coordination of her  [place], MD

## 2016-01-18 LAB — AFB ORGANISM ID BY DNA PROBE
M avium complex: POSITIVE — AB
M tuberculosis complex: NEGATIVE

## 2016-01-18 LAB — ACID FAST CULTURE WITH REFLEXED SENSITIVITIES (MYCOBACTERIA): Acid Fast Culture: POSITIVE — AB

## 2016-01-18 LAB — MAC SUSCEPTIBILITY BROTH
Amikacin: 8
Ciprofloxacin: >16
Clarithromycin: 4
Ethambutol: 4
Linezolid: 64
Moxifloxacin: 4
Rifampin: 8
Streptomycin: 16

## 2016-02-14 ENCOUNTER — Encounter: Payer: Self-pay | Admitting: Infectious Disease

## 2016-03-24 ENCOUNTER — Ambulatory Visit (INDEPENDENT_AMBULATORY_CARE_PROVIDER_SITE_OTHER): Payer: Medicare Other | Admitting: Internal Medicine

## 2016-03-24 ENCOUNTER — Encounter: Payer: Self-pay | Admitting: Internal Medicine

## 2016-03-24 ENCOUNTER — Ambulatory Visit
Admission: RE | Admit: 2016-03-24 | Discharge: 2016-03-24 | Disposition: A | Discharge status: Home / Self Care | Payer: Medicare Other | Source: Ambulatory Visit | Attending: Internal Medicine | Admitting: Internal Medicine

## 2016-03-24 VITALS — BP 149/88 | HR 61 | Temp 98.1°F | Wt 144.0 lb

## 2016-03-24 DIAGNOSIS — A310 Pulmonary mycobacterial infection: Secondary | ICD-10-CM

## 2016-03-24 NOTE — Progress Notes (Signed)
Patient ID: Linda Sanders, female   DOB: [birth year], 81 y.o.   MRN: [id number]  HPI She was seen by Dr. Tommy Medal after her hospitalizaiton. At that visit on 7/3, decision was deferred to start MAC treatment. (old isolates: clarithro S, emb 9, linezolid R, moxi 2, rif R, ami R, strep R). If we were to start pulm MAC  MAC isolated on 6/17 Comments: Mycobacterium avium complex  Amikacin 8.0 ug/mL  Ciprofloxacin >16.0 ug/mL  Clarithromycin 4.0 ug/mL Susceptible  Ethambutol 4.0 ug/mL  Linezolid 64.0 ug/mL  Moxifloxacin 4.0 ug/mL  Rifampin 8.0 ug/mL  Streptomycin 16.0 ug/mL    She was started on ethambutol, azithromycin clofaz Outpatient Encounter Prescriptions as of 03/24/2016  Medication Sig  . acetaminophen (TYLENOL) 325 MG tablet Take 650 mg by mouth every 6 (six) hours as needed for mild pain.   . calcium carbonate (TUMS - DOSED IN MG ELEMENTAL CALCIUM) 500 MG chewable tablet Chew 1 tablet by mouth daily as needed for indigestion or heartburn.  . carbamide peroxide (DEBROX) 6.5 % otic solution Place 5 drops into both ears 2 (two) times daily.  . carvedilol (COREG) 12.5 MG tablet Take 12.5 mg by mouth 2 (two) times daily.  . Cholecalciferol (VITAMIN D3) 50000 UNITS CAPS Take 1 capsule by mouth once a week. No specific day  . famotidine-calcium carbonate-magnesium hydroxide (PEPCID COMPLETE) 10-800-165 MG chewable tablet Chew 1 tablet by mouth daily.   Marland Kitchen gabapentin (NEURONTIN) 400 MG capsule Take 400 mg by mouth 3 (three) times daily.   Marland Kitchen guaiFENesin (MUCINEX) 600 MG 12 hr tablet Take 1 tablet (600 mg total) by mouth 2 (two) times daily.  Marland Kitchen guaiFENesin-dextromethorphan (ROBITUSSIN DM) 100-10 MG/5ML syrup Take 5 mLs by mouth every 4 (four) hours as needed for cough.  . levothyroxine (SYNTHROID, LEVOTHROID) 50 MCG tablet Take 1.5 tablets (75 mcg total) by mouth daily.  . mirtazapine (REMERON) 30 MG tablet Take 15 mg by mouth at bedtime.  . Misc. Devices (ACAPELLA) MISC 1  application by Does not apply route 3 (three) times daily.  . Multiple Vitamin (MULTIVITAMIN) capsule Take 1 capsule by mouth daily.    Marland Kitchen oxyCODONE-acetaminophen (PERCOCET/ROXICET) 5-325 MG tablet Take 2 tablets by mouth every 6 (six) hours as needed for moderate pain.  Vladimir Faster Glycol-Propyl Glycol (SYSTANE FREE OP) Place 1 drop into both eyes daily.  . predniSONE (DELTASONE) 5 MG tablet Take 5 mg by mouth daily.   . traMADol (ULTRAM) 50 MG tablet Take 50 mg by mouth 3 (three) times daily. Taking every day per patient   No facility-administered encounter medications on file as of 03/24/2016.      Patient Active Problem List   Diagnosis Date Noted  . Fibromyalgia 12/28/2015  . MAI (mycobacterium avium-intracellulare) infection ([place]) 12/28/2015  . Dehydration 12/28/2015  . Hyponatremia 12/28/2015  . CAP (community acquired pneumonia) 12/28/2015  . Ear fullness 06/11/2015  . Blurry vision 06/11/2015  . Nausea without vomiting 06/11/2015  . Diarrhea 06/11/2015  . 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 Skull lesion 03/29/2015  . Multiple allergies 03/29/2015  . Chest tightness 11/26/2010  . Pulmonary nodules/lesions, multiple 11/26/2010  . SPRAIN&STRAIN OF UNSPECIFIED SITE OF KNEE&LEG 06/14/2009  . Polymyositis ([place]) 12/26/2008  . 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 Essential hypertension 07/19/2008  . ALLERGIC RHINITIS 07/19/2008  . ADENOCARCINOMA, BREAST, HX OF 07/19/2008     Health Maintenance Due  Topic Date Due  . TETANUS/TDAP  03/16/1956  . ZOSTAVAX  03/16/1997  . DEXA SCAN  03/16/2002  . PNA vac Low Risk Adult (2 of 2 - PCV13) 01/17/2015  . INFLUENZA VACCINE  02/12/2016     Review of Systems  Physical Exam   BP (!) 149/88   Pulse 61   Temp 98.1 F (36.7 C) (Oral)   Wt 144 lb (65.3 kg)   BMI 27.21 kg/m  Physical Exam  Constitutional:  oriented to person, place, and time. appears well-developed and well-nourished. No distress.   HENT: [place]/AT, PERRLA, no scleral icterus Mouth/Throat: Oropharynx is clear and moist. No oropharyngeal exudate.  Cardiovascular: Normal rate, regular rhythm and normal heart sounds. Exam reveals no gallop and no friction rub.  No murmur heard.  Pulmonary/Chest: Effort normal and breath sounds normal. No respiratory distress.  has no wheezes.  Neck = supple, no nuchal rigidity Abdominal: Soft. Bowel sounds are normal.  exhibits no distension. There is no tenderness.  Lymphadenopathy: no cervical adenopathy. No axillary adenopathy Neurological: alert and oriented to person, place, and time.  Skin: Skin is warm and dry. No rash noted. No erythema.  Psychiatric: a normal mood and affect.  behavior is normal.   CBC Lab Results  Component Value Date   WBC 9.6 12/29/2015   RBC 3.31 (L) 12/29/2015   HGB 10.3 (L) 12/29/2015   HCT 33.2 (L) 12/29/2015   PLT 267 12/29/2015   MCV 100.3 (H) 12/29/2015   MCH 31.1 12/29/2015   MCHC 31.0 12/29/2015   RDW 13.7 12/29/2015   LYMPHSABS 1.4 12/29/2015   MONOABS 1.3 (H) 12/29/2015   EOSABS 0.3 12/29/2015   BASOSABS 0.0 12/29/2015   BMET Lab Results  Component Value Date   NA 131 (L) 12/29/2015   K 4.4 12/29/2015   CL 99 (L) 12/29/2015   CO2 24 12/29/2015   GLUCOSE 89 12/29/2015   BUN 8 12/29/2015   CREATININE 0.87 12/29/2015   CALCIUM 9.3 12/29/2015   GFRNONAA >60 12/29/2015   GFRAA >60 12/29/2015     Assessment and Plan Pulmonary mac = plan to defer treatment for now Will get PFT, establish with pulmonary (previously seen PW) Will get cxr today

## 2016-05-12 ENCOUNTER — Ambulatory Visit (HOSPITAL_COMMUNITY): Payer: Medicare Other

## 2016-05-12 ENCOUNTER — Other Ambulatory Visit (HOSPITAL_COMMUNITY): Payer: Self-pay | Admitting: Internal Medicine

## 2016-05-12 ENCOUNTER — Ambulatory Visit (HOSPITAL_COMMUNITY): Payer: Medicare Other | Admitting: Internal Medicine

## 2016-05-12 ENCOUNTER — Ambulatory Visit
Admit: 2016-05-12 | Discharge: 2016-05-12 | Disposition: A | Discharge status: Home / Self Care | Payer: Medicare Other | Source: Ambulatory Visit | Attending: Internal Medicine | Admitting: Internal Medicine

## 2016-05-12 DIAGNOSIS — Z1231 Encounter for screening mammogram for malignant neoplasm of breast: Secondary | ICD-10-CM

## 2016-05-12 DIAGNOSIS — E039 Hypothyroidism, unspecified: Secondary | ICD-10-CM | POA: Insufficient documentation

## 2016-05-12 DIAGNOSIS — Z1239 Encounter for other screening for malignant neoplasm of breast: Secondary | ICD-10-CM | POA: Insufficient documentation

## 2016-05-12 DIAGNOSIS — E785 Hyperlipidemia, unspecified: Secondary | ICD-10-CM

## 2016-05-12 DIAGNOSIS — Z79899 Other long term (current) drug therapy: Secondary | ICD-10-CM

## 2016-05-12 LAB — CBC WITH DIFF
BASOPHIL #: 0.1 x10ˆ3/uL (ref 0.00–0.20)
BASOPHIL %: 1 %
EOSINOPHIL #: 0.2 x10ˆ3/uL (ref 0.00–0.50)
EOSINOPHIL %: 3 %
EOSINOPHIL %: 3 %
HCT: 39.1 % (ref 34.6–46.2)
HGB: 13.3 g/dL (ref 11.8–15.8)
LYMPHOCYTE #: 2 x10ˆ3/uL (ref 0.90–3.40)
LYMPHOCYTE %: 36 %
MCH: 30 pg (ref 27.6–33.2)
MCHC: 34 g/dL (ref 32.6–35.4)
MCV: 88.3 fL (ref 82.3–96.7)
MONOCYTE #: 0.5 x10ˆ3/uL (ref 0.20–0.90)
MONOCYTE %: 9 %
MPV: 8.9 fL (ref 6.6–10.2)
NEUTROPHIL #: 2.9 x10ˆ3/uL (ref 1.50–6.40)
NEUTROPHIL %: 51 %
PLATELETS: 236 x10ˆ3/uL (ref 140–440)
RBC: 4.43 x10ˆ6/uL (ref 3.80–5.24)
RDW: 13.2 % (ref 12.4–15.2)
WBC: 5.7 x10ˆ3/uL (ref 3.5–10.3)

## 2016-05-12 LAB — COMPREHENSIVE METABOLIC PANEL, NON-FASTING
ALBUMIN: 4.2 g/dL (ref 3.5–4.8)
ALKALINE PHOSPHATASE: 50 U/L (ref 20–130)
ALT (SGPT): 11 U/L (ref 7–42)
ANION GAP: 6 mmol/L
AST (SGOT): 21 U/L (ref 12–35)
BILIRUBIN TOTAL: 1.1 mg/dL (ref 0.3–1.2)
BUN/CREA RATIO: 11
BUN: 9 mg/dL (ref 8–20)
CALCIUM: 9.3 mg/dL (ref 8.9–10.3)
CHLORIDE: 99 mmol/L — ABNORMAL LOW (ref 101–111)
CO2 TOTAL: 29 mmol/L (ref 22–32)
CREATININE: 0.8 mg/dL (ref 0.6–1.2)
ESTIMATED GFR: 69 mL/min/1.73mˆ2
GLUCOSE: 76 mg/dL (ref 70–110)
POTASSIUM: 3.8 mmol/L (ref 3.6–5.1)
PROTEIN TOTAL: 6.4 g/dL (ref 6.4–8.3)
SODIUM: 134 mmol/L — ABNORMAL LOW (ref 136–144)

## 2016-05-12 LAB — URINALYSIS, MACROSCOPIC
NITRITE: NEGATIVE
PH: 6 (ref 5.0–7.0)

## 2016-05-12 LAB — URINALYSIS, MICROSCOPIC

## 2016-05-12 LAB — LIPID PANEL
CHOLESTEROL: 194 mg/dL (ref 0–199)
CHOLESTEROL: 194 mg/dL (ref 0–199)
HDL CHOL: 68 mg/dL (ref 38–85)
LDL DIRECT: 99 mg/dL (ref 0–99)
LDL DIRECT: 99 mg/dL (ref 0–99)
TRIGLYCERIDES: 90 mg/dL (ref 0–199)
VLDL CALC: 18 mg/dL (ref 0–50)

## 2016-05-12 LAB — CREATINE KINASE (CK), TOTAL, SERUM: CREATINE KINASE: 59 U/L (ref 38–234)

## 2016-05-14 ENCOUNTER — Ambulatory Visit (HOSPITAL_COMMUNITY): Payer: Self-pay

## 2016-05-28 ENCOUNTER — Ambulatory Visit: Payer: Medicare Other | Attending: Internal Medicine | Admitting: Internal Medicine

## 2016-05-28 ENCOUNTER — Ambulatory Visit (HOSPITAL_COMMUNITY): Payer: Medicare Other

## 2016-05-28 DIAGNOSIS — N390 Urinary tract infection, site not specified: Principal | ICD-10-CM | POA: Insufficient documentation

## 2016-05-31 LAB — URINE CULTURE: URINE CULTURE: NO GROWTH

## 2016-06-24 ENCOUNTER — Ambulatory Visit: Payer: Medicare Other | Admitting: Internal Medicine

## 2016-10-20 ENCOUNTER — Ambulatory Visit (HOSPITAL_COMMUNITY): Payer: Self-pay

## 2016-11-18 ENCOUNTER — Encounter (HOSPITAL_BASED_OUTPATIENT_CLINIC_OR_DEPARTMENT_OTHER): Payer: Medicare Other | Admitting: Internal Medicine

## 2016-12-03 ENCOUNTER — Encounter (HOSPITAL_BASED_OUTPATIENT_CLINIC_OR_DEPARTMENT_OTHER): Payer: Self-pay | Admitting: Internal Medicine

## 2016-12-03 DIAGNOSIS — E785 Hyperlipidemia, unspecified: Secondary | ICD-10-CM

## 2016-12-16 ENCOUNTER — Ambulatory Visit (HOSPITAL_COMMUNITY): Payer: Self-pay

## 2016-12-17 ENCOUNTER — Encounter (HOSPITAL_BASED_OUTPATIENT_CLINIC_OR_DEPARTMENT_OTHER): Payer: Medicare Other | Admitting: Internal Medicine

## 2016-12-17 DIAGNOSIS — Z9221 Personal history of antineoplastic chemotherapy: Secondary | ICD-10-CM | POA: Diagnosis not present

## 2016-12-17 DIAGNOSIS — Z853 Personal history of malignant neoplasm of breast: Secondary | ICD-10-CM | POA: Diagnosis not present

## 2016-12-17 DIAGNOSIS — Z1509 Genetic susceptibility to other malignant neoplasm: Secondary | ICD-10-CM | POA: Diagnosis not present

## 2016-12-17 DIAGNOSIS — M8580 Other specified disorders of bone density and structure, unspecified site: Secondary | ICD-10-CM | POA: Diagnosis not present

## 2016-12-17 DIAGNOSIS — Z923 Personal history of irradiation: Secondary | ICD-10-CM | POA: Diagnosis not present

## 2016-12-17 DIAGNOSIS — N289 Disorder of kidney and ureter, unspecified: Secondary | ICD-10-CM | POA: Diagnosis not present

## 2016-12-17 DIAGNOSIS — R918 Other nonspecific abnormal finding of lung field: Secondary | ICD-10-CM | POA: Diagnosis not present

## 2016-12-18 ENCOUNTER — Other Ambulatory Visit: Payer: Self-pay | Admitting: Urology

## 2016-12-18 DIAGNOSIS — N2889 Other specified disorders of kidney and ureter: Secondary | ICD-10-CM

## 2016-12-23 ENCOUNTER — Ambulatory Visit: Payer: Medicare Other | Attending: Internal Medicine | Admitting: Internal Medicine

## 2016-12-23 ENCOUNTER — Encounter (HOSPITAL_BASED_OUTPATIENT_CLINIC_OR_DEPARTMENT_OTHER): Payer: Self-pay | Admitting: Internal Medicine

## 2016-12-23 VITALS — BP 128/70 | HR 73 | Ht 62.0 in | Wt 88.0 lb

## 2016-12-23 DIAGNOSIS — E782 Mixed hyperlipidemia: Secondary | ICD-10-CM | POA: Insufficient documentation

## 2016-12-23 DIAGNOSIS — E039 Hypothyroidism, unspecified: Principal | ICD-10-CM | POA: Insufficient documentation

## 2016-12-23 DIAGNOSIS — E559 Vitamin D deficiency, unspecified: Secondary | ICD-10-CM | POA: Insufficient documentation

## 2016-12-23 DIAGNOSIS — Z79899 Other long term (current) drug therapy: Secondary | ICD-10-CM | POA: Insufficient documentation

## 2016-12-23 LAB — URINALYSIS, MACROSCOPIC
BILIRUBIN: NEGATIVE mg/dL
BLOOD: NEGATIVE mg/dL
NITRITE: NEGATIVE
PROTEIN: NEGATIVE mg/dL
SPECIFIC GRAVITY: 1.015 (ref 1.010–1.025)
UROBILINOGEN: 1 mg/dL

## 2016-12-23 LAB — COMPREHENSIVE METABOLIC PNL, FASTING
ALBUMIN: 4.5 g/dL (ref 3.2–4.6)
ALKALINE PHOSPHATASE: 52 U/L (ref 20–130)
ALT (SGPT): 7 U/L (ref <=52)
AST (SGOT): 15 U/L (ref <=35)
BILIRUBIN TOTAL: 1 mg/dL (ref 0.3–1.2)
BUN/CREA RATIO: 17
CALCIUM: 9.4 mg/dL (ref 8.2–10.2)
CO2 TOTAL: 30 mmol/L (ref 21–35)
CREATININE: 0.76 mg/dL (ref <=1.30)
ESTIMATED GFR: >60 mL/min/{1.73_m2}
GLUCOSE: 90 mg/dL (ref 70–110)
POTASSIUM: 4.2 mmol/L (ref 3.5–5.0)
SODIUM: 139 mmol/L (ref 135–145)

## 2016-12-23 LAB — CBC
HCT: 38.7 % (ref 34.6–46.2)
HGB: 13.6 g/dL (ref 11.8–15.8)
MCH: 30.8 pg (ref 27.6–33.2)
MCHC: 35.1 g/dL (ref 32.6–35.4)
MCV: 87.7 fL (ref 82.3–96.7)
MPV: 9 fL (ref 6.6–10.2)
PLATELETS: 212 x10ˆ3/uL (ref 140–440)
RBC: 4.41 x10ˆ6/uL (ref 3.80–5.24)
RDW: 13.5 % (ref 12.4–15.2)
WBC: 5.7 x10?3/uL (ref 3.5–10.3)

## 2016-12-23 LAB — URINALYSIS, MICROSCOPIC

## 2016-12-23 LAB — THYROID STIMULATING HORMONE (SENSITIVE TSH): TSH: 0.088 u[IU]/mL — ABNORMAL LOW (ref 0.450–5.330)

## 2016-12-23 MED ORDER — LEVOTHYROXINE 75 MCG TABLET
75.0000 ug | ORAL_TABLET | Freq: Every morning | ORAL | 3 refills | Status: DC
Start: 2016-12-23 — End: 2016-12-24

## 2016-12-23 MED ORDER — PRAVASTATIN 10 MG TABLET
10.00 mg | ORAL_TABLET | Freq: Every evening | ORAL | 4 refills | Status: DC
Start: 2016-12-23 — End: 2019-11-22

## 2016-12-23 NOTE — Progress Notes (Signed)
[facility] - Internal Medicine   [address] [place] [address] [address]  Dept: [phone]  Dept Fax: [phone]    [name]  80-Apr-1936  [id number]    Date of Service: 12/23/2016 10:00 AM EDT    Chief complaint:   Chief Complaint   Patient presents with   . Follow Up 6 Months   . Medication Question     wants to change from lipitor due to causing her to hurt       HPI:     This is a case of a 82 y.o. year old female who comes in today for six-month follow-up.  Taking medications on a regular basis.  Had questions of starting Pravachol in place of Lipitor.  Compliant with cholesterol restriction and diet.  Weight somewhat of concern.  Patient very small frame.  Encouraged increase caloric intake with healthy foods.  No signs or symptoms of higher low thyroid    ROS:    CARDIAC:  No chest pain, DOE, or palpitations. No orthopnea or PND.    PULMONARY:  No cough, sputum, or hemoptysis. No fever,chills, or night sweats.    GI:  No nausea or vomiting. No abdominal pain. No change in bowel habits          No melena or bright red rectal bleeding.    GU :  Voiding without difficulty     NEURO: No headache , diplopia or loss of function of limbs.    Current Outpatient Prescriptions   Medication Sig   . atorvastatin (LIPITOR) 10 mg Oral Tablet    . levothyroxine (SYNTHROID) 75 mcg Oral Tablet Take 1 Tab (75 mcg total) by mouth Every morning for 90 days   . pravastatin (PRAVACHOL) 10 mg Oral Tablet Take 1 Tab (10 mg total) by mouth Every evening   . [DISCONTINUED] levothyroxine (SYNTHROID) 75 mcg Oral Tablet        Objective:     BP 128/70  Pulse 73  Ht 1.575 m (5\' 2" )  Wt (!) 39.9 kg (88 lb)  LMP  (LMP Unknown)  SpO2 95%  Breastfeeding? No  BMI 16.1 kg/m2  General appearance: alert, oriented x 3, in her normal state, cooperative, not in apparent distress, appearing stated age   Neck: No lymphadenopathy or thyroid anomalies noted, carotids 2 + bilaterally without bruit  Lungs: clear to  auscultation bilaterally. No crackles or wheeze noted. Normal respiratory effort  Heart: No JVD. Regular rate and rhythm, S1, S2 normal.  No gallop or rub. No murmur  Abdomen: soft, non-tender. Bowel sounds normal. No hepatosplenomegaly.  Extremities: no cyanosis or edema.  Pulses intact.  No calf pain    Assessment/Plan     ENCOUNTER DIAGNOSES     ICD-10-CM   1. Acquired hypothyroidism.   Check TFTs and adjust Synthroid if needed E03.9   2. Mixed hyperlipidemia.   Check lipid panel.   Trial of low-dose Pravachol E78.2   3. High risk medication use.   Check CBC liver function renal function Z79.899   4. Vitamin D deficiency.   Check vitamin-D E55.9       BMI addressed: Advised on importance of nutrition to increase below normal BMI.             Orders Placed This Encounter   . CBC   . COMPREHENSIVE METABOLIC PNL, FASTING   . URINALYSIS, MACROSCOPIC AND MICROSCOPIC   . THYROID STIMULATING HORMONE (SENSITIVE TSH)   . LIPID  PANEL   . VITAMIN D   . levothyroxine (SYNTHROID) 75 mcg Oral Tablet   . pravastatin (PRAVACHOL) 10 mg Oral Tablet         The patient was given ample opportunity to ask questions and those questions were answered to the patient's satisfaction. The patient was encouraged to be involved in their own care, and all diagnoses, medications, and medication side-effects were discussed.  A copy of the patient's medication list was printed and given to the patient. A good faith effort was made to reconcile the patient's medications.  The patient was told to contact me with any additional questions or concerns, or go to the ED in an emergency.     Follow up:   Six-month    This note was partially generated using MModal Fluency Direct system, and there may be some incorrect words, spellings, and punctuation that were not noted in checking the note before saving.    Harrie ForemanMichael Dezire Turk, M.D.    [facility] - [facility]   [address] [place] [address]  [place] [id number]  Dept: [phone]   Dept Fax: [phone]

## 2016-12-24 ENCOUNTER — Other Ambulatory Visit (HOSPITAL_BASED_OUTPATIENT_CLINIC_OR_DEPARTMENT_OTHER): Payer: Self-pay | Admitting: Internal Medicine

## 2016-12-24 MED ORDER — LEVOTHYROXINE 75 MCG TABLET
75.0000 ug | ORAL_TABLET | Freq: Every morning | ORAL | 3 refills | Status: DC
Start: 2016-12-24 — End: 2017-01-21

## 2016-12-24 NOTE — Progress Notes (Signed)
Notified Patient   . Ars

## 2016-12-26 LAB — LIPID PANEL
CHOLESTEROL: 189 mg/dL (ref 0–199)
HDL CHOL: 61 mg/dL (ref >40)
LDL DIRECT: 110 mg/dL — ABNORMAL HIGH (ref 0–99)
TRIGLYCERIDES: 68 mg/dL (ref 0–199)
VLDL CALC: 14 mg/dL (ref 0–50)
VLDL CALC: 14 mg/dL (ref 0–50)

## 2016-12-30 ENCOUNTER — Ambulatory Visit
Admission: RE | Admit: 2016-12-30 | Discharge: 2016-12-30 | Disposition: A | Discharge status: Home / Self Care | Payer: Medicare Other | Source: Ambulatory Visit | Attending: Urology | Admitting: Urology

## 2016-12-30 DIAGNOSIS — N2889 Other specified disorders of kidney and ureter: Secondary | ICD-10-CM

## 2016-12-30 HISTORY — PX: IR RADIOLOGIST EVAL & MGMT: IMG5224

## 2016-12-30 NOTE — Consult Note (Signed)
Chief Complaint: Patient was seen in consultation today for  Chief Complaint  Patient presents with  . Advice Only    Consult for Cryoablation of Right Renal Mass     at the request of Chao,Roberto  Referring Physician(s): Chao,Roberto  History of Present Illness: MAILA DUKES is a 82 y.o. 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 On routine surveillance imaging she was noted to have slight interval enlargement of a minimally enhancing lesion within the right kidney between May 2017 and June 2018. 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  She was referred to urology, Dr. Joie Bimler, who evaluated her and answered many of her questions. Due to her age, and medical comorbidities, she is a suboptimal candidate for surgical resection. Her options include continued observation, biopsy, and percutaneous ablation. She presents today at the kind request of Dr. Nila Nephew to discuss these options.  Currently, Mrs. Doscher is in her usual state of health. Her current symptoms include urinary urgency and mild dysuria. She was recently diagnosed with a gram-negative rod urinary tract infection and is currently on antibiotics.  She denies flank pain or hematuria. On the review of systems, she also notes that she has intermittent syncope. She has undergone a complete cardiac evaluation which was negative. She is getting an appointment to see a neurologist next for further workup.  Past Medical History:  Diagnosis Date  . 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 Hinton Rao.  s/p partial mastectomy, left breast xrt, chemo  . Allergic rhinitis   . Arthritis    "joints; right hip" (12/28/2015"  . Blurry vision 06/11/2015  . Chronic bronchitis ([place])   . Diarrhea 06/11/2015    . Ear fullness 06/11/2015  . GERD (gastroesophageal reflux disease)   . Hypertension   . Hypothyroidism   . Multiple allergies 03/29/2015  . 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 Nausea without vomiting 06/11/2015  . Pneumonia "several times"  . Polymyositis ([place])    Dr. Justine Null  . Pulmonary nodule   . Skull lesion 03/29/2015    Past Surgical History:  Procedure Laterality Date  . APPENDECTOMY    . BREAST BIOPSY Left   . CATARACT EXTRACTION W/ INTRAOCULAR LENS  IMPLANT, BILATERAL Bilateral   . DILATION AND CURETTAGE OF UTERUS    . LAPAROSCOPIC CHOLECYSTECTOMY    . MASTECTOMY, PARTIAL Left   . NASAL SINUS SURGERY    . POSTERIOR LUMBAR FUSION  10/2015   "have screws and mesh"  . TUBAL LIGATION    . VAGINAL HYSTERECTOMY      Allergies: Augmentin [amoxicillin-pot clavulanate]; Avelox [moxifloxacin hcl in nacl]; Clarithromycin; Codeine; Cymbalta [duloxetine hcl]; Metronidazole and related; Moxifloxacin; Amoxicillin-pot clavulanate; and Clarithromycin  Medications: Prior to Admission medications   Medication Sig Start Date End Date Taking? Authorizing Provider  acetaminophen (TYLENOL) 325 MG tablet Take 650 mg by mouth every 6 (six) hours as needed for mild pain.    Yes [provider]  calcium carbonate (TUMS - DOSED IN MG ELEMENTAL CALCIUM) 500 MG chewable tablet Chew 1 tablet by mouth daily as needed for indigestion or heartburn.   Yes [provider]  carbamide peroxide (DEBROX) 6.5 % otic solution Place 5 drops into both ears 2 (two) times daily. 06/11/15  Yes Tommy Medal, Lavell Islam, MD  carvedilol (COREG) 12.5 MG tablet Take 12.5 mg by mouth 2 (two) times daily. 04/12/14  Yes [provider]  Cholecalciferol (VITAMIN D3) 50000 UNITS CAPS Take 1 capsule by mouth once a week. No specific day   Yes [provider]  famotidine-calcium carbonate-magnesium hydroxide (PEPCID COMPLETE) 10-800-165 MG chewable tablet Chew 1 tablet by mouth daily.     Yes [provider]  gabapentin (NEURONTIN) 400 MG capsule Take 400 mg by mouth 3 (three) times daily.  08/19/10  Yes [provider]  guaiFENesin (MUCINEX) 600 MG 12 hr tablet Take 1 tablet (600 mg total) by mouth 2 (two) times daily. 12/30/15  Yes Dhungel, Nishant, MD  guaiFENesin-dextromethorphan (ROBITUSSIN DM) 100-10 MG/5ML syrup Take 5 mLs by mouth every 4 (four) hours as needed for cough. 12/30/15  Yes Dhungel, Nishant, MD  levothyroxine (SYNTHROID, LEVOTHROID) 50 MCG tablet Take 1.5 tablets (75 mcg total) by mouth daily. 12/30/15  Yes Dhungel, Nishant, MD  Multiple Vitamin (MULTIVITAMIN) capsule Take 1 capsule by mouth daily.     Yes [provider]  predniSONE (DELTASONE) 5 MG tablet Take 5 mg by mouth daily.    Yes [provider]  traMADol (ULTRAM) 50 MG tablet Take 50 mg by mouth 3 (three) times daily. Taking every day per patient 08/19/10  Yes [provider]  mirtazapine (REMERON) 30 MG tablet Take 15 mg by mouth at bedtime.    [provider]  Misc. Devices (ACAPELLA) MISC 1 application by Does not apply route 3 (three) times daily. Patient not taking: Reported on 12/30/2016 12/30/15   Dhungel, Flonnie Overman, MD  oxyCODONE-acetaminophen (PERCOCET/ROXICET) 5-325 MG tablet Take 2 tablets by mouth every 6 (six) hours as needed for moderate pain. Patient not taking: Reported on 12/30/2016 12/30/15   Dhungel, Flonnie Overman, MD  Polyethyl Glycol-Propyl Glycol (SYSTANE FREE OP) Place 1 drop into both eyes daily.    [provider]     Family History  Problem Relation Age of Onset  . Allergies Mother   . Allergies Other        children  . Rheum arthritis Mother   . Lymphoma Sister     Social History   Social History  . Marital status: Widowed    Spouse name: N/A  . Number of children: Y  . Years of education: N/A   Occupational History  . retired from office work    Social History Main Topics  . Smoking status: Never Smoker  .  Smokeless tobacco: Never Used  . Alcohol use No  . Drug use: No  . Sexual activity: Not on file   Other Topics Concern  . Not on file   Social History Narrative  . No narrative on file    ECOG Status: 0 - Asymptomatic  Review of Systems: A 12 point ROS discussed and pertinent positives are indicated in the HPI above.  All other systems are negative.  Review of Systems  Vital Signs: BP 121/66   Pulse (!) 57   Temp 98 F (36.7 C) (Oral)   Resp 14   Ht 5' 1.5" (1.562 m)   Wt 138 lb (62.6 kg)   SpO2 97%   BMI 25.65 kg/m   Physical Exam  Constitutional: She is oriented to person, place, and time. She appears well-developed and well-nourished. No distress.  HENT:  Head: Normocephalic and atraumatic.  Eyes: Left eye exhibits no discharge. No scleral icterus.  Cardiovascular: Normal rate and regular rhythm.   Pulmonary/Chest: Effort normal and breath  sounds normal.  Abdominal: Soft. She exhibits no distension.  Neurological: She is alert and oriented to person, place, and time.  Skin: Skin is warm and dry.  Psychiatric: She has a normal mood and affect. Her behavior is normal.  Nursing note and vitals reviewed.   Imaging: No results found.  Labs:  CBC: No results for input(s): WBC, HGB, HCT, PLT in the last 8760 hours.  COAGS: No results for input(s): INR, APTT in the last 8760 hours.  BMP: No results for input(s): NA, K, CL, CO2, GLUCOSE, BUN, CALCIUM, CREATININE, GFRNONAA, GFRAA in the last 8760 hours.  Invalid input(s): CMP  LIVER FUNCTION TESTS: No results for input(s): BILITOT, AST, ALT, ALKPHOS, PROT, ALBUMIN in the last 8760 hours.  TUMOR MARKERS: No results for input(s): AFPTM, CEA, CA199, CHROMGRNA in the last 8760 hours.  Assessment and Plan:  Very pleasant 82 year old female with an enlarging solid lesion in the interpolar right kidney with low-grade enhancement. 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 When first imaged on CT scan dated 10/11/2013, the lesion measured approximately 1.5 x 1.2 cm. In April 2016, the lesion measured 1.8 x 1.2 cm. Currently, the lesion measures 2.5 x 2.2 cm.  We discussed the natural history of renal cell neoplasms including both benign, and malignant variants and the difference between low-grade an intermediate and high-grade neoplasms. Her lesion is very likely a low grade malignancy given the imaging characteristics. However, at her age it is unclear whether this malignancy would represent a threat to her overall survival. It would be reasonable to continue observation, and it would also be reasonable to proceed with treatment. I think an intermediate step would be CT-guided biopsy to confirm the pathologic diagnosis and to establish the grade of the tumor if possible.  She is also concerned about her intermittent syncope and wants to get her appointment with a neurologist and further evaluate this issue. I suggested that we set up an initial follow-up MRI for 6 months from now, and I encouraged her that as soon as she is up for it, we should proceed with a biopsy. If the biopsy results come back positive for malignancy, we should consider percutaneous cryoablation at that time. She is currently an excellent candidate for cryoablation, however if the lesion continues to enlarge it will become increasingly difficult to treat.  1.) Repeat MRI of the abdomen in 6 months with follow-up clinic visit and labs (CBC and basic metabolic panel).   2.) I also encouraged Mrs. Carrero to contact us when she is ready to proceed with biopsy of her right renal lesion. This biopsy could be performed at [facility] in [place] and should be scheduled with me.   Thank you for this interesting consult.  I greatly enjoyed meeting REETA KUK and look forward to participating in their care.  A copy of this report was sent to the requesting  provider on this date.  Electronically Signed: Jacqulynn Cadet 12/30/2016, 11:45 AM   I spent a total of  40 Minutes  in face to face in clinical consultation, greater than 50% of which was counseling/coordinating care for right renal neoplasm.

## 2017-01-21 ENCOUNTER — Other Ambulatory Visit (HOSPITAL_BASED_OUTPATIENT_CLINIC_OR_DEPARTMENT_OTHER): Payer: Self-pay

## 2017-01-21 MED ORDER — LEVOTHYROXINE 75 MCG TABLET
75.0000 ug | ORAL_TABLET | Freq: Every morning | ORAL | 3 refills | Status: DC
Start: 2017-01-21 — End: 2017-12-22

## 2017-02-24 ENCOUNTER — Ambulatory Visit: Payer: Medicare Other | Admitting: Neurology

## 2017-03-12 DIAGNOSIS — I10 Essential (primary) hypertension: Secondary | ICD-10-CM

## 2017-03-12 DIAGNOSIS — E785 Hyperlipidemia, unspecified: Secondary | ICD-10-CM | POA: Diagnosis not present

## 2017-03-12 DIAGNOSIS — R112 Nausea with vomiting, unspecified: Secondary | ICD-10-CM | POA: Diagnosis not present

## 2017-03-12 DIAGNOSIS — N2889 Other specified disorders of kidney and ureter: Secondary | ICD-10-CM | POA: Diagnosis not present

## 2017-03-12 DIAGNOSIS — C50919 Malignant neoplasm of unspecified site of unspecified female breast: Secondary | ICD-10-CM | POA: Diagnosis not present

## 2017-03-12 DIAGNOSIS — K529 Noninfective gastroenteritis and colitis, unspecified: Secondary | ICD-10-CM | POA: Diagnosis not present

## 2017-03-12 DIAGNOSIS — E039 Hypothyroidism, unspecified: Secondary | ICD-10-CM | POA: Diagnosis not present

## 2017-03-13 DIAGNOSIS — C50919 Malignant neoplasm of unspecified site of unspecified female breast: Secondary | ICD-10-CM | POA: Diagnosis not present

## 2017-03-13 DIAGNOSIS — E039 Hypothyroidism, unspecified: Secondary | ICD-10-CM | POA: Diagnosis not present

## 2017-03-13 DIAGNOSIS — K529 Noninfective gastroenteritis and colitis, unspecified: Secondary | ICD-10-CM | POA: Diagnosis not present

## 2017-03-13 DIAGNOSIS — N2889 Other specified disorders of kidney and ureter: Secondary | ICD-10-CM | POA: Diagnosis not present

## 2017-03-13 DIAGNOSIS — E785 Hyperlipidemia, unspecified: Secondary | ICD-10-CM | POA: Diagnosis not present

## 2017-03-13 DIAGNOSIS — I10 Essential (primary) hypertension: Secondary | ICD-10-CM | POA: Diagnosis not present

## 2017-03-13 DIAGNOSIS — R112 Nausea with vomiting, unspecified: Secondary | ICD-10-CM | POA: Diagnosis not present

## 2017-03-14 DIAGNOSIS — I10 Essential (primary) hypertension: Secondary | ICD-10-CM | POA: Diagnosis not present

## 2017-03-14 DIAGNOSIS — N2889 Other specified disorders of kidney and ureter: Secondary | ICD-10-CM | POA: Diagnosis not present

## 2017-03-14 DIAGNOSIS — C50919 Malignant neoplasm of unspecified site of unspecified female breast: Secondary | ICD-10-CM | POA: Diagnosis not present

## 2017-03-14 DIAGNOSIS — R112 Nausea with vomiting, unspecified: Secondary | ICD-10-CM | POA: Diagnosis not present

## 2017-03-15 DIAGNOSIS — I10 Essential (primary) hypertension: Secondary | ICD-10-CM | POA: Diagnosis not present

## 2017-03-15 DIAGNOSIS — N2889 Other specified disorders of kidney and ureter: Secondary | ICD-10-CM | POA: Diagnosis not present

## 2017-03-15 DIAGNOSIS — R112 Nausea with vomiting, unspecified: Secondary | ICD-10-CM | POA: Diagnosis not present

## 2017-03-15 DIAGNOSIS — C50919 Malignant neoplasm of unspecified site of unspecified female breast: Secondary | ICD-10-CM | POA: Diagnosis not present

## 2017-03-16 DIAGNOSIS — C50919 Malignant neoplasm of unspecified site of unspecified female breast: Secondary | ICD-10-CM | POA: Diagnosis not present

## 2017-03-16 DIAGNOSIS — N2889 Other specified disorders of kidney and ureter: Secondary | ICD-10-CM | POA: Diagnosis not present

## 2017-03-16 DIAGNOSIS — R112 Nausea with vomiting, unspecified: Secondary | ICD-10-CM | POA: Diagnosis not present

## 2017-03-16 DIAGNOSIS — I10 Essential (primary) hypertension: Secondary | ICD-10-CM | POA: Diagnosis not present

## 2017-05-21 ENCOUNTER — Encounter: Payer: Self-pay | Admitting: Interventional Radiology

## 2017-05-25 ENCOUNTER — Ambulatory Visit: Payer: Medicare Other | Admitting: Internal Medicine

## 2017-05-25 ENCOUNTER — Other Ambulatory Visit: Payer: Self-pay | Admitting: *Deleted

## 2017-05-25 DIAGNOSIS — N2889 Other specified disorders of kidney and ureter: Secondary | ICD-10-CM

## 2017-05-26 ENCOUNTER — Other Ambulatory Visit: Payer: Self-pay | Admitting: *Deleted

## 2017-05-26 ENCOUNTER — Other Ambulatory Visit: Payer: Self-pay | Admitting: Interventional Radiology

## 2017-05-26 DIAGNOSIS — N2889 Other specified disorders of kidney and ureter: Secondary | ICD-10-CM

## 2017-06-18 IMAGING — CR DG CHEST 2V
2 series · 2 of 2 positions shown · non-contrast
Comparison: 12/29/2015 chest x-ray and 12/28/2015 chest CT.

CLINICAL DATA: 81-year-old female with KEUNWHA infection. History of
breast cancer. Shortness breath cough. Subsequent encounter.

EXAM:
CHEST  2 VIEW

[w chest pa]
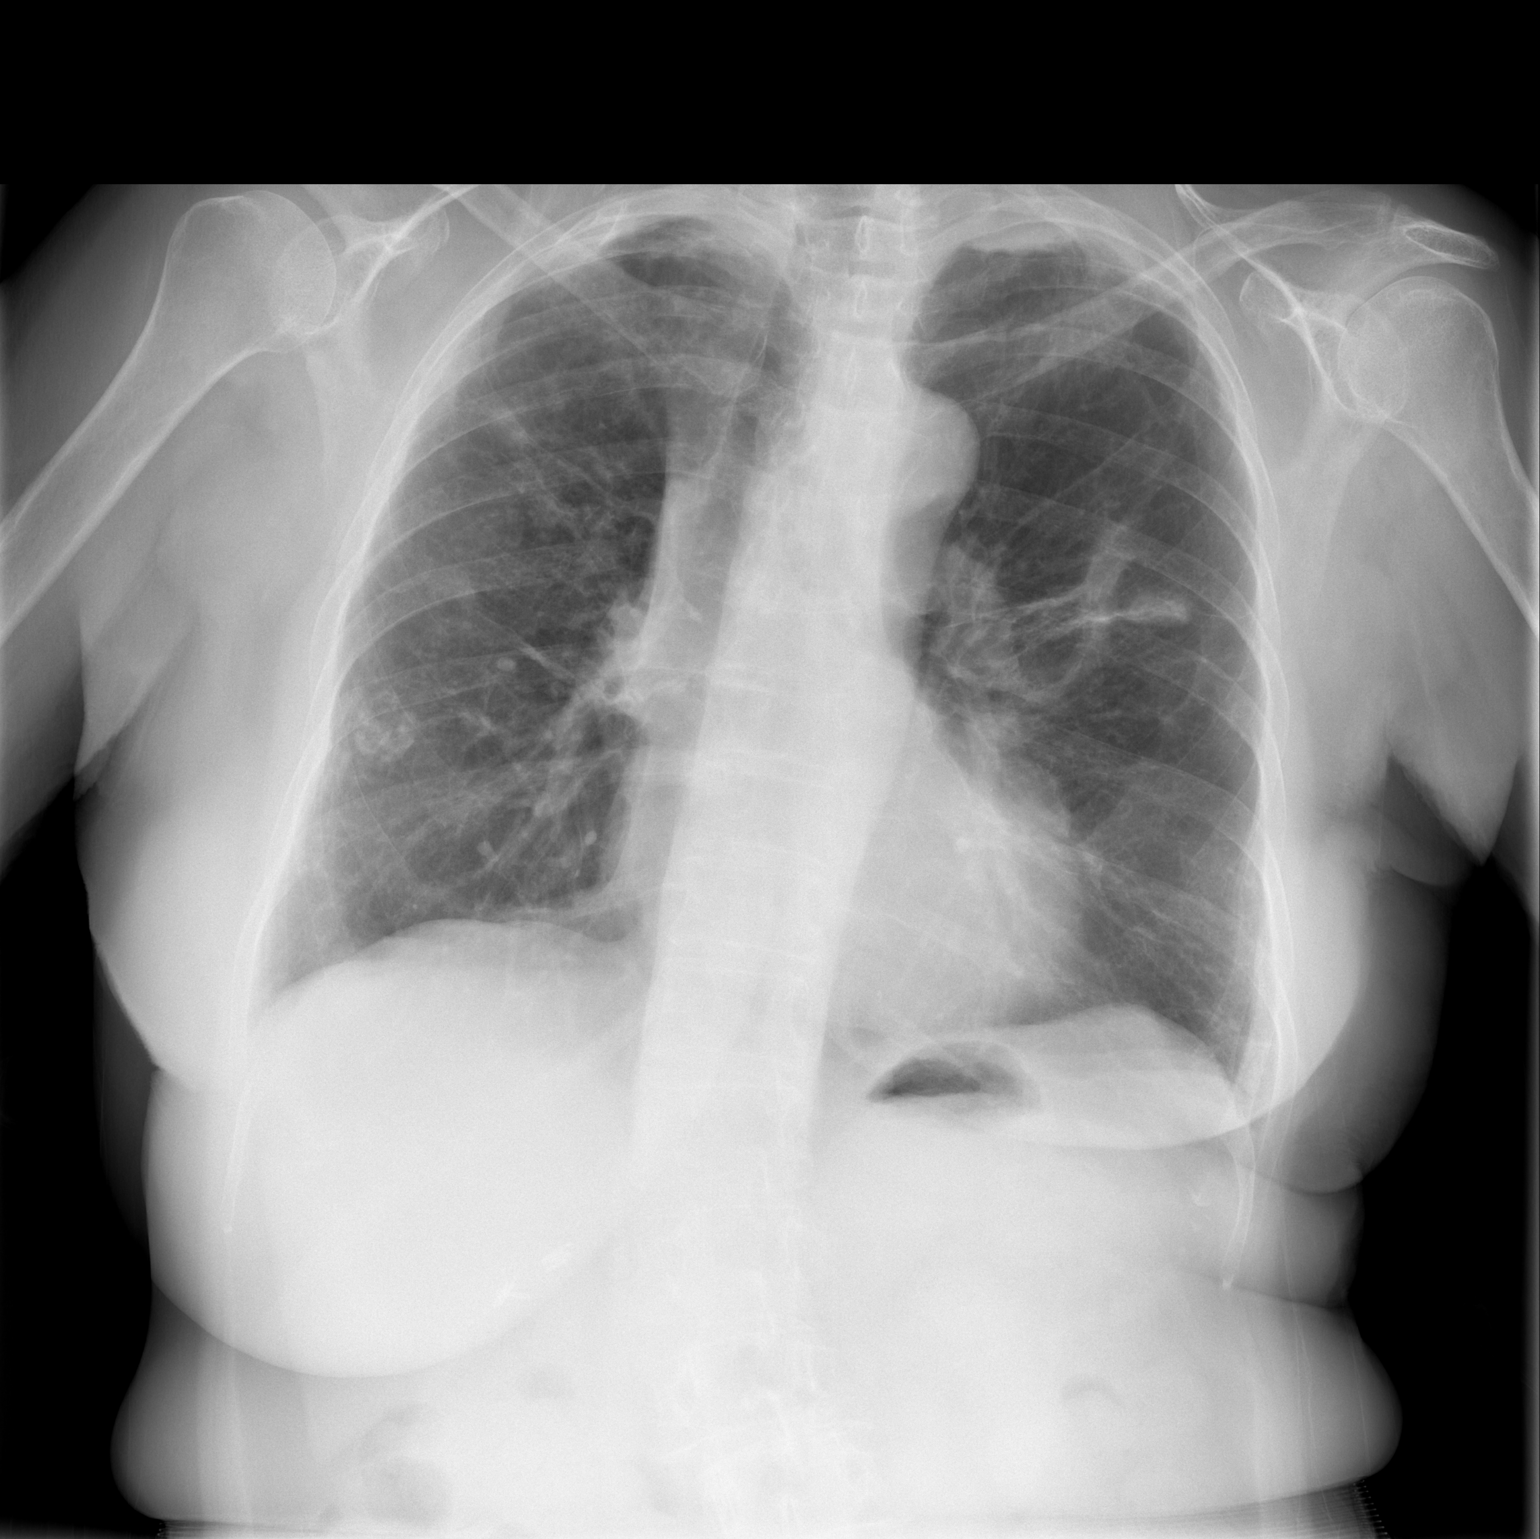

[w chest lat]
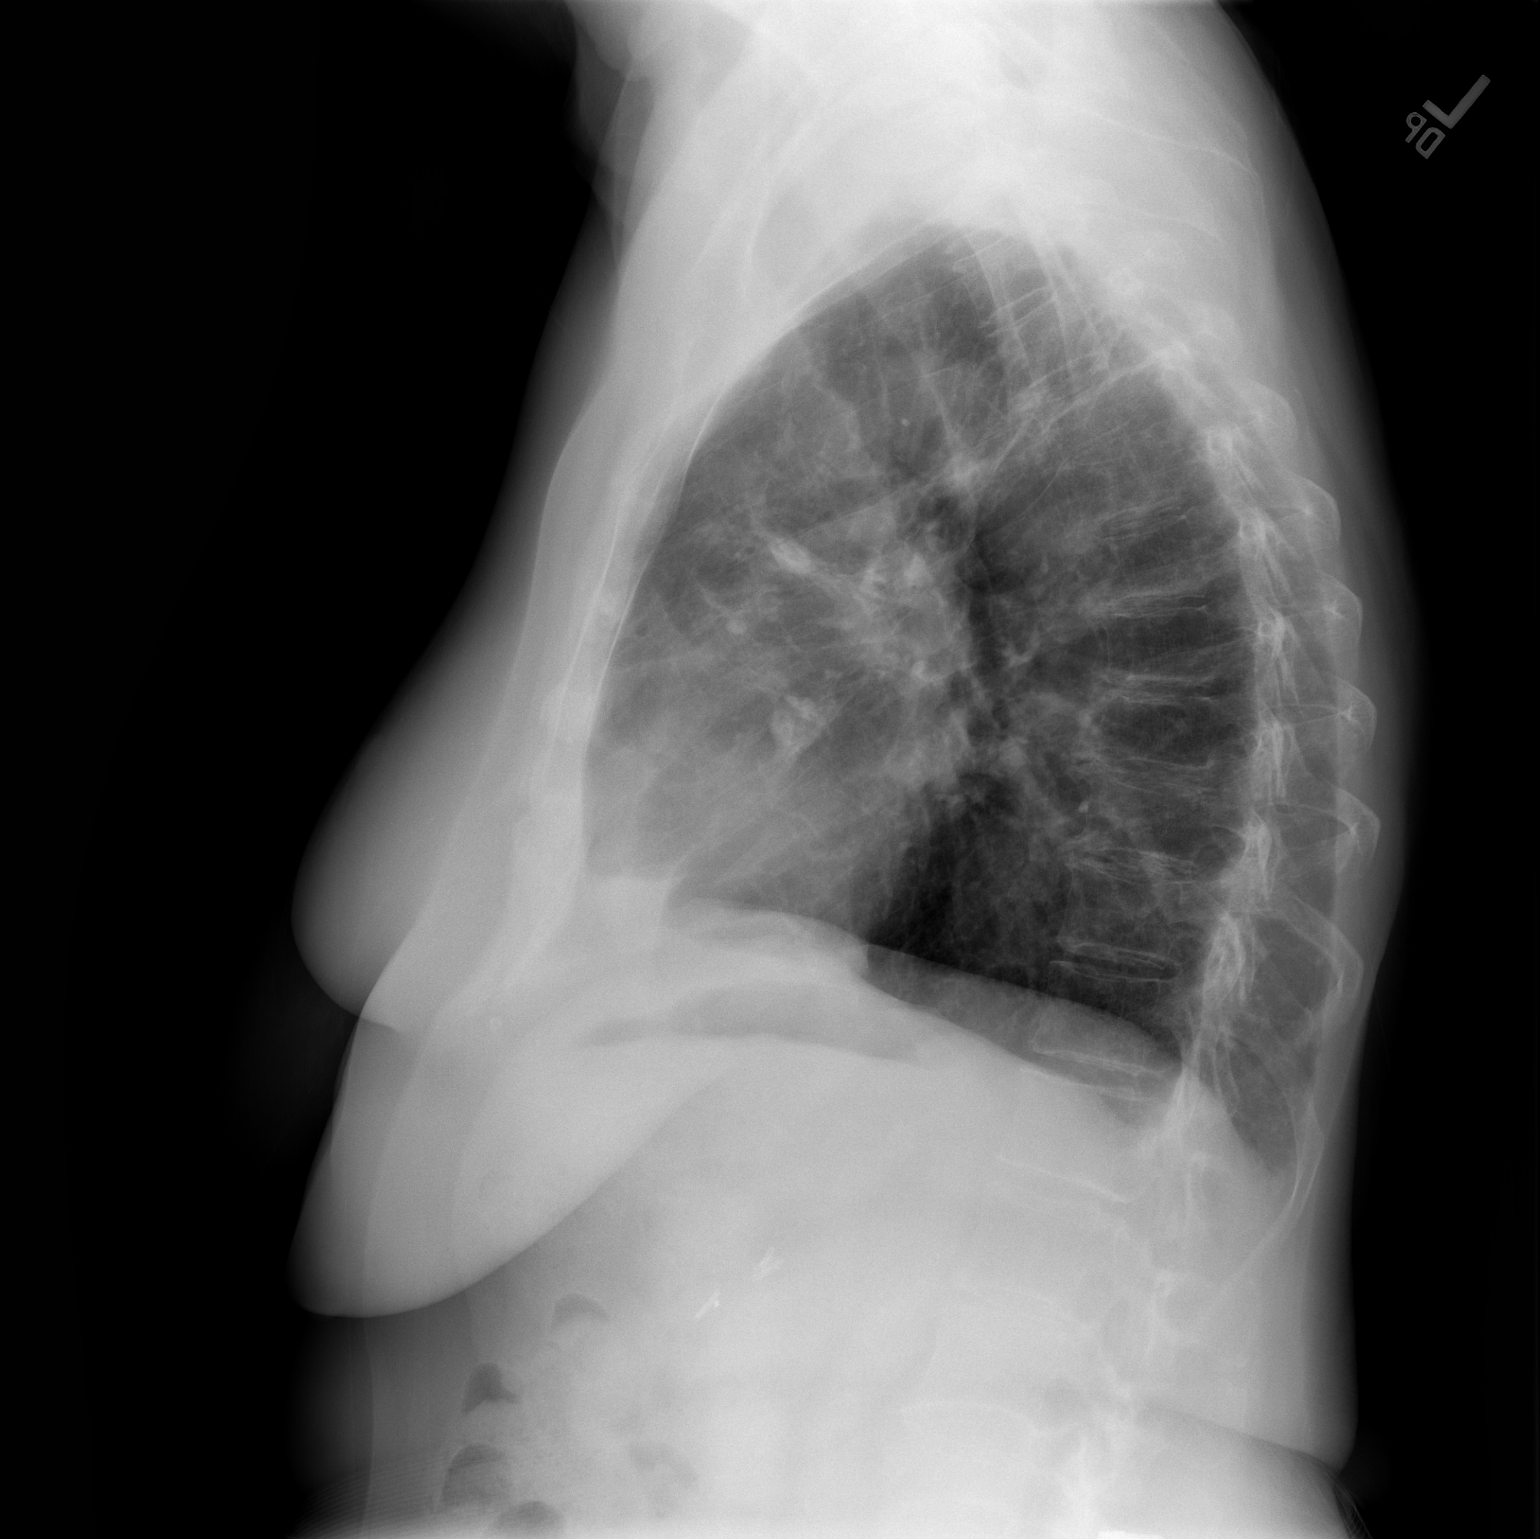

[2 of 2 positions shown; findings below may reference images not displayed]

FINDINGS: Slight increase in parenchymal changes left lung apex and peripheral
aspect right mid lung (see arrows). Slight improvement in degree of
aeration right lung base.

Remainder of pulmonary parenchyma changes otherwise appear similar
to prior exam.

Biapical pleural thickening without associated bony destruction.

Scoliosis thoracic -lumbar spine.

Tortuous aorta.

Heart size within normal limits.

Central pulmonary vascular prominence without pulmonary edema.

No pneumothorax.
IMPRESSION: Slight fluctuation of parenchymal changes as detailed above may
represent result of KEUNWHA.

Given the lung parenchymal changes, evaluating for possible
pulmonary metastatic disease is limited.

## 2017-06-24 ENCOUNTER — Ambulatory Visit: Payer: Medicare Other | Admitting: Internal Medicine

## 2017-06-24 ENCOUNTER — Encounter (HOSPITAL_BASED_OUTPATIENT_CLINIC_OR_DEPARTMENT_OTHER): Payer: Self-pay | Admitting: Internal Medicine

## 2017-06-24 VITALS — BP 120/70 | HR 97 | Ht 62.0 in | Wt 84.0 lb

## 2017-06-24 DIAGNOSIS — R109 Unspecified abdominal pain: Secondary | ICD-10-CM | POA: Insufficient documentation

## 2017-06-24 DIAGNOSIS — Z5181 Encounter for therapeutic drug level monitoring: Secondary | ICD-10-CM | POA: Insufficient documentation

## 2017-06-24 DIAGNOSIS — E039 Hypothyroidism, unspecified: Secondary | ICD-10-CM | POA: Insufficient documentation

## 2017-06-24 DIAGNOSIS — R634 Abnormal weight loss: Secondary | ICD-10-CM | POA: Insufficient documentation

## 2017-06-24 DIAGNOSIS — K5900 Constipation, unspecified: Principal | ICD-10-CM | POA: Insufficient documentation

## 2017-06-24 DIAGNOSIS — E785 Hyperlipidemia, unspecified: Secondary | ICD-10-CM | POA: Insufficient documentation

## 2017-06-24 DIAGNOSIS — Z79899 Other long term (current) drug therapy: Secondary | ICD-10-CM | POA: Insufficient documentation

## 2017-06-24 LAB — URINALYSIS, MACROSCOPIC
BILIRUBIN: NEGATIVE mg/dL
BLOOD: NEGATIVE mg/dL
GLUCOSE: NEGATIVE mg/dL
PH: 6 (ref 5.0–7.0)
SPECIFIC GRAVITY: 1.025 (ref 1.010–1.025)
UROBILINOGEN: 0.2 mg/dL

## 2017-06-24 LAB — LIPID PANEL
CHOLESTEROL: 173 mg/dL (ref 0–199)
HDL CHOL: 62 mg/dL (ref >40)
HDL CHOL: 62 mg/dL (ref >40)
LDL DIRECT: 98 mg/dL (ref 0–99)
TRIGLYCERIDES: 102 mg/dL (ref 0–199)
VLDL CALC: 20 mg/dL (ref 0–50)

## 2017-06-24 LAB — COMPREHENSIVE METABOLIC PNL, FASTING
ALBUMIN: 4.3 g/dL (ref 3.2–4.6)
ALT (SGPT): 5 U/L (ref <=52)
ALT (SGPT): 5 U/L (ref <=52)
ANION GAP: 5 mmol/L
AST (SGOT): 13 U/L (ref <=35)
BUN: 15 mg/dL (ref 10–25)
ESTIMATED GFR: >60 mL/min/{1.73_m2}
GLUCOSE: 96 mg/dL (ref 70–110)
POTASSIUM: 4.6 mmol/L (ref 3.5–5.0)
PROTEIN TOTAL: 6.3 g/dL (ref 6.0–8.3)

## 2017-06-24 LAB — CBC
HCT: 39 % (ref 34.6–46.2)
HGB: 13.8 g/dL (ref 11.8–15.8)
MCH: 31.4 pg (ref 27.6–33.2)
MCHC: 35.3 g/dL (ref 32.6–35.4)
MCV: 88.9 fL (ref 82.3–96.7)
MCV: 88.9 fL (ref 82.3–96.7)
MPV: 9.2 fL (ref 6.6–10.2)
PLATELETS: 216 10*3/uL (ref 140–440)
RBC: 4.39 10*6/uL (ref 3.80–5.24)
RDW: 13.7 % (ref 12.4–15.2)
WBC: 7.2 10*3/uL (ref 3.5–10.3)

## 2017-06-24 LAB — THYROID STIMULATING HORMONE (SENSITIVE TSH): TSH: 0.037 u[IU]/mL — ABNORMAL LOW (ref 0.450–5.330)

## 2017-06-24 LAB — URINALYSIS, MICROSCOPIC

## 2017-06-24 LAB — THYROXINE, FREE (FREE T4): THYROXINE (T4), FREE: 1.61 ng/dL — ABNORMAL HIGH (ref 0.61–1.12)

## 2017-06-24 MED ORDER — IBUPROFEN 800 MG TABLET
800.00 mg | ORAL_TABLET | Freq: Three times a day (TID) | ORAL | 3 refills | Status: DC | PRN
Start: 2017-06-24 — End: 2018-12-22

## 2017-06-24 NOTE — Progress Notes (Signed)
[facility]   [address]  [place] [id number]  Dept: [phone]  Dept Fax: [phone]    Haley HughesBarbara L Cantrell  11/02/1934  [id number]    Date of Service: 06/24/2017  9:45 AM EST    Chief complaint:   Chief Complaint   Patient presents with    Follow Up 6 Months       HPI:     This is a case of a 82 y.o. year old female who comes in today for six-month follow-up.  Patient has had change in bowel habits.  Weight loss.  Abdominal pain. No fever chills or night sweats.  No melena or bright red rectal bleeding.    ROS:    CARDIAC:  No chest pain, DOE, or palpitations. No orthopnea or PND.    PULMONARY:  No cough, sputum, or hemoptysis. No fever,chills, or night sweats.    GI:  No nausea or vomiting. No abdominal pain. More constipated.          No melena or bright red rectal bleeding.    GU :  Voiding without difficulty .  No dysuria hematuria    NEURO: No headache , diplopia or loss of function of limbs.    Current Outpatient Medications   Medication Sig    atorvastatin (LIPITOR) 10 mg Oral Tablet     Ibuprofen (MOTRIN) 800 mg Oral Tablet Take 1 Tab (800 mg total) by mouth Three times a day as needed for Pain    levothyroxine (SYNTHROID) 75 mcg Oral Tablet Take 1 Tab (75 mcg total) by mouth Every morning    pravastatin (PRAVACHOL) 10 mg Oral Tablet Take 1 Tab (10 mg total) by mouth Every evening       Objective:     BP 120/70    Pulse 97    Ht 1.575 m (5\' 2" )    Wt (!) 38.1 kg (84 lb)    LMP  (LMP Unknown)    SpO2 96%    BMI 15.36 kg/m       General appearance: alert, oriented x 3, in her normal state, cooperative, not in apparent distress, appearing stated age   Neck: No lymphadenopathy or thyroid anomalies noted, carotids 2 + bilaterally without bruit  Lungs: clear to auscultation bilaterally. No crackles or wheeze noted. Normal respiratory effort  Heart: No JVD. Regular rate and rhythm, S1, S2 normal.  No gallop or rub. No murmur  Abdomen: soft, non-tender.  Bowel sounds normal. No hepatosplenomegaly.  Extremities: no cyanosis or edema.  Pulses intact.  No calf pain    Assessment/Plan     ENCOUNTER DIAGNOSES     ICD-10-CM   1. Acquired hypothyroidism.  No signs or symptoms of higher low thyroid.  Check TFTs. E03.9   2. Constipation, unspecified constipation type.  Patient unable to tolerate prep for colonoscopy or barium enemas.  Gets to nauseated and vomiting. K59.00   3. Weight loss.  Check laboratory studies proceed with CT scan R63.4   4. Abdominal pain, unspecified abdominal location.  Again proceed with CT scan patient unable to take prepped for colonoscopy or barium enema. R10.9   5. High risk medication use.  Check CBC liver function renal function Z79.899   6. Hyperlipidemia, unspecified hyperlipidemia type.  Check lipid panel E78.5                   Orders Placed This Encounter    CT Abdomen/Pelvis W/WO  CBC    COMPREHENSIVE METABOLIC PNL, FASTING    URINALYSIS, MACROSCOPIC AND MICROSCOPIC    LIPID PANEL    THYROID STIMULATING HORMONE (SENSITIVE TSH)    Thyroxine Free (Free T4)    Ibuprofen (MOTRIN) 800 mg Oral Tablet         The patient was given ample opportunity to ask questions and those questions were answered to the patient's satisfaction. The patient was encouraged to be involved in their own care, and all diagnoses, medications, and medication side-effects were discussed.  A copy of the patient's medication list was printed and given to the patient. A good faith effort was made to reconcile the patient's medications.  The patient was told to contact me with any additional questions or concerns, or go to the ED in an emergency.     Follow up: Return in about 4 weeks (around 07/22/2017).    This note was partially generated using MModal Fluency Direct system, and there may be some incorrect words, spellings, and punctuation that were not noted in checking the note before saving.    Harrie ForemanMichael Tangi Shroff, M.D.    [facility] - [facility]   [address]  [place] [phone]  Dept: [phone]  Dept Fax: [phone]

## 2017-06-25 ENCOUNTER — Telehealth (HOSPITAL_BASED_OUTPATIENT_CLINIC_OR_DEPARTMENT_OTHER): Payer: Self-pay | Admitting: Internal Medicine

## 2017-06-25 NOTE — Telephone Encounter (Signed)
Patient stated that last time was decreased that it has caused her a bunch of problems and she had to go see [place] Jean RosenthalJackson in [place] to get it straighten up. She said she would like to wait and discuss this in 4 weeks when she sees you again.

## 2017-06-25 NOTE — Telephone Encounter (Signed)
-----   Message from Marylee FlorasMichael T Angotti, MD sent at 06/25/2017  1:56 PM EST -----  Please call patient and informed that all labs are within acceptable limits.  However thyroid pill too big.  Decrease Synthroid to 50 mcg.  Dispense 30.  One p.o. q.day.  Eleven refills.  Please updated in EMR.  Recheck free T4 and TSH in 4 weeks.  Marylee FlorasMichael T Angotti, MD

## 2017-06-25 NOTE — Progress Notes (Signed)
Notified Patient   . Ars

## 2017-06-30 ENCOUNTER — Ambulatory Visit
Admission: RE | Admit: 2017-06-30 | Discharge: 2017-06-30 | Disposition: A | Discharge status: Home / Self Care | Payer: Medicare Other | Source: Ambulatory Visit | Attending: Interventional Radiology | Admitting: Interventional Radiology

## 2017-06-30 ENCOUNTER — Encounter: Payer: Self-pay | Admitting: Radiology

## 2017-06-30 DIAGNOSIS — N2889 Other specified disorders of kidney and ureter: Secondary | ICD-10-CM

## 2017-06-30 HISTORY — PX: IR RADIOLOGIST EVAL & MGMT: IMG5224

## 2017-06-30 NOTE — Progress Notes (Signed)
Chief Complaint: Patient was seen in consultation today for  Chief Complaint  Patient presents with  . Follow-up    Surveillance of Right Renal Mass    Referring Physician(s): Dr. Nila Nephew  History of Present Illness: Linda Sanders is a 82 y.o. 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 On routine surveillance imaging she was noted to have slight interval enlargement of a minimally enhancing lesion within the right kidney between May 2017 and June 2018. 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  We saw her back in June and discussed a few options including observation, biopsy, and treatment. Given that the lesion has been very slow growing without complicating features, she chose observation. She returns today after having follow up MRI. She has been pretty well. No changes to meds. She denies hematuria, abd pain, flank pain, wt changes.  Past Medical History:  Diagnosis Date  . 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 Hinton Rao.  s/p partial mastectomy, left breast xrt, chemo  . Allergic rhinitis   . Arthritis    "joints; right hip" (12/28/2015"  . Blurry vision 06/11/2015  . Chronic bronchitis ([place])   . Diarrhea 06/11/2015  . Ear fullness 06/11/2015  . GERD (gastroesophageal reflux disease)   . Hypertension   . Hypothyroidism   . Multiple allergies 03/29/2015  . 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 Nausea without vomiting 06/11/2015  . Pneumonia "several times"  . Polymyositis ([place])    Dr. Justine Null  . Pulmonary nodule   . Skull lesion 03/29/2015    Past Surgical History:  Procedure Laterality Date  . APPENDECTOMY    . BREAST BIOPSY Left   . CATARACT EXTRACTION W/ INTRAOCULAR LENS  IMPLANT, BILATERAL Bilateral   . DILATION AND  CURETTAGE OF UTERUS    . IR RADIOLOGIST EVAL & MGMT  12/30/2016  . LAPAROSCOPIC CHOLECYSTECTOMY    . MASTECTOMY, PARTIAL Left   . NASAL SINUS SURGERY    . POSTERIOR LUMBAR FUSION  10/2015   "have screws and mesh"  . TUBAL LIGATION    . VAGINAL HYSTERECTOMY      Allergies: Augmentin [amoxicillin-pot clavulanate]; Avelox [moxifloxacin hcl in nacl]; Clarithromycin; Codeine; Cymbalta [duloxetine hcl]; Metronidazole and related; Moxifloxacin; Amoxicillin-pot clavulanate; and Clarithromycin  Medications: Prior to Admission medications   Medication Sig Start Date End Date Taking? Authorizing Provider  acetaminophen (TYLENOL) 325 MG tablet Take 650 mg by mouth every 6 (six) hours as needed for mild pain.    Yes [provider]  calcium carbonate (TUMS - DOSED IN MG ELEMENTAL CALCIUM) 500 MG chewable tablet Chew 1 tablet by mouth daily as needed for indigestion or heartburn.   Yes [provider]  carvedilol (COREG) 12.5 MG tablet Take 12.5 mg by mouth 2 (two) times daily. 04/12/14  Yes [provider]  Cholecalciferol (VITAMIN D3) 50000 UNITS CAPS Take 1 capsule by mouth once a week. No specific day   Yes [provider]  famotidine-calcium carbonate-magnesium hydroxide (PEPCID COMPLETE) 10-800-165 MG chewable tablet Chew 1 tablet by mouth daily.    Yes [provider]  gabapentin (NEURONTIN) 400 MG capsule Take 400 mg by mouth 3 (three) times daily.  08/19/10  Yes [provider]  levothyroxine (SYNTHROID, LEVOTHROID) 50 MCG tablet Take 1.5 tablets (75 mcg total) by  mouth daily. 12/30/15  Yes Dhungel, Nishant, MD  Polyethyl Glycol-Propyl Glycol (SYSTANE FREE OP) Place 1 drop into both eyes daily.   Yes [provider]  predniSONE (DELTASONE) 5 MG tablet Take 3 mg by mouth daily.    Yes [provider]  traMADol (ULTRAM) 50 MG tablet Take 50 mg by mouth 3 (three) times daily. Taking every day per patient 08/19/10  Yes [provider]  carbamide peroxide (DEBROX) 6.5 % otic solution Place 5 drops into both ears 2 (two) times daily. Patient not taking: Reported on 06/30/2017 06/11/15   Tommy Medal, Lavell Islam, MD  guaiFENesin (MUCINEX) 600 MG 12 hr tablet Take 1 tablet (600 mg total) by mouth 2 (two) times daily. Patient not taking: Reported on 06/30/2017 12/30/15   Dhungel, Flonnie Overman, MD  guaiFENesin-dextromethorphan (ROBITUSSIN DM) 100-10 MG/5ML syrup Take 5 mLs by mouth every 4 (four) hours as needed for cough. Patient not taking: Reported on 06/30/2017 12/30/15   Dhungel, Flonnie Overman, MD  mirtazapine (REMERON) 30 MG tablet Take 15 mg by mouth at bedtime.    [provider]  Misc. Devices (ACAPELLA) MISC 1 application by Does not apply route 3 (three) times daily. Patient not taking: Reported on 12/30/2016 12/30/15   Dhungel, Flonnie Overman, MD  Multiple Vitamin (MULTIVITAMIN) capsule Take 1 capsule by mouth daily.      [provider]  oxyCODONE-acetaminophen (PERCOCET/ROXICET) 5-325 MG tablet Take 2 tablets by mouth every 6 (six) hours as needed for moderate pain. Patient not taking: Reported on 12/30/2016 12/30/15   Louellen Molder, MD     Family History  Problem Relation Age of Onset  . Allergies Mother   . Allergies Other        children  . Rheum arthritis Mother   . Lymphoma Sister     Social History   Socioeconomic History  . Marital status: Widowed    Spouse name: Not on file  . Number of children: Y  . Years of education: Not on file  . Highest education level: Not on file  Social Needs  . Financial resource strain: Not on file  . Food insecurity - worry: Not on file  . Food insecurity - inability: Not on file  . Transportation needs - medical: Not on file  . Transportation needs - non-medical: Not on file  Occupational History  . Occupation: retired from office work  Tobacco Use  . Smoking status: Never Smoker  . Smokeless tobacco: Never Used  Substance and Sexual Activity  .  Alcohol use: No  . Drug use: No  . Sexual activity: Not on file  Other Topics Concern  . Not on file  Social History Narrative  . Not on file     Review of Systems: A 12 point ROS discussed and pertinent positives are indicated in the HPI above.  All other systems are negative.  Review of Systems  Vital Signs: BP (!) 152/81   Pulse (!) 59   Temp 97.7 F (36.5 C) (Oral)   Resp 15   Ht 5' 1.5" (1.562 m)   Wt 135 lb (61.2 kg)   SpO2 99%   BMI 25.09 kg/m   Physical Exam  Constitutional: She is oriented to person, place, and time. She appears well-developed. No distress.  HENT:  Head: Normocephalic.  Mouth/Throat: Oropharynx is clear and moist.  Neck: Normal range of motion. No JVD present.  Cardiovascular: Normal rate, regular rhythm and normal heart sounds.  Pulmonary/Chest: Effort normal and breath sounds normal.  No respiratory distress.  Abdominal: Soft. Bowel sounds are normal. She exhibits no mass. There is no tenderness.  Neurological: She is alert and oriented to person, place, and time.  Psychiatric: She has a normal mood and affect.      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 We reviewed options of biopsy as well as cryoablation as a potential treatment option. She would be higher risk for general anesthesia, though she is supposedly getting set up to see a pulmonologist, who may be able to better assess her risks of GET. In the interim, we will continue 6 month surveillance of this lesion by MRI.  Thank you for this interesting consult.  I greatly enjoyed meeting KENSLIE ABBRUZZESE and look forward to participating in their care.  A copy of this report was sent to the requesting provider on this date.  Electronically Signed: Ascencion Dike 06/30/2017, 1:54 PM   I spent a total of 20 minutes in face to face in clinical consultation, greater than 50% of which was  counseling/coordinating care for right renal mass

## 2017-07-01 ENCOUNTER — Ambulatory Visit: Payer: Medicare Other | Admitting: Internal Medicine

## 2017-07-01 ENCOUNTER — Encounter: Payer: Self-pay | Admitting: Internal Medicine

## 2017-07-01 ENCOUNTER — Ambulatory Visit
Admission: RE | Admit: 2017-07-01 | Discharge: 2017-07-01 | Disposition: A | Discharge status: Home / Self Care | Payer: Medicare Other | Source: Ambulatory Visit | Attending: Internal Medicine | Admitting: Internal Medicine

## 2017-07-01 VITALS — BP 160/83 | HR 68 | Temp 98.3°F | Ht 61.5 in | Wt 137.0 lb

## 2017-07-01 DIAGNOSIS — A310 Pulmonary mycobacterial infection: Secondary | ICD-10-CM

## 2017-07-01 NOTE — Progress Notes (Signed)
RFV: pulmonary MAC check-in Patient ID: Linda Sanders, female   DOB: 1934/10/21, 82 y.o.   MRN: [id number]  HPI Linda Sanders is an 82yo F with multiple medical problems including pulmonary MAC. We had last seen her roughly 1 year ago and discussed holding off treatment since she was not terribly symptomatic. She reports that in this past year, she has had several hospitalization including one for pneumonia in September. But allso hospitalized for colitis. She gets regular surveillance scans for renal cancer. I have reviewed her C/A/P CT from June 2018 that shows mild apical disease on the right and nodules,bronchiectasis on the left   Feels productive cough 2-3 x per day. Decrease exercise endurance and increase work of breathing. Difficult to tell if this is worsening disease vs. deconditioning  Outpatient Encounter Medications as of 07/01/2017  Medication Sig  . acetaminophen (TYLENOL) 325 MG tablet Take 650 mg by mouth every 6 (six) hours as needed for mild pain.   . calcium carbonate (TUMS - DOSED IN MG ELEMENTAL CALCIUM) 500 MG chewable tablet Chew 1 tablet by mouth daily as needed for indigestion or heartburn.  . carbamide peroxide (DEBROX) 6.5 % otic solution Place 5 drops into both ears 2 (two) times daily.  . carvedilol (COREG) 12.5 MG tablet Take 12.5 mg by mouth 2 (two) times daily.  . Cholecalciferol (VITAMIN D3) 50000 UNITS CAPS Take 1 capsule by mouth once a week. No specific day  . famotidine-calcium carbonate-magnesium hydroxide (PEPCID COMPLETE) 10-800-165 MG chewable tablet Chew 1 tablet by mouth daily.   Marland Kitchen gabapentin (NEURONTIN) 400 MG capsule Take 400 mg by mouth 3 (three) times daily.   Marland Kitchen guaiFENesin (MUCINEX) 600 MG 12 hr tablet Take 1 tablet (600 mg total) by mouth 2 (two) times daily.  Marland Kitchen levothyroxine (SYNTHROID, LEVOTHROID) 50 MCG tablet Take 1.5 tablets (75 mcg total) by mouth daily.  . Multiple Vitamin (MULTIVITAMIN) capsule Take 1 capsule by mouth daily.    Linda Sanders Glycol-Propyl Glycol (SYSTANE FREE OP) Place 1 drop into both eyes daily.  . predniSONE (DELTASONE) 5 MG tablet Take 3 mg by mouth daily.   . traMADol (ULTRAM) 50 MG tablet Take 50 mg by mouth 3 (three) times daily. Taking every day per patient  . [DISCONTINUED] Misc. Devices (ACAPELLA) MISC 1 application by Does not apply route 3 (three) times daily.  Marland Kitchen guaiFENesin-dextromethorphan (ROBITUSSIN DM) 100-10 MG/5ML syrup Take 5 mLs by mouth every 4 (four) hours as needed for cough. (Patient not taking: Reported on 07/01/2017)  . mirtazapine (REMERON) 30 MG tablet Take 15 mg by mouth at bedtime.  Marland Kitchen oxyCODONE-acetaminophen (PERCOCET/ROXICET) 5-325 MG tablet Take 2 tablets by mouth every 6 (six) hours as needed for moderate pain. (Patient not taking: Reported on 07/01/2017)   No facility-administered encounter medications on file as of 07/01/2017.      Patient Active Problem List   Diagnosis Date Noted  . Fibromyalgia 12/28/2015  . 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 Dehydration 12/28/2015  . Hyponatremia 12/28/2015  . CAP (community acquired pneumonia) 12/28/2015  . Ear fullness 06/11/2015  . Blurry vision 06/11/2015  . Nausea without vomiting 06/11/2015  . Diarrhea 06/11/2015  . 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 Skull lesion 03/29/2015  . Multiple allergies 03/29/2015  . Chest tightness 11/26/2010  . Pulmonary nodules/lesions, multiple 11/26/2010  . SPRAIN&STRAIN OF UNSPECIFIED SITE OF KNEE&LEG 06/14/2009  . 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 Essential hypertension 07/19/2008  .  ALLERGIC RHINITIS 07/19/2008  . ADENOCARCINOMA, BREAST, HX OF 07/19/2008     Health Maintenance Due  Topic Date Due  . TETANUS/TDAP  03/16/1956  . DEXA SCAN  03/16/2002  . PNA vac Low Risk Adult (2 of 2 - PCV13) 01/17/2015  . INFLUENZA VACCINE  02/11/2017     Review of Systems Mildly  productive cough. Decreased exercise tolerance. Otherwise weight is stable. No fever/chills/nightsweats. Physical Exam   BP (!) 160/83   Pulse 68   Temp 98.3 F (36.8 C)   Ht 5' 1.5" (1.562 m)   Wt 137 lb (62.1 kg)   BMI 25.47 kg/m   Physical Exam  Constitutional:  oriented to person, place, and time. appears well-developed and well-nourished. No distress.  HENT: [place]/AT, PERRLA, no scleral icterus Mouth/Throat: Oropharynx is clear and moist. No oropharyngeal exudate.  Cardiovascular: Normal rate, regular rhythm and normal heart sounds. Exam reveals no gallop and no friction rub.  No murmur heard.  Pulmonary/Chest: Effort normal and breath sounds normal. No respiratory distress.  has no wheezes.  Neck = supple, no nuchal rigidity Lymphadenopathy: no cervical adenopathy. No axillary adenopathy Neurological: alert and oriented to person, place, and time. Facial tick Skin: Skin is warm and dry. No rash noted. No erythema.  Psychiatric: a normal mood and affect.  behavior is normal.   CBC Lab Results  Component Value Date   WBC 9.6 12/29/2015   RBC 3.31 (L) 12/29/2015   HGB 10.3 (L) 12/29/2015   HCT 33.2 (L) 12/29/2015   PLT 267 12/29/2015   MCV 100.3 (H) 12/29/2015   MCH 31.1 12/29/2015   MCHC 31.0 12/29/2015   RDW 13.7 12/29/2015   LYMPHSABS 1.4 12/29/2015   MONOABS 1.3 (H) 12/29/2015   EOSABS 0.3 12/29/2015    BMET Lab Results  Component Value Date   NA 131 (L) 12/29/2015   K 4.4 12/29/2015   CL 99 (L) 12/29/2015   CO2 24 12/29/2015   GLUCOSE 89 12/29/2015   BUN 8 12/29/2015   CREATININE 0.87 12/29/2015   CALCIUM 9.3 12/29/2015   GFRNONAA >60 12/29/2015   GFRAA >60 12/29/2015      Assessment and Plan  Pulmonary hygiene/mucociliary clearance - recommended to use accapella valve once-twice a day to help with clearing mucus - can use mucinex if feels expecterant is thick   Pulmonary MAC - will get chest cxr today - will give sputum AFB cx to see if still  can isolate ( we have + specimen from 2016 and 2017) - refer to pulmonary to see if also makes sense to get PFTs  - her symptoms slightly worse but not convinced it is due to MAC. Would like to see if still isolating and do more pulmonary hygiene. - she has numerous abtx intolerances which would make treatment challenging if she should need it.  Spent 25 min with patient in regards to counseling on next steps of evaluation/tx for pulmonary MAC

## 2017-07-02 ENCOUNTER — Ambulatory Visit (HOSPITAL_BASED_OUTPATIENT_CLINIC_OR_DEPARTMENT_OTHER): Payer: Self-pay | Admitting: Internal Medicine

## 2017-07-02 DIAGNOSIS — R109 Unspecified abdominal pain: Secondary | ICD-10-CM

## 2017-07-15 ENCOUNTER — Ambulatory Visit
Admission: RE | Admit: 2017-07-15 | Discharge: 2017-07-15 | Disposition: A | Discharge status: Home / Self Care | Payer: Medicare Other | Source: Ambulatory Visit | Attending: Internal Medicine | Admitting: Internal Medicine

## 2017-07-15 ENCOUNTER — Other Ambulatory Visit (HOSPITAL_COMMUNITY): Payer: Self-pay

## 2017-07-15 DIAGNOSIS — R109 Unspecified abdominal pain: Secondary | ICD-10-CM | POA: Insufficient documentation

## 2017-07-16 NOTE — Progress Notes (Signed)
Notified Patient   . Ars

## 2017-07-22 ENCOUNTER — Encounter (HOSPITAL_BASED_OUTPATIENT_CLINIC_OR_DEPARTMENT_OTHER): Payer: Self-pay | Admitting: Internal Medicine

## 2017-08-20 ENCOUNTER — Ambulatory Visit: Payer: Medicare Other | Admitting: Emergency Medicine

## 2017-08-20 ENCOUNTER — Encounter: Payer: Self-pay | Admitting: Emergency Medicine

## 2017-08-20 DIAGNOSIS — R918 Other nonspecific abnormal finding of lung field: Secondary | ICD-10-CM | POA: Diagnosis not present

## 2017-08-20 DIAGNOSIS — A310 Pulmonary mycobacterial infection: Secondary | ICD-10-CM | POA: Diagnosis not present

## 2017-08-20 DIAGNOSIS — J479 Bronchiectasis, uncomplicated: Secondary | ICD-10-CM | POA: Diagnosis not present

## 2017-08-20 NOTE — Assessment & Plan Note (Signed)
Stable by serial CT scans from [facility].  Suspect related to her mycobacterial disease.

## 2017-08-20 NOTE — Patient Instructions (Addendum)
We will perform pulmonary function testing to compare with your priors.  We will plan to continue to follow your CT scans of the chest annually Start using your flutter valve twice a day to see if this helps with mucous clearance.  Depending on your pulmonary function testing we may decide to initiate inhaled medication will be follow-up. We should still try to get your sputum sample to check for culture data as planned by Dr. Graylon Good. Follow with Dr Lamonte Sakai next available after pulmonary function testing

## 2017-08-20 NOTE — Assessment & Plan Note (Signed)
Culture positive in 2017, repeat sputum currently pending.  She continues to make green sputum.  Treatment has been deferred so far.

## 2017-08-20 NOTE — Progress Notes (Signed)
Subjective:    Patient ID: Linda Sanders, female    DOB: 07/22/1934, 83 y.o.   MRN: [id number]  HPI 83 year old never smoker with a history of breast cancer, hypertension, GERD, allergic rhinitis, polymyositis.  She has been seen here before by Dr. 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  She was in fact treated in 2010 and 2011.  Currently followed by Dr. Baxter Flattery in infectious diseases.  She had documented [facility] 12/29/15 (sensitivities available), then had AFB cx ordered 07/01/17, hasn't been done yet. She has been on observation, not treated. Most recent CT scan of the chest June 2018 shows some bilateral upper lobe bronchiectatic change with some nodular infiltrate and mucus impaction, stable scattered nodules elsewhere.  No evidence of groundglass or pneumonitis.  She is coughing frequently, can happen all day. Not always productive, but usually in the am will be. Often green plugs. She has a flutter valve but doesn't know how to use it. She has dyspnea with activity - walking, climbing hills. She is able to do shopping. Some difficulty w cleaning the house. She c/o longstanding leg weakness and shakiness.   She had PFT 05/25/98 > normal airflows, hyperinflated volumes, mildly decreased diffusion.    Review of Systems  Past Medical History:  Diagnosis Date  . 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 Hinton Rao.  s/p partial mastectomy, left breast xrt, chemo  . Allergic rhinitis   . Arthritis    "joints; right hip" (12/28/2015"  . Blurry vision 06/11/2015  . Chronic bronchitis ([place])   . Diarrhea 06/11/2015  . Ear fullness 06/11/2015  . GERD (gastroesophageal reflux disease)   . Hypertension   . Hypothyroidism   . Multiple allergies 03/29/2015  . 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 Nausea without vomiting 06/11/2015  . Pneumonia "several times"  . Polymyositis ([place])    Dr. Justine Null  . Pulmonary nodule   . Skull lesion 03/29/2015     Family History  Problem Relation Age  of Onset  . Allergies Mother   . Allergies Other        children  . Rheum arthritis Mother   . Lymphoma Sister      Social History   Socioeconomic History  . Marital status: Widowed    Spouse name: Not on file  . Number of children: Y  . Years of education: Not on file  . Highest education level: Not on file  Social Needs  . Financial resource strain: Not on file  . Food insecurity - worry: Not on file  . Food insecurity - inability: Not on file  . Transportation needs - medical: Not on file  . Transportation needs - non-medical: Not on file  Occupational History  . Occupation: retired from office work  Tobacco Use  . Smoking status: Never Smoker  . Smokeless tobacco: Never Used  Substance and Sexual Activity  . Alcohol use: No  . Drug use: No  . Sexual activity: Not on file  Other Topics Concern  . Not on file  Social History Narrative  . Not on file     Allergies  Allergen Reactions  . Augmentin [Amoxicillin-Pot Clavulanate] Nausea Only  . Avelox [Moxifloxacin Hcl In Nacl]     Upset stomach   . Clarithromycin     Upset stomach   . Codeine     Vomiting   . Cymbalta [Duloxetine Hcl]     Blurred vision   . Metronidazole And Related     Rash, itching   .  Moxifloxacin     Upset stomach   . Amoxicillin-Pot Clavulanate Nausea And Vomiting  . Clarithromycin Diarrhea     Outpatient Medications Prior to Visit  Medication Sig Dispense Refill  . acetaminophen (TYLENOL) 325 MG tablet Take 650 mg by mouth every 6 (six) hours as needed for mild pain.     . calcium carbonate (TUMS - DOSED IN MG ELEMENTAL CALCIUM) 500 MG chewable tablet Chew 1 tablet by mouth daily as needed for indigestion or heartburn.    . carbamide peroxide (DEBROX) 6.5 % otic solution Place 5 drops into both ears 2 (two) times daily. 15 mL 5  . carvedilol (COREG) 12.5 MG tablet Take 25 mg by mouth 2 (two) times daily.     . Cholecalciferol (VITAMIN D3) 50000 UNITS CAPS Take 1 capsule by mouth once  a week. No specific day    . escitalopram (LEXAPRO) 10 MG tablet Take 10 mg by mouth at bedtime.    . famotidine-calcium carbonate-magnesium hydroxide (PEPCID COMPLETE) 10-800-165 MG chewable tablet Chew 1 tablet by mouth daily.     Marland Kitchen gabapentin (NEURONTIN) 400 MG capsule Take 400 mg by mouth 2 (two) times daily.     Marland Kitchen guaiFENesin (MUCINEX) 600 MG 12 hr tablet Take 1 tablet (600 mg total) by mouth 2 (two) times daily. 15 tablet 0  . levothyroxine (SYNTHROID, LEVOTHROID) 50 MCG tablet Take 1.5 tablets (75 mcg total) by mouth daily. (Patient taking differently: Take 50 mcg by mouth daily. ) 60 tablet 0  . Polyethyl Glycol-Propyl Glycol (SYSTANE FREE OP) Place 1 drop into both eyes daily.    . predniSONE (DELTASONE) 5 MG tablet Take 3 mg by mouth daily.     . traMADol (ULTRAM) 50 MG tablet Take 50 mg by mouth 3 (three) times daily. Taking every day per patient    . guaiFENesin-dextromethorphan (ROBITUSSIN DM) 100-10 MG/5ML syrup Take 5 mLs by mouth every 4 (four) hours as needed for cough. (Patient not taking: Reported on 07/01/2017) 118 mL 0  . mirtazapine (REMERON) 30 MG tablet Take 15 mg by mouth at bedtime.    . Multiple Vitamin (MULTIVITAMIN) capsule Take 1 capsule by mouth daily.      Marland Kitchen oxyCODONE-acetaminophen (PERCOCET/ROXICET) 5-325 MG tablet Take 2 tablets by mouth every 6 (six) hours as needed for moderate pain. (Patient not taking: Reported on 07/01/2017) 15 tablet 0   No facility-administered medications prior to visit.         Objective:   Physical Exam Vitals:   08/20/17 1341  BP: 124/72  Pulse: (!) 54  SpO2: 95%  Weight: 131 lb 6.4 oz (59.6 kg)  Height: 5' 1.5" (1.562 m)   Gen: Pleasant, elderly woman, in no distress,  normal affect  ENT: No lesions,  mouth clear,  oropharynx clear, no postnasal drip  Neck: No JVD, no stridor  Lungs: No use of accessory muscles, no crackles or wheezes  Cardiovascular: RRR, heart sounds normal, no murmur or gallops, no peripheral  edema  Musculoskeletal: No deformities, no cyanosis or clubbing  Neuro: alert, oriented, some poor memory with regard to the remote workup but memory of the recent studies is good  Skin: Warm, no lesions or rash     Assessment & Plan:  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  She continues to make green sputum.  Treatment has been deferred so far.  Pulmonary nodules/lesions, multiple Stable by serial CT scans from [facility].  Suspect related to her mycobacterial  disease.  Bronchiectasis ([place]) Involving the right upper lobe, left upper lobe with some associated mucoid impaction and nodular infiltrate.  Peers to be radiographically stable.  She does have daily cough, complains of dyspnea.  I agree that she needs pulmonary function testing to assess for obstructive lung disease.  She needs to use flutter valve, has one but is unsure how to use it.  We will teach her today.  Depending on her pulmonary function testing we may decide to initiate bronchodilators.  I will follow-up with her after the PFT to discuss  Baltazar Apo, MD, PhD 08/20/2017, 2:08 PM [facility] [phone] or if no answer [phone]

## 2017-08-20 NOTE — Assessment & Plan Note (Signed)
Involving the right upper lobe, left upper lobe with some associated mucoid impaction and nodular infiltrate.  Peers to be radiographically stable.  She does have daily cough, complains of dyspnea.  I agree that she needs pulmonary function testing to assess for obstructive lung disease.  She needs to use flutter valve, has one but is unsure how to use it.  We will teach her today.  Depending on her pulmonary function testing we may decide to initiate bronchodilators.  I will follow-up with her after the PFT to discuss

## 2017-09-17 ENCOUNTER — Encounter: Payer: Self-pay | Admitting: Emergency Medicine

## 2017-09-17 ENCOUNTER — Ambulatory Visit (INDEPENDENT_AMBULATORY_CARE_PROVIDER_SITE_OTHER): Payer: Medicare Other | Admitting: Emergency Medicine

## 2017-09-17 ENCOUNTER — Ambulatory Visit: Payer: Medicare Other | Admitting: Emergency Medicine

## 2017-09-17 DIAGNOSIS — J479 Bronchiectasis, uncomplicated: Secondary | ICD-10-CM | POA: Diagnosis not present

## 2017-09-17 DIAGNOSIS — R0600 Dyspnea, unspecified: Secondary | ICD-10-CM | POA: Insufficient documentation

## 2017-09-17 LAB — PULMONARY FUNCTION TEST
DL/VA % pred: 85 %
DL/VA: 3.81 ml/min/mmHg/L
DLCO unc % pred: 57 %
DLCO unc: 11.94 ml/min/mmHg
FEF 25-75 Post: 1.1 L/sec
FEF 25-75 Pre: 0.75 L/sec
FEF2575-%Change-Post: 46 %
FEF2575-%Pred-Post: 88 %
FEF2575-%Pred-Pre: 60 %
FEV1-%Change-Post: 6 %
FEV1-%Pred-Post: 81 %
FEV1-%Pred-Pre: 76 %
FEV1-Post: 1.38 L
FEV1-Pre: 1.29 L
FEV1FVC-%Change-Post: 7 %
FEV1FVC-%Pred-Pre: 97 %
FEV6-%Change-Post: 0 %
FEV6-%Pred-Post: 81 %
FEV6-%Pred-Pre: 82 %
FEV6-Post: 1.75 L
FEV6-Pre: 1.76 L
FEV6FVC-%Change-Post: 1 %
FEV6FVC-%Pred-Post: 106 %
FEV6FVC-%Pred-Pre: 104 %
FVC-%Change-Post: -1 %
FVC-%Pred-Post: 76 %
FVC-%Pred-Pre: 78 %
FVC-Post: 1.76 L
FVC-Pre: 1.79 L
Post FEV1/FVC ratio: 78 %
Post FEV6/FVC ratio: 100 %
Pre FEV1/FVC ratio: 72 %
Pre FEV6/FVC Ratio: 99 %
RV % pred: 105 %
RV: 2.39 L
TLC % pred: 88 %
TLC: 4.12 L

## 2017-09-17 NOTE — Progress Notes (Signed)
Patient seen in the office today and instructed on use of Symbicort.  Patient expressed understanding and demonstrated technique. ° °

## 2017-09-17 NOTE — Assessment & Plan Note (Signed)
She has a repeat CT scan of the chest annually with oncology.  We will follow this to look for interval change.  Depending on these results and her clinical status we will decide whether there is any indication to treat mycobacterial disease

## 2017-09-17 NOTE — Assessment & Plan Note (Signed)
Pulmonary function testing shows mixed obstruction and restriction.  She may benefit from a bronchodilator.  I will start her on Symbicort to see if she benefits.

## 2017-09-17 NOTE — Assessment & Plan Note (Signed)
Not currently on therapy.  She would like to defer if possible.  I will try to obtain a respiratory culture, AFB culture since she has not had this done yet (ordered in December by Dr. Baxter Flattery).  Follow CT scan as below

## 2017-09-17 NOTE — Patient Instructions (Signed)
Get your CT scan of the chest as planned with oncology.  We will review this when it is available. We will do a trial of starting Symbicort 2 puffs twice a day.  Remember to rinse and gargle after using this medication.  If you benefit from the Symbicort, if it makes your breathing better, then we will continue it. We will try to obtain an AFB culture with [facility]  in [place].  Follow with Dr Lamonte Sakai in 6 weeks to discuss your improvement on the inhaled medication, or sooner if you have any problems

## 2017-09-17 NOTE — Progress Notes (Signed)
Subjective:    Patient ID: Linda Sanders, female    DOB: 01-09-1935, 83 y.o.   MRN: [id number]  HPI 83 year old never smoker with a history of breast cancer, hypertension, GERD, allergic rhinitis, polymyositis.  She has been seen here before by Dr. 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  She was in fact treated in 2010 and 2011.  Currently followed by Dr. Baxter Flattery in infectious diseases.  She had documented [facility] 12/29/15 (sensitivities available), then had AFB cx ordered 07/01/17, hasn't been done yet. She has been on observation, not treated. Most recent CT scan of the chest June 2018 shows some bilateral upper lobe bronchiectatic change with some nodular infiltrate and mucus impaction, stable scattered nodules elsewhere.  No evidence of groundglass or pneumonitis.  She is coughing frequently, can happen all day. Not always productive, but usually in the am will be. Often green plugs. She has a flutter valve but doesn't know how to use it. She has dyspnea with activity - walking, climbing hills. She is able to do shopping. Some difficulty w cleaning the house. She c/o longstanding leg weakness and shakiness.   She had PFT 05/25/98 > normal airflows, hyperinflated volumes, mildly decreased diffusion.   ROV 09/17/17 --follow-up visit for patient with a history of bronchiectasis and history of Mycobacterium avium.  Repeat cultures were supposed to be done in 06/2017 but I do not see the results.  She was last positive in 12/2015.  Treatment was deferred.  She was having cough and dyspnea and we performed pulmonary function testing today.  I have reviewed.  This shows evidence for mixed obstruction and restriction, moderate decrease in her FEV1 at 1.29 L (76% predicted) she had a significant improvement in her FEF 25-75%. She remains SOB w activity. She is on low dose pred for polymyositis.     Review of Systems  Past Medical History:  Diagnosis Date  . 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  Hinton Rao.  s/p partial mastectomy, left breast xrt, chemo  . Allergic rhinitis   . Arthritis    "joints; right hip" (12/28/2015"  . Blurry vision 06/11/2015  . Chronic bronchitis ([place])   . Diarrhea 06/11/2015  . Ear fullness 06/11/2015  . GERD (gastroesophageal reflux disease)   . Hypertension   . Hypothyroidism   . Multiple allergies 03/29/2015  . 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 Nausea without vomiting 06/11/2015  . Pneumonia "several times"  . Polymyositis ([place])    Dr. Justine Null  . Pulmonary nodule   . Skull lesion 03/29/2015     Family History  Problem Relation Age of Onset  . Allergies Mother   . Allergies Other        children  . Rheum arthritis Mother   . Lymphoma Sister      Social History   Socioeconomic History  . Marital status: Widowed    Spouse name: Not on file  . Number of children: Y  . Years of education: Not on file  . Highest education level: Not on file  Social Needs  . Financial resource strain: Not on file  . Food insecurity - worry: Not on file  . Food insecurity - inability: Not on file  . Transportation needs - medical: Not on file  . Transportation needs - non-medical: Not on file  Occupational History  . Occupation: retired from office work  Tobacco Use  . Smoking status: Never Smoker  . Smokeless tobacco: Never Used  Substance and Sexual Activity  .  Alcohol use: No  . Drug use: No  . Sexual activity: Not on file  Other Topics Concern  . Not on file  Social History Narrative  . Not on file     Allergies  Allergen Reactions  . Augmentin [Amoxicillin-Pot Clavulanate] Nausea Only  . Avelox [Moxifloxacin Hcl In Nacl]     Upset stomach   . Clarithromycin     Upset stomach   . Codeine     Vomiting   . Cymbalta [Duloxetine Hcl]     Blurred vision   . Metronidazole And Related     Rash, itching   . Moxifloxacin     Upset stomach   . Amoxicillin-Pot Clavulanate Nausea And Vomiting  . Clarithromycin Diarrhea      Outpatient Medications Prior to Visit  Medication Sig Dispense Refill  . acetaminophen (TYLENOL) 325 MG tablet Take 650 mg by mouth every 6 (six) hours as needed for mild pain.     Marland Kitchen atorvastatin (LIPITOR) 20 MG tablet Take 20 mg by mouth daily.    . calcium carbonate (TUMS - DOSED IN MG ELEMENTAL CALCIUM) 500 MG chewable tablet Chew 1 tablet by mouth daily as needed for indigestion or heartburn.    . carbamide peroxide (DEBROX) 6.5 % otic solution Place 5 drops into both ears 2 (two) times daily. 15 mL 5  . carvedilol (COREG) 12.5 MG tablet Take 25 mg by mouth 2 (two) times daily.     . Cholecalciferol (VITAMIN D3) 50000 UNITS CAPS Take 1 capsule by mouth once a week. No specific day    . escitalopram (LEXAPRO) 10 MG tablet Take 10 mg by mouth at bedtime.    . famotidine-calcium carbonate-magnesium hydroxide (PEPCID COMPLETE) 10-800-165 MG chewable tablet Chew 1 tablet by mouth daily.     Marland Kitchen gabapentin (NEURONTIN) 400 MG capsule Take 400 mg by mouth 2 (two) times daily.     Marland Kitchen guaiFENesin (MUCINEX) 600 MG 12 hr tablet Take 1 tablet (600 mg total) by mouth 2 (two) times daily. 15 tablet 0  . levothyroxine (SYNTHROID, LEVOTHROID) 50 MCG tablet Take 1.5 tablets (75 mcg total) by mouth daily. (Patient taking differently: Take 50 mcg by mouth daily. ) 60 tablet 0  . predniSONE (DELTASONE) 5 MG tablet Take 3 mg by mouth daily.     . traMADol (ULTRAM) 50 MG tablet Take 50 mg by mouth 3 (three) times daily. Taking every day per patient    . Polyethyl Glycol-Propyl Glycol (SYSTANE FREE OP) Place 1 drop into both eyes daily.    Marland Kitchen guaiFENesin-dextromethorphan (ROBITUSSIN DM) 100-10 MG/5ML syrup Take 5 mLs by mouth every 4 (four) hours as needed for cough. (Patient not taking: Reported on 07/01/2017) 118 mL 0   No facility-administered medications prior to visit.         Objective:   Physical Exam Vitals:   09/17/17 1409 09/17/17 1413  BP:  (!) 142/80  Pulse:  67  SpO2:  97%  Weight: 131 lb  (59.4 kg)   Height: 5' 1.5" (1.562 m)    Gen: Pleasant, elderly woman, in no distress,  normal affect  ENT: No lesions,  mouth clear,  oropharynx clear, no postnasal drip  Neck: No JVD, no stridor  Lungs: No use of accessory muscles, no crackles or wheezes  Cardiovascular: RRR, heart sounds normal, no murmur or gallops, no peripheral edema  Musculoskeletal: No deformities, no cyanosis or clubbing  Neuro: alert, oriented, some poor memory with regard to the remote workup but memory  of the recent studies is good  Skin: Warm, no lesions or rash     Assessment & Plan:  Bronchiectasis [facility]) She has a repeat CT scan of the chest annually with oncology.  We will follow this to look for interval change.  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  She would like to defer if possible.  I will try to obtain a respiratory culture, AFB culture since she has not had this done yet (ordered in December by Dr. Baxter Flattery).  Follow CT scan as below  Dyspnea Pulmonary function testing shows mixed obstruction and restriction.  She may benefit from a bronchodilator.  I will start her on Symbicort to see if she benefits.  Baltazar Apo, MD, PhD 09/17/2017, 2:42 PM [facility] [phone] or if no answer [phone]

## 2017-09-17 NOTE — Progress Notes (Signed)
PFT done today. 

## 2017-09-28 ENCOUNTER — Ambulatory Visit: Payer: Medicare Other | Admitting: Internal Medicine

## 2017-09-29 ENCOUNTER — Ambulatory Visit: Payer: Medicare Other | Admitting: Internal Medicine

## 2017-11-11 ENCOUNTER — Encounter: Payer: Self-pay | Admitting: Emergency Medicine

## 2017-11-11 ENCOUNTER — Ambulatory Visit: Payer: Medicare Other | Admitting: Emergency Medicine

## 2017-11-11 DIAGNOSIS — R053 Chronic cough: Secondary | ICD-10-CM

## 2017-11-11 DIAGNOSIS — S20211A Contusion of right front wall of thorax, initial encounter: Secondary | ICD-10-CM | POA: Insufficient documentation

## 2017-11-11 DIAGNOSIS — R05 Cough: Secondary | ICD-10-CM | POA: Diagnosis not present

## 2017-11-11 DIAGNOSIS — J479 Bronchiectasis, uncomplicated: Secondary | ICD-10-CM

## 2017-11-11 NOTE — Assessment & Plan Note (Signed)
Have been unable to get a sputum cx. The cough is non-productive and since she wants to avoid chronic abx, unclear to me that we should pursue this unless she clinically worsens.

## 2017-11-11 NOTE — Assessment & Plan Note (Addendum)
Non-productive and sounds more UA in nature than related to her bronchiectasis. She has breakthrough GERD, take TUMS intermittently.   If your cough continues and you may want to try increasing your Pepcid Complete to twice a day to see if you benefit. You may want to consider starting loratadine (Claritin) 10 mg once a day during the allergy season.  This may help with your cough. Follow with Dr Lamonte Sakai in 6 months or sooner if you have any problems

## 2017-11-11 NOTE — Assessment & Plan Note (Signed)
We will not perform a chest x-ray or rib x-ray right now but if your pain continues then it may be necessary to do so as we go forward.

## 2017-11-11 NOTE — Assessment & Plan Note (Signed)
We will not continue Symbicort, stop this medication now

## 2017-11-11 NOTE — Patient Instructions (Addendum)
If your cough continues and you may want to try increasing your Pepcid Complete to twice a day to see if you benefit. We will not continue Symbicort, stop this medication now You may want to consider starting loratadine (Claritin) 10 mg once a day during the allergy season.  This may help with your cough. We will not perform a chest x-ray or rib x-ray right now but if your pain continues then it may be necessary to do so as we go forward. Follow with Dr Lamonte Sakai in 6 months or sooner if you have any problems

## 2017-11-11 NOTE — Progress Notes (Signed)
Subjective:    Patient ID: Linda Sanders, female    DOB: September 27, 1934, 83 y.o.   MRN: [id number]  HPI 83 year old never smoker with a history of breast cancer, hypertension, GERD, allergic rhinitis, polymyositis.  She has been seen here before by Dr. 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  She was in fact treated in 2010 and 2011.  Currently followed by Dr. Baxter Flattery in infectious diseases.  She had documented [facility] 12/29/15 (sensitivities available), then had AFB cx ordered 07/01/17, hasn't been done yet. She has been on observation, not treated. Most recent CT scan of the chest June 2018 shows some bilateral upper lobe bronchiectatic change with some nodular infiltrate and mucus impaction, stable scattered nodules elsewhere.  No evidence of groundglass or pneumonitis.  She is coughing frequently, can happen all day. Not always productive, but usually in the am will be. Often green plugs. She has a flutter valve but doesn't know how to use it. She has dyspnea with activity - walking, climbing hills. She is able to do shopping. Some difficulty w cleaning the house. She c/o longstanding leg weakness and shakiness.   She had PFT 05/25/98 > normal airflows, hyperinflated volumes, mildly decreased diffusion.   ROV 09/17/17 --follow-up visit for patient with a history of bronchiectasis and history of Mycobacterium avium.  Repeat cultures were supposed to be done in 06/2017 but I do not see the results.  She was last positive in 12/2015.  Treatment was deferred.  She was having cough and dyspnea and we performed pulmonary function testing today.  I have reviewed.  This shows evidence for mixed obstruction and restriction, moderate decrease in her FEV1 at 1.29 L (76% predicted) she had a significant improvement in her FEF 25-75%. She remains SOB w activity. She is on low dose pred for polymyositis.    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  At her last visit we tried Symbicort to see if she would benefit. She didn't believe that it helped her very much, she forgot it sometimes. Hasn't helped her breathing, still has some cough, non-productive. She has some R lateral and breast pain from the fall. Denies any overt GERD as long as she takes pepcid, but she does describe a burning in her throat that leads her to cough sometimes.  We will try to get an AFB culture at her last visit but we were unsuccessful.   Review of Systems  Past Medical History:  Diagnosis Date  . 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 Hinton Rao.  s/p partial mastectomy, left breast xrt, chemo  . Allergic rhinitis   . Arthritis    "joints; right hip" (12/28/2015"  . Blurry vision 06/11/2015  . Chronic bronchitis ([place])   . Diarrhea 06/11/2015  . Ear fullness 06/11/2015  . GERD (gastroesophageal reflux disease)   . Hypertension   . Hypothyroidism   . Multiple allergies 03/29/2015  . 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 Nausea without vomiting 06/11/2015  . Pneumonia "several times"  . Polymyositis ([place])    Dr. Justine Null  . Pulmonary nodule   . Skull lesion 03/29/2015     Family History  Problem Relation Age of Onset  . Allergies Mother   . Allergies Other        children  . Rheum arthritis Mother   . Lymphoma  Sister      Social History   Socioeconomic History  . Marital status: Widowed    Spouse name: Not on file  . Number of children: Y  . Years of education: Not on file  . Highest education level: Not on file  Occupational History  . Occupation: retired from office work  Social Needs  . Financial resource strain: Not on file  . Food insecurity:    Worry: Not on file    Inability: Not on file  . Transportation needs:    Medical: Not on file    Non-medical: Not on file  Tobacco Use  . Smoking status: Never Smoker    . Smokeless tobacco: Never Used  Substance and Sexual Activity  . Alcohol use: No  . Drug use: No  . Sexual activity: Not on file  Lifestyle  . Physical activity:    Days per week: Not on file    Minutes per session: Not on file  . Stress: Not on file  Relationships  . Social connections:    Talks on phone: Not on file    Gets together: Not on file    Attends religious service: Not on file    Active member of club or organization: Not on file    Attends meetings of clubs or organizations: Not on file    Relationship status: Not on file  . Intimate partner violence:    Fear of current or ex partner: Not on file    Emotionally abused: Not on file    Physically abused: Not on file    Forced sexual activity: Not on file  Other Topics Concern  . Not on file  Social History Narrative  . Not on file     Allergies  Allergen Reactions  . Augmentin [Amoxicillin-Pot Clavulanate] Nausea Only  . Avelox [Moxifloxacin Hcl In Nacl]     Upset stomach   . Clarithromycin     Upset stomach   . Codeine     Vomiting   . Cymbalta [Duloxetine Hcl]     Blurred vision   . Metronidazole And Related     Rash, itching   . Moxifloxacin     Upset stomach   . Amoxicillin-Pot Clavulanate Nausea And Vomiting  . Clarithromycin Diarrhea     Outpatient Medications Prior to Visit  Medication Sig Dispense Refill  . acetaminophen (TYLENOL) 325 MG tablet Take 650 mg by mouth every 6 (six) hours as needed for mild pain.     . calcium carbonate (TUMS - DOSED IN MG ELEMENTAL CALCIUM) 500 MG chewable tablet Chew 1 tablet by mouth daily as needed for indigestion or heartburn.    . carbamide peroxide (DEBROX) 6.5 % otic solution Place 5 drops into both ears 2 (two) times daily. 15 mL 5  . carvedilol (COREG) 12.5 MG tablet Take 25 mg by mouth 2 (two) times daily.     . Cholecalciferol (VITAMIN D3) 50000 UNITS CAPS Take 1 capsule by mouth once a week. No specific day    . escitalopram (LEXAPRO) 10 MG tablet  Take 20 mg by mouth at bedtime.     . famotidine-calcium carbonate-magnesium hydroxide (PEPCID COMPLETE) 10-800-165 MG chewable tablet Chew 1 tablet by mouth daily.     Marland Kitchen gabapentin (NEURONTIN) 400 MG capsule Take 400 mg by mouth 2 (two) times daily.     Marland Kitchen levothyroxine (SYNTHROID, LEVOTHROID) 50 MCG tablet Take 1.5 tablets (75 mcg total) by mouth daily. (Patient taking differently: Take 50 mcg by  mouth daily. ) 60 tablet 0  . predniSONE (DELTASONE) 5 MG tablet Take 3 mg by mouth daily.     . traMADol (ULTRAM) 50 MG tablet Take 50 mg by mouth 2 (two) times daily. Taking every day per patient    . guaiFENesin (MUCINEX) 600 MG 12 hr tablet Take 1 tablet (600 mg total) by mouth 2 (two) times daily. (Patient not taking: Reported on 11/11/2017) 15 tablet 0  . atorvastatin (LIPITOR) 20 MG tablet Take 20 mg by mouth daily.     No facility-administered medications prior to visit.         Objective:   Physical Exam Vitals:   11/11/17 1453  BP: 114/66  Pulse: (!) 59  SpO2: 95%   Gen: Pleasant, elderly woman, in no distress,  normal affect  ENT: No lesions,  mouth clear,  oropharynx clear, no postnasal drip  Neck: No JVD, no stridor  Lungs: No use of accessory muscles, no crackles or wheezes  Cardiovascular: RRR, heart sounds normal, no murmur or gallops, no peripheral edema  Musculoskeletal: No deformities, no cyanosis or clubbing  Neuro: alert, oriented, some poor memory with regard to the remote workup but memory of the recent studies is good  Skin: Warm, no lesions or rash     Assessment & Plan:  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 The cough is non-productive and since she wants to avoid chronic abx, unclear to me that we should pursue this unless she clinically worsens.   Chronic cough Non-productive and sounds more UA in nature than related to her bronchiectasis. She has breakthrough GERD, take TUMS intermittently.   If your cough  continues and you may want to try increasing your Pepcid Complete to twice a day to see if you benefit. You may want to consider starting loratadine (Claritin) 10 mg once a day during the allergy season.  This may help with your cough. Follow with Dr Lamonte Sakai in 6 months or sooner if you have any problems  Chest wall contusion, right, initial encounter We will not perform a chest x-ray or rib x-ray right now but if your pain continues then it may be necessary to do so as we go forward.  Bronchiectasis [facility]) We will not continue Symbicort, stop this medication now  Baltazar Apo, MD, PhD 11/11/2017, 5:25 PM [facility] [phone] or if no answer [phone]

## 2017-12-22 ENCOUNTER — Encounter (HOSPITAL_BASED_OUTPATIENT_CLINIC_OR_DEPARTMENT_OTHER): Payer: Self-pay | Admitting: Internal Medicine

## 2017-12-22 ENCOUNTER — Ambulatory Visit: Payer: Medicare Other | Attending: Internal Medicine | Admitting: Internal Medicine

## 2017-12-22 VITALS — BP 114/64 | HR 67 | Resp 16 | Ht 62.0 in | Wt 86.4 lb

## 2017-12-22 DIAGNOSIS — Z853 Personal history of malignant neoplasm of breast: Secondary | ICD-10-CM | POA: Diagnosis not present

## 2017-12-22 DIAGNOSIS — Z923 Personal history of irradiation: Secondary | ICD-10-CM | POA: Diagnosis not present

## 2017-12-22 DIAGNOSIS — M810 Age-related osteoporosis without current pathological fracture: Secondary | ICD-10-CM | POA: Diagnosis not present

## 2017-12-22 DIAGNOSIS — R918 Other nonspecific abnormal finding of lung field: Secondary | ICD-10-CM | POA: Diagnosis not present

## 2017-12-22 DIAGNOSIS — K769 Liver disease, unspecified: Secondary | ICD-10-CM | POA: Diagnosis not present

## 2017-12-22 DIAGNOSIS — Z1509 Genetic susceptibility to other malignant neoplasm: Secondary | ICD-10-CM | POA: Diagnosis not present

## 2017-12-22 DIAGNOSIS — Z809 Family history of malignant neoplasm, unspecified: Secondary | ICD-10-CM | POA: Diagnosis not present

## 2017-12-22 DIAGNOSIS — N289 Disorder of kidney and ureter, unspecified: Secondary | ICD-10-CM | POA: Diagnosis not present

## 2017-12-22 DIAGNOSIS — M3320 Polymyositis, organ involvement unspecified: Secondary | ICD-10-CM | POA: Diagnosis not present

## 2017-12-22 DIAGNOSIS — Z9221 Personal history of antineoplastic chemotherapy: Secondary | ICD-10-CM | POA: Diagnosis not present

## 2017-12-22 DIAGNOSIS — K869 Disease of pancreas, unspecified: Secondary | ICD-10-CM | POA: Diagnosis not present

## 2017-12-22 DIAGNOSIS — E039 Hypothyroidism, unspecified: Secondary | ICD-10-CM | POA: Insufficient documentation

## 2017-12-22 DIAGNOSIS — E782 Mixed hyperlipidemia: Secondary | ICD-10-CM | POA: Insufficient documentation

## 2017-12-22 DIAGNOSIS — Z0000 Encounter for general adult medical examination without abnormal findings: Secondary | ICD-10-CM | POA: Insufficient documentation

## 2017-12-22 LAB — URINALYSIS, MICROSCOPIC

## 2017-12-22 LAB — URINALYSIS, MACROSCOPIC
BILIRUBIN: NEGATIVE mg/dL
BLOOD: NEGATIVE mg/dL
KETONES: NEGATIVE mg/dL
NITRITE: NEGATIVE
PROTEIN: NEGATIVE mg/dL

## 2017-12-22 LAB — COMPREHENSIVE METABOLIC PNL, FASTING
ALBUMIN: 4.5 g/dL (ref 3.2–4.6)
ALKALINE PHOSPHATASE: 42 U/L (ref 20–130)
ANION GAP: 6 mmol/L
AST (SGOT): 17 U/L (ref <=35)
BILIRUBIN TOTAL: 1.2 mg/dL (ref 0.3–1.2)
BUN/CREA RATIO: 13
BUN: 12 mg/dL (ref 10–25)
CALCIUM: 9.5 mg/dL (ref 8.8–10.3)
CHLORIDE: 101 mmol/L (ref 98–111)
CO2 TOTAL: 31 mmol/L (ref 21–35)
CREATININE: 0.91 mg/dL (ref <=1.30)
ESTIMATED GFR: 59 mL/min/1.73mˆ2 — ABNORMAL LOW
GLUCOSE: 92 mg/dL (ref 70–110)
POTASSIUM: 4.2 mmol/L (ref 3.5–5.0)
PROTEIN TOTAL: 6.9 g/dL (ref 6.0–8.3)
SODIUM: 138 mmol/L (ref 135–145)

## 2017-12-22 LAB — LIPID PANEL
CHOLESTEROL: 258 mg/dL — ABNORMAL HIGH (ref 0–199)
HDL CHOL: 83 mg/dL (ref >40)
HDL CHOL: 83 mg/dL (ref >40)
LDL DIRECT: 158 mg/dL — ABNORMAL HIGH (ref 0–99)
TRIGLYCERIDES: 92 mg/dL (ref 0–199)
VLDL CALC: 18 mg/dL (ref 0–50)

## 2017-12-22 LAB — CBC
HCT: 39.7 % (ref 34.6–46.2)
HGB: 13.6 g/dL (ref 11.8–15.8)
MCH: 31.4 pg (ref 27.6–33.2)
MCHC: 34.3 g/dL (ref 32.6–35.4)
MCV: 91.6 fL (ref 82.3–96.7)
MPV: 8.7 fL (ref 6.6–10.2)
PLATELETS: 198 10*3/uL (ref 140–440)
RBC: 4.33 x10ˆ6/uL (ref 3.80–5.24)
RDW: 13.8 % (ref 12.4–15.2)
WBC: 4.9 10*3/uL (ref 3.5–10.3)

## 2017-12-22 LAB — THYROXINE, FREE (FREE T4): THYROXINE (T4), FREE: 0.79 ng/dL (ref 0.61–1.12)

## 2017-12-22 MED ORDER — LEVOTHYROXINE 75 MCG TABLET
75.00 ug | ORAL_TABLET | Freq: Every morning | ORAL | 3 refills | Status: DC
Start: 2017-12-22 — End: 2017-12-23

## 2017-12-22 NOTE — Patient Instructions (Signed)
Medicare Preventive Services  Medicare coverage information Recommendation for YOU   Heart Disease and Diabetes   Lipid profile every 5 years or more often if at risk for cardiovascular disease  Last Lipid Panel  (Last result in the past 2 years)      Cholesterol   HDL   LDL   Direct LDL   Triglycerides      06/24/17 1006 173 62   98 102         Diabetes Screening  yearly for those at risk for diabetes, 2 tests per year for those with prediabetes Last Glucose: 96    Diabetes Self Management Training or Medical Nutrition Therapy  for those with diabetes, up to 10 hrs initial training within a year, subsequent years up to 2 hrs of follow up training Optional for those with diabetes     Medical Nutrition Therapy three hours of one-on-one counseling in first year, two hours in subsequent years Optional for those with diabetes, kidney disease   Intensive Behavioral Therapy for Obesity  Face-to-face counseling, first month every week, month 2-6 every other week, month 7-12 every month if continued progress is documented Optional for those with Body Mass Index 30 or higher  Your Body mass index is 15.8 kg/m.   Tobacco Cessation (Quitting) Counseling   two attempts per year, max 4 sessions per attempt, up to 8 per year, for those with tobacco-related health condition Optional for those that use tobacco   Cancer Screening   Colorectal screening   for anyone age 62 to 79 or any age if high risk:  . Screening Colonoscopy every 10 yrs if low risk,  more frequent if higher risk  OR  . Flexible  Sigmoidoscopy  every 5 yr OR  . Fecal Occult Blood Testing yearly OR  . Cologuard Stool DNA test once every 3 years OR  . CT Colonography every 5 yrs    See your schedule below   Screening Pap Test recommended every 3 years for all women age 76 to 68, or every five years if combined with HPV test (routine screening not needed after total hysterectomy).  Medicare covers every 2 years, up to yearly if high risk.  Screening Pelvic Exam  Medicare covers every 2 years, yearly if high risk or childbearing age with abnormal Pap in last 3 yrs. See your schedule below   Screening Mammogram   Recommended every 1-2 years for women age 38 to 73, and selectively recommended for women between 36-49 based on shared decisions about risk. Covered by Medicare up to every year for women age 30 or older See your schedule below   Lung Cancer Screening  Annual low dose computed tomography (LDCT scan) is recommended for those age 85-77 who smoked 30 pack-years and are current smokers or quit smoking within past 15 years (one pack-year= smoking one PPD for one year), after counseling by your doctor or nurse clinician about the possible benefits or harms See your schedule below   Vaccinations   Pneumococcal Vaccine recommended routinely age 14+ with two separate vaccines one year apart (Prevnar then Pneumovax).  Recommended before age 71 if medical conditions increase risk  Seasonal Influenza Vaccine once every flu season   Hepatitis B Vaccine 3 doses if risk (including anyone with diabetes or liver disease)  Shingles Vaccine Once or twice at age 87 or older  Diphtheria Tetanus Pertussis Vaccine ONCE as adult, booster every 10 years     There is no immunization history  on file for this patient.  Shingles vaccine and Diphtheria Tetanus Pertussis vaccines are available at pharmacies or local health department without a prescription.   Other Screening   Bone Densitometry   every 24 months for anyone at risk, including postmenopausal       Glaucoma Screening   yearly if in high risk group such as diabetes, family history, African American age 81+ or Hispanic American age 30+      Hepatitis C Screening recommended ONCE for those born between 1945-1965, or high risk for HCV infection     HIV Testing   yearly or up to 3 times in pregnancy     Abdominal Aortic Aneurysm Screening Ultrasound   once with Initial Welcome to Medicare Physical within 12 months of coverage if  65 +  with family history of AAA       Your Personalized Schedule for Preventive Tests   Health Maintenance: Pending and Last Completed       Date Due Completion Date    Depression Screening 03/16/1949 ---    Adult Tdap-Td (1 - Tdap) 03/16/1956 ---    Shingles Vaccine (1 of 2) 03/17/1987 ---    Pneumococcal 65+ Years Low Risk (1 of 2 - PCV13) 03/16/2002 ---    Osteoporosis screening 03/16/2002 ---    Annual Wellness Exam 07/22/2012 ---    Influenza Vaccine (Season Ended) 03/14/2018 ---

## 2017-12-22 NOTE — Progress Notes (Signed)
INTERNAL MEDICINE, POB  [address]  [place] [address]    Medicare Annual Wellness Visit    Name: [name] MRN:  [id number]   Date: 12/22/2017 Age: 83 y.o.       SUBJECTIVE:   [name] is a 83 y.o. female for presenting for Medicare Wellness exam.  Appetite been good.  Weight is up several lb.  Received positive reinforcement for that.  Remains physically mentally active.  No new surgeries.  No new medicines.  No new allergies to medicines.  Compliant with medications.    I have reviewed and reconciled the medication list with the patient today.    I have reviewed and updated as appropriate the past medical, family and social history. 12/22/2017 as summarized below:  Past Medical History:   Diagnosis Date   . Fibromyalgia    . Hypercholesterolemia      History reviewed. No pertinent surgical history.  Current Outpatient Medications   Medication Sig   . atorvastatin (LIPITOR) 10 mg Oral Tablet    . Ibuprofen (MOTRIN) 800 mg Oral Tablet Take 1 Tab (800 mg total) by mouth Three times a day as needed for Pain   . levothyroxine (SYNTHROID) 75 mcg Oral Tablet Take 1 Tab (75 mcg total) by mouth Every morning   . pravastatin (PRAVACHOL) 10 mg Oral Tablet Take 1 Tab (10 mg total) by mouth Every evening     Family Medical History:     Problem Relation (Age of Onset)    Hypertension (High Blood Pressure) Mother, Father            Social History     Socioeconomic History   . Marital status: Widowed     Spouse name: Not on file   . Number of children: Not on file   . Years of education: Not on file   . Highest education level: Not on file   Tobacco Use   . Smoking status: Never Smoker   . Smokeless tobacco: Never Used   Substance and Sexual Activity   . Drug use: Never   . Sexual activity: Not Currently   Lifestyle   . Physical activity:     Days per week: 3 days     Minutes per session: 30 min   . Stress: Not at all   Relationships   . Social connections:     Talks on phone: More than three times  a week     Gets together: More than three times a week     Attends religious service: More than 4 times per year     Active member of club or organization: Yes     Attends meetings of clubs or organizations: 1 to 4 times per year     Relationship status: Widowed   . Intimate partner violence:     Fear of current or ex partner: No     Emotionally abused: No     Physically abused: No     Forced sexual activity: No       REVIEW OF SYSTEMS:  CARDIAC:  No chest pain, DOE, or palpitations. No orthopnea or PND.    PULMONARY:  No cough, sputum, or hemoptysis. No fever,chills, or night sweats.    GI:  No nausea or vomiting. No abdominal pain. No change in bowel habits          No melena or bright red rectal bleeding.    GU :  Voiding without difficulty .  No dysuria hematuria    NEURO: No headache , diplopia or loss of function of limbs.     List of Current Health Care Providers   Care Team     PCP     Name Type Specialty Phone Number    Marylee Floras, MD Physician INTERNAL MEDICINE [phone]          Care Team     Name Type Specialty Phone Number    Marylee Floras, MD Physician INTERNAL MEDICINE [phone]                  Health Maintenance   Topic Date Due   . Depression Screening  03/16/1949   . Adult Tdap-Td (1 - Tdap) 03/16/1956   . Shingles Vaccine (1 of 2) 03/17/1987   . Pneumococcal 65+ Years Low Risk (1 of 2 - PCV13) 03/16/2002   . Osteoporosis screening  03/16/2002   . Annual Wellness Exam  07/22/2012   . Influenza Vaccine (Season Ended) 03/14/2018     Medicare Wellness Assessment   Medicare initial or wellness physical in the last year?: No  Advance Directives (optional)   Does patient have a living will or MPOA: no   Has patient provided [employer] with a copy?: no   Advance directive information given to the patient today?: no      Activities of Daily Living   Do you need help with dressing, bathing, or walking?: No   Do you need help with shopping, housekeeping, medications, or finances?: No     Do you have rugs in hallways, broken steps, or poor lighting?: No   Do you have grab bars in your bathroom, non-slip strips in your tub, and hand rails on your stairs?: No   Urinary Incontinence Screen (Women >=65 only)   Do you ever leak urine when you don't want to?: No   Cognitive Function Screen (1=Yes, 0=No)   What is you age?: Correct   What is the time to the nearest hour?: Correct   What is the year?: Correct   What is the name of this clinic?: Correct   Can the patient recognize two persons (the doctor, the nurse, home help, etc.)?: Correct   What is the date of your birth? (day and month sufficient) : Correct   In what year did World War II end?: Correct   Who is the current president of the [place]?: Correct   Count from 20 down to 1?: Correct   What address did I give you earlier?: Incorrect   Total Score: 9   Hearing Screen   Have you noticed any hearing difficulties?: Yes  After whispering 9-1-6 how many numbers did the patient repeat correctly?: 3   Fall Risk Screen   Do you feel unsteady when standing or walking?: No  Do you worry about falling?: No  Have you fallen in the past year?: No   Vision Screen   Right Eye = 20: 50   Left Eye = 20: 200   Depression Screen   Little interest or pleasure in doing things.: Not at all  Feeling down, depressed, or hopeless: Not at all  PHQ 2 Total: 0        OBJECTIVE:   Physical Exam:  BP 114/64   Pulse 67   Resp 16   Ht 1.575 m (5\' 2" )   Wt (!) 39.2 kg (86 lb 6.4 oz)   LMP  (LMP Unknown)   SpO2 96%  BMI 15.80 kg/m       General appearance: alert, oriented x 3, in her normal state, cooperative, not in apparent distress, appearing stated age   Neck: No lymphadenopathy or thyroid anomalies noted, carotids 2 + bilaterally without bruit  Lungs: clear to auscultation bilaterally. No crackles or wheeze noted. Normal respiratory effort  Heart: No JVD. Regular rate and rhythm, S1, S2 normal.  No gallop or rub. No murmur  Abdomen: soft, non-tender. Bowel  sounds normal. No hepatosplenomegaly.  Extremities: no cyanosis or edema.  Pulses intact.  No calf pain    Health Maintenance Due   Topic Date Due   . Depression Screening  03/16/1949   . Adult Tdap-Td (1 - Tdap) 03/16/1956   . Shingles Vaccine (1 of 2) 03/17/1987   . Pneumococcal 65+ Years Low Risk (1 of 2 - PCV13) 03/16/2002   . Osteoporosis screening  03/16/2002   . Annual Wellness Exam  07/22/2012       ASSESSMENT & PLAN:    1. Medicare annual wellness visit, initial  Completed    2. Acquired hypothyroidism  No signs or symptoms of higher low thyroid.  Check laboratory studies  - THYROID STIMULATING HORMONE (SENSITIVE TSH); Future  - Thyroxine Free (Free T4); Future  - THYROID STIMULATING HORMONE (SENSITIVE TSH)  - Thyroxine Free (Free T4)    3. Mixed hyperlipidemia  Check labs on medicines adjust lipid lowering agent is needed  - CBC; Future  - COMPREHENSIVE METABOLIC PNL, FASTING; Future  - URINALYSIS, MACROSCOPIC AND MICROSCOPIC; Future  - LIPID PANEL; Future  - CBC  - COMPREHENSIVE METABOLIC PNL, FASTING  - URINALYSIS, MACROSCOPIC AND MICROSCOPIC  - LIPID PANEL  - URINALYSIS, MACROSCOPIC  - URINALYSIS, MICROSCOPIC       Identified Risk Factors/ Recommended Actions   BMI addressed: Advised on importance of nutrition to increase below normal BMI.           Orders Placed This Encounter   . CBC   . COMPREHENSIVE METABOLIC PNL, FASTING   . URINALYSIS, MACROSCOPIC AND MICROSCOPIC   . LIPID PANEL   . THYROID STIMULATING HORMONE (SENSITIVE TSH)   . Thyroxine Free (Free T4)   . URINALYSIS, MACROSCOPIC   . URINALYSIS, MICROSCOPIC   . levothyroxine (SYNTHROID) 75 mcg Oral Tablet          The patient has been educated about risk factors and recommended preventive care. Written Prevention Plan completed/ updated and given to patient (see After Visit Summary).    Return in about 6 months (around 06/23/2018).    Marylee FlorasMichael T Ladell Bey, MD  [facility]  INTERNAL MEDICINE, POB  [address]  [place]  [phone]

## 2017-12-23 ENCOUNTER — Telehealth (HOSPITAL_BASED_OUTPATIENT_CLINIC_OR_DEPARTMENT_OTHER): Payer: Self-pay | Admitting: Internal Medicine

## 2017-12-23 ENCOUNTER — Other Ambulatory Visit: Payer: Self-pay | Admitting: Interventional Radiology

## 2017-12-23 ENCOUNTER — Telehealth: Payer: Self-pay | Admitting: Radiology

## 2017-12-23 DIAGNOSIS — N2889 Other specified disorders of kidney and ureter: Secondary | ICD-10-CM

## 2017-12-23 MED ORDER — LEVOTHYROXINE 88 MCG TABLET: 88 ug | Tab | Freq: Every morning | ORAL | 3 refills | 0 days | Status: DC

## 2017-12-23 NOTE — Progress Notes (Signed)
Notified Patient   . Ars

## 2017-12-23 NOTE — Telephone Encounter (Signed)
-----   Message from Marylee FlorasMichael T Angotti, MD sent at 12/23/2017  8:56 AM EDT -----  Please call patient and informed that all labs are within acceptable limits.  Thyroid pill however too small.  Increased to 88 mcg.  Dispense 30.  One p.o. Q.day.  Eleven refills.  Recheck TFTs in 4 weeks.  Marylee FlorasMichael T Angotti, MD

## 2017-12-23 NOTE — Telephone Encounter (Signed)
Patient reports that she had a CT scan 12/15/2017 at [facility].  Will have Dr. Laurence Ferrari review re:  IR follow up .  Olaoluwa Grieder [place], RN 12/23/2017 11:02 AM

## 2018-04-12 ENCOUNTER — Other Ambulatory Visit (HOSPITAL_BASED_OUTPATIENT_CLINIC_OR_DEPARTMENT_OTHER): Payer: Self-pay | Admitting: Internal Medicine

## 2018-04-12 MED ORDER — LEVOTHYROXINE 88 MCG TABLET
88.00 ug | ORAL_TABLET | Freq: Every morning | ORAL | 3 refills | Status: DC
Start: 2018-04-12 — End: 2018-07-01

## 2018-05-27 ENCOUNTER — Other Ambulatory Visit: Payer: Self-pay | Admitting: Interventional Radiology

## 2018-05-27 ENCOUNTER — Other Ambulatory Visit: Payer: Self-pay | Admitting: Radiology

## 2018-05-27 ENCOUNTER — Encounter: Payer: Self-pay | Admitting: Radiology

## 2018-05-27 DIAGNOSIS — N2889 Other specified disorders of kidney and ureter: Secondary | ICD-10-CM

## 2018-06-16 ENCOUNTER — Other Ambulatory Visit: Payer: Medicare Other

## 2018-06-22 DIAGNOSIS — R4182 Altered mental status, unspecified: Secondary | ICD-10-CM

## 2018-06-22 DIAGNOSIS — N390 Urinary tract infection, site not specified: Secondary | ICD-10-CM | POA: Diagnosis not present

## 2018-06-22 DIAGNOSIS — E039 Hypothyroidism, unspecified: Secondary | ICD-10-CM

## 2018-06-22 DIAGNOSIS — R509 Fever, unspecified: Secondary | ICD-10-CM

## 2018-06-22 DIAGNOSIS — I10 Essential (primary) hypertension: Secondary | ICD-10-CM

## 2018-06-23 ENCOUNTER — Encounter (HOSPITAL_BASED_OUTPATIENT_CLINIC_OR_DEPARTMENT_OTHER): Payer: Self-pay | Admitting: Internal Medicine

## 2018-06-23 DIAGNOSIS — I10 Essential (primary) hypertension: Secondary | ICD-10-CM | POA: Diagnosis not present

## 2018-06-23 DIAGNOSIS — R4182 Altered mental status, unspecified: Secondary | ICD-10-CM | POA: Diagnosis not present

## 2018-06-23 DIAGNOSIS — N390 Urinary tract infection, site not specified: Secondary | ICD-10-CM | POA: Diagnosis not present

## 2018-06-23 DIAGNOSIS — R509 Fever, unspecified: Secondary | ICD-10-CM | POA: Diagnosis not present

## 2018-06-24 DIAGNOSIS — R4182 Altered mental status, unspecified: Secondary | ICD-10-CM | POA: Diagnosis not present

## 2018-06-24 DIAGNOSIS — N390 Urinary tract infection, site not specified: Secondary | ICD-10-CM | POA: Diagnosis not present

## 2018-06-24 DIAGNOSIS — R509 Fever, unspecified: Secondary | ICD-10-CM | POA: Diagnosis not present

## 2018-06-24 DIAGNOSIS — I10 Essential (primary) hypertension: Secondary | ICD-10-CM | POA: Diagnosis not present

## 2018-06-25 DIAGNOSIS — I10 Essential (primary) hypertension: Secondary | ICD-10-CM | POA: Diagnosis not present

## 2018-06-25 DIAGNOSIS — R4182 Altered mental status, unspecified: Secondary | ICD-10-CM | POA: Diagnosis not present

## 2018-06-25 DIAGNOSIS — N390 Urinary tract infection, site not specified: Secondary | ICD-10-CM | POA: Diagnosis not present

## 2018-06-25 DIAGNOSIS — R509 Fever, unspecified: Secondary | ICD-10-CM | POA: Diagnosis not present

## 2018-06-30 ENCOUNTER — Ambulatory Visit: Payer: Medicare Other | Attending: Internal Medicine | Admitting: Internal Medicine

## 2018-06-30 ENCOUNTER — Encounter (HOSPITAL_BASED_OUTPATIENT_CLINIC_OR_DEPARTMENT_OTHER): Payer: Self-pay | Admitting: Internal Medicine

## 2018-06-30 VITALS — BP 98/62 | HR 77 | Temp 97.0°F | Resp 18 | Ht 64.0 in | Wt 84.6 lb

## 2018-06-30 DIAGNOSIS — E782 Mixed hyperlipidemia: Secondary | ICD-10-CM | POA: Insufficient documentation

## 2018-06-30 DIAGNOSIS — R634 Abnormal weight loss: Secondary | ICD-10-CM | POA: Insufficient documentation

## 2018-06-30 DIAGNOSIS — E039 Hypothyroidism, unspecified: Principal | ICD-10-CM | POA: Insufficient documentation

## 2018-06-30 LAB — URINALYSIS, MACROSCOPIC
BILIRUBIN: NEGATIVE mg/dL
BLOOD: NEGATIVE mg/dL
GLUCOSE: NEGATIVE mg/dL
KETONES: NEGATIVE mg/dL
NITRITE: NEGATIVE
PH: 7 (ref 5.0–7.0)
PROTEIN: NEGATIVE mg/dL
SPECIFIC GRAVITY: 1.008 — ABNORMAL LOW (ref 1.010–1.025)
UROBILINOGEN: 1 mg/dL

## 2018-06-30 LAB — THYROID STIMULATING HORMONE (SENSITIVE TSH): TSH: 0.018 u[IU]/mL — ABNORMAL LOW (ref 0.450–5.330)

## 2018-06-30 LAB — COMPREHENSIVE METABOLIC PNL, FASTING
ALBUMIN: 4.5 g/dL (ref 3.2–4.6)
ALKALINE PHOSPHATASE: 59 U/L (ref 20–130)
ALT (SGPT): 9 U/L (ref <=52)
ANION GAP: 5 mmol/L
AST (SGOT): 16 U/L (ref <=35)
BILIRUBIN TOTAL: 1.1 mg/dL (ref 0.3–1.2)
BILIRUBIN TOTAL: 1.1 mg/dL (ref 0.3–1.2)
BUN/CREA RATIO: 19
BUN: 13 mg/dL (ref 10–25)
CALCIUM: 9.5 mg/dL (ref 8.8–10.3)
CHLORIDE: 102 mmol/L (ref 98–111)
CO2 TOTAL: 31 mmol/L (ref 21–35)
CREATININE: 0.7 mg/dL (ref <=1.30)
ESTIMATED GFR: >60 mL/min/1.73mˆ2
GLUCOSE: 95 mg/dL (ref 70–110)
POTASSIUM: 4 mmol/L (ref 3.5–5.0)
PROTEIN TOTAL: 6.5 g/dL (ref 6.0–8.3)
SODIUM: 138 mmol/L (ref 135–145)

## 2018-06-30 LAB — CBC
HCT: 40.4 % (ref 34.6–46.2)
HGB: 13.9 g/dL (ref 11.8–15.8)
MCH: 30.3 pg (ref 27.6–33.2)
MCHC: 34.3 g/dL (ref 32.6–35.4)
MCV: 88.4 fL (ref 82.3–96.7)
MPV: 8.7 fL (ref 6.6–10.2)
PLATELETS: 237 x10ˆ3/uL (ref 140–440)
RBC: 4.57 x10ˆ6/uL (ref 3.80–5.24)
RDW: 13.2 % (ref 12.4–15.2)
WBC: 7.3 x10ˆ3/uL (ref 3.5–10.3)

## 2018-06-30 LAB — URINALYSIS, MICROSCOPIC

## 2018-06-30 LAB — THYROXINE, FREE (FREE T4): THYROXINE (T4), FREE: 1.75 ng/dL — ABNORMAL HIGH (ref 0.61–1.12)

## 2018-06-30 LAB — LIPID PANEL
CHOLESTEROL: 217 mg/dL — ABNORMAL HIGH (ref 0–199)
HDL CHOL: 69 mg/dL (ref >40)
LDL DIRECT: 138 mg/dL — ABNORMAL HIGH (ref 0–99)
TRIGLYCERIDES: 76 mg/dL (ref 0–199)
VLDL CALC: 15 mg/dL (ref 0–50)

## 2018-06-30 NOTE — Nursing Note (Signed)
Venipuncture performed in clinic L arm.

## 2018-06-30 NOTE — Progress Notes (Signed)
[facility]   [address] [place] [address]  [place] [id number]  Dept: [phone]  Dept Fax: [phone]    Denzil HughesBarbara L Saephan  08/02/1934  [id number]    Date of Service: 06/30/2018  9:30 AM EST    Chief complaint:   Chief Complaint   Patient presents with   . Follow Up 6 Months     Acquired hypothyroidism       HPI:     This is a case of a 83 y.o. year old female who comes in today for six-month follow-up.  Appetite been fair.  Weight is been stable.  Counseled on increasing caloric intake.  No new surgeries.  No new medicines.  No new allergies to medicines.  No signs or symptoms of higher low thyroid.    ROS:    CARDIAC:  No chest pain, DOE, or palpitations. No orthopnea or PND.    PULMONARY:  No cough, sputum, or hemoptysis. No fever,chills, or night sweats.    GI:  No nausea or vomiting. No abdominal pain. No change in bowel habits          No melena or bright red rectal bleeding.    GU :  Voiding without difficulty     NEURO: No headache , diplopia or loss of function of limbs.    Current Outpatient Medications   Medication Sig   . atorvastatin (LIPITOR) 10 mg Oral Tablet    . Ibuprofen (MOTRIN) 800 mg Oral Tablet Take 1 Tab (800 mg total) by mouth Three times a day as needed for Pain   . levothyroxine (SYNTHROID) 88 mcg Oral Tablet Take 1 Tab (88 mcg total) by mouth Every morning   . pravastatin (PRAVACHOL) 10 mg Oral Tablet Take 1 Tab (10 mg total) by mouth Every evening       Objective:     BP 98/62 (Site: Right, Patient Position: Sitting, Cuff Size: Adult)   Pulse 77   Temp 36.1 C (97 F) (Oral)   Resp 18   Ht 1.626 m (5\' 4" )   Wt (!) 38.4 kg (84 lb 9.6 oz)   LMP  (LMP Unknown)   SpO2 94%   BMI 14.52 kg/m       General appearance: alert, oriented x 3, in her normal state, cooperative, not in apparent distress, appearing stated age   Neck: No lymphadenopathy or thyroid anomalies noted, carotids 2 + bilaterally without bruit  Lungs: clear to auscultation  bilaterally. No crackles or wheeze noted. Normal respiratory effort  Heart: No JVD. Regular rate and rhythm, S1, S2 normal.  No gallop or rub. No murmur  Abdomen: soft, non-tender. Bowel sounds normal. No hepatosplenomegaly.  Extremities: no cyanosis or edema.  Pulses intact.  No calf pain    Assessment/Plan     ENCOUNTER DIAGNOSES     ICD-10-CM   1. Acquired hypothyroidism E03.9   2. Mixed hyperlipidemia E78.2   3. Weight loss R63.4                 Orders Placed This Encounter   . Thyroxine Free (Free T4)   . THYROID STIMULATING HORMONE (SENSITIVE TSH)   . CBC   . COMPREHENSIVE METABOLIC PNL, FASTING   . URINALYSIS, MACROSCOPIC AND MICROSCOPIC   . LIPID PANEL         The patient was given ample opportunity to ask questions and those questions were answered to the patient's satisfaction. The patient was encouraged to be  involved in their own care, and all diagnoses, medications, and medication side-effects were discussed.  A copy of the patient's medication list was printed and given to the patient. A good faith effort was made to reconcile the patient's medications.  The patient was told to contact me with any additional questions or concerns, or go to the ED in an emergency.     Follow up: Return in about 6 months (around 12/30/2018).    This note was partially generated using MModal Fluency Direct system, and there may be some incorrect words, spellings, and punctuation that were not noted in checking the note before saving.    Harrie Foreman, M.D.    [facility] - Internal Medicine   [address] [place] [address] [address]  Dept: [phone]  Dept Fax: [phone]

## 2018-07-01 ENCOUNTER — Other Ambulatory Visit (HOSPITAL_BASED_OUTPATIENT_CLINIC_OR_DEPARTMENT_OTHER): Payer: Self-pay | Admitting: Internal Medicine

## 2018-07-01 DIAGNOSIS — E0580 Other thyrotoxicosis without thyrotoxic crisis or storm: Secondary | ICD-10-CM

## 2018-07-01 MED ORDER — LEVOTHYROXINE 75 MCG TABLET
75.00 ug | ORAL_TABLET | Freq: Every morning | ORAL | 4 refills | Status: DC
Start: 2018-07-01 — End: 2018-12-27

## 2018-07-01 NOTE — Telephone Encounter (Signed)
-----   Message from Marylee FlorasMichael T Angotti, MD sent at 07/01/2018  8:44 AM EST -----  Please call patient and informed that all labs are within acceptable limits.  Synthroid however needs to be slightly decreased.  Decrease to 75 mcg.  Dispense 30.  One p.o. Q.day.  Eleven refills.  Recheck free T4 and TSH in 4 weeks  Marylee FlorasMichael T Angotti, MD

## 2018-07-06 ENCOUNTER — Emergency Department
Admission: EM | Admit: 2018-07-06 | Discharge: 2018-07-06 | Disposition: A | Discharge status: Home / Self Care | Payer: Medicare Other

## 2018-07-06 ENCOUNTER — Emergency Department (HOSPITAL_COMMUNITY): Payer: Medicare Other

## 2018-07-06 DIAGNOSIS — N390 Urinary tract infection, site not specified: Secondary | ICD-10-CM

## 2018-07-06 DIAGNOSIS — R140 Abdominal distension (gaseous): Secondary | ICD-10-CM

## 2018-07-06 DIAGNOSIS — R109 Unspecified abdominal pain: Secondary | ICD-10-CM

## 2018-07-06 LAB — HEPATIC FUNCTION PANEL
ALBUMIN: 4.5 g/dL (ref 3.2–4.6)
ALKALINE PHOSPHATASE: 57 U/L (ref 20–130)
ALT (SGPT): 8 U/L (ref <=52)
AST (SGOT): 14 U/L (ref <=35)
BILIRUBIN DIRECT: 0.2 mg/dL (ref <=0.3)
BILIRUBIN TOTAL: 0.9 mg/dL (ref 0.3–1.2)
PROTEIN TOTAL: 7.1 g/dL (ref 6.0–8.3)

## 2018-07-06 LAB — BASIC METABOLIC PANEL
ANION GAP: 7 mmol/L
BUN/CREA RATIO: 12
BUN: 8 mg/dL — ABNORMAL LOW (ref 10–25)
CALCIUM: 9.7 mg/dL (ref 8.8–10.3)
CHLORIDE: 101 mmol/L (ref 98–111)
CO2 TOTAL: 30 mmol/L (ref 21–35)
CREATININE: 0.69 mg/dL (ref <=1.30)
ESTIMATED GFR: >60 mL/min/1.73mˆ2
GLUCOSE: 100 mg/dL (ref 70–110)
POTASSIUM: 3.7 mmol/L (ref 3.5–5.0)
SODIUM: 138 mmol/L (ref 135–145)

## 2018-07-06 LAB — URINALYSIS, MACRO/MICRO
BILIRUBIN: NEGATIVE mg/dL
BLOOD: NEGATIVE mg/dL
GLUCOSE: NEGATIVE mg/dL
KETONES: NEGATIVE mg/dL
NITRITE: NEGATIVE
PH: 7 (ref 5.0–7.0)
PROTEIN: NEGATIVE mg/dL
SPECIFIC GRAVITY: 1.007 — ABNORMAL LOW (ref 1.010–1.025)
UROBILINOGEN: 1 mg/dL

## 2018-07-06 LAB — CBC WITH DIFF
BASOPHIL #: 0.1 x10ˆ3/uL (ref 0.00–0.20)
BASOPHIL %: 1 %
EOSINOPHIL #: 0.3 x10ˆ3/uL (ref 0.00–0.50)
EOSINOPHIL %: 3 %
HCT: 40.2 % (ref 34.6–46.2)
HGB: 13.9 g/dL (ref 11.8–15.8)
LYMPHOCYTE #: 2.9 x10ˆ3/uL (ref 0.90–3.40)
LYMPHOCYTE %: 33 %
MCH: 30.1 pg (ref 27.6–33.2)
MCHC: 34.5 g/dL (ref 32.6–35.4)
MCV: 87 fL (ref 82.3–96.7)
MONOCYTE #: 0.9 x10ˆ3/uL (ref 0.20–0.90)
MONOCYTE %: 10 %
MPV: 8.3 fL (ref 6.6–10.2)
NEUTROPHIL #: 4.9 x10ˆ3/uL (ref 1.50–6.40)
NEUTROPHIL %: 54 %
PLATELETS: 230 x10ˆ3/uL (ref 140–440)
RBC: 4.62 x10ˆ6/uL (ref 3.80–5.24)
RDW: 13.2 % (ref 12.4–15.2)
WBC: 9 x10ˆ3/uL (ref 3.5–10.3)

## 2018-07-06 LAB — LIPASE: LIPASE: 27 U/L (ref 0–82)

## 2018-07-06 LAB — PT/INR: INR: 1.04 (ref 0.80–1.10)

## 2018-07-06 MED ORDER — CEPHALEXIN 500 MG CAPSULE
500.00 mg | ORAL_CAPSULE | Freq: Three times a day (TID) | ORAL | 0 refills | Status: AC
Start: 2018-07-06 — End: 2018-07-13

## 2018-07-06 MED ORDER — CEFEPIME 1G IN NS 50ML MINIBAG IVPB
1.00 g | INTRAMUSCULAR | Status: AC
Start: 2018-07-06 — End: 2018-07-06
  Administered 2018-07-06: 0 g via INTRAVENOUS
  Administered 2018-07-06: 1 g via INTRAVENOUS
  Filled 2018-07-06: qty 50

## 2018-07-06 MED ORDER — PANTOPRAZOLE 40 MG INTRAVENOUS SOLUTION
40.0000 mg | Freq: Every day | INTRAVENOUS | Status: DC
Start: 2018-07-06 — End: 2018-07-07
  Administered 2018-07-06: 40 mg via INTRAVENOUS
  Filled 2018-07-06: qty 10

## 2018-07-06 MED ORDER — PANTOPRAZOLE 40 MG TABLET,DELAYED RELEASE
40.0000 mg | DELAYED_RELEASE_TABLET | Freq: Every day | ORAL | 0 refills | Status: AC
Start: 2018-07-06 — End: 2018-08-05

## 2018-07-06 MED ORDER — PANTOPRAZOLE 40 MG INTRAVENOUS SOLUTION
40.0000 mg | Freq: Every day | INTRAVENOUS | Status: DC
Start: 2018-07-07 — End: 2018-07-06

## 2018-07-06 MED ORDER — ALUMINUM-MAG HYDROXIDE-SIMETHICONE 200 MG-200 MG-20 MG/5 ML ORAL SUSP
30.00 mL | ORAL | Status: AC
Start: 2018-07-06 — End: 2018-07-06
  Administered 2018-07-06: 30 mL via ORAL
  Filled 2018-07-06: qty 30

## 2018-07-06 NOTE — Discharge Instructions (Signed)
Take antibiotic and acid reducer medication as directed  Follow-up with PCP if symptoms persist  Any new, persistent symptoms or concerns return to ER for evaluation

## 2018-07-06 NOTE — ED Provider Notes (Signed)
Department of Emergency Medicine           DOS: 07/06/18      Chief Complaint:  Patient presents with     Chief Complaint   Patient presents with   . Abdominal Pain     Pt presents to ED with c/o abd pain and nausea x3 days.   . Nausea       HPI    [name] is a 83 y.o. female who presents to the Emergency Department via POV for evaluation of abdominal pain x3 days. Pt reports the pain has gotten worse and radiates into her epigastric area. Pt reports nausea. She states after eating she get bloated. Pt notes she took 2 "gas pills" yesterday with no relief. Pt denies vomiting, diarrhea, and recent weight loss. PMH of fibromyalgia and hypercholesterolemia.       Past Medical History:  Diagnosis     Past Medical History:   Diagnosis Date   . Fibromyalgia    . Hypercholesterolemia        Past Surgical History:  History reviewed. No pertinent surgical history.    Family History:   Family History   Problem Relation Age of Onset   . Hypertension (High Blood Pressure) Mother    . Hypertension (High Blood Pressure) Father        Social History     Social History     Tobacco Use   . Smoking status: Never Smoker   . Smokeless tobacco: Never Used   Substance Use Topics   . Alcohol use: Not on file   . Drug use: Never       Medications:  Medications Prior to Admission     Prescriptions    atorvastatin (LIPITOR) 10 mg Oral Tablet    Ibuprofen (MOTRIN) 800 mg Oral Tablet    Take 1 Tab (800 mg total) by mouth Three times a day as needed for Pain    levothyroxine (SYNTHROID) 75 mcg Oral Tablet    Take 1 Tab (75 mcg total) by mouth Every morning    pravastatin (PRAVACHOL) 10 mg Oral Tablet    Take 1 Tab (10 mg total) by mouth Every evening          Allergy:  Allergies   Allergen Reactions   . Grass Pollen        Review of Systems  Review of Systems   Constitutional: Negative for chills and fever.   HENT: Negative for congestion and sore throat.    Eyes: Negative for discharge and visual disturbance.   Respiratory:  Negative for cough, shortness of breath and wheezing.    Cardiovascular: Negative for chest pain and leg swelling.   Gastrointestinal: Positive for abdominal pain. Negative for constipation, diarrhea, nausea and vomiting.        Abdominal bloating    Endocrine: Negative for polydipsia and polyuria.   Genitourinary: Negative for dysuria, hematuria, vaginal bleeding and vaginal discharge.   Musculoskeletal: Negative for back pain and joint swelling.   Skin: Negative for pallor and rash.   Neurological: Negative for dizziness, light-headedness and headaches.   Psychiatric/Behavioral: Negative for hallucinations and suicidal ideas.   All other systems reviewed and are negative.      Vitals:  Filed Vitals:    07/06/18 1955 07/06/18 2030 07/06/18 2132   BP: (!) 160/84 (!) 145/57 125/68   Pulse: 84 89 80   Resp: 18 17 16    Temp: 36.1 C (97 F)     SpO2:  98% 97% 98%       PHYSICAL EXAM:    Vital signs reviewed.  Nursing note reviewed.  Constitutional: alert, no acute distress, no obvious discomfort, frail   HEENT:      Head: Normocephalic and atraumatic.       Eyes: Conjunctivae are normal. Pupils are equal, round, and reactive to light.       Mouth: moist mucous membranes. No pharyngeal erythema  Neck: Normal range of motion. Neck supple. No lymphadenopathy  Cardiovascular: Normal rate, regular rhythm. (+)S1/S2,  No murmurs, gallops or rubs  Pulmonary/Chest: Clear to auscultate bilaterally, no wheezing rales or rhonchi.  Abdominal: Soft. Bowel sounds are normal. Diffuse tenderness. Nondistended. No rebound or guarding  Upper Extremity: normal to inspection, (+) radial pulses  Lower Extremity: normal to inspection, no clubbing, cyanosis or edema, (+) pedal pulses bilateral  Neurological: alert and oriented to person, place, and time. No focal motor or sensory deficits. Gait is normal.   Skin:   Warm, dry, well perfused, no rash  Psychiatric:  normal mood and affect.     Old records reviewed by me:  Diagnostics:  Labs:       Results for orders placed or performed during the hospital encounter of 07/06/18   URINE CULTURE,ROUTINE   Result Value Ref Range    URINE CULTURE No Growth 2 Days    BASIC METABOLIC PANEL   Result Value Ref Range    SODIUM 138 135 - 145 mmol/L    POTASSIUM 3.7 3.5 - 5.0 mmol/L    CHLORIDE 101 98 - 111 mmol/L    CO2 TOTAL 30 21 - 35 mmol/L    ANION GAP 7 mmol/L    CALCIUM 9.7 8.8 - 10.3 mg/dL    GLUCOSE 161100 70 - 096110 mg/dL    BUN 8 (L) 10 - 25 mg/dL    CREATININE 0.450.69 <=4.09<=1.30 mg/dL    BUN/CREA RATIO 12     ESTIMATED GFR >60 Avg: 75 mL/min/1.3223m^2   HEPATIC FUNCTION PANEL   Result Value Ref Range    ALBUMIN 4.5 3.2 - 4.6 g/dL    ALKALINE PHOSPHATASE 57 20 - 130 U/L    ALT (SGPT) 8 <=52 U/L    AST (SGOT) 14 <=35 U/L    BILIRUBIN TOTAL 0.9 0.3 - 1.2 mg/dL    BILIRUBIN DIRECT 0.2 <=0.3 mg/dL    PROTEIN TOTAL 7.1 6.0 - 8.3 g/dL   PT/INR   Result Value Ref Range    INR 1.04 0.80 - 1.10   LIPASE   Result Value Ref Range    LIPASE 27 0 - 82 U/L   CBC WITH DIFF   Result Value Ref Range    WBC 9.0 3.5 - 10.3 x10^3/uL    RBC 4.62 3.80 - 5.24 x10^6/uL    HGB 13.9 11.8 - 15.8 g/dL    HCT 81.140.2 91.434.6 - 78.246.2 %    MCV 87.0 82.3 - 96.7 fL    MCH 30.1 27.6 - 33.2 pg    MCHC 34.5 32.6 - 35.4 g/dL    RDW 95.613.2 21.312.4 - 08.615.2 %    PLATELETS 230 140 - 440 x10^3/uL    MPV 8.3 6.6 - 10.2 fL    NEUTROPHIL % 54 %    LYMPHOCYTE % 33 %    MONOCYTE % 10 %    EOSINOPHIL % 3 %    BASOPHIL % 1 %    NEUTROPHIL # 4.90 1.50 - 6.40 x10^3/uL    LYMPHOCYTE #  2.90 0.90 - 3.40 x10^3/uL    MONOCYTE # 0.90 0.20 - 0.90 x10^3/uL    EOSINOPHIL # 0.30 0.00 - 0.50 x10^3/uL    BASOPHIL # 0.10 0.00 - 0.20 x10^3/uL   URINALYSIS, MACRO/MICRO   Result Value Ref Range    COLOR Yellow Yellow, Straw, Colorless    APPEARANCE Clear Clear, Cloudy    SPECIFIC GRAVITY 1.007 (L) 1.010 - 1.025    PH 7.0 5.0 - 7.0    LEUKOCYTES Small (A) Negative WBCs/uL    NITRITE Negative Negative    PROTEIN Negative Negative, Trace mg/dL    GLUCOSE Negative Negative mg/dL    KETONES Negative  Negative mg/dL    UROBILINOGEN 1.0 Normal, 0.2 , 1.0 mg/dL    BILIRUBIN Negative Negative mg/dL    BLOOD Negative Negative mg/dL    RBCS 0-2 (A) None /hpf    WBCS 10-20 (A) None /hpf    BACTERIA None None /hpf    SQUAMOUS EPITHELIAL None None, Rare, Occasional /hpf    HYALINE CASTS Rare None, Rare, Few, Occasional, Many /lpf     Labs reviewed and interpreted by me.    Radiology:  Results for orders placed or performed during the hospital encounter of 07/06/18   CT ABDOMEN PELVIS WO IV CONTRAST     Status: None    Narrative    Darilyn L Renton    PROCEDURE DESCRIPTION: CT ABDOMEN PELVIS WO IV CONTRAST    PROCEDURE PERFORMED DATE AND TIME: 07/06/2018 8:33 PM     CT DOSE INFORMATION:  DLP (mGycm):  236.90 mGycm    CLINICAL INDICATION: mid to upper abodminal pain    The CT exam was performed using one or more of the following dose reduction  techniques: automated exposure control, adjustment of the mA and/or kV  according to the patient's size, or use of iterative reconstruction  technique.    Previous CT abdomen/pelvis was performed on 07/15/17.    Severe levoscoliosis is noted of the lumbar spine with multilevel  degenerative changes. Gallbladder is contracted and appears grossly normal.  Kidneys are normal without evidence of stone or hydronephrosis. Ureters and  bladder are unremarkable. No bowel wall thickening or inflammation is seen.  There is no evidence of an abnormal appendix. Evaluation is limited by lack  of contrast. No gross abnormality is noted of liver, pancreas, spleen,  adrenals, abdominal aorta. There are few small rounded calcifications in  the right upper quadrant posteriorly which are likely incidental and are  unchanged since 2015.      Impression    1. 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  2. No acute abnormality. Evaluation is limited by lack of contrast.        Radiologist location ID: [id number]       Radiology images and final radiologist report reviewed by  me.    Medical Decision Making:    Plan: The following tests, imaging and medications will be ordered to investigate/rule out and treat the medical conditions to the best of our ability while pt is in the emergency department.  Vitals will be monitored and pt will be rechecked in the ED.    Orders Placed This Encounter   . CANCELED: URINE CULTURE,ROUTINE   . CT ABDOMEN PELVIS WO IV CONTRAST   . BASIC METABOLIC PANEL   . CBC/DIFF   . HEPATIC FUNCTION PANEL   . PT/INR   . LIPASE   . URINALYSIS WITH REFLEX MICROSCOPIC AND CULTURE IF POSITIVE   .  CBC WITH DIFF   . URINALYSIS, MACRO/MICRO   . ceFEPime (MAXIPIME) 1g in NS 50mL IVPB minibag   . aluminum-magnesium hydroxide-simethicone (MAG-AL PLUS) 200-200-20 mg per 5 mL oral liquid   . cephalexin (KEFLEX) 500 mg Oral Capsule   . pantoprazole (PROTONIX) 40 mg Oral Tablet, Delayed Release (E.C.)       ED Course  ED Recheck: Patient doing well. Vitals stable.   Results and plan reviewed with patient.  Patient agrees with discharge, treatment plan and outpatient follow-up.  Patient was given prescription for Keflex, Protonix at time of discharge.  Patient was instructed return to ER if any concerns, new or worsening symptoms.  Prior to discharge patient was given opportunity to ask questions.    Disposition: Discharged      Encounter Diagnoses   Name Primary?   . UTI (urinary tract infection) Yes   . Abdominal pain, unspecified abdominal location    . Bloating         I am scribing for, and in the presence of, Dr. Rosalita Chessman for services provided on 07/06/2018.  Marc Morgans, SCRIBE   Marc Morgans, SCRIBE  07/06/2018, 19:59    I personally performed the services described in this documentation, as scribed  in my presence, and it is both accurate  and complete.    Virgel Manifold, DO  Dorr, DO  07/17/2018, 62:37

## 2018-07-07 ENCOUNTER — Telehealth (HOSPITAL_COMMUNITY): Payer: Self-pay | Admitting: Internal Medicine

## 2018-07-07 NOTE — Telephone Encounter (Signed)
Chart reviewed prior to patient contact.  Follow-up call placed to patient in regards to ED discharge on 07/06/2018.   Following interventions were completed:  No answer - unable to leave message; no voice mail available. Brent Bullaarolyn Tichnell, RN  07/07/2018, 15:26

## 2018-07-09 LAB — URINE CULTURE,ROUTINE: URINE CULTURE: NO GROWTH

## 2018-07-17 ENCOUNTER — Encounter (HOSPITAL_COMMUNITY): Payer: Self-pay

## 2018-07-27 ENCOUNTER — Ambulatory Visit
Admission: RE | Admit: 2018-07-27 | Discharge: 2018-07-27 | Disposition: A | Discharge status: Home / Self Care | Payer: Medicare Other | Source: Ambulatory Visit | Attending: Interventional Radiology | Admitting: Interventional Radiology

## 2018-07-27 ENCOUNTER — Encounter: Payer: Self-pay | Admitting: *Deleted

## 2018-07-27 DIAGNOSIS — N2889 Other specified disorders of kidney and ureter: Secondary | ICD-10-CM

## 2018-07-27 HISTORY — PX: IR RADIOLOGIST EVAL & MGMT: IMG5224

## 2018-07-27 NOTE — Progress Notes (Signed)
Chief Complaint: Patient was seen in consultation today for  Chief Complaint  Patient presents with  . Follow-up    Surveillance of Right Renal Mass      Referring Physician(s): Dr. Nila Nephew  History of Present Illness: Linda Sanders is a 84 y.o. 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 On routine surveillance imaging she was noted to have slight interval enlargement of a minimally enhancing lesion within the right kidney between May 2017 and June 2018. 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  We have been seeing her for a couple years now and have discussed a few options including observation, biopsy, and treatment. Given that the lesion has been very slow growing without complicating features, she chose observation. She returns today after having follow up MRI. She has been pretty well. PMHx and meds updated She denies hematuria, abd pain, flank pain, wt changes.  Past Medical History:  Diagnosis Date  . 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 Hinton Rao.  s/p partial mastectomy, left breast xrt, chemo  . Allergic rhinitis   . Arthritis    "joints; right hip" (12/28/2015"  . Blurry vision 06/11/2015  . Chronic bronchitis ([place])   . Diarrhea 06/11/2015  . Ear fullness 06/11/2015  . GERD (gastroesophageal reflux disease)   . Hypertension   . Hypothyroidism   . Multiple allergies 03/29/2015  . 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 Nausea without vomiting 06/11/2015  . Pneumonia "several times"  . Polymyositis ([place])    Dr. Justine Null  . Pulmonary nodule   . Skull lesion 03/29/2015    Past Surgical History:  Procedure Laterality Date  . APPENDECTOMY    . BREAST BIOPSY Left   . CATARACT EXTRACTION W/ INTRAOCULAR LENS  IMPLANT, BILATERAL  Bilateral   . DILATION AND CURETTAGE OF UTERUS    . IR RADIOLOGIST EVAL & MGMT  12/30/2016  . IR RADIOLOGIST EVAL & MGMT  06/30/2017  . LAPAROSCOPIC CHOLECYSTECTOMY    . MASTECTOMY, PARTIAL Left   . NASAL SINUS SURGERY    . POSTERIOR LUMBAR FUSION  10/2015   "have screws and mesh"  . TUBAL LIGATION    . VAGINAL HYSTERECTOMY      Allergies: Augmentin [amoxicillin-pot clavulanate]; Avelox [moxifloxacin hcl in nacl]; Clarithromycin; Codeine; Cymbalta [duloxetine hcl]; Metronidazole and related; Moxifloxacin; Amoxicillin-pot clavulanate; and Clarithromycin  Medications: Prior to Admission medications   Medication Sig Start Date End Date Taking? Authorizing Provider  acetaminophen (TYLENOL) 325 MG tablet Take 650 mg by mouth every 6 (six) hours as needed for mild pain.    Yes [provider]  calcium carbonate (TUMS - DOSED IN MG ELEMENTAL CALCIUM) 500 MG chewable tablet Chew 1 tablet by mouth daily as needed for indigestion or heartburn.   Yes [provider]  carvedilol (COREG) 12.5 MG tablet Take 12.5 mg by mouth 2 (two) times daily. 04/12/14  Yes [provider]  Cholecalciferol (VITAMIN D3) 50000 UNITS CAPS Take 1 capsule by mouth once a week. No specific day   Yes [provider]  famotidine-calcium carbonate-magnesium hydroxide (PEPCID COMPLETE) 10-800-165 MG chewable tablet Chew 1 tablet by mouth daily.    Yes [provider]  gabapentin (NEURONTIN) 400 MG capsule Take 400 mg by mouth 3 (three) times daily.  08/19/10  Yes [provider]  levothyroxine (SYNTHROID, LEVOTHROID) 50 MCG tablet Take 1.5 tablets (75 mcg total) by mouth daily. 12/30/15  Yes Dhungel, Nishant, MD  Polyethyl Glycol-Propyl Glycol (SYSTANE FREE OP) Place 1 drop into both eyes daily.   Yes [provider]  predniSONE (DELTASONE) 5 MG tablet Take 3 mg by mouth daily.    Yes [provider]  traMADol (ULTRAM) 50 MG tablet Take 50 mg by mouth 3 (three)  times daily. Taking every day per patient 08/19/10  Yes [provider]  carbamide peroxide (DEBROX) 6.5 % otic solution Place 5 drops into both ears 2 (two) times daily. Patient not taking: Reported on 06/30/2017 06/11/15   Tommy Medal, Lavell Islam, MD  guaiFENesin (MUCINEX) 600 MG 12 hr tablet Take 1 tablet (600 mg total) by mouth 2 (two) times daily. Patient not taking: Reported on 06/30/2017 12/30/15   Dhungel, Flonnie Overman, MD  guaiFENesin-dextromethorphan (ROBITUSSIN DM) 100-10 MG/5ML syrup Take 5 mLs by mouth every 4 (four) hours as needed for cough. Patient not taking: Reported on 06/30/2017 12/30/15   Dhungel, Flonnie Overman, MD  mirtazapine (REMERON) 30 MG tablet Take 15 mg by mouth at bedtime.    [provider]  Misc. Devices (ACAPELLA) MISC 1 application by Does not apply route 3 (three) times daily. Patient not taking: Reported on 12/30/2016 12/30/15   Dhungel, Flonnie Overman, MD  Multiple Vitamin (MULTIVITAMIN) capsule Take 1 capsule by mouth daily.      [provider]  oxyCODONE-acetaminophen (PERCOCET/ROXICET) 5-325 MG tablet Take 2 tablets by mouth every 6 (six) hours as needed for moderate pain. Patient not taking: Reported on 12/30/2016 12/30/15   Louellen Molder, MD     Family History  Problem Relation Age of Onset  . Allergies Mother   . Allergies Other        children  . Rheum arthritis Mother   . Lymphoma Sister     Social History   Socioeconomic History  . Marital status: Widowed    Spouse name: Not on file  . Number of children: Y  . Years of education: Not on file  . Highest education level: Not on file  Occupational History  . Occupation: retired from office work  Social Needs  . Financial resource strain: Not on file  . Food insecurity:    Worry: Not on file    Inability: Not on file  . Transportation needs:    Medical: Not on file    Non-medical: Not on file  Tobacco Use  . Smoking status: Never Smoker  . Smokeless tobacco: Never Used    Substance and Sexual Activity  . Alcohol use: No  . Drug use: No  . Sexual activity: Not on file  Lifestyle  . Physical activity:    Days per week: Not on file    Minutes per session: Not on file  . Stress: Not on file  Relationships  . Social connections:    Talks on phone: Not on file    Gets together: Not on file    Attends religious service: Not on file    Active member of club or organization: Not on file    Attends meetings of clubs or organizations: Not on file    Relationship status: Not on file  Other Topics Concern  . Not on file  Social History Narrative  . Not on file     Review of Systems: A 12 point ROS discussed and pertinent positives are indicated in the HPI above.  All other  systems are negative.  Review of Systems  Vital Signs: BP 117/68   Pulse (!) 57   Temp 98 F (36.7 C) (Oral)   Resp 14   Ht _0  (1.549 m)   Wt 54.4 kg   SpO2 98%   BMI 22.67 kg/m   Physical Exam Constitutional:      General: She is not in acute distress.    Appearance: She is well-developed.  HENT:     Head: Normocephalic.  Neck:     Musculoskeletal: Normal range of motion.     Vascular: No JVD.  Cardiovascular:     Rate and Rhythm: Normal rate and regular rhythm.     Heart sounds: Normal heart sounds.  Pulmonary:     Effort: Pulmonary effort is normal. No respiratory distress.     Breath sounds: Normal breath sounds.  Abdominal:     General: Bowel sounds are normal.     Palpations: Abdomen is soft. There is no mass.     Tenderness: There is no abdominal tenderness.  Neurological:     Mental Status: She is alert and oriented to person, place, and time.       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 Plan as per Dr. Katrinka Blazing discussion with pt.   Thank you for this interesting consult.  I greatly enjoyed meeting HADAR ELGERSMA and look forward to  participating in their care.  A copy of this report was sent to the requesting provider on this date.  Electronically Signed: Ascencion Dike 07/27/2018, 3:13 PM   I spent a total of 20 minutes in face to face in clinical consultation, greater than 50% of which was counseling/coordinating care for right renal mass

## 2018-07-28 ENCOUNTER — Ambulatory Visit: Payer: Medicare Other | Attending: Internal Medicine | Admitting: Internal Medicine

## 2018-07-28 ENCOUNTER — Encounter (HOSPITAL_BASED_OUTPATIENT_CLINIC_OR_DEPARTMENT_OTHER): Payer: Self-pay | Admitting: Internal Medicine

## 2018-07-28 VITALS — BP 118/72 | HR 72 | Resp 18 | Ht 64.0 in | Wt 84.0 lb

## 2018-07-28 DIAGNOSIS — K219 Gastro-esophageal reflux disease without esophagitis: Secondary | ICD-10-CM | POA: Insufficient documentation

## 2018-07-28 DIAGNOSIS — K5900 Constipation, unspecified: Secondary | ICD-10-CM | POA: Insufficient documentation

## 2018-07-28 DIAGNOSIS — N390 Urinary tract infection, site not specified: Principal | ICD-10-CM | POA: Insufficient documentation

## 2018-07-28 MED ORDER — OMEPRAZOLE 40 MG CAPSULE,DELAYED RELEASE
40.0000 mg | DELAYED_RELEASE_CAPSULE | Freq: Every day | ORAL | 4 refills | Status: DC
Start: 2018-07-28 — End: 2019-11-22

## 2018-07-28 MED ORDER — LACTULOSE 10 GRAM/15 ML ORAL SOLUTION
15.0000 mL | Freq: Every day | ORAL | 11 refills | Status: DC
Start: 2018-07-28 — End: 2019-11-22

## 2018-07-28 NOTE — Progress Notes (Signed)
[facility] - Internal Medicine   [address] [place] [address] [address]  Dept: [phone]  Dept Fax: [phone]    [name]  December 26, 1934  [id number]    Date of Service: 07/28/2018  9:30 AM EST    Chief complaint:   Chief Complaint   Patient presents with   . Hospital Follow Up     Patient was seen in [facility] 07/06/18 for abdominal pain and bloat, she had elevated blood pressure and UTI. Patient was given protonix and keflex. Patient is in discomfort due to bowels.       HPI:     This is a case of a 84 y.o. year old female who comes in today for emergency room follow-up.  Treated for UTI.  Treated for GERD.  Patient has symptoms of continued upper abdominal discomfort with eating.  Some nausea.  No vomiting.  Some symptoms of GERD.  Treated with Keflex 500 mg 3 times a day for urinary tract infection.  That cause some intestinal distress also.  Constipation as described.  No melena bright red rectal bleeding.  Started on Protonix with some improvement symptoms from the emergency room however this is an expensive medication.  She would like to change to something different.    ROS:    CARDIAC:  No chest pain, DOE, or palpitations. No orthopnea or PND.    PULMONARY:  No cough, sputum, or hemoptysis. No fever,chills, or night sweats.    GU :  Voiding without difficulty     NEURO: No headache , diplopia or loss of function of limbs.    Current Outpatient Medications   Medication Sig   . atorvastatin (LIPITOR) 10 mg Oral Tablet    . Ibuprofen (MOTRIN) 800 mg Oral Tablet Take 1 Tab (800 mg total) by mouth Three times a day as needed for Pain   . lactulose (ENULOSE) 10 gram/15 mL Oral Solution Take 15 mL by mouth Once a day   . levothyroxine (SYNTHROID) 75 mcg Oral Tablet Take 1 Tab (75 mcg total) by mouth Every morning   . omeprazole (PRILOSEC) 40 mg Oral Capsule, Delayed Release(E.C.) Take 1 Cap (40 mg total) by mouth Once a day   . pantoprazole (PROTONIX) 40 mg Oral Tablet, Delayed Release  (E.C.) Take 1 Tab (40 mg total) by mouth Once a day for 30 days   . pravastatin (PRAVACHOL) 10 mg Oral Tablet Take 1 Tab (10 mg total) by mouth Every evening       Objective:     BP 118/72 (Site: Left, Patient Position: Sitting, Cuff Size: Adult)   Pulse 72   Resp 18   Ht 1.626 m (5\' 4" )   Wt (!) 38.1 kg (84 lb)   LMP  (LMP Unknown)   SpO2 95%   BMI 14.42 kg/m       General appearance: alert, oriented x 3, in her normal state, cooperative, not in apparent distress, appearing stated age   Neck: No lymphadenopathy or thyroid anomalies noted, carotids 2 + bilaterally without bruit  Lungs: clear to auscultation bilaterally. No crackles or wheeze noted. Normal respiratory effort  Heart: No JVD. Regular rate and rhythm, S1, S2 normal.  No gallop or rub. No murmur  Abdomen: soft, non-tender. Bowel sounds normal. No hepatosplenomegaly.  Extremities: no cyanosis or edema.  Pulses intact.  No calf pain    Assessment/Plan     ENCOUNTER DIAGNOSES     ICD-10-CM   1. UTI (urinary  tract infection).  Check urine culture to see if infection eradicated N39.0   2. Gastroesophageal reflux disease, esophagitis presence not specified.  Change Protonix to Prilosec.  Patient also given a card for Protonix. K21.9   3. Constipation, unspecified constipation type lactulose.  15 cc p.o. Daily. K59.00       Of note is I strongly recommended evaluation for upper and lower endoscopy.  Patient did not want to proceed with that at this time.  I discussed risk of missing malignancies.  She will discussed with her daughter and let me know her decision when she is re-evaluated in 2 weeks.          Orders Placed This Encounter   . URINE CULTURE   . lactulose (ENULOSE) 10 gram/15 mL Oral Solution   . omeprazole (PRILOSEC) 40 mg Oral Capsule, Delayed Release(E.C.)         The patient was given ample opportunity to ask questions and those questions were answered to the patient's satisfaction. The patient was encouraged to be involved in their own  care, and all diagnoses, medications, and medication side-effects were discussed.  A copy of the patient's medication list was printed and given to the patient. A good faith effort was made to reconcile the patient's medications.  The patient was told to contact me with any additional questions or concerns, or go to the ED in an emergency.     Follow up: Return in about 2 weeks (around 08/11/2018).    This note was partially generated using MModal Fluency Direct system, and there may be some incorrect words, spellings, and punctuation that were not noted in checking the note before saving.    Harrie Foreman, M.D.    [facility] - Internal Medicine   [address] [place] [address] [address]  Dept: [phone]  Dept Fax: [phone]

## 2018-07-29 ENCOUNTER — Encounter (HOSPITAL_BASED_OUTPATIENT_CLINIC_OR_DEPARTMENT_OTHER): Payer: Self-pay | Admitting: Internal Medicine

## 2018-07-30 ENCOUNTER — Encounter (HOSPITAL_BASED_OUTPATIENT_CLINIC_OR_DEPARTMENT_OTHER): Payer: Self-pay

## 2018-07-31 LAB — URINE CULTURE: URINE CULTURE: NO GROWTH

## 2018-08-12 ENCOUNTER — Encounter (HOSPITAL_BASED_OUTPATIENT_CLINIC_OR_DEPARTMENT_OTHER): Payer: Self-pay | Admitting: Internal Medicine

## 2018-08-12 ENCOUNTER — Ambulatory Visit: Payer: Medicare Other | Attending: Internal Medicine | Admitting: Internal Medicine

## 2018-08-12 VITALS — BP 125/66 | HR 70 | Resp 18 | Ht 64.0 in | Wt 82.0 lb

## 2018-08-12 DIAGNOSIS — K5900 Constipation, unspecified: Secondary | ICD-10-CM | POA: Insufficient documentation

## 2018-08-12 DIAGNOSIS — K219 Gastro-esophageal reflux disease without esophagitis: Principal | ICD-10-CM | POA: Insufficient documentation

## 2018-08-12 NOTE — Progress Notes (Signed)
[facility] - Internal Medicine   [address] [place] [address] [address]  Dept: [phone]  Dept Fax: [phone]    [name]  Apr 11, 1935  [id number]    Date of Service: 08/12/2018 10:00 AM EST    Chief complaint:   Chief Complaint   Patient presents with   . Follow-up     Patient present for a 2 week follow up.       HPI:     This is a case of a 84 y.o. year old female who comes in today for consultation.  Symptoms dramatically improved.  Constipation resolved.  Bowels soft and daily once a time.  No melena or bright red rectal bleeding.    Symptoms of GERD controlled with medication.  Is likes expensive than the prior medication.  Patient will make phone calls to different pharmacies to see where she can get her best deal since she is on a fixed income.    ROS:    As above.    Current Outpatient Medications   Medication Sig   . atorvastatin (LIPITOR) 10 mg Oral Tablet    . Ibuprofen (MOTRIN) 800 mg Oral Tablet Take 1 Tab (800 mg total) by mouth Three times a day as needed for Pain   . lactulose (ENULOSE) 10 gram/15 mL Oral Solution Take 15 mL by mouth Once a day   . levothyroxine (SYNTHROID) 75 mcg Oral Tablet Take 1 Tab (75 mcg total) by mouth Every morning   . omeprazole (PRILOSEC) 40 mg Oral Capsule, Delayed Release(E.C.) Take 1 Cap (40 mg total) by mouth Once a day   . pravastatin (PRAVACHOL) 10 mg Oral Tablet Take 1 Tab (10 mg total) by mouth Every evening       Objective:     BP 125/66 (Site: Left, Patient Position: Sitting, Cuff Size: Adult)   Pulse 70   Resp 18   Ht 1.626 m (5\' 4" )   Wt (!) 37.2 kg (82 lb)   LMP  (LMP Unknown)   SpO2 97%   BMI 14.08 kg/m       General appearance: alert, oriented x 3, in her normal state, cooperative, not in apparent distress, appearing stated age   Abdomen: soft, non-tender. Bowel sounds normal. No hepatosplenomegaly.      Assessment/Plan     ENCOUNTER DIAGNOSES     ICD-10-CM   1. Gastroesophageal reflux disease, esophagitis  presence not specified K21.9   2. Constipation, unspecified constipation type K59.00         15 minutes was spent in direct face-to-face consultation with patient.  Questions were answered        No orders of the defined types were placed in this encounter.        The patient was given ample opportunity to ask questions and those questions were answered to the patient's satisfaction. The patient was encouraged to be involved in their own care, and all diagnoses, medications, and medication side-effects were discussed.  A copy of the patient's medication list was printed and given to the patient. A good faith effort was made to reconcile the patient's medications.  The patient was told to contact me with any additional questions or concerns, or go to the ED in an emergency.     Follow up: Return if symptoms worsen or fail to improve.    This note was partially generated using MModal Fluency Direct system, and there may be some incorrect words, spellings, and punctuation  that were not noted in checking the note before saving.    [name], M.D.    [facility] - Internal Medicine   [address] [place] [address] [address]  Dept: [phone]  Dept Fax: [phone]

## 2018-09-25 IMAGING — CR DG CHEST 2V
2 series · 2 of 2 positions shown · non-contrast
Comparison: CT scan chest December 16, 2016 and chest x-ray March 24, 2016

CLINICAL DATA: Mycobacterial avium intracellulare infection, cough,
nonsmoker.

EXAM:
CHEST  2 VIEW

[w chest pa]
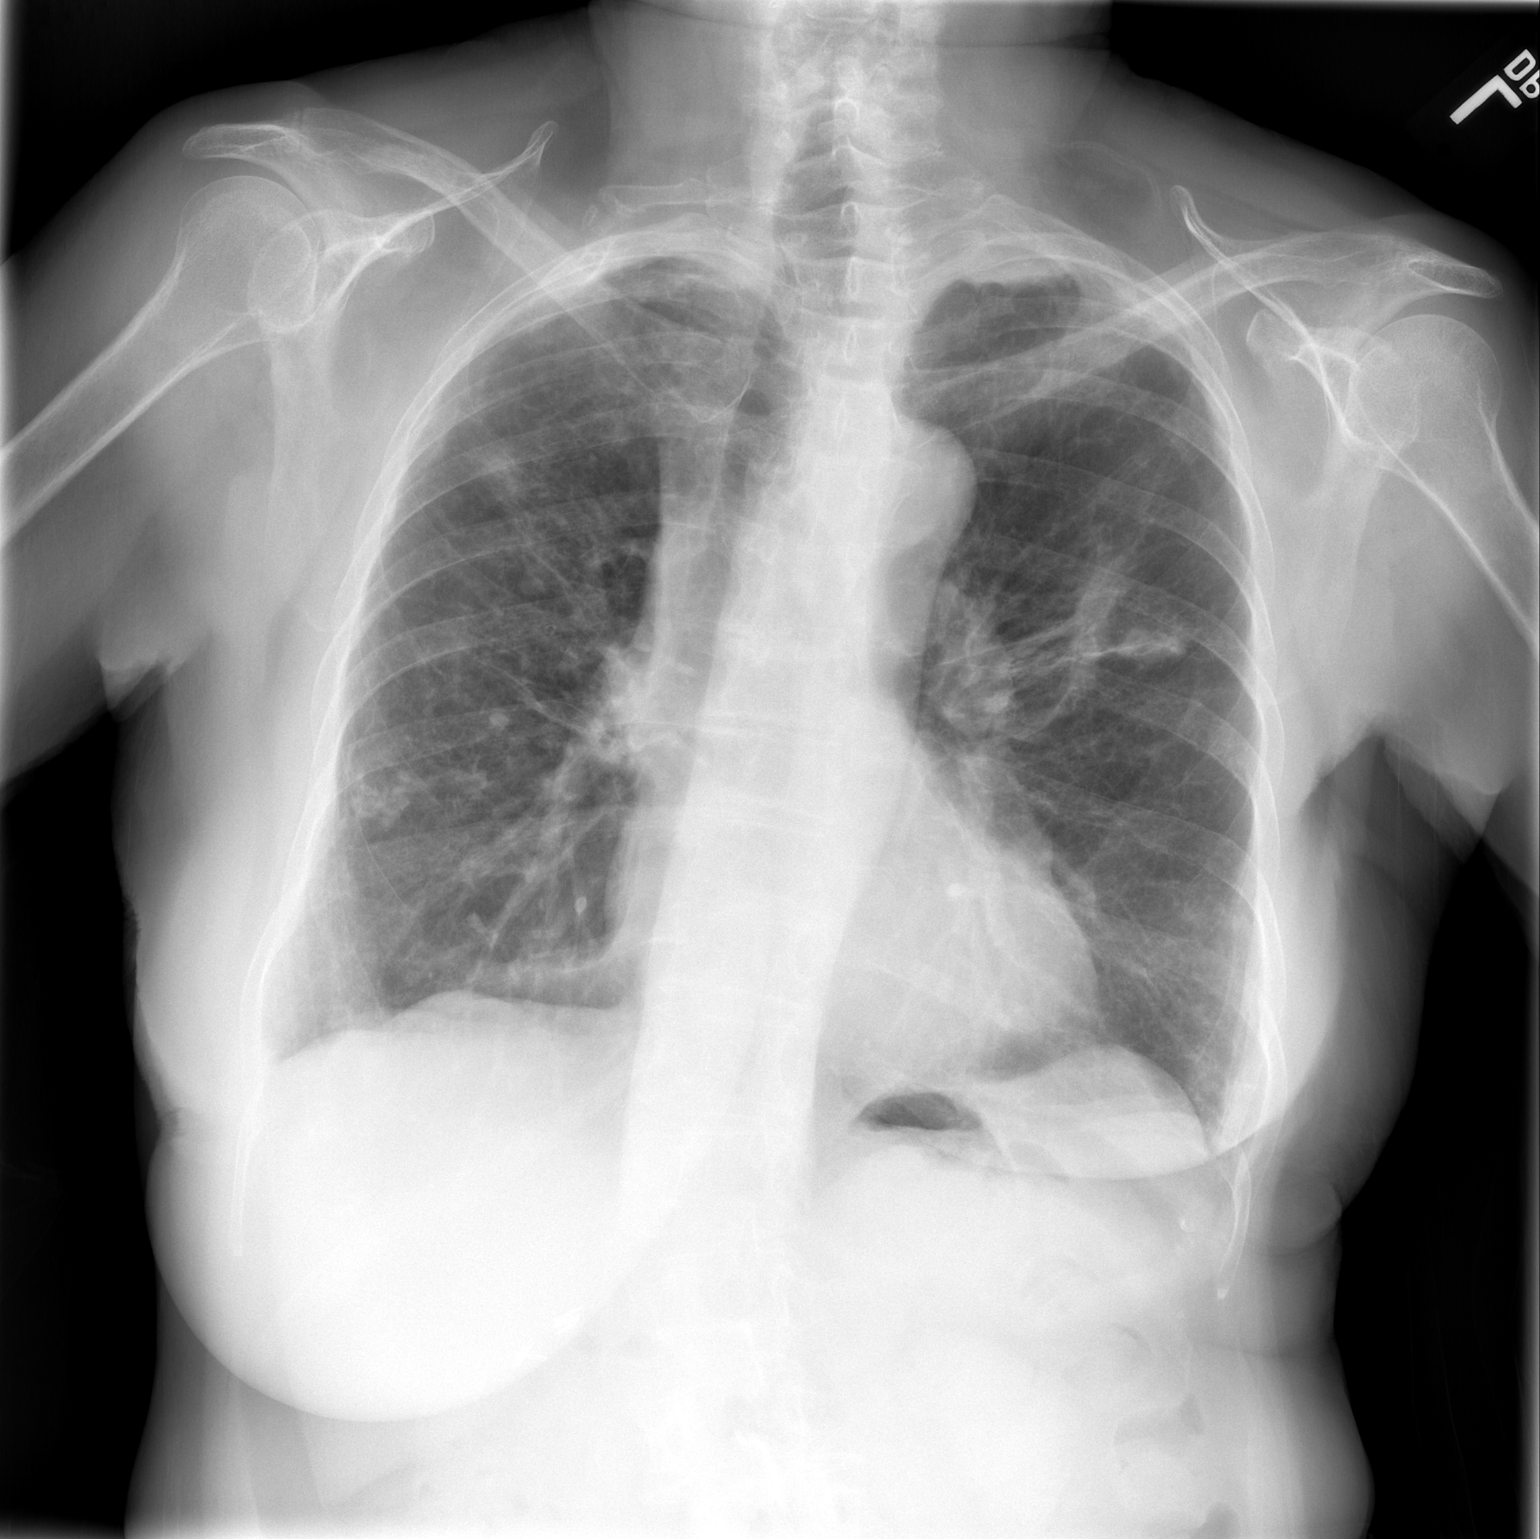

[w chest lat]
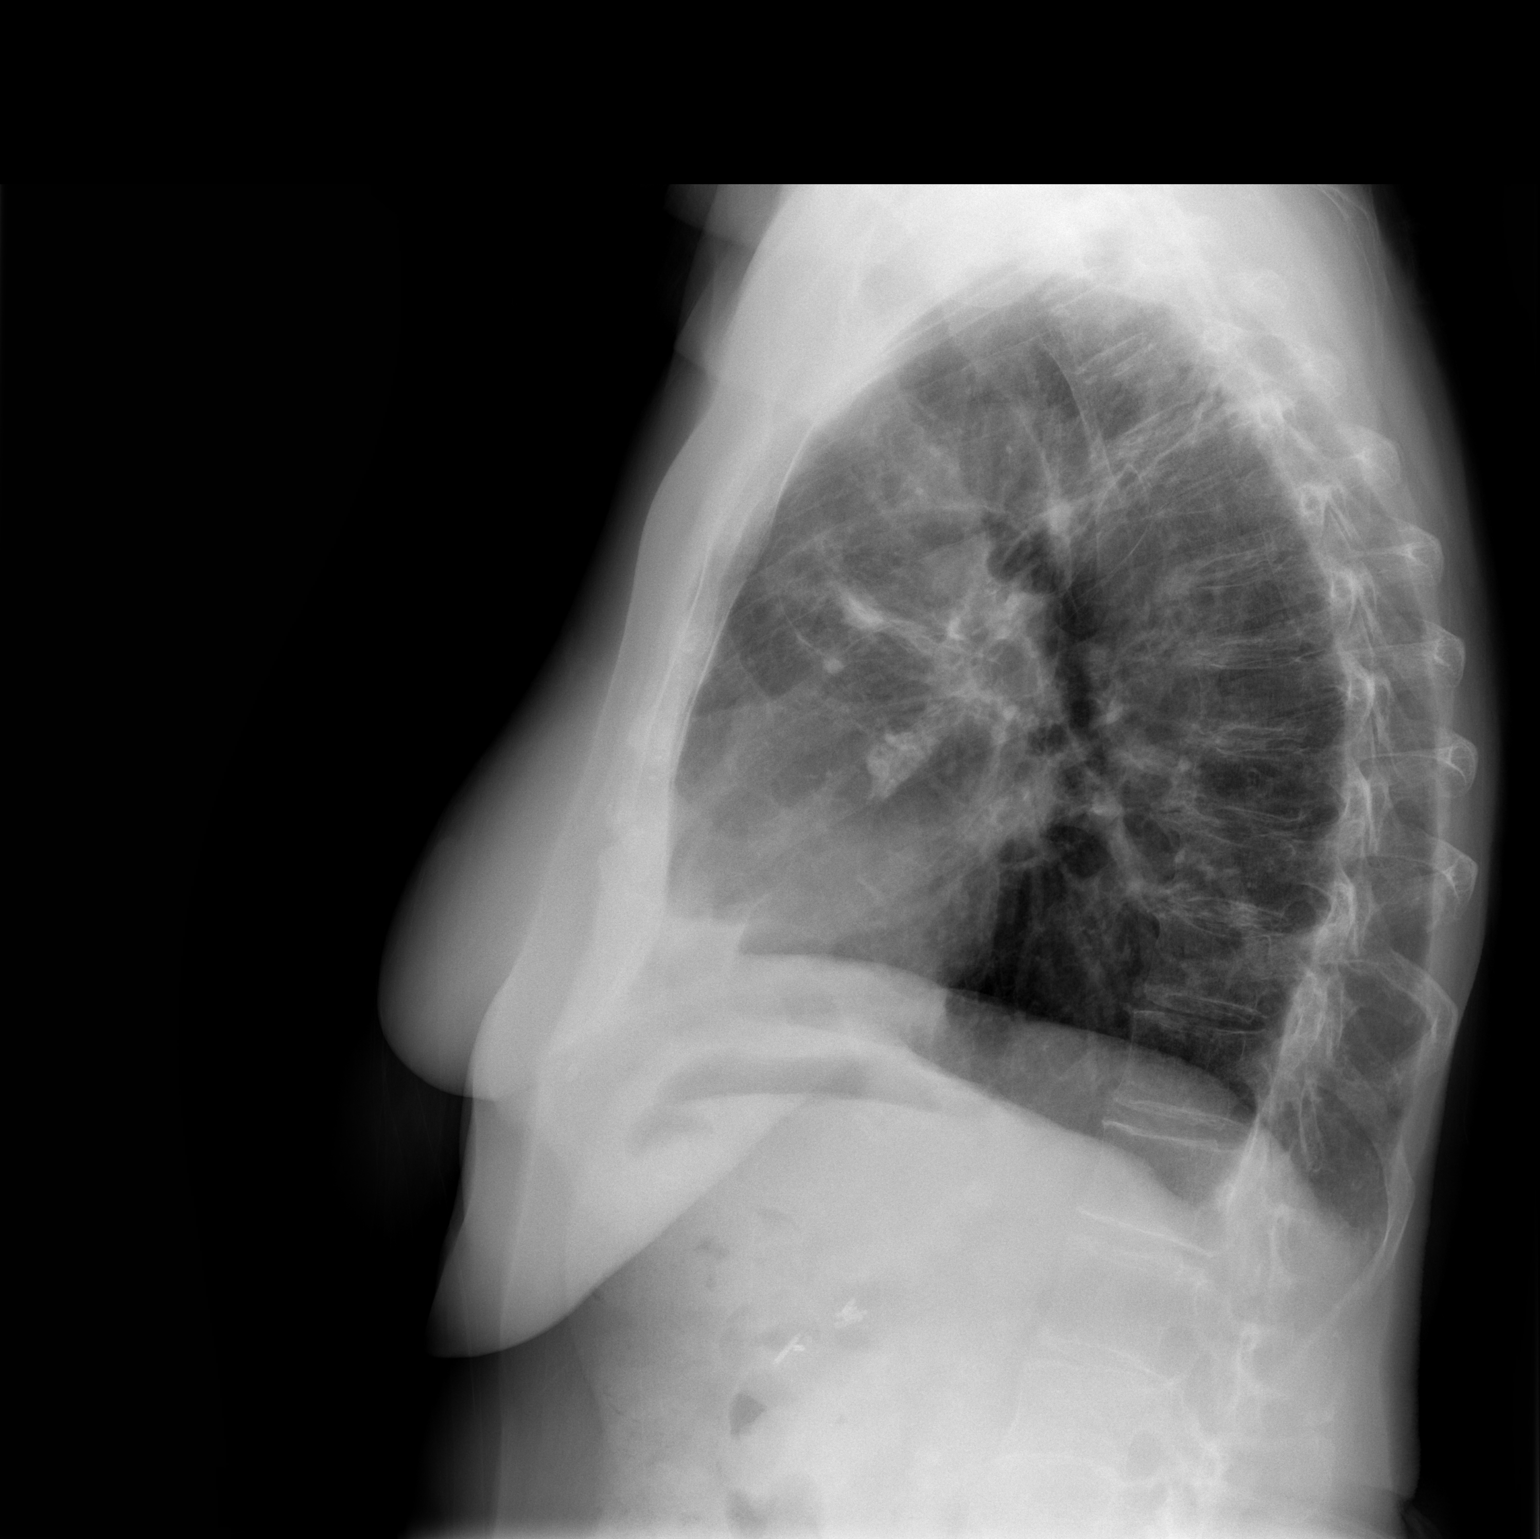

[2 of 2 positions shown; findings below may reference images not displayed]

FINDINGS: The lungs are well-expanded. There are stable interstitial and
alveolar densities in both lungs. No new abnormalities are observed.
The heart and pulmonary vascularity are normal. There is no pleural
effusion. The bony thorax exhibits no acute abnormality.
IMPRESSION: Chronic interstitial and alveolar opacities compatible with FOO
infection. No acute abnormalities.

## 2018-11-09 ENCOUNTER — Telehealth (HOSPITAL_BASED_OUTPATIENT_CLINIC_OR_DEPARTMENT_OTHER): Payer: Self-pay | Admitting: Internal Medicine

## 2018-11-09 MED ORDER — CEFUROXIME AXETIL 250 MG TABLET
250.00 mg | ORAL_TABLET | Freq: Two times a day (BID) | ORAL | 0 refills | Status: AC
Start: 2018-11-09 — End: 2018-11-16

## 2018-11-09 NOTE — Telephone Encounter (Signed)
Delsey has a UTI and would like antibiotics sent to [facility] pharmacy.  Delfin Gant, MA  11/09/2018, 10:16

## 2018-11-09 NOTE — Telephone Encounter (Signed)
Ceftin.  250 mg.  Dispense 14. One p.o. B.i.d..  No refills.  Notify us if symptoms do not resolve.

## 2018-12-17 DIAGNOSIS — Z853 Personal history of malignant neoplasm of breast: Secondary | ICD-10-CM | POA: Diagnosis not present

## 2018-12-22 ENCOUNTER — Other Ambulatory Visit (HOSPITAL_BASED_OUTPATIENT_CLINIC_OR_DEPARTMENT_OTHER): Payer: Self-pay | Admitting: Internal Medicine

## 2018-12-22 MED ORDER — IBUPROFEN 800 MG TABLET
800.00 mg | ORAL_TABLET | Freq: Three times a day (TID) | ORAL | 3 refills | Status: DC | PRN
Start: 2018-12-22 — End: 2020-01-19

## 2018-12-24 ENCOUNTER — Encounter (HOSPITAL_BASED_OUTPATIENT_CLINIC_OR_DEPARTMENT_OTHER): Payer: Self-pay | Admitting: Internal Medicine

## 2018-12-24 ENCOUNTER — Ambulatory Visit: Payer: Medicare Other | Attending: Internal Medicine | Admitting: Internal Medicine

## 2018-12-24 ENCOUNTER — Other Ambulatory Visit: Payer: Self-pay

## 2018-12-24 VITALS — BP 122/76 | HR 75 | Resp 18 | Ht 64.0 in | Wt 78.2 lb

## 2018-12-24 DIAGNOSIS — R0602 Shortness of breath: Secondary | ICD-10-CM | POA: Insufficient documentation

## 2018-12-24 DIAGNOSIS — Z1389 Encounter for screening for other disorder: Secondary | ICD-10-CM

## 2018-12-24 DIAGNOSIS — E782 Mixed hyperlipidemia: Secondary | ICD-10-CM | POA: Insufficient documentation

## 2018-12-24 DIAGNOSIS — Z0000 Encounter for general adult medical examination without abnormal findings: Secondary | ICD-10-CM

## 2018-12-24 DIAGNOSIS — E039 Hypothyroidism, unspecified: Secondary | ICD-10-CM | POA: Insufficient documentation

## 2018-12-24 DIAGNOSIS — Z79899 Other long term (current) drug therapy: Secondary | ICD-10-CM | POA: Insufficient documentation

## 2018-12-24 DIAGNOSIS — Z5181 Encounter for therapeutic drug level monitoring: Secondary | ICD-10-CM | POA: Insufficient documentation

## 2018-12-24 DIAGNOSIS — Z681 Body mass index (BMI) 19 or less, adult: Secondary | ICD-10-CM

## 2018-12-24 LAB — THYROXINE, FREE (FREE T4): THYROXINE (T4), FREE: 2.08 ng/dL — ABNORMAL HIGH (ref 0.61–1.12)

## 2018-12-24 LAB — THYROID STIMULATING HORMONE (SENSITIVE TSH): TSH: 0.039 u[IU]/mL — ABNORMAL LOW (ref 0.450–5.330)

## 2018-12-24 LAB — CBC
HCT: 40.7 % (ref 34.6–46.2)
HGB: 13.7 g/dL (ref 11.8–15.8)
MCH: 30.3 pg (ref 27.6–33.2)
MCHC: 33.7 g/dL (ref 32.6–35.4)
MCV: 89.6 fL (ref 82.3–96.7)
MPV: 8.8 fL (ref 6.6–10.2)
PLATELETS: 236 10*3/uL (ref 140–440)
RBC: 4.54 10*6/uL (ref 3.80–5.24)
RDW: 13.7 % (ref 12.4–15.2)
WBC: 7.1 10*3/uL (ref 3.5–10.3)

## 2018-12-24 LAB — BASIC METABOLIC PANEL, FASTING
ANION GAP: 9 mmol/L
BUN/CREA RATIO: 12
BUN: 9 mg/dL — ABNORMAL LOW (ref 10–25)
CALCIUM: 9.6 mg/dL (ref 8.8–10.3)
CHLORIDE: 100 mmol/L (ref 98–111)
CO2 TOTAL: 29 mmol/L (ref 21–35)
CREATININE: 0.76 mg/dL (ref <=1.30)
ESTIMATED GFR: >60 mL/min/{1.73_m2}
GLUCOSE: 94 mg/dL (ref 70–110)
POTASSIUM: 4.4 mmol/L (ref 3.5–5.0)
SODIUM: 138 mmol/L (ref 135–145)

## 2018-12-24 LAB — URINALYSIS, MACRO/MICRO
BILIRUBIN: NEGATIVE mg/dL
BLOOD: NEGATIVE mg/dL
GLUCOSE: NEGATIVE mg/dL
NITRITE: NEGATIVE
PH: 7 (ref 5.0–7.0)
PROTEIN: NEGATIVE mg/dL
SPECIFIC GRAVITY: 1.009 — ABNORMAL LOW (ref 1.010–1.025)
UROBILINOGEN: 1 mg/dL
WBC: 6.3 10*3/uL (ref 3.5–11.0)

## 2018-12-24 LAB — B-TYPE NATRIURETIC PEPTIDE (BNP),PLASMA: BNP: 52 pg/mL (ref 0–100)

## 2018-12-24 NOTE — Nursing Note (Signed)
12/24/18 0900   Medicare Wellness Assessment   Medicare initial or wellness physical in the last year? No   Advance Directives   Does patient have a living will or MPOA YES   Has patient provided [employer] with a copy? YES   Advance directive information given to the patient today? no   Activities of Daily Living   Do you need help with dressing, bathing, or walking? No   Do you need help with shopping, housekeeping, medications, or finances? No   Do you have rugs in hallways, broken steps, or poor lighting? No   Do you have grab bars in your bathroom, non-slip strips in your tub, and hand rails on your stairs? Yes   Urinary Incontinence Screen-Women only   Do you ever leak urine when you don't want to? YES   Cognitive Function Screen   What is you age? 1   What is the time to the nearest hour? 1   What is the year? 1   What is the name of this clinic? 1   Can the patient recognize two persons (the doctor, the nurse, home help, etc.)? 1   What is the date of your birth? (day and month sufficient)  1   In what year did World War II end? 1   Who is the current president of the [place]? 1   Count from 20 down to 1? 1   What address did I give you earlier? 1   Total Score 10   Depression Screen   Little interest or pleasure in doing things. 0   Feeling down, depressed, or hopeless 1   PHQ 2 Total 1   Hearing Screen   Have you noticed any hearing difficulties? Yes   Fall Risk Assessment   Do you feel unsteady when standing or walking? Yes   Do you worry about falling? Yes   Have you fallen in the past year? No   Timed up and go test (in seconds) 4   Vision Screen   Right Eye = 20   (patient declines)

## 2018-12-24 NOTE — H&P (Signed)
INTERNAL MEDICINE, POB  [address]  [facility] [phone]    Medicare Annual Wellness Visit    Name: [name] MRN:  [id number]   Date: 12/24/2018 Age: 84 y.o.       SUBJECTIVE:   [name] is a 84 y.o. female for presenting for Medicare Wellness exam.  No new surgeries.  No new medicines.  No new allergies to medicines.  Appetite been good.  Weight is been stable.  She does need to eat more.  We discussed that.  Remains physically mentally active.  Compliant with the COVID-19 precautions.  I have reviewed and reconciled the medication list with the patient today.    I have reviewed and updated as appropriate the past medical, family and social history. 12/24/2018 as summarized below:  Past Medical History:   Diagnosis Date   . Fibromyalgia    . Hypercholesterolemia      History reviewed. No pertinent surgical history.  Current Outpatient Medications   Medication Sig   . atorvastatin (LIPITOR) 10 mg Oral Tablet    . Ibuprofen (MOTRIN) 800 mg Oral Tablet Take 1 Tab (800 mg total) by mouth Three times a day as needed for Pain   . lactulose (ENULOSE) 10 gram/15 mL Oral Solution Take 15 mL by mouth Once a day   . levothyroxine (SYNTHROID) 75 mcg Oral Tablet Take 1 Tab (75 mcg total) by mouth Every morning   . omeprazole (PRILOSEC) 40 mg Oral Capsule, Delayed Release(E.C.) Take 1 Cap (40 mg total) by mouth Once a day (Patient not taking: Reported on 12/24/2018)   . pravastatin (PRAVACHOL) 10 mg Oral Tablet Take 1 Tab (10 mg total) by mouth Every evening     Family Medical History:     Problem Relation (Age of Onset)    Hypertension (High Blood Pressure) Mother, Father            Social History     Socioeconomic History   . Marital status: Widowed     Spouse name: Not on file   . Number of children: Not on file   . Years of education: Not on file   . Highest education level: Not on file   Tobacco Use   . Smoking status: Never Smoker   . Smokeless tobacco: Never Used   Substance and Sexual Activity    . Drug use: Never   . Sexual activity: Not Currently   Lifestyle   . Physical activity     Days per week: 3 days     Minutes per session: 30 min   . Stress: Not at all   Relationships   . Social connections     Talks on phone: More than three times a week     Gets together: More than three times a week     Attends religious service: More than 4 times per year     Active member of club or organization: Yes     Attends meetings of clubs or organizations: 1 to 4 times per year     Relationship status: Widowed   . Intimate partner violence     Fear of current or ex partner: No     Emotionally abused: No     Physically abused: No     Forced sexual activity: No       REVIEW OF SYSTEMS:  CARDIAC:  No chest pain,  or palpitations. No orthopnea or PND.  Has describe some increasing dyspnea on exertion although  she attributes that more to her allergies and seasonal.    PULMONARY:  No cough, sputum, or hemoptysis. No fever,chills, or night sweats.    GI:  No nausea or vomiting. No abdominal pain. No change in bowel habits          No melena or bright red rectal bleeding.    GU :  Voiding without difficulty .  No dysuria hematuria.    NEURO: No headache , diplopia or loss of function of limbs.     List of Current Health Care Providers   Care Team     PCP     Name Type Specialty Phone Number    Marylee FlorasAngotti, Emalene Welte T, MD Physician INTERNAL MEDICINE [phone]          Care Team     Name Type Specialty Phone Number    Marylee FlorasAngotti, Burnett Spray T, MD Physician INTERNAL MEDICINE [phone]                  Health Maintenance   Topic Date Due   . Adult Tdap-Td (1 - Tdap) 03/16/1956   . Shingles Vaccine (1 of 2) 03/17/1987   . Pneumococcal 65+ Years Low Risk (1 of 2 - PCV13) 03/16/2002   . Osteoporosis screening  03/16/2002   . Depression Screening  12/23/2018   . Annual Wellness Exam  12/23/2018   . Influenza Vaccine (Season Ended) 03/15/2019     Medicare Wellness Assessment   Medicare initial or wellness physical in the last year?:  No  Advance Directives (optional)   Does patient have a living will or MPOA: YES   Has patient provided [employer] with a copy?: YES   Advance directive information given to the patient today?: no      Activities of Daily Living   Do you need help with dressing, bathing, or walking?: No   Do you need help with shopping, housekeeping, medications, or finances?: No   Do you have rugs in hallways, broken steps, or poor lighting?: No   Do you have grab bars in your bathroom, non-slip strips in your tub, and hand rails on your stairs?: Yes   Urinary Incontinence Screen (Women >=65 only)   Do you ever leak urine when you don't want to?: YES   Cognitive Function Screen (1=Yes, 0=No)   What is you age?: Correct   What is the time to the nearest hour?: Correct   What is the year?: Correct   What is the name of this clinic?: Correct   Can the patient recognize two persons (the doctor, the nurse, home help, etc.)?: Correct   What is the date of your birth? (day and month sufficient) : Correct   In what year did World War II end?: Correct   Who is the current president of the [place]?: Correct   Count from 20 down to 1?: Correct   What address did I give you earlier?: Correct   Total Score: 10   Hearing Screen   Have you noticed any hearing difficulties?: Yes   Fall Risk Screen   Do you feel unsteady when standing or walking?: Yes  Do you worry about falling?: Yes  Have you fallen in the past year?: No  Timed up and go test (in seconds): 4   Vision Screen   Right Eye = 20: (patient declines)       Depression Screen   Little interest or pleasure in doing things.: Not at all  Feeling down, depressed, or  hopeless: Several Days  PHQ 2 Total: 1        OBJECTIVE:   Physical Exam:  BP 122/76 (Site: Left, Patient Position: Sitting, Cuff Size: Adult)   Pulse 75   Resp 18   Ht 1.626 m (5\' 4" )   Wt (!) 35.5 kg (78 lb 3.2 oz)   LMP  (LMP Unknown)   SpO2 94%   BMI 13.42 kg/m       General appearance: alert, oriented x 3,  in her normal state, cooperative, not in apparent distress, appearing stated age   Neck: No lymphadenopathy or thyroid anomalies noted, carotids 2 + bilaterally without bruit  Lungs: clear to auscultation bilaterally. No crackles or wheeze noted. Normal respiratory effort  Heart: No JVD. Regular rate and rhythm, S1, S2 normal.  No gallop or rub.  One to 2/6 right 2nd intercostal space systolic murmur  Abdomen: soft, non-tender. Bowel sounds normal. No hepatosplenomegaly.  Extremities: no cyanosis or edema.  Pulses intact.  No calf pain    Health Maintenance Due   Topic Date Due   . Adult Tdap-Td (1 - Tdap) 03/16/1956   . Shingles Vaccine (1 of 2) 03/17/1987   . Pneumococcal 65+ Years Low Risk (1 of 2 - PCV13) 03/16/2002   . Osteoporosis screening  03/16/2002   . Depression Screening  12/23/2018   . Annual Wellness Exam  12/23/2018       ASSESSMENT & PLAN:    1. Medicare annual wellness visit, subsequent  Completed    2. Acquired hypothyroidism  Check laboratory studies adjust medicines if needed  - THYROID STIMULATING HORMONE (SENSITIVE TSH); Future  - THYROXINE, FREE (FREE T4); Future    3. Mixed hyperlipidemia  Check lipid panel but keep in mind patient's advanced age and low BMI    4. SOB (shortness of breath)  Evaluate for etiology  - B-TYPE NATRIURETIC PEPTIDE; Future  - BASIC METABOLIC PANEL, FASTING; Future  - XR CHEST PA AND LATERAL; Future  - ECG 12 LEAD - ADULT; Future  - TRANSTHORACIC ECHOCARDIOGRAM - ADULT; Future  - CBC; Future  - URINALYSIS WITH REFLEX MICROSCOPIC AND CULTURE IF POSITIVE; Future       Identified Risk Factors/ Recommended Actions   BMI addressed: Advised on importance of nutrition to increase below normal BMI.     Fall Risk Follow up plan of care: Discussed optimizing home safety    Urinary Incontinence Plan of Care: Patient states symptoms are minimal.  Not suicidal.  Does not want to take any medications at this time.  Orders Placed This Encounter   . XR CHEST PA AND LATERAL   .  B-TYPE NATRIURETIC PEPTIDE   . BASIC METABOLIC PANEL, FASTING   . CBC   . URINALYSIS WITH REFLEX MICROSCOPIC AND CULTURE IF POSITIVE   . THYROID STIMULATING HORMONE (SENSITIVE TSH)   . THYROXINE, FREE (FREE T4)   . URINALYSIS, MACRO/MICRO   . ECG 12 LEAD - ADULT   . TRANSTHORACIC ECHOCARDIOGRAM - ADULT          The patient has been educated about risk factors and recommended preventive care. Written Prevention Plan completed/ updated and given to patient (see After Visit Summary).    Return in about 6 months (around 06/25/2019).    Filbert Berthold, MD  [place]  INTERNAL MEDICINE, POB  [place]  [place] [phone]

## 2018-12-27 ENCOUNTER — Other Ambulatory Visit (HOSPITAL_BASED_OUTPATIENT_CLINIC_OR_DEPARTMENT_OTHER): Payer: Self-pay | Admitting: Internal Medicine

## 2018-12-27 LAB — URINE CULTURE,ROUTINE: URINE CULTURE: NO GROWTH

## 2018-12-27 MED ORDER — LEVOTHYROXINE 25 MCG TABLET
25.00 ug | ORAL_TABLET | Freq: Every morning | ORAL | 11 refills | Status: DC
Start: 2018-12-27 — End: 2019-07-25

## 2018-12-27 NOTE — Telephone Encounter (Signed)
-----   Message from Filbert Berthold, MD sent at 12/24/2018  7:09 PM EDT -----  Please call patient and informed that all labs are within acceptable limits.  Thyroid pill to bed.  Decreased to 25 mcg.  Dispense 30. One p.o. Q.day.  Eleven refills.  Updated in EMR.  Recheck free T4 and TSH in 4 weeks  Filbert Berthold, MD

## 2018-12-31 ENCOUNTER — Encounter (HOSPITAL_BASED_OUTPATIENT_CLINIC_OR_DEPARTMENT_OTHER): Payer: Self-pay | Admitting: Internal Medicine

## 2019-01-25 ENCOUNTER — Encounter (HOSPITAL_COMMUNITY): Payer: Self-pay

## 2019-03-04 ENCOUNTER — Ambulatory Visit (HOSPITAL_COMMUNITY)
Admission: RE | Admit: 2019-03-04 | Discharge: 2019-03-04 | Disposition: A | Discharge status: Home / Self Care | Payer: Medicare Other | Source: Ambulatory Visit | Attending: Internal Medicine | Admitting: Internal Medicine

## 2019-03-04 ENCOUNTER — Ambulatory Visit
Admission: RE | Admit: 2019-03-04 | Discharge: 2019-03-04 | Disposition: A | Discharge status: Home / Self Care | Payer: Medicare Other | Source: Ambulatory Visit | Attending: Internal Medicine | Admitting: Internal Medicine

## 2019-03-04 ENCOUNTER — Other Ambulatory Visit: Payer: Self-pay

## 2019-03-04 DIAGNOSIS — R0602 Shortness of breath: Secondary | ICD-10-CM | POA: Insufficient documentation

## 2019-03-04 DIAGNOSIS — M4184 Other forms of scoliosis, thoracic region: Secondary | ICD-10-CM | POA: Insufficient documentation

## 2019-03-04 NOTE — Progress Notes (Signed)
Notified Patient   . Ars

## 2019-03-18 ENCOUNTER — Other Ambulatory Visit (HOSPITAL_COMMUNITY): Payer: Self-pay | Admitting: Specialist

## 2019-03-18 DIAGNOSIS — R0600 Dyspnea, unspecified: Secondary | ICD-10-CM

## 2019-04-20 ENCOUNTER — Ambulatory Visit (HOSPITAL_COMMUNITY): Payer: Medicare Other

## 2019-04-20 ENCOUNTER — Ambulatory Visit
Admission: RE | Admit: 2019-04-20 | Discharge: 2019-04-20 | Disposition: A | Discharge status: Home / Self Care | Payer: Medicare Other | Source: Ambulatory Visit | Attending: Specialist | Admitting: Specialist

## 2019-04-20 ENCOUNTER — Other Ambulatory Visit: Payer: Self-pay

## 2019-04-20 ENCOUNTER — Encounter (HOSPITAL_COMMUNITY): Payer: Self-pay

## 2019-04-20 DIAGNOSIS — R0600 Dyspnea, unspecified: Secondary | ICD-10-CM | POA: Insufficient documentation

## 2019-04-20 LAB — MYOCARDIAL PERFUSION COMPLETE: Nuc Stress EF: 69 % — NL

## 2019-04-20 MED ORDER — REGADENOSON 0.4 MG/5 ML INTRAVENOUS SYRINGE
0.40 mg | INJECTION | INTRAVENOUS | Status: AC
Start: 2019-04-20 — End: 2019-04-20
  Administered 2019-04-20: 0.4 mg via INTRAVENOUS

## 2019-05-05 ENCOUNTER — Telehealth (HOSPITAL_BASED_OUTPATIENT_CLINIC_OR_DEPARTMENT_OTHER): Payer: Self-pay | Admitting: Internal Medicine

## 2019-05-05 DIAGNOSIS — N23 Unspecified renal colic: Secondary | ICD-10-CM

## 2019-05-05 NOTE — Telephone Encounter (Signed)
Check basic metabolic panel along with a urinalysis with reflex culture

## 2019-05-05 NOTE — Telephone Encounter (Signed)
Patient is requesting lab work to check kidney function, states she is having some trouble again  [place], [place]  05/05/2019, 09:29

## 2019-05-06 ENCOUNTER — Other Ambulatory Visit: Payer: Self-pay

## 2019-05-06 ENCOUNTER — Ambulatory Visit: Payer: Medicare Other | Attending: Internal Medicine

## 2019-05-06 DIAGNOSIS — N23 Unspecified renal colic: Secondary | ICD-10-CM | POA: Insufficient documentation

## 2019-05-06 LAB — BASIC METABOLIC PANEL, FASTING
ANION GAP: 2 mmol/L
BUN/CREA RATIO: 12
BUN: 10 mg/dL (ref 10–25)
CALCIUM: 9.4 mg/dL (ref 8.8–10.3)
CHLORIDE: 102 mmol/L (ref 98–111)
CO2 TOTAL: 31 mmol/L (ref 21–35)
CREATININE: 0.82 mg/dL (ref <=1.30)
ESTIMATED GFR: >60 mL/min/{1.73_m2}
GLUCOSE: 96 mg/dL (ref 70–110)
POTASSIUM: 4 mmol/L (ref 3.5–5.0)
SODIUM: 135 mmol/L (ref 135–145)

## 2019-05-06 LAB — URINALYSIS, MACRO/MICRO
BILIRUBIN: NEGATIVE mg/dL
BLOOD: NEGATIVE mg/dL
GLUCOSE: NEGATIVE mg/dL
KETONES: NEGATIVE mg/dL
LEUKOCYTES: NEGATIVE WBCs/uL
NITRITE: NEGATIVE
PH: 7.5 — ABNORMAL HIGH (ref 5.0–7.0)
PROTEIN: NEGATIVE mg/dL
SPECIFIC GRAVITY: 1.007 — ABNORMAL LOW (ref 1.010–1.025)
UROBILINOGEN: 1 mg/dL

## 2019-05-06 NOTE — Nursing Note (Signed)
Venipuncture performed in clinic l arm.

## 2019-06-23 ENCOUNTER — Encounter (HOSPITAL_BASED_OUTPATIENT_CLINIC_OR_DEPARTMENT_OTHER): Payer: Self-pay | Admitting: Internal Medicine

## 2019-07-22 ENCOUNTER — Other Ambulatory Visit (HOSPITAL_BASED_OUTPATIENT_CLINIC_OR_DEPARTMENT_OTHER): Payer: Self-pay | Admitting: Internal Medicine

## 2019-07-22 ENCOUNTER — Other Ambulatory Visit (HOSPITAL_COMMUNITY): Payer: Medicare Other | Admitting: Internal Medicine

## 2019-07-22 ENCOUNTER — Encounter (HOSPITAL_BASED_OUTPATIENT_CLINIC_OR_DEPARTMENT_OTHER): Payer: Self-pay | Admitting: Internal Medicine

## 2019-07-22 ENCOUNTER — Ambulatory Visit: Payer: Medicare Other | Attending: Internal Medicine | Admitting: Internal Medicine

## 2019-07-22 ENCOUNTER — Other Ambulatory Visit (HOSPITAL_BASED_OUTPATIENT_CLINIC_OR_DEPARTMENT_OTHER): Payer: Self-pay

## 2019-07-22 ENCOUNTER — Other Ambulatory Visit: Payer: Self-pay

## 2019-07-22 VITALS — BP 132/78 | HR 84 | Resp 18 | Ht 64.0 in | Wt 78.4 lb

## 2019-07-22 DIAGNOSIS — E039 Hypothyroidism, unspecified: Secondary | ICD-10-CM

## 2019-07-22 DIAGNOSIS — E782 Mixed hyperlipidemia: Secondary | ICD-10-CM

## 2019-07-22 DIAGNOSIS — Z79899 Other long term (current) drug therapy: Secondary | ICD-10-CM | POA: Insufficient documentation

## 2019-07-22 DIAGNOSIS — R634 Abnormal weight loss: Secondary | ICD-10-CM

## 2019-07-22 DIAGNOSIS — Z5181 Encounter for therapeutic drug level monitoring: Secondary | ICD-10-CM | POA: Insufficient documentation

## 2019-07-22 LAB — THYROXINE, FREE (FREE T4): THYROXINE (T4), FREE: 0.52 ng/dL — ABNORMAL LOW (ref 0.61–1.12)

## 2019-07-22 LAB — URINALYSIS, MACRO/MICRO
BILIRUBIN: NEGATIVE mg/dL
BLOOD: NEGATIVE mg/dL
GLUCOSE: NEGATIVE mg/dL
KETONES: NEGATIVE mg/dL
NITRITE: NEGATIVE
PH: 7 (ref 5.0–7.0)
PROTEIN: NEGATIVE mg/dL
SPECIFIC GRAVITY: 1.009 — ABNORMAL LOW (ref 1.010–1.025)
UROBILINOGEN: 1 mg/dL

## 2019-07-22 LAB — CBC
HCT: 42.3 % (ref 34.6–46.2)
HGB: 14.5 g/dL (ref 11.8–15.8)
MCH: 31.7 pg (ref 27.6–33.2)
MCHC: 34.3 g/dL (ref 32.6–35.4)
MCV: 92.6 fL (ref 82.3–96.7)
MPV: 8.1 fL (ref 6.6–10.2)
PLATELETS: 249 10*3/uL (ref 140–440)
RBC: 4.57 10*6/uL (ref 3.80–5.24)
RDW: 13.6 % (ref 12.4–15.2)
WBC: 6.9 10*3/uL (ref 3.5–10.3)

## 2019-07-22 LAB — COMPREHENSIVE METABOLIC PANEL, NON-FASTING
ALBUMIN: 4.8 g/dL — ABNORMAL HIGH (ref 3.2–4.6)
ALKALINE PHOSPHATASE: 46 U/L (ref 20–130)
ALT (SGPT): 5 U/L (ref <=52)
ANION GAP: 7 mmol/L
AST (SGOT): 15 U/L (ref <=35)
BILIRUBIN TOTAL: 1.6 mg/dL — ABNORMAL HIGH (ref 0.3–1.2)
BUN/CREA RATIO: 15
BUN: 13 mg/dL (ref 10–25)
CALCIUM: 9.8 mg/dL (ref 8.8–10.3)
CHLORIDE: 96 mmol/L — ABNORMAL LOW (ref 98–111)
CO2 TOTAL: 30 mmol/L (ref 21–35)
CREATININE: 0.88 mg/dL (ref <=1.30)
ESTIMATED GFR: >60 mL/min/{1.73_m2}
GLUCOSE: 95 mg/dL (ref 70–110)
POTASSIUM: 4.1 mmol/L (ref 3.5–5.0)
PROTEIN TOTAL: 7.3 g/dL (ref 6.0–8.3)
SODIUM: 133 mmol/L — ABNORMAL LOW (ref 135–145)

## 2019-07-22 LAB — LIPID PANEL
CHOLESTEROL: 399 mg/dL — ABNORMAL HIGH (ref 0–199)
HDL CHOL: 91 mg/dL (ref >40)
LDL DIRECT: 283 mg/dL — ABNORMAL HIGH (ref 0–99)
TRIGLYCERIDES: 107 mg/dL (ref 0–199)
VLDL CALC: 21 mg/dL (ref 0–50)

## 2019-07-22 LAB — THYROID STIMULATING HORMONE (SENSITIVE TSH): TSH: 72.881 u[IU]/mL — ABNORMAL HIGH (ref 0.450–5.330)

## 2019-07-22 NOTE — H&P (Signed)
[facility] - Internal Medicine   [address] [place] [address] [address]  Dept: [phone]  Dept Fax: [phone]    [name]  31-Mar-1935  [id number]    Date of Service: 07/22/2019  8:45 AM EST    Chief complaint:   Chief Complaint   Patient presents with   . Follow Up 6 Months     Gastroesophageal reflux disease, esophagitis presence not specified       HPI:     This is a case of a 85 y.o. year old female who comes in today for six-month follow-up.  No new surgeries.  No new medicines.  No new allergies to medicines.  Remains physically mentally active.  Appetite been fair.  Weight is down a couple more lb.  We discussed that.  Remains very physically mentally active.  Compliant with the COVID-19 precautions.  Patient is planning on getting vaccine.    ROS:    CARDIAC:  No chest pain, DOE, or palpitations. No orthopnea or PND.    PULMONARY:  No cough, sputum, or hemoptysis. No fever,chills, or night sweats.    GI:  No nausea or vomiting. No abdominal pain. No change in bowel habits          No melena or bright red rectal bleeding.    GU :  Voiding without difficulty     NEURO: No headache , diplopia or loss of function of limbs.    Current Outpatient Medications   Medication Sig   . atorvastatin (LIPITOR) 10 mg Oral Tablet    . Ibuprofen (MOTRIN) 800 mg Oral Tablet Take 1 Tab (800 mg total) by mouth Three times a day as needed for Pain   . lactulose (ENULOSE) 10 gram/15 mL Oral Solution Take 15 mL by mouth Once a day   . levothyroxine (SYNTHROID) 25 mcg Oral Tablet Take 1 Tab (25 mcg total) by mouth Every morning   . omeprazole (PRILOSEC) 40 mg Oral Capsule, Delayed Release(E.C.) Take 1 Cap (40 mg total) by mouth Once a day (Patient not taking: Reported on 12/24/2018)   . pravastatin (PRAVACHOL) 10 mg Oral Tablet Take 1 Tab (10 mg total) by mouth Every evening       Objective:     BP 132/78 (Site: Left, Patient Position: Sitting, Cuff Size: Adult)   Pulse 84   Resp 18   Ht 1.626 m  (5\' 4" )   Wt (!) 35.6 kg (78 lb 6.4 oz)   LMP  (LMP Unknown)   SpO2 95%   BMI 13.46 kg/m       General appearance: alert, oriented x 3, in her normal state, cooperative, not in apparent distress, appearing stated age   Neck: No lymphadenopathy or thyroid anomalies noted, carotids 2 + bilaterally without bruit  Lungs: clear to auscultation bilaterally. No crackles or wheeze noted. Normal respiratory effort  Heart: No JVD. Regular rate and rhythm, S1, S2 normal.  No gallop or rub. No murmur  Abdomen: soft, non-tender. Bowel sounds normal. No hepatosplenomegaly.  Extremities: no cyanosis or edema.  Pulses intact.  No calf pain    Assessment/Plan     ENCOUNTER DIAGNOSES     ICD-10-CM   1. Acquired hypothyroidism .  No signs or symptoms of higher low thyroid.  Weight loss however noted.  Will check free T4 and TSH.  Make adjustments in medicines if needed E03.9   2. Mixed hyperlipidemia .  Check lipid panel on medication.  Optimize  medications but do not restrict caloric intake in light of patient's weight loss E78.2   3. Weight loss .  Weight loss.  Discussed supplementation.  Discussed eating 1:00 a.m. says it is time to eat as opposed to when she is hungry.  Discussed not being overly cautious with cholesterol in light of her weight. R63.4                 Orders Placed This Encounter   . CBC   . COMPREHENSIVE METABOLIC PANEL, NON-FASTING   . LIPID PANEL   . THYROID STIMULATING HORMONE (SENSITIVE TSH)   . THYROXINE, FREE (FREE T4)   . URINALYSIS WITH REFLEX MICROSCOPIC AND CULTURE IF POSITIVE         The patient was given ample opportunity to ask questions and those questions were answered to the patient's satisfaction. The patient was encouraged to be involved in their own care, and all diagnoses, medications, and medication side-effects were discussed.  A copy of the patient's medication list was printed and given to the patient. A good faith effort was made to reconcile the patient's medications.  The patient  was told to contact me with any additional questions or concerns, or go to the ED in an emergency.     Follow up: Return in about 6 months (around 01/19/2020).    This note was partially generated using MModal Fluency Direct system, and there may be some incorrect words, spellings, and punctuation that were not noted in checking the note before saving.    [name], M.D.    [facility] - Internal Medicine   [address] [place] [address] [address]  Dept: [phone]  Dept Fax: [phone]

## 2019-07-22 NOTE — Telephone Encounter (Signed)
Thank you page.  It is Synthroid 50 mcg dispense 30. One p.o. q.day.  11 refill

## 2019-07-22 NOTE — Telephone Encounter (Signed)
Increase insulin to 50 mcg.  Dispense 30. One p.o. q.day.  eleven refills.  Recheck free T4 and TSH in 4 weeks

## 2019-07-22 NOTE — Telephone Encounter (Signed)
-----   Message from Marylee Floras, MD sent at 07/22/2019  1:19 PM EST -----  Please call patient and informed that all labs are within acceptable limits.  Thyroid however very under active.  Please verify the patient is taking her Synthroid and if so what the dose of that medicine is.  Marylee Floras, MD

## 2019-07-22 NOTE — Nursing Note (Signed)
patient is taking medication in the morning as directed, 

## 2019-07-24 LAB — URINE CULTURE,ROUTINE: URINE CULTURE: NO GROWTH

## 2019-07-25 ENCOUNTER — Other Ambulatory Visit: Payer: Self-pay | Admitting: Interventional Radiology

## 2019-07-25 DIAGNOSIS — N2889 Other specified disorders of kidney and ureter: Secondary | ICD-10-CM

## 2019-07-25 MED ORDER — LEVOTHYROXINE 50 MCG TABLET
50.00 ug | ORAL_TABLET | Freq: Every morning | ORAL | 4 refills | Status: DC
Start: 2019-07-25 — End: 2020-08-30

## 2019-08-17 ENCOUNTER — Other Ambulatory Visit: Payer: Self-pay

## 2019-08-17 ENCOUNTER — Ambulatory Visit (INDEPENDENT_AMBULATORY_CARE_PROVIDER_SITE_OTHER): Payer: Medicare Other | Admitting: Addiction (Substance Use Disorder)

## 2019-08-17 ENCOUNTER — Encounter: Payer: Self-pay | Admitting: Addiction (Substance Use Disorder)

## 2019-08-17 DIAGNOSIS — F331 Major depressive disorder, recurrent, moderate: Secondary | ICD-10-CM
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
## 2019-08-24 ENCOUNTER — Telehealth (HOSPITAL_BASED_OUTPATIENT_CLINIC_OR_DEPARTMENT_OTHER): Payer: Self-pay | Admitting: Internal Medicine

## 2019-08-24 MED ORDER — FLUCONAZOLE 150 MG TABLET
150.00 mg | ORAL_TABLET | Freq: Once | ORAL | 1 refills | Status: AC
Start: 2019-08-24 — End: 2019-08-24

## 2019-08-24 NOTE — Telephone Encounter (Signed)
[name] called and stated she has a yeast infection. She is requesting her diflucan to be sent to her pharmacy.  Delfin Gant, MA  08/24/2019, 11:51

## 2019-08-24 NOTE — Telephone Encounter (Signed)
Diflucan.  150 mg.  Dispense 1.  One p.o. today.  One refill.  May repeat in 1 week if needed

## 2019-08-26 ENCOUNTER — Other Ambulatory Visit (HOSPITAL_BASED_OUTPATIENT_CLINIC_OR_DEPARTMENT_OTHER): Payer: Self-pay | Admitting: Internal Medicine

## 2019-08-26 MED ORDER — TOBRAMYCIN 0.3 % EYE DROPS
2.00 [drp] | OPHTHALMIC | 0 refills | Status: AC
Start: 2019-08-26 — End: 2019-09-02

## 2019-08-29 ENCOUNTER — Other Ambulatory Visit: Payer: Self-pay

## 2019-08-29 ENCOUNTER — Ambulatory Visit (INDEPENDENT_AMBULATORY_CARE_PROVIDER_SITE_OTHER): Payer: Medicare Other | Admitting: Psychiatry

## 2019-08-29 ENCOUNTER — Encounter: Payer: Self-pay | Admitting: Psychiatry

## 2019-08-29 VITALS — BP 156/93 | HR 61 | Ht 62.0 in | Wt 132.0 lb

## 2019-08-29 DIAGNOSIS — F331 Major depressive disorder, recurrent, moderate: Secondary | ICD-10-CM

## 2019-08-29 DIAGNOSIS — F411 Generalized anxiety disorder: Secondary | ICD-10-CM | POA: Diagnosis not present

## 2019-08-29 MED ORDER — SERTRALINE HCL 100 MG PO TABS: 100.0000 mg | ORAL_TABLET | Freq: Every day | ORAL | 1 refills | Status: DC
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
## 2019-09-26 ENCOUNTER — Encounter: Payer: Self-pay | Admitting: Psychiatry

## 2019-09-26 ENCOUNTER — Ambulatory Visit (INDEPENDENT_AMBULATORY_CARE_PROVIDER_SITE_OTHER): Payer: Medicare Other | Admitting: Psychiatry

## 2019-09-26 DIAGNOSIS — F331 Major depressive disorder, recurrent, moderate: Secondary | ICD-10-CM | POA: Diagnosis not present

## 2019-09-26 DIAGNOSIS — F411 Generalized anxiety disorder: Secondary | ICD-10-CM

## 2019-09-26 MED ORDER — BUPROPION HCL ER (XL) 150 MG PO TB24: 150.0000 mg | ORAL_TABLET | Freq: Every day | ORAL | 1 refills | Status: DC

## 2019-09-26 MED ORDER — SERTRALINE HCL 50 MG PO TABS: 75.0000 mg | ORAL_TABLET | Freq: Every day | ORAL | 1 refills | Status: DC

## 2019-09-26 NOTE — Progress Notes (Signed)
Linda Sanders [id number] 08-06-1934 85 y.o.  Virtual Visit via Telephone Note  I connected with pt on 09/26/19 at  2:30 PM EDT by telephone and verified that I am speaking with the correct person using two identifiers.   I discussed the limitations, risks, security and privacy concerns of performing an evaluation and management service by telephone and the availability of in person appointments. I also discussed with the patient that there may be a patient responsible charge related to this service. The patient expressed understanding and agreed to proceed.   I discussed the assessment and treatment plan with the patient. The patient was provided an opportunity to ask questions and all were answered. The patient agreed with the plan and demonstrated an understanding of the instructions.   The patient was advised to call back or seek an in-person evaluation if the symptoms worsen or if the condition fails to improve as anticipated.  I provided 35 minutes of non-face-to-face time during this encounter.  The patient was located at home.  The provider was located at [place].   Thayer Headings, PMHNP   Subjective:   Patient ID:  Linda Sanders is a 85 y.o. (DOB Nov 06, 1934) female.  Chief Complaint:  Chief Complaint  Patient presents with  . Depression  . Anxiety    HPI Linda Sanders presents for follow-up of depression and anxiety. She reports that she is noticing some worsening in energy and motivation. She reports that her hygiene has declined. She reports that she is not leaving home very often. She made cookies for her daughter with cancer and weight loss and then could not bring them to her and had said she would come on 3 different days. "I start out with an initiative and then I poop out before I can accomplish it." She reports that her mood has been depressed. She reports that her anxiety is the same or worse. She reports continued worry and feeling  overwhelmed. She reports that her sleep is slightly improved. Continues to awaken every 2 hours to use the bathroom.  Awakens at 7 am and cannot get up and will keep falling back to sleep and awaken hourly. Appetite has been "fair to good." She has been eating more sweets and potato chips. She is looking forward to getting a Chief Executive Officer. She reports thinking that her children would be better off without her and feels that she cannot find a purpose. Excessive guilt. Denies SI.   She reports poor concentration and has not been able to read or prepare her taxes.   Past Psychiatric Medication Trials: Remeron- Reports that her children said that she did not seem to be herself.  Zoloft- Has been taking at least one year. Started dozing off after increase to 75 mg po qd. Has helped with falling asleep. Has helped some with depression.  Lexapro Prozac- Took in 2012.  Effexor XR Cymbalta- Adverse reaction Wellbutrin XL- Cannot recall why it was stopped.  Ambien- Effective.  Xanax- Took short-term after surgery for breast cancer.  Valium- grogginess Gabapentin- Helps with anxiety, RLS, and pain/neuropathy.  Review of Systems:  Review of Systems  HENT: Positive for congestion and postnasal drip.   Eyes: Positive for visual disturbance.       Had floater in right eye  Musculoskeletal: Negative for gait problem.  Allergic/Immunologic: Positive for environmental allergies.  Neurological: Positive for headaches. Negative for tremors.  Psychiatric/Behavioral:       Please refer to HPI  Medications: I have reviewed the patient's current medications.  Current Outpatient Medications  Medication Sig Dispense Refill  . acetaminophen (TYLENOL) 325 MG tablet Take 650 mg by mouth every 6 (six) hours as needed for mild pain.     Marland Kitchen aspirin 81 MG chewable tablet Chew by mouth daily.    Marland Kitchen buPROPion (WELLBUTRIN XL) 150 MG 24 hr tablet Take 1 tablet (150 mg total) by mouth daily. 30 tablet 1  .  calcium carbonate (TUMS - DOSED IN MG ELEMENTAL CALCIUM) 500 MG chewable tablet Chew 1 tablet by mouth daily as needed for indigestion or heartburn.    . carvedilol (COREG) 12.5 MG tablet Take 25 mg by mouth 2 (two) times daily.     . cetirizine (ZYRTEC) 10 MG tablet Take 10 mg by mouth daily.    . Cholecalciferol (VITAMIN D3) 50000 UNITS CAPS Take 1 capsule by mouth once a week. No specific day    . gabapentin (NEURONTIN) 400 MG capsule Take 400 mg by mouth 3 (three) times daily.     Marland Kitchen LACTOBACILLUS RHAMNOSUS, GG, PO Take by mouth.    . levothyroxine (SYNTHROID, LEVOTHROID) 50 MCG tablet Take 1.5 tablets (75 mcg total) by mouth daily. (Patient taking differently: Take 50 mcg by mouth daily. ) 60 tablet 0  . Melatonin 5 MG TABS Take by mouth at bedtime as needed.    . predniSONE (DELTASONE) 5 MG tablet Take 5 mg by mouth daily.     Marland Kitchen rOPINIRole (REQUIP) 3 MG tablet Take 3 mg by mouth at bedtime. Take 2 tablets by mouth every evening at bedtime.     . sertraline (ZOLOFT) 50 MG tablet Take 1.5 tablets (75 mg total) by mouth daily. 45 tablet 1  . traMADol (ULTRAM) 50 MG tablet Take 50 mg by mouth 2 (two) times daily. Taking every day per patient     No current facility-administered medications for this visit.    Medication Side Effects: None  She reports that she may be more tired.   Allergies:  Allergies  Allergen Reactions  . Augmentin [Amoxicillin-Pot Clavulanate] Nausea Only  . Avelox [Moxifloxacin Hcl In Nacl]     Upset stomach   . Clarithromycin     Upset stomach   . Codeine     Vomiting   . Cymbalta [Duloxetine Hcl]     Blurred vision   . Metronidazole And Related     Rash, itching   . Moxifloxacin     Upset stomach   . Amoxicillin-Pot Clavulanate Nausea And Vomiting  . Clarithromycin Diarrhea    Past Medical History:  Diagnosis Date  . 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 Hinton Rao.  s/p partial mastectomy, left breast xrt, chemo  . Allergic rhinitis   . Arthritis     "joints; right hip" (12/28/2015"  . Blurry vision 06/11/2015  . Chronic bronchitis ([place])   . Diarrhea 06/11/2015  . Ear fullness 06/11/2015  . GERD (gastroesophageal reflux disease)   . Hypertension   . Hypothyroidism   . Multiple allergies 03/29/2015  . 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 Nausea without vomiting 06/11/2015  . Pneumonia "several times"  . Polymyositis ([place])    Dr. Justine Null  . Pulmonary nodule   . Skull lesion 03/29/2015    Family History  Problem Relation Age of Onset  . Allergies Mother   . Rheum arthritis Mother   . Heart attack Mother   . Allergies Other        children  .  Lymphoma Sister   . Schizophrenia Sister   . Alcohol abuse Father   . Cancer Sister   . Pancreatic cancer Daughter   . Liver cancer Daughter   . Depression Daughter     Social History   Socioeconomic History  . Marital status: Widowed    Spouse name: Not on file  . Number of children: Y  . Years of education: Not on file  . Highest education level: Not on file  Occupational History  . Occupation: retired from office work  Tobacco Use  . Smoking status: Never Smoker  . Smokeless tobacco: Never Used  Substance and Sexual Activity  . Alcohol use: No  . Drug use: No  . Sexual activity: Not on file  Other Topics Concern  . Not on file  Social History Narrative  . Not on file   Social Determinants of Health   Financial Resource Strain:   . Difficulty of Paying Living Expenses:   Food Insecurity:   . Worried About Charity fundraiser in the Last Year:   . Arboriculturist in the Last Year:   Transportation Needs:   . Film/video editor (Medical):   Marland Kitchen Lack of Transportation (Non-Medical):   Physical Activity:   . Days of Exercise per Week:   . Minutes of Exercise per Session:   Stress:   . Feeling of Stress :   Social Connections:   . Frequency of Communication with Friends and Family:   . Frequency of Social Gatherings with Friends and Family:   . Attends  Religious Services:   . Active Member of Clubs or Organizations:   . Attends Archivist Meetings:   Marland Kitchen Marital Status:   Intimate Partner Violence:   . Fear of Current or Ex-Partner:   . Emotionally Abused:   Marland Kitchen Physically Abused:   . Sexually Abused:     Past Medical History, Surgical history, Social history, and Family history were reviewed and updated as appropriate.   Please see review of systems for further details on the patient's review from today.   Objective:   Physical Exam:  There were no vitals taken for this visit.  Physical Exam Neurological:     Mental Status: She is alert and oriented to person, place, and time.     Cranial Nerves: No dysarthria.  Psychiatric:        Attention and Perception: Attention and perception normal.        Mood and Affect: Mood is anxious and depressed.        Speech: Speech normal.        Behavior: Behavior is cooperative.        Thought Content: Thought content normal. Thought content is not paranoid or delusional. Thought content does not include homicidal or suicidal ideation. Thought content does not include homicidal or suicidal plan.        Cognition and Memory: Cognition and memory normal.        Judgment: Judgment normal.     Comments: Insight intact     Lab Review:     Component Value Date/Time   NA 131 (L) 12/29/2015 0450   K 4.4 12/29/2015 0450   CL 99 (L) 12/29/2015 0450   CO2 24 12/29/2015 0450   GLUCOSE 89 12/29/2015 0450   BUN 8 12/29/2015 0450   CREATININE 0.87 12/29/2015 0450   CALCIUM 9.3 12/29/2015 0450   PROT 5.9 (L) 12/29/2015 0450   ALBUMIN 2.7 (L) 12/29/2015 0450  AST 24 12/29/2015 0450   ALT 23 12/29/2015 0450   ALKPHOS 68 12/29/2015 0450   BILITOT 0.3 12/29/2015 0450   GFRNONAA >60 12/29/2015 0450   GFRAA >60 12/29/2015 0450       Component Value Date/Time   WBC 9.6 12/29/2015 0450   RBC 3.31 (L) 12/29/2015 0450   HGB 10.3 (L) 12/29/2015 0450   HCT 33.2 (L) 12/29/2015 0450   PLT  267 12/29/2015 0450   MCV 100.3 (H) 12/29/2015 0450   MCH 31.1 12/29/2015 0450   MCHC 31.0 12/29/2015 0450   RDW 13.7 12/29/2015 0450   LYMPHSABS 1.4 12/29/2015 0450   MONOABS 1.3 (H) 12/29/2015 0450   EOSABS 0.3 12/29/2015 0450   BASOSABS 0.0 12/29/2015 0450    No results found for: POCLITH, LITHIUM   No results found for: PHENYTOIN, PHENOBARB, VALPROATE, CBMZ   .res Assessment: Plan:   Decrease Sertraline to 75 mg po qd since she pt reports possible worsening mood and anxiety s/s and increased fatigue at 100 mg. Discussed potential benefits, risks, and side effects of Wellbutrin XL for depression and pt agrees to re-trial of Wellbutrin XL since she was not on Sertraline when she took Wellbutrin XL in the past.  Pt to f/u in 4 weeks or sooner if clinically indicated. Patient advised to contact office with any questions, adverse effects, or acute worsening in signs and symptoms.  Linda Sanders was seen today for depression and anxiety.  Diagnoses and all orders for this visit:  Major depressive disorder, recurrent episode, moderate (HCC) -     sertraline (ZOLOFT) 50 MG tablet; Take 1.5 tablets (75 mg total) by mouth daily. -     Discontinue: buPROPion (WELLBUTRIN XL) 150 MG 24 hr tablet; Take 1 tablet (150 mg total) by mouth daily. -     buPROPion (WELLBUTRIN XL) 150 MG 24 hr tablet; Take 1 tablet (150 mg total) by mouth daily.  Generalized anxiety disorder -     sertraline (ZOLOFT) 50 MG tablet; Take 1.5 tablets (75 mg total) by mouth daily.    Please see After Visit Summary for patient specific instructions.  No future appointments.  No orders of the defined types were placed in this encounter.     -------------------------------

## 2019-10-10 ENCOUNTER — Telehealth: Payer: Self-pay | Admitting: Psychiatry

## 2019-10-10 DIAGNOSIS — F331 Major depressive disorder, recurrent, moderate: Secondary | ICD-10-CM

## 2019-10-10 MED ORDER — ARIPIPRAZOLE 2 MG PO TABS: 2.0000 mg | ORAL_TABLET | Freq: Every day | ORAL | 0 refills | Status: DC

## 2019-10-10 NOTE — Telephone Encounter (Signed)
She reports that she did not re-start Wellbutrin XL since she found notes that she had adverse effects with Wellbutrin XL. Continues to take Sertraline 75 mg po qd.   She reports that depression seems to be getting worse. Reports that she has not been wanting to brush her teeth or take a bath.   She reports that anxiety and worry have been about the same. Denies SI. She reports that she is questioning her purpose.   Discussed potential benefits, risks, and side effects of Abilify. Discussed potential metabolic side effects associated with atypical antipsychotics, as well as potential risk for movement side effects. Advised pt to contact office if movement side effects occur.   Patient advised to contact office with any questions, adverse effects, or acute worsening in signs and symptoms.    Past Psychiatric Medication Trials: Remeron- Reports that her children said that she did not seem to be herself.  Zoloft- Has been taking at least one year. Started dozing off after increase to 75 mg po qd. Has helped with falling asleep. Has helped some with depression.  Lexapro Prozac- Took in 2012. Ineffective. Effexor XR- Cannot recall response Cymbalta- Adverse reaction Wellbutrin XL- Cannot recall why it was stopped.  Ambien- Effective.  Xanax- Took short-term after surgery for breast cancer.  Valium- grogginess Gabapentin- Helps with anxiety, RLS, and pain/neuropathy.

## 2019-10-10 NOTE — Telephone Encounter (Signed)
Pt called and wanted to let J.Eulas Post know she cannot take [facility]. Pt states its in her past history. Pt is asking for a return call. [phone]

## 2019-10-31 ENCOUNTER — Other Ambulatory Visit: Payer: Self-pay

## 2019-10-31 ENCOUNTER — Ambulatory Visit (INDEPENDENT_AMBULATORY_CARE_PROVIDER_SITE_OTHER): Payer: Medicare Other | Admitting: Psychiatry

## 2019-10-31 ENCOUNTER — Encounter: Payer: Self-pay | Admitting: Psychiatry

## 2019-10-31 DIAGNOSIS — F331 Major depressive disorder, recurrent, moderate: Secondary | ICD-10-CM | POA: Diagnosis not present

## 2019-10-31 DIAGNOSIS — F411 Generalized anxiety disorder: Secondary | ICD-10-CM | POA: Diagnosis not present

## 2019-10-31 MED ORDER — ARIPIPRAZOLE 5 MG PO TABS: 5.0000 mg | ORAL_TABLET | Freq: Every day | ORAL | 1 refills | Status: DC

## 2019-10-31 MED ORDER — SERTRALINE HCL 50 MG PO TABS: 75.0000 mg | ORAL_TABLET | Freq: Every day | ORAL | 1 refills | Status: DC
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
## 2019-11-15 ENCOUNTER — Ambulatory Visit (INDEPENDENT_AMBULATORY_CARE_PROVIDER_SITE_OTHER): Payer: Medicare Other | Admitting: Pulmonary Disease

## 2019-11-15 ENCOUNTER — Telehealth: Payer: Self-pay | Admitting: Emergency Medicine

## 2019-11-15 ENCOUNTER — Encounter: Payer: Self-pay | Admitting: Pulmonary Disease

## 2019-11-15 DIAGNOSIS — A310 Pulmonary mycobacterial infection: Secondary | ICD-10-CM

## 2019-11-15 DIAGNOSIS — J479 Bronchiectasis, uncomplicated: Secondary | ICD-10-CM | POA: Diagnosis not present

## 2019-11-15 DIAGNOSIS — R05 Cough: Secondary | ICD-10-CM

## 2019-11-15 DIAGNOSIS — R053 Chronic cough: Secondary | ICD-10-CM

## 2019-11-15 NOTE — Assessment & Plan Note (Signed)
Likely bronchiectasis flare Patient has not been using flutter valve Difficult to fully ascertain over a telephonic phone visit Patient has sputum cups at home, I have encouraged her to produce a sputum sample I will also encourage for her to have an in office evaluation so we can appropriately manage as well as decide on repeat imaging such as an x-ray

## 2019-11-15 NOTE — Patient Instructions (Addendum)
You were seen today by Lauraine Rinne, NP  for:   1. Bronchiectasis without complication (HCC)  - AFB Culture & Smear; Future - Fungus Culture & Smear; Future - Respiratory or Resp and Sputum Culture; Future  Bronchiectasis: This is the medical term which indicates that you have damage, dilated airways making you more susceptible to respiratory infection. Use a flutter valve 10 breaths twice a day or 4 to 5 breaths 4-5 times a day to help clear mucus out Let us know if you have cough with change in mucus color or fevers or chills.  At that point you would need an antibiotic. Maintain a healthy nutritious diet, eating whole foods Take your medications as prescribed   2. Chronic cough  Continue daily Claritin  GERD management: >>>Avoid laying flat until 2 hours after meals >>>Elevate head of the bed including entire chest >>>Reduce size of meals and amount of fat, acid, spices, caffeine and sweets >>>If you are smoking, Please stop! >>>Decrease alcohol consumption >>>Work on maintaining a healthy weight with normal BMI     3. Mycobacterium avium complex ([place])  We recommend that he proceed forward with obtaining sputum cultures   We recommend today:  Orders Placed This Encounter  Procedures  . AFB Culture & Smear    Standing Status:   Future    Standing Expiration Date:   11/14/2020  . Fungus Culture & Smear    Standing Status:   Future    Standing Expiration Date:   11/14/2020  . Respiratory or Resp and Sputum Culture    Standing Status:   Future    Standing Expiration Date:   11/14/2020   Orders Placed This Encounter  Procedures  . AFB Culture & Smear  . Fungus Culture & Smear  . Respiratory or Resp and Sputum Culture   No orders of the defined types were placed in this encounter.   Follow Up:    Return in about 1 week (around 11/22/2019), or if symptoms worsen or fail to improve, for Follow up with Wyn Quaker FNP-C.   Please do your part to reduce the spread of  COVID-19:      Reduce your risk of any infection  and COVID19 by using the similar precautions used for avoiding the common cold or flu:  Marland Kitchen Wash your hands often with soap and warm water for at least 20 seconds.  If soap and water are not readily available, use an alcohol-based hand sanitizer with at least 60% alcohol.  . If coughing or sneezing, cover your mouth and nose by coughing or sneezing into the elbow areas of your shirt or coat, into a tissue or into your sleeve (not your hands). Langley Gauss A MASK when in public  . Avoid shaking hands with others and consider head nods or verbal greetings only. . Avoid touching your eyes, nose, or mouth with unwashed hands.  . Avoid close contact with people who are sick. . Avoid places or events with large numbers of people in one location, like concerts or sporting events. . If you have some symptoms but not all symptoms, continue to monitor at home and seek medical attention if your symptoms worsen. . If you are having a medical emergency, call 911.   [place] / e-Visit: [email or web address]         [facility]: [facility]: [phone]  [facility]: [phone]     It is flu season:   >>> Best ways to protect herself from the flu: Receive the yearly flu vaccine, practice good hand hygiene washing with soap and also using hand sanitizer when available, eat a nutritious meals, get adequate rest, hydrate appropriately   Please contact the office if your symptoms worsen or you have concerns that you are not improving.   Thank you for choosing [facility] for your healthcare, and for allowing [place] to partner with you on your healthcare journey. I am thankful to be able to provide care to you today.   Wyn Quaker FNP-C

## 2019-11-15 NOTE — Telephone Encounter (Signed)
Called and spoke with pt explaining to her when RB's first avail appt was and stated to her that we could get her scheduled for a visit with NP and she verbalized understanding. Due to pt having fevers off and on, I have scheduled pt a virtual visit today at 1:30. Nothing further needed.

## 2019-11-15 NOTE — Assessment & Plan Note (Addendum)
Not using flutter valve  Doesn't feel it could help  2017 - sputum positive for MAC, treatment deferred  Plan: Resume flutter valve use Present to our office for an in office evaluation Orders put in for sputum cultures

## 2019-11-15 NOTE — Assessment & Plan Note (Signed)
Previous positive cultures in 2017 Treatment was deferred by patient  Plan: Patient to produce sputum sample and drop off at [facility] lab

## 2019-11-15 NOTE — Progress Notes (Signed)
Virtual Visit via Telephone Note  I connected with Linda Sanders on 11/15/19 at  1:30 PM EDT by telephone and verified that I am speaking with the correct person using two identifiers.  Location: [name], [name], [name], [name]   I discussed the limitations, risks, security and privacy concerns of performing an evaluation and management service by telephone and the availability of in person appointments. I also discussed with the patient that there may be a patient responsible charge related to this service. The patient expressed understanding and agreed to proceed.  Patient consented to consult via telephone: Yes People present and their role in pt care: Pt     History of Present Illness:  85 year old female never smoker followed in our office for bronchiectasis, pulmonary nodules, chronic cough  Past medical history: Hypertension, polymyositis, history of Mycobacterium avium complex, fibromyalgia Smoking history: Never smoker Maintenance: Prednisone 34m  Patient of Dr. BLamonte Sakai Chief complaint: Acute - Cough, fevers    85year old female never smoker followed in our office for MAC, bronchiectasis, chronic cough, pulmonary nodules.  She is followed by Dr. BLamonte Sakai  Last seen in our office in May/2019 by Dr. BLamonte Sakai  At that time patient was trialed on Symbicort she was not found to have much benefit.  At that time she was continued to have a nonproductive cough they were unable to obtain a sputum culture.  Patient was against chronic antibiotic use.  She continued to have a nonproductive cough and was encouraged to remain on Claritin as well as take Tums intermittently.  Patient was encouraged to follow-up in 6 months.  This was never completed.  Patient scheduled for a virtual acute visit with our office today.  Patient reporting that she is having a productive cough.  She is unsure when this is worsened.  She  believes this has been a progressive increase over the past couple of months.  Her cough is also productive.  She has not had any recent antibiotics.  She reports that she has had recent fevers.  She reports that her fevers were 97.7.  She reports that her body temperature typically runs lower around 96 degrees.  So 97.7 or 98 is mildly high for her.  Patient has not had any recent sick contacts.  Patient received the Covid vaccine in February/2021.   Observations/Objective:  11/14/19 - temp - 97.7  12/29/2015-AFB-positive for Mycobacterium avium complex Susceptibility testing done in June/2017  07/02/2017-chest x-ray-chronic interstitial and alveolar opacities compatible with MAC infection no acute abnormalities  Social History   Tobacco Use  Smoking Status Never Smoker  Smokeless Tobacco Never Used   Immunization History  Administered Date(s) Administered  . Influenza-Unspecified 04/21/2014, 05/11/2015, 05/13/2017  . Pneumococcal-Unspecified 04/25/2013, 01/16/2014   Assessment and Plan:  Discussion: Patient is likely having a bronchiectatic flare.  Difficult to manage telephonically.  I would like for the patient to produce a sputum sample before treatment.  I believe this is reasonable given the fact that she is not having actually elevated temperatures.  Most recent temperature was 97.7 last night.  Patient was able to talk to me in full and complete sentences.  No audible wheezing.  I do believe the patient should be seen soon in our office if we have not seen the patient since 2019.  I have encouraged her to produce sputum samples that way we can further investigate with a respiratory sputum culture, AFB as well as  fungal.  I have also encouraged her to resume her flutter valve use.  She has not been using this.  Patient reports that she will have to coordinate follow-up with our office because it would require a ride.  Bronchiectasis ([place]) Not using flutter valve  Doesn't feel it  could help  2017 - sputum positive for MAC, treatment deferred  Plan: Resume flutter valve use Present to our office for an in office evaluation Orders put in for sputum cultures   Chronic cough Likely bronchiectasis flare Patient has not been using flutter valve Difficult to fully ascertain over a telephonic phone visit Patient has sputum cups at home, I have encouraged her to produce a sputum sample I will also encourage for her to have an in office evaluation so we can appropriately manage as well as decide on repeat imaging such as an x-ray   Mycobacterium avium complex ([place]) Previous positive cultures in 2017 Treatment was deferred by patient  Plan: Patient to produce sputum sample and drop off at [facility] lab  Follow Up Instructions:  Return in about 1 week (around 11/22/2019), or if symptoms worsen or fail to improve, for Follow up with Wyn Quaker FNP-C.   I discussed the assessment and treatment plan with the patient. The patient was provided an opportunity to ask questions and all were answered. The patient agreed with the plan and demonstrated an understanding of the instructions.   The patient was advised to call back or seek an in-person evaluation if the symptoms worsen or if the condition fails to improve as anticipated.  I provided 25 minutes of non-face-to-face time during this encounter.   Lauraine Rinne, NP

## 2019-11-16 ENCOUNTER — Telehealth: Payer: Self-pay | Admitting: Pulmonary Disease

## 2019-11-16 NOTE — Telephone Encounter (Signed)
Patient had a televisit yesterday with Aaron Edelman. He would like for the patient to follow up in 1 week with a CXR. I called her to get this scheduled but she did not answer. Left detailed message for her to call back.

## 2019-11-17 ENCOUNTER — Telehealth: Payer: Self-pay | Admitting: Psychiatry

## 2019-11-17 NOTE — Telephone Encounter (Signed)
Patient called and left a message asking for Janett Billow to call her or the nurse to go over her medications that she is suppose to take the dosages and when she is to take them. Please give her a call at [phone]

## 2019-11-17 NOTE — Telephone Encounter (Signed)
Advised patient to contact her PCP then follow up with [place] with an update. She agreed.

## 2019-11-22 ENCOUNTER — Other Ambulatory Visit: Payer: Self-pay

## 2019-11-22 ENCOUNTER — Ambulatory Visit (INDEPENDENT_AMBULATORY_CARE_PROVIDER_SITE_OTHER): Payer: Medicare Other | Admitting: Family Medicine

## 2019-11-22 ENCOUNTER — Encounter (INDEPENDENT_AMBULATORY_CARE_PROVIDER_SITE_OTHER): Payer: Self-pay

## 2019-11-22 VITALS — BP 118/70 | HR 66 | Temp 98.6°F | Resp 16 | Ht 60.0 in | Wt 79.4 lb

## 2019-11-22 DIAGNOSIS — L03319 Cellulitis of trunk, unspecified: Secondary | ICD-10-CM

## 2019-11-22 MED ORDER — CEFDINIR 300 MG CAPSULE
300.00 mg | ORAL_CAPSULE | Freq: Two times a day (BID) | ORAL | 0 refills | Status: AC
Start: 2019-11-22 — End: 2019-12-02

## 2019-11-22 NOTE — Progress Notes (Signed)
FAMILY MEDICINE, [address]  [place] [address]       Name: [name] MRN:  [id number]   Date: 11/22/2019 Age: 85 y.o.     [birth year]      Chief Complaint   Patient presents with   . Skin  Lesion     tried to use a sting and remove it itchy    . Redness     warm to touch x 2 week          HPI:  [name] is a 85 y.o. year old female who  Has skin erythema, warmth x 1-2 weeks on trunk. Started after she tried to remove a skin tag with a string    ROS:  +skin erythema, warmth, tenderness  Denies fever, fatigue, headache, nausea, arthralgia    Vitals:    11/22/19 1344   BP: 118/70   Pulse: 66   Resp: 16   Temp: 37 C (98.6 F)   Weight: (!) 36 kg (79 lb 6.4 oz)   Height: 1.524 m (5')   BMI: 15.54        Past Medical History  Current Outpatient Medications   Medication Sig   . cefdinir (OMNICEF) 300 mg Oral Capsule Take 1 Capsule (300 mg total) by mouth Twice daily for 10 days   . Ibuprofen (MOTRIN) 800 mg Oral Tablet Take 1 Tab (800 mg total) by mouth Three times a day as needed for Pain (Patient not taking: Reported on 11/22/2019)   . levothyroxine (SYNTHROID) 50 mcg Oral Tablet Take 1 Tab (50 mcg total) by mouth Every morning     Allergies   Allergen Reactions   . Grass Pollen      Past Medical History:   Diagnosis Date   . Fibromyalgia    . Hypercholesterolemia                Family Medical History:     Problem Relation (Age of Onset)    Hypertension (High Blood Pressure) Mother, Father            Social History     Socioeconomic History   . Marital status: Widowed     Spouse name: Not on file   . Number of children: Not on file   . Years of education: Not on file   . Highest education level: Not on file   Tobacco Use   . Smoking status: Never Smoker   . Smokeless tobacco: Never Used   Substance and Sexual Activity   . Drug use: Never   . Sexual activity: Not Currently     Social Determinants of Health     Financial Resource Strain:    . Difficulty of Paying Living Expenses:    Food  Insecurity:    . Worried About Charity fundraiser in the Last Year:    . Arboriculturist in the Last Year:    Transportation Needs:    . Film/video editor (Medical):    Marland Kitchen Lack of Transportation (Non-Medical):    Physical Activity:    . Days of Exercise per Week:    . Minutes of Exercise per Session:    Stress:    . Feeling of Stress :    Intimate Partner Violence:    . Fear of Current or Ex-Partner:    . Emotionally Abused:    Marland Kitchen Physically Abused:    . Sexually Abused:      Patient  Active Problem List   Diagnosis   . Hypothyroidism   . HLD (hyperlipidemia)   . Vitamin D deficiency   . High risk medication use       PE:   Well nourished and well developed in no acute distress. Affect is normal and appropriate. Mucous pink and moist. Chest is CTA. Heart RRR. Skin erythema, warmth, tenderness left chest thorax lateral of left breast (approx 4-5cm) , no palpable abscess      Assessment:  Rondi was seen today for skin  lesion and redness.    Diagnoses and all orders for this visit:    Cellulitis of trunk, unspecified    Other orders  -     cefdinir (OMNICEF) 300 mg Oral Capsule; Take 1 Capsule (300 mg total) by mouth Twice daily for 10 days         There are no exam notes on file for this visit.     Plan    S[facility] fell off    [facility]    Followup if symptoms worsen, change or persist    Desma Paganini, PA-C

## 2019-12-09 ENCOUNTER — Ambulatory Visit: Payer: Medicare Other | Admitting: Emergency Medicine

## 2019-12-13 ENCOUNTER — Ambulatory Visit: Payer: Medicare Other | Admitting: Psychiatry

## 2020-01-04 ENCOUNTER — Ambulatory Visit (HOSPITAL_BASED_OUTPATIENT_CLINIC_OR_DEPARTMENT_OTHER): Payer: Self-pay | Admitting: Internal Medicine

## 2020-01-19 ENCOUNTER — Encounter (HOSPITAL_BASED_OUTPATIENT_CLINIC_OR_DEPARTMENT_OTHER): Payer: Self-pay | Admitting: Internal Medicine

## 2020-01-19 ENCOUNTER — Other Ambulatory Visit: Payer: Self-pay

## 2020-01-19 ENCOUNTER — Ambulatory Visit: Payer: Medicare Other | Attending: Internal Medicine | Admitting: Internal Medicine

## 2020-01-19 VITALS — BP 116/62 | HR 62 | Resp 18 | Ht 60.0 in | Wt 77.0 lb

## 2020-01-19 DIAGNOSIS — R634 Abnormal weight loss: Secondary | ICD-10-CM

## 2020-01-19 DIAGNOSIS — Z0000 Encounter for general adult medical examination without abnormal findings: Secondary | ICD-10-CM | POA: Insufficient documentation

## 2020-01-19 DIAGNOSIS — E039 Hypothyroidism, unspecified: Secondary | ICD-10-CM | POA: Insufficient documentation

## 2020-01-19 LAB — CBC
HCT: 39.8 % (ref 34.6–46.2)
HGB: 13.6 g/dL (ref 11.8–15.8)
MCH: 30.9 pg (ref 27.6–33.2)
MCHC: 34.3 g/dL (ref 32.6–35.4)
MCV: 90.2 fL (ref 82.3–96.7)
MPV: 8.2 fL (ref 6.6–10.2)
PLATELETS: 221 10*3/uL (ref 140–440)
RBC: 4.41 10*6/uL (ref 3.80–5.24)
RDW: 13.5 % (ref 12.4–15.2)
WBC: 6.7 10*3/uL (ref 3.5–10.3)

## 2020-01-19 LAB — COMPREHENSIVE METABOLIC PANEL, NON-FASTING
ALBUMIN: 4.6 g/dL (ref 3.2–4.6)
ALKALINE PHOSPHATASE: 49 U/L (ref 20–130)
ALT (SGPT): 5 U/L (ref <=52)
ANION GAP: 8 mmol/L
AST (SGOT): 16 U/L (ref <=35)
BILIRUBIN TOTAL: 1.2 mg/dL (ref 0.3–1.2)
BUN/CREA RATIO: 12
BUN: 10 mg/dL (ref 10–25)
CALCIUM: 9.7 mg/dL (ref 8.8–10.3)
CHLORIDE: 101 mmol/L (ref 98–111)
CO2 TOTAL: 29 mmol/L (ref 21–35)
CREATININE: 0.83 mg/dL (ref <=1.30)
ESTIMATED GFR: >60 mL/min/{1.73_m2}
GLUCOSE: 99 mg/dL (ref 70–110)
POTASSIUM: 4.7 mmol/L (ref 3.5–5.0)
PROTEIN TOTAL: 6.8 g/dL (ref 6.0–8.3)
SODIUM: 138 mmol/L (ref 135–145)

## 2020-01-19 LAB — URINALYSIS, MACRO/MICRO
BILIRUBIN: NEGATIVE mg/dL
BLOOD: NEGATIVE mg/dL
COLOR: NORMAL
GLUCOSE: NORMAL mg/dL
KETONES: NEGATIVE mg/dL
LEUKOCYTES: NEGATIVE WBCs/uL
NITRITE: NEGATIVE
PH: 7.5 (ref 5.0–8.0)
PROTEIN: NEGATIVE mg/dL
SPECIFIC GRAVITY: 1.013 (ref 1.005–1.030)
UROBILINOGEN: NORMAL mg/dL

## 2020-01-19 LAB — THYROID STIMULATING HORMONE (SENSITIVE TSH): TSH: 5.888 u[IU]/mL — ABNORMAL HIGH (ref 0.450–5.330)

## 2020-01-19 LAB — LIPID PANEL
CHOLESTEROL: 260 mg/dL — ABNORMAL HIGH (ref 0–199)
HDL CHOL: 81 mg/dL (ref >40)
LDL DIRECT: 156 mg/dL — ABNORMAL HIGH (ref 0–99)
TRIGLYCERIDES: 73 mg/dL (ref 0–199)
VLDL CALC: 15 mg/dL (ref 0–50)

## 2020-01-19 LAB — THYROXINE, FREE (FREE T4): THYROXINE (T4), FREE: 1.17 ng/dL — ABNORMAL HIGH (ref 0.61–1.12)

## 2020-01-19 MED ORDER — IBUPROFEN 800 MG TABLET
800.00 mg | ORAL_TABLET | Freq: Three times a day (TID) | ORAL | 3 refills | Status: DC | PRN
Start: 2020-01-19 — End: 2020-08-30

## 2020-01-19 NOTE — Nursing Note (Signed)
01/19/20 0800   Medicare Wellness Assessment   Medicare initial or wellness physical in the last year? No   Advance Directives   Does patient have a living will or MPOA no   Has patient provided [employer] with a copy? no   Advance directive information given to the patient today? no   Activities of Daily Living   Do you need help with dressing, bathing, or walking? No   Do you need help with shopping, housekeeping, medications, or finances? Yes   Do you have rugs in hallways, broken steps, or poor lighting? No   Do you have grab bars in your bathroom, non-slip strips in your tub, and hand rails on your stairs? Yes   Urinary Incontinence Screen-Women only   Do you ever leak urine when you don't want to? YES   Cognitive Function Screen   What is you age? 1   What is the time to the nearest hour? 1   What is the year? 1   What is the name of this clinic? 1   Can the patient recognize two persons (the doctor, the nurse, home help, etc.)? 1   What is the date of your birth? (day and month sufficient)  1   In what year did World War II end? 1   Who is the current president of the [place]? 1   Count from 20 down to 1? 1   What address did I give you earlier? 1   Total Score 10   Depression Screen   Little interest or pleasure in doing things. 0   Feeling down, depressed, or hopeless 1   PHQ 2 Total 1   Hearing Screen   Have you noticed any hearing difficulties? Yes   Fall Risk Assessment   Do you feel unsteady when standing or walking? Yes   Do you worry about falling? Yes   Have you fallen in the past year? No   Timed up and go test (in seconds) 5

## 2020-01-19 NOTE — Patient Instructions (Signed)
Medicare Preventive Services  Medicare coverage information Recommendation for YOU   Heart Disease and Diabetes   Lipid profile Every 5 years or more often if at risk for cardiovascular disease  Last Lipid Panel  (Last result in the past 2 years)      Cholesterol   HDL   LDL   Direct LDL   Triglycerides      07/22/19 0854 [id number]   [id number]         Diabetes Screening  yearly for those at risk for diabetes, 2 tests per year for those with prediabetes Last Glucose: 95    Diabetes Self Management Training or Medical Nutrition Therapy  For those with diabetes, up to 10 hrs initial training within a year, subsequent years up to 2 hrs of follow up training Optional for those with diabetes     Medical Nutrition Therapy Three hours of one-on-one counseling in first year, two hours in subsequent years Optional for those with diabetes, kidney disease   Intensive Behavioral Therapy for Obesity  Face-to-face counseling, first month every week, month 2-6 every other week, month 7-12 every month if continued progress is documented Optional for those with Body Mass Index 30 or higher  Your There is no height or weight on file to calculate BMI.   Tobacco Cessation (Quitting) Counseling   Two attempts per year, max 4 sessions per attempt, up to 8 per year, for those with tobacco-related health condition Optional for those that use tobacco   Cancer Screening   Colorectal screening   For anyone age 95 to 77 or any age if high risk:  . Screening Colonoscopy every 10 yrs if low risk,  more frequent if higher risk  OR  . Flexible  Sigmoidoscopy  every 5 yr OR  . Fecal Occult Blood Testing yearly OR  . Cologuard Stool DNA test once every 3 years OR  . CT Colonography every 5 yrs    See your schedule below   Screening Pap Test Recommended every 3 years for all women age 69 to 80, or every five years if combined with HPV test (routine screening not needed after total hysterectomy).  Medicare covers every 2 years, up to yearly if high risk.   Screening Pelvic Exam Medicare covers every 2 years, yearly if high risk or childbearing age with abnormal Pap in last 3 yrs. See your schedule below   Screening Mammogram   Recommended every 1-2 years for women age 23 to 15, and selectively recommended for women between 82-49 based on shared decisions about risk. Covered by Medicare up to every year for women age 67 or older See your schedule below   Lung Cancer Screening  Annual low dose computed tomography (LDCT scan) is recommended for those age 51-77 who smoked 30 pack-years and are current smokers or quit smoking within past 15 years (one pack-year= smoking one PPD for one year), after counseling by your doctor or nurse clinician about the possible benefits or harms See your schedule below   Vaccinations   Pneumococcal Vaccine Recommended routinely age 11+ with two separate vaccines one year apart (Prevnar then Pneumovax).  Recommended before age 76 if medical conditions increase risk  Seasonal Influenza Vaccine Once every flu season   Hepatitis B Vaccine 3 doses if risk (including anyone with diabetes or liver disease)  Shingles Vaccine Once or twice at age 31 or older  Diphtheria Tetanus Pertussis Vaccine ONCE as adult, booster every 10 years  There is no immunization history on file for this patient.  Shingles vaccine and Diphtheria Tetanus Pertussis vaccines are available at pharmacies or local health department without a prescription.   Other Screening   Bone Densitometry   Every 24 months for anyone at risk, including postmenopausal       Glaucoma Screening   Yearly if in high risk group such as diabetes, family history, African American age 43+ or Hispanic American age 27+      Hepatitis C Screening recommended ONCE for those born between 1945-1965, or high risk for HCV infection       HIV Testing recommended routinely at least ONCE, covered every year for age 43 to 28 regardless of risk, and every year for age over 22 who ask for the test or higher  risk  Yearly or up to 3 times in pregnancy         Abdominal Aortic Aneurysm Screening Ultrasound   Once between the age of 50-75 with a family history of AAA       Your Personalized Schedule for Preventive Tests   Health Maintenance: Pending and Last Completed       Date Due Completion Date    Osteoporosis screening Never done ---    Adult Tdap-Td (1 - Tdap) Never done ---    Shingles Vaccine (1 of 2) Never done ---    Pneumococcal 65+ Years Low Risk (1 of 1 - PPSV23) Never done ---    Depression Screening 12/24/2019 12/24/2018    Influenza Vaccine (1) 03/14/2020 ---    Annual Wellness Visit 01/18/2021 01/19/2020

## 2020-01-19 NOTE — Progress Notes (Signed)
Notified patient   [name], [place]  01/19/2020, 14:27

## 2020-01-19 NOTE — Nursing Note (Signed)
venipuncture performed in office-L Arm

## 2020-01-19 NOTE — H&P (Signed)
INTERNAL MEDICINE, PHYSICIAN OFFICE BUILDING  [address]  [name] [facility] [address]  Operated by [facility]  Medicare Annual Wellness Visit    Name: [name] MRN:  [id number]   Date: 01/19/2020 Age: 85 y.o.       SUBJECTIVE:   [name] is a 85 y.o. female for presenting for Medicare Wellness exam.  No new surgeries.  No new medicines.  No new allergies to medicines.  Remains physically mentally active.  Appetite been good.  Weight is been stable.  Compliant with the COVID-19 precautions.  I have reviewed and reconciled the medication list with the patient today.    I have reviewed and updated as appropriate the past medical, family and social history. 01/19/2020 as summarized below:  Past Medical History:   Diagnosis Date   . Fibromyalgia    . Hypercholesterolemia      History reviewed. No pertinent surgical history.  Current Outpatient Medications   Medication Sig   . Ibuprofen (MOTRIN) 800 mg Oral Tablet Take 1 Tablet (800 mg total) by mouth Three times a day as needed for Pain   . levothyroxine (SYNTHROID) 50 mcg Oral Tablet Take 1 Tab (50 mcg total) by mouth Every morning     Family Medical History:     Problem Relation (Age of Onset)    Hypertension (High Blood Pressure) Mother, Father            Social History     Socioeconomic History   . Marital status: Widowed     Spouse name: Not on file   . Number of children: Not on file   . Years of education: Not on file   . Highest education level: Not on file   Tobacco Use   . Smoking status: Never Smoker   . Smokeless tobacco: Never Used   Substance and Sexual Activity   . Drug use: Never   . Sexual activity: Not Currently     Social Determinants of Health     Financial Resource Strain:    . Difficulty of Paying Living Expenses:    Food Insecurity:    . Worried About Programme researcher, broadcasting/film/video in the Last Year:    . Barista in the Last Year:    Transportation Needs:    . Freight forwarder (Medical):    Marland Kitchen Lack of Transportation  (Non-Medical):    Physical Activity:    . Days of Exercise per Week:    . Minutes of Exercise per Session:    Stress:    . Feeling of Stress :    Intimate Partner Violence:    . Fear of Current or Ex-Partner:    . Emotionally Abused:    Marland Kitchen Physically Abused:    . Sexually Abused:        REVIEW OF SYSTEMS:  CARDIAC:  No chest pain, DOE, or palpitations. No orthopnea or PND.    PULMONARY:  No cough, sputum, or hemoptysis. No fever,chills, or night sweats.    GI:  No nausea or vomiting. No abdominal pain. No change in bowel habits          No melena or bright red rectal bleeding.    GU :  Voiding without difficulty .  No dysuria hematuria    NEURO: No headache , diplopia or loss of function of limbs.     List of Current Health Care Providers   Care Team     PCP  Name Type Specialty Phone Number    Marylee Floras, MD Physician INTERNAL MEDICINE [phone]          Care Team     No care team found                  Health Maintenance   Topic Date Due   . Osteoporosis screening  Never done   . Adult Tdap-Td (1 - Tdap) Never done   . Shingles Vaccine (1 of 2) Never done   . Pneumococcal 65+ Years Low Risk (1 of 1 - PPSV23) Never done   . Influenza Vaccine (1) 03/14/2020   . Depression Screening  01/18/2021   . Annual Wellness Visit  01/18/2021   . Covid-19 Vaccine  Completed     Medicare Wellness Assessment   Medicare initial or wellness physical in the last year?: No  Advance Directives (optional)   Does patient have a living will or MPOA: no   Has patient provided [employer] with a copy?: no   Advance directive information given to the patient today?: no      Activities of Daily Living   Do you need help with dressing, bathing, or walking?: No   Do you need help with shopping, housekeeping, medications, or finances?: Yes   Do you have rugs in hallways, broken steps, or poor lighting?: No   Do you have grab bars in your bathroom, non-slip strips in your tub, and hand rails on your stairs?: Yes   Urinary  Incontinence Screen (Women >=65 only)   Do you ever leak urine when you don't want to?: YES   Cognitive Function Screen (1=Yes, 0=No)   What is you age?: Correct   What is the time to the nearest hour?: Correct   What is the year?: Correct   What is the name of this clinic?: Correct   Can the patient recognize two persons (the doctor, the nurse, home help, etc.)?: Correct   What is the date of your birth? (day and month sufficient) : Correct   In what year did World War II end?: Correct   Who is the current president of the [place]?: Correct   Count from 20 down to 1?: Correct   What address did I give you earlier?: Correct   Total Score: 10   Hearing Screen   Have you noticed any hearing difficulties?: Yes   Fall Risk Screen   Do you feel unsteady when standing or walking?: Yes  Do you worry about falling?: Yes  Have you fallen in the past year?: No  Timed up and go test (in seconds): 5   Vision Screen           Depression Screen   Little interest or pleasure in doing things.: Not at all  Feeling down, depressed, or hopeless: Several Days  PHQ 2 Total: 1        OBJECTIVE:   Physical Exam:  BP 116/62 (Site: Left, Patient Position: Sitting, Cuff Size: Adult)   Pulse 62   Resp 18   Ht 1.524 m (5')   Wt (!) 34.9 kg (77 lb)   LMP  (LMP Unknown)   SpO2 97%   BMI 15.04 kg/m       General appearance: alert, oriented x 3, in her normal state, cooperative, not in apparent distress, appearing stated age   Neck: No lymphadenopathy or thyroid anomalies noted, carotids 2 + bilaterally without bruit  Lungs: clear to auscultation bilaterally. No  crackles or wheeze noted. Normal respiratory effort  Heart: No JVD. Regular rate and rhythm, S1, S2 normal.  No gallop or rub. No murmur  Abdomen: soft, non-tender. Bowel sounds normal. No hepatosplenomegaly.  Extremities: no cyanosis or edema.  Pulses intact.  No calf pain    Health Maintenance Due   Topic Date Due   . Osteoporosis screening  Never done   . Adult Tdap-Td (1  - Tdap) Never done   . Shingles Vaccine (1 of 2) Never done   . Pneumococcal 65+ Years Low Risk (1 of 1 - PPSV23) Never done       ASSESSMENT & PLAN:    1. Medicare annual wellness visit, subsequent  Completed  - CBC; Future  - COMPREHENSIVE METABOLIC PANEL, NON-FASTING; Future  - URINALYSIS WITH REFLEX MICROSCOPIC AND CULTURE IF POSITIVE; Future  - THYROXINE, FREE (FREE T4); Future  - THYROID STIMULATING HORMONE (SENSITIVE TSH); Future  - LIPID PANEL; Future    2. Acquired hypothyroidism  No signs or symptoms of higher low thyroid.  Check free T4 and TSH adjust Synthroid if needed  - CBC; Future  - COMPREHENSIVE METABOLIC PANEL, NON-FASTING; Future  - URINALYSIS WITH REFLEX MICROSCOPIC AND CULTURE IF POSITIVE; Future  - THYROXINE, FREE (FREE T4); Future  - THYROID STIMULATING HORMONE (SENSITIVE TSH); Future  - LIPID PANEL; Future    3. Weight loss  Counseled patient on nutrition.  Counseled not to be concerned about sugar content of food or cholesterol at this point.  - CBC; Future  - COMPREHENSIVE METABOLIC PANEL, NON-FASTING; Future  - URINALYSIS WITH REFLEX MICROSCOPIC AND CULTURE IF POSITIVE; Future  - THYROXINE, FREE (FREE T4); Future  - THYROID STIMULATING HORMONE (SENSITIVE TSH); Future  - LIPID PANEL; Future       Identified Risk Factors/ Recommended Actions   BMI addressed: Advised on importance of nutrition to increase below normal BMI.     Fall Risk Follow up plan of care: Discussed optimizing home safety    Urinary Incontinence Plan of Care: Lifestyle modifications  Reassess at follow up visit  Orders Placed This Encounter   . CBC   . COMPREHENSIVE METABOLIC PANEL, NON-FASTING   . URINALYSIS WITH REFLEX MICROSCOPIC AND CULTURE IF POSITIVE   . THYROXINE, FREE (FREE T4)   . THYROID STIMULATING HORMONE (SENSITIVE TSH)   . LIPID PANEL   . Ibuprofen (MOTRIN) 800 mg Oral Tablet          The patient has been educated about risk factors and recommended preventive care. Written Prevention Plan completed/ updated  and given to patient (see After Visit Summary).    Return in about 6 months (around 07/21/2020).    Marylee Floras, MD  [facility] PHYSICIAN OFFICE BUILDING  INTERNAL MEDICINE, PHYSICIAN OFFICE BUILDING  [address]  [name] [facility] [address]

## 2020-01-20 DIAGNOSIS — C50519 Malignant neoplasm of lower-outer quadrant of unspecified female breast: Secondary | ICD-10-CM

## 2020-02-24 ENCOUNTER — Emergency Department (HOSPITAL_COMMUNITY): Payer: Medicare Other | Admitting: Radiology

## 2020-02-24 ENCOUNTER — Emergency Department (HOSPITAL_COMMUNITY): Payer: Medicare Other

## 2020-02-24 ENCOUNTER — Encounter (HOSPITAL_COMMUNITY): Payer: Self-pay

## 2020-02-24 ENCOUNTER — Other Ambulatory Visit: Payer: Self-pay

## 2020-02-24 ENCOUNTER — Emergency Department
Admission: EM | Admit: 2020-02-24 | Discharge: 2020-02-24 | Disposition: A | Discharge status: Home / Self Care | Payer: Medicare Other | Attending: General Practice | Admitting: General Practice

## 2020-02-24 DIAGNOSIS — M79602 Pain in left arm: Secondary | ICD-10-CM | POA: Insufficient documentation

## 2020-02-24 DIAGNOSIS — M797 Fibromyalgia: Secondary | ICD-10-CM | POA: Insufficient documentation

## 2020-02-24 DIAGNOSIS — M79603 Pain in arm, unspecified: Secondary | ICD-10-CM

## 2020-02-24 DIAGNOSIS — M1909 Primary osteoarthritis, other specified site: Secondary | ICD-10-CM | POA: Insufficient documentation

## 2020-02-24 DIAGNOSIS — R918 Other nonspecific abnormal finding of lung field: Secondary | ICD-10-CM | POA: Insufficient documentation

## 2020-02-24 DIAGNOSIS — R9431 Abnormal electrocardiogram [ECG] [EKG]: Secondary | ICD-10-CM | POA: Insufficient documentation

## 2020-02-24 LAB — CBC WITH DIFF
BASOPHIL #: 0.1 10*3/uL (ref 0.00–0.20)
BASOPHIL %: 1 %
EOSINOPHIL #: 0.1 10*3/uL (ref 0.00–0.50)
EOSINOPHIL %: 1 %
HCT: 39.3 % (ref 34.6–46.2)
HGB: 13.6 g/dL (ref 11.8–15.8)
LYMPHOCYTE #: 2.2 10*3/uL (ref 0.90–3.40)
LYMPHOCYTE %: 42 %
MCH: 31.2 pg (ref 27.6–33.2)
MCHC: 34.7 g/dL (ref 32.6–35.4)
MCV: 90 fL (ref 82.3–96.7)
MONOCYTE #: 0.4 10*3/uL (ref 0.20–0.90)
MONOCYTE %: 7 %
MPV: 7.7 fL (ref 6.6–10.2)
NEUTROPHIL #: 2.5 10*3/uL (ref 1.50–6.40)
NEUTROPHIL %: 48 %
PLATELETS: 216 10*3/uL (ref 140–440)
RBC: 4.37 10*6/uL (ref 3.80–5.24)
RDW: 13.8 % (ref 12.4–15.2)
WBC: 5.3 10*3/uL (ref 3.5–10.3)

## 2020-02-24 LAB — BASIC METABOLIC PANEL
ANION GAP: 9 mmol/L
BUN/CREA RATIO: 14
BUN: 12 mg/dL (ref 10–25)
CALCIUM: 9.5 mg/dL (ref 8.8–10.3)
CHLORIDE: 100 mmol/L (ref 98–111)
CO2 TOTAL: 28 mmol/L (ref 21–35)
CREATININE: 0.83 mg/dL (ref <=1.30)
ESTIMATED GFR: >60 mL/min/{1.73_m2}
GLUCOSE: 89 mg/dL (ref 70–110)
POTASSIUM: 4 mmol/L (ref 3.5–5.0)
SODIUM: 137 mmol/L (ref 135–145)

## 2020-02-24 LAB — PT/INR: INR: 0.99 (ref 0.80–1.10)

## 2020-02-24 LAB — TROPONIN-I
TROPONIN I: <0.03 ng/mL (ref <=0.04)
TROPONIN I: <0.03 ng/mL (ref <=0.04)

## 2020-02-24 MED ORDER — ACETAMINOPHEN 1,000 MG/100 ML (10 MG/ML) INTRAVENOUS SOLUTION
15.0000 mg/kg | INTRAVENOUS | Status: AC
Start: 2020-02-24 — End: 2020-02-24
  Administered 2020-02-24: 0 mg/kg via INTRAVENOUS
  Administered 2020-02-24: 524 mg via INTRAVENOUS
  Filled 2020-02-24: qty 100

## 2020-02-24 NOTE — Discharge Instructions (Signed)
General Instructions:  You are considered stable for discharge from the emergency department. Please carefully follow all the instructions you were given verbally as well as the written instructions given below. In general, immediately return to the emergency department if the symptoms you presented with today increase in severity, change in any way, and/or do not improve in what you consider an acceptable time frame.  Return if you develop fever >101, vomiting, persistent inability to eat/drink, chest pain, shortness of breath, weakness, change in behavior, or any other concerns.    Medication(s) Instructions (if applicable):  If you were given any medication(s) upon discharge, please strictly follow the directions as prescribed for taking the medication(s). Should you feel you develop any type of reaction to the medication(s), including, but not limited to, rash, swelling of the lips or face, or difficulty breathing, immediately discontinue the use of the medication(s) and seek prompt medical care. Please read the medication(s) insert provided by the pharmacy and follow all guidelines and recommendations. Please take all medications as prescribed for your symptoms or diagnoses.                Follow-Up Instructions:  If you were given instructions to follow-up with a health care provider upon discharge, please be sure to do so.  It is your responsibility to call and/or make an appointment with the health care providers listed on your discharge papers and/or your primary care provider in the appropriate time frame given.  Please take a copy of your discharge papers with you to your follow-up appointment(s). Please follow up with your primary care physician in 2-3 days or earlier if needed for your symptoms. Please follow up with any specialist provider in clinic that your were given instructions to see as an outpatient.     YOU MUST CALL THE NUMBER LISTED ON THE DISCHARGE PAPERWORK TO CONFIRM YOUR  APPOINTMENT.    Special Information / Instructions:  Please read and follow all attached discharge instructions.        Please return to the Emergency Department if you have persistence or worsening of your symptoms.

## 2020-02-24 NOTE — ED Attending Handoff Note (Signed)
Emergency Department  Course Note    Patient Name: [name]  Age and Gender: 85 y.o. female  Date of Birth: [birth year]  Date of Service: 02/24/2020  MRN: [id number]  PCP: Filbert Berthold, MD      Allergies   Allergen Reactions    Grass Pollen        Vitals:    02/24/20 0449 02/24/20 0645 02/24/20 0705   BP: 134/75 (!) 145/59    Pulse: 75 64    Resp: 16 18    Temp: 36.5 C (97.7 F)     SpO2: 99%  100%   Weight: (!) 34.9 kg (77 lb)             HPI:  In brief, patient is a 85 y.o. femalewho presents to the emergency department due to arm pain in the left between  Elbow and shoulder.  Some chronic neck pain she contributes to fibromyalgia.  No CAD per patient.  No other symptoms.    Pertinent Exam Findings:  Per primary    Pertinent Imaging/Lab results:  Labs Reviewed   PT/INR - Normal    Narrative:     Coumadin Therapy INR range for Conventional Anticoagulation Therapy is 2.0 to 3.0 and for Intensive Anticoagulation Therapy 2.5 to 3.5   TROPONIN-I - Normal   TROPONIN-I - Normal   BASIC METABOLIC PANEL    Narrative:     Estimated Glomerular Filtration Rate (eGFR) calculated using the CKD-EPI (2009) equation, intended for patients 33 years of age and older. If race and/or gender is not documented or "unknown," there will be no eGFR calculation.   CBC/DIFF    Narrative:     The following orders were created for panel order CBC/DIFF.  Procedure                               Abnormality         Status                     ---------                               -----------         ------                     CBC WITH AVWU[[id number]]                                    Final result                 Please view results for these tests on the individual orders.   CBC WITH DIFF       Results for orders placed or performed during the hospital encounter of 02/24/20   XR AP MOBILE CHEST     Status: None    Narrative    [name]    XR AP MOBILE CHEST performed on 02/24/2020 6:13 AM    INDICATION: 85 years old Female;  left arm pain, concern for chest pain  equivalent    COMPARISON: 03/04/2019.     FINDINGS:  Heart is at the upper limits of normal in size. No vascular congestion or  pulmonary edema. Redemonstration of chronic lung changes to include  hyperinflation.  Pleural thickening at the apices was present on the  previous exam and may represent scarring. No focal consolidation, pleural  effusion, or pneumothorax identified.      Impression    No acute cardiopulmonary abnormality. Chronic lung changes.          Radiologist location ID: [id number]     XR HUMERUS LEFT     Status: None    Narrative    [name]    PROCEDURE DESCRIPTION: XR HUMERUS LEFT 2 VIEW    CLINICAL INDICATION: left arm pain    TECHNIQUE: 2 views / 2 images submitted.    COMPARISON: None.      Impression    FINDINGS/IMPRESSION: Advanced humeral degenerative arthrosis. There is  significant high riding of the humeral head with diminished subacromial  space which can be seen with chronic rotator cuff tear. No fracture  identified.                Radiologist location ID: [id number]         Pending Studies:  XR and labs    Consults:  Per primary    Plan:  Labs XR if neg DC with delta and biundo f/u  Results up to the Time the Disposition was Entered   PT/INR - Normal    Narrative:     Coumadin Therapy INR range for Conventional Anticoagulation Therapy is 2.0 to 3.0 and for Intensive Anticoagulation Therapy 2.5 to 3.5   TROPONIN-I - Normal   TROPONIN-I - Normal   ECG 12 LEAD - ED USE    Narrative:     -------------------------ECG Interpretation-----------------------------------  Sinus rhythm  Prolonged PR interval  Left bundle branch block  .  .  .  MD Signature: Unconfirmed Diagnosis   BASIC METABOLIC PANEL    Narrative:     Estimated Glomerular Filtration Rate (eGFR) calculated using the CKD-EPI (2009) equation, intended for patients 60 years of age and older. If race and/or gender is not documented or "unknown," there will be no eGFR calculation.    CBC/DIFF    Narrative:     The following orders were created for panel order CBC/DIFF.  Procedure                               Abnormality         Status                     ---------                               -----------         ------                     CBC WITH YOVZ[[id number]]                                    Final result                 Please view results for these tests on the individual orders.   CBC WITH DIFF   XR AP MOBILE CHEST    Narrative:     [name]    XR AP MOBILE CHEST performed on 02/24/2020 6:13 AM    INDICATION: 85 years old  Female; left arm pain, concern for chest pain  equivalent    COMPARISON: 03/04/2019.     FINDINGS:  Heart is at the upper limits of normal in size. No vascular congestion or  pulmonary edema. Redemonstration of chronic lung changes to include  hyperinflation. Pleural thickening at the apices was present on the  previous exam and may represent scarring. No focal consolidation, pleural  effusion, or pneumothorax identified.     XR HUMERUS LEFT    Narrative:     Mane L Zuckerman    PROCEDURE DESCRIPTION: XR HUMERUS LEFT 2 VIEW    CLINICAL INDICATION: left arm pain    TECHNIQUE: 2 views / 2 images submitted.    COMPARISON: None.     acetaminophen (OFIRMEV) 10 mg/mL IV 524 mg (0 mg/kg  34.9 kg Intravenous Stopped 02/24/20 3536)       After a thorough discussion of the patient including presentation, ED course, and review of above information I have assumed care of [name] from Dr. Nicki Reaper at 06:42     Course:  -patient's labs and imaging unremarkable  -discussed with patient lower suspicion for ACS but did offer her delta troponin given potential for cardiac etiology she was agreeable to proceed  -repeat troponin was unremarkable.  Patient to be discharged will follow up as an outpatient with PCP.  Patient given referral to Dr. Bernette Redbird for left shoulder pain.  Given that there is a possible rotator cuff injury.

## 2020-02-24 NOTE — ED Provider Notes (Signed)
Note begun by:  Ambrose Mantle, MD 02/24/2020, 05:09      HPI :    85 y.o. female presents with chief complaint of left arm pain.  Patient states that her arm has been hurting since yesterday evening.  She describes it as an aching constant sensation that just will not go away.  She states she does have a history of fibromyalgia with this pain is different.  She states the pain does at times seem to radiate up into her neck but does not go into her chest at all.  She denies shortness of breath or nausea.  No diaphoresis.  Patient states she has a history of a weekend heart muscle but has never had a heart attack or have a steps in her heart.  She denies any specific injury or overuse recently.      Review of Systems:   Constitutional:  No chills, body aches, fever, weakness   Skin:  No rashes or diaphoresis   HENT: No congestion, sore throat, runny nose   Eyes:  No vision changes, discharge   Cardio:  No chest pain No palpitations or leg swelling    Respiratory:  No shortness of breath No cough or wheezing   GI:  No nausea, vomiting, diarrhea, constipation or abdominal pain   GU:   No dysuria, hematuria, polyuria   MSK: + left upper arm pain, neck pain   Neuro:  No loss of sensation, focal deficits, headaches, or LOC   Psych: No SI, HI, AV hallucinations, or substance abuse.     All other symptoms reviewed and are negative    Past Medical History:   Diagnosis Date    Fibromyalgia     Hypercholesterolemia                    Family Medical History:     Problem Relation (Age of Onset)    Hypertension (High Blood Pressure) Mother, Father              Social History     Socioeconomic History    Marital status: Widowed     Spouse name: Not on file    Number of children: Not on file    Years of education: Not on file    Highest education level: Not on file   Tobacco Use    Smoking status: Never Smoker    Smokeless tobacco: Never Used   Substance and Sexual Activity    Drug use: Never    Sexual  activity: Not Currently     Social Determinants of Health     Financial Resource Strain:     Difficulty of Paying Living Expenses:    Food Insecurity:     Worried About Programme researcher, broadcasting/film/video in the Last Year:     Barista in the Last Year:    Transportation Needs:     Freight forwarder (Medical):     Lack of Transportation (Non-Medical):    Physical Activity:     Days of Exercise per Week:     Minutes of Exercise per Session:    Stress:     Feeling of Stress :    Intimate Partner Violence:     Fear of Current or Ex-Partner:     Emotionally Abused:     Physically Abused:     Sexually Abused:        Allergy History as of 02/24/20     GRASS POLLEN  Noted Status Severity Type Reaction    12/23/16 0957 Richardson Dopp, MA 12/23/16 Active                     PE :   VS on presentation: Blood pressure 134/75, pulse 75, temperature 36.5 C (97.7 F), resp. rate 16, weight (!) 34.9 kg (77 lb), SpO2 99 %, not currently breastfeeding.     General - awake, alert, oriented  HEENT - moist mucous membranes, no evidence of trauma  Heart - regular rate and rhythm, normal perfusion  Lungs - clear to ascultation bilaterally, no respiratory distress  Abdomen - soft, non-tender, no distension  Extremities - FROM x4, no trauma or deformity; unable to reproduce any tenderness to palpation of the left upper extremity, normal radial pulse, full range of motion at wrist, elbow, shoulder        Data/Test :    EKG :  Sinus rhythm, left bundle branch block, similar previous  Images Review by me : as per orders  Image Reports Review by me :   XR HUMERUS LEFT   Final Result   FINDINGS/IMPRESSION: Advanced humeral degenerative arthrosis. There is   significant high riding of the humeral head with diminished subacromial   space which can be seen with chronic rotator cuff tear. No fracture   identified.                        Radiologist location ID: [id number]         XR AP MOBILE CHEST   Final Result   No acute  cardiopulmonary abnormality. Chronic lung changes.               Radiologist location ID: [id number]             Labs :  Pending      Clinical Impression :   Pending    ED Course :  Patient seen evaluated remained satisfactory condition throughout course.  Patient presents for evaluation of left arm pain.  Patient does have a history of heart related issues and pain is somewhat atypical without specific injury.  Concern for underlying cardiac equivalent.  Cardiac workup was initiated, EKG is unremarkable.  Patient care was transferred to Dr. Andrey Campanile with laboratory testing, re-evaluation, and disposition pending.              Dispo :   Pending    CRITICAL CARE : None      Chart may have been completed after conclusion of patient care due to time constraints a of direct patient care during shift.  Chart was dictated using voice recognition software which may lead to minor degree medical or syntax errors.

## 2020-02-24 NOTE — ED Nurses Note (Signed)
Patient given discharge instructions; Patient verbalized understanding.  IV was removed and patient was discharged home at this time.

## 2020-02-25 ENCOUNTER — Telehealth (HOSPITAL_COMMUNITY): Payer: Self-pay | Admitting: Internal Medicine

## 2020-02-25 LAB — ECG 12 LEAD - ED USE
Calculated P Axis: 71 deg
Calculated T Axis: 103 deg
EKG Severity: ABNORMAL
Heart Rate: 74 {beats}/min
I 40 Axis: -23 deg
PR Interval: 222 ms
QRS Axis: -31 deg
QRS Duration: 124 ms
QT Interval: 469 ms
QTC Calculation: 521 ms
ST Axis: 125 deg
T 40 Axis: -47 deg

## 2020-02-25 NOTE — Telephone Encounter (Signed)
Chart reviewed prior to patient contact.  Follow-up call placed to patient in regards to ED discharge on 02/24/2020.  Following interventions were completed: No answer - unable to leave message  Melville Engen L Krisanne Lich, RN   Nurse Navigator - Population Health

## 2020-02-29 ENCOUNTER — Other Ambulatory Visit (HOSPITAL_BASED_OUTPATIENT_CLINIC_OR_DEPARTMENT_OTHER): Payer: Self-pay | Admitting: Internal Medicine

## 2020-02-29 MED ORDER — LACTULOSE 10 GRAM/15 ML ORAL SOLUTION
45.0000 mL | Freq: Two times a day (BID) | ORAL | 1 refills | Status: AC
Start: 2020-02-29 — End: 2020-04-29

## 2020-05-04 DIAGNOSIS — I340 Nonrheumatic mitral (valve) insufficiency: Secondary | ICD-10-CM

## 2020-07-26 ENCOUNTER — Encounter (HOSPITAL_BASED_OUTPATIENT_CLINIC_OR_DEPARTMENT_OTHER): Payer: Self-pay | Admitting: Internal Medicine

## 2020-08-30 ENCOUNTER — Other Ambulatory Visit (HOSPITAL_BASED_OUTPATIENT_CLINIC_OR_DEPARTMENT_OTHER): Payer: Self-pay | Admitting: Internal Medicine

## 2020-08-30 ENCOUNTER — Ambulatory Visit: Payer: Medicare Other | Attending: Internal Medicine | Admitting: Internal Medicine

## 2020-08-30 ENCOUNTER — Other Ambulatory Visit: Payer: Self-pay

## 2020-08-30 ENCOUNTER — Encounter (HOSPITAL_BASED_OUTPATIENT_CLINIC_OR_DEPARTMENT_OTHER): Payer: Self-pay | Admitting: Internal Medicine

## 2020-08-30 VITALS — BP 128/68 | HR 74 | Resp 18 | Ht 62.0 in | Wt 82.0 lb

## 2020-08-30 DIAGNOSIS — E039 Hypothyroidism, unspecified: Secondary | ICD-10-CM | POA: Insufficient documentation

## 2020-08-30 DIAGNOSIS — E782 Mixed hyperlipidemia: Secondary | ICD-10-CM | POA: Insufficient documentation

## 2020-08-30 DIAGNOSIS — R634 Abnormal weight loss: Secondary | ICD-10-CM | POA: Insufficient documentation

## 2020-08-30 LAB — CBC
HCT: 42.5 % (ref 34.8–46.0)
HGB: 13.5 g/dL (ref 11.5–16.0)
MCH: 30.1 pg (ref 26.0–32.0)
MCHC: 31.8 g/dL (ref 31.0–35.5)
MCV: 94.7 fL (ref 78.0–100.0)
MPV: 10.6 fL (ref 8.7–12.5)
PLATELETS: 222 10*3/uL (ref 150–400)
RBC: 4.49 10*6/uL (ref 3.85–5.22)
RDW-CV: 12.9 % (ref 11.5–15.5)
WBC: 5.9 10*3/uL (ref 3.7–11.0)

## 2020-08-30 LAB — COMPREHENSIVE METABOLIC PANEL, NON-FASTING
ALBUMIN: 4.9 g/dL — ABNORMAL HIGH (ref 3.2–4.6)
ALKALINE PHOSPHATASE: 63 U/L (ref 20–130)
ALT (SGPT): 5 U/L (ref <=52)
ANION GAP: 9 mmol/L
AST (SGOT): 15 U/L (ref <=35)
BILIRUBIN TOTAL: 1.1 mg/dL (ref 0.3–1.2)
BUN/CREA RATIO: 19
BUN: 14 mg/dL (ref 10–25)
CALCIUM: 9.7 mg/dL (ref 8.8–10.3)
CHLORIDE: 101 mmol/L (ref 98–111)
CO2 TOTAL: 30 mmol/L (ref 21–35)
CREATININE: 0.75 mg/dL (ref <=1.30)
ESTIMATED GFR: >60 mL/min/{1.73_m2}
GLUCOSE: 86 mg/dL (ref 70–110)
POTASSIUM: 4.4 mmol/L (ref 3.5–5.0)
PROTEIN TOTAL: 6.9 g/dL (ref 6.0–8.3)
SODIUM: 140 mmol/L (ref 135–145)

## 2020-08-30 LAB — LIPID PANEL
CHOLESTEROL: 271 mg/dL — ABNORMAL HIGH (ref 0–199)
HDL CHOL: 88 mg/dL (ref >40)
LDL DIRECT: 184 mg/dL — ABNORMAL HIGH (ref 0–99)
TRIGLYCERIDES: 70 mg/dL (ref 0–199)
VLDL CALC: 14 mg/dL (ref 0–50)

## 2020-08-30 LAB — URINALYSIS, MACRO/MICRO
BILIRUBIN: NEGATIVE mg/dL
BLOOD: NEGATIVE mg/dL
COLOR: NORMAL
GLUCOSE: NORMAL mg/dL
KETONES: NEGATIVE mg/dL
LEUKOCYTES: NEGATIVE WBCs/uL
NITRITE: NEGATIVE
PH: 7 (ref 5.0–8.0)
PROTEIN: NEGATIVE mg/dL
SPECIFIC GRAVITY: 1.008 (ref 1.005–1.030)
UROBILINOGEN: NORMAL mg/dL

## 2020-08-30 LAB — THYROXINE, FREE (FREE T4): THYROXINE (T4), FREE: 0.77 ng/dL (ref 0.61–1.12)

## 2020-08-30 LAB — THYROID STIMULATING HORMONE (SENSITIVE TSH): TSH: 20.254 u[IU]/mL — ABNORMAL HIGH (ref 0.450–5.330)

## 2020-08-30 MED ORDER — LEVOTHYROXINE 50 MCG TABLET
50.0000 ug | ORAL_TABLET | Freq: Every morning | ORAL | 4 refills | Status: DC
Start: 2020-08-30 — End: 2020-08-30

## 2020-08-30 MED ORDER — IBUPROFEN 800 MG TABLET
800.0000 mg | ORAL_TABLET | Freq: Three times a day (TID) | ORAL | 3 refills | Status: DC | PRN
Start: 2020-08-30 — End: 2021-02-28

## 2020-08-30 MED ORDER — LEVOTHYROXINE 75 MCG TABLET
75.0000 ug | ORAL_TABLET | Freq: Every morning | ORAL | 3 refills | Status: DC
Start: 2020-08-30 — End: 2021-09-04

## 2020-08-30 NOTE — Nursing Note (Signed)
venipuncture performed in office-L Arm

## 2020-08-30 NOTE — H&P (Signed)
[facility] - Internal Medicine   [address] [place] [address] [place] [address]  Dept: [phone]  Dept Fax: [phone]    ARWILDA GEORGIA  08-Apr-1935  [id number]    Date of Service: 08/30/2020 10:00 AM EST    Chief complaint:   Chief Complaint   Patient presents with    Annual Wellness Exam       HPI:     This is a case of a 86 y.o. year old female who comes in today for six-month follow-up.  No new surgeries.  No new medicines.  No new allergies to medicines appetite been good.  Patient's weight is up 2-3 lb which is encouraging.  Remains physically mentally active.  Compliant the COVID-19 precautions.    ROS:    CARDIAC:  No chest pain, DOE, or palpitations. No orthopnea or PND.    PULMONARY:  No cough, sputum, or hemoptysis. No fever,chills, or night sweats.    GI:  No nausea or vomiting. No abdominal pain. No change in bowel habits          No melena or bright red rectal bleeding.    GU :  Voiding without difficulty     NEURO: No headache , diplopia or loss of function of limbs.    Current Outpatient Medications   Medication Sig    Ibuprofen (MOTRIN) 800 mg Oral Tablet Take 1 Tablet (800 mg total) by mouth Three times a day as needed for Pain    levothyroxine (SYNTHROID) 50 mcg Oral Tablet Take 1 Tab (50 mcg total) by mouth Every morning       Objective:     BP 128/68    Pulse 74    Resp 18    Ht 1.575 m (5\' 2" )    Wt (!) 37.2 kg (82 lb)    LMP  (LMP Unknown)    SpO2 95%    BMI 15.00 kg/m       General appearance: alert, oriented x 3, in her normal state, cooperative, not in apparent distress, appearing stated age   Neck: No lymphadenopathy or thyroid anomalies noted, carotids 2 + bilaterally without bruit  Lungs: clear to auscultation bilaterally. No crackles or wheeze noted. Normal respiratory effort  Heart: No JVD. Regular rate and rhythm, S1, S2 normal.  No gallop or rub. No murmur  Abdomen: soft, non-tender. Bowel sounds normal. No hepatosplenomegaly.  Extremities: no cyanosis or  edema.  Pulses intact.  No calf pain    Assessment/Plan     ENCOUNTER DIAGNOSES     ICD-10-CM   1. Weight loss .  Continue to push nutrition.  Check laboratory studies. R63.4   2. Acquired hypothyroidism .  Check free T4 and TS H. Check other laboratory studies. E03.9   3. Mixed hyperlipidemia .  Check lipid panel.  Keep in mind patient's advanced age and nutritional needs E78.2               Orders Placed This Encounter    CBC    COMPREHENSIVE METABOLIC PANEL, NON-FASTING    LIPID PANEL    URINALYSIS WITH REFLEX MICROSCOPIC AND CULTURE IF POSITIVE    THYROID STIMULATING HORMONE (SENSITIVE TSH)    THYROXINE, FREE (FREE T4)         The patient was given ample opportunity to ask questions and those questions were answered to the patient's satisfaction. The patient was encouraged to be involved in their own care, and all diagnoses, medications, and  medication side-effects were discussed.  A copy of the patient's medication list was printed and given to the patient. A good faith effort was made to reconcile the patient's medications.  The patient was told to contact me with any additional questions or concerns, or go to the ED in an emergency.     Follow up: Return in about 6 months (around 02/27/2021).    This note was partially generated using MModal Fluency Direct system, and there may be some incorrect words, spellings, and punctuation that were not noted in checking the note before saving.    Harrie Foreman, M.D.    [facility] - Internal Medicine   [address] [place] [address] [address]  Dept: [phone]  Dept Fax: [phone]

## 2020-08-30 NOTE — Telephone Encounter (Signed)
-----   Message from Marylee Floras, MD sent at 08/30/2020  1:22 PM EST -----  Please call patient and informed that patient's TSH is increased again.  Would increase Synthroid to 75 mcg.  Dispense 90. One p.o. q.day.  Three refills.  Recheck free T4 and TSH in 4 weeks  Marylee Floras, MD

## 2020-08-30 NOTE — Result Encounter Note (Signed)
Notified Patient with her test results and sent new medication to [facility]. I also put orders in the computer for TSH and t4 in 4 weeks.   Delfin Gant, MA  08/30/2020, 14:13

## 2021-01-17 ENCOUNTER — Ambulatory Visit: Payer: Medicare Other | Admitting: Oncology

## 2021-01-23 ENCOUNTER — Ambulatory Visit (HOSPITAL_BASED_OUTPATIENT_CLINIC_OR_DEPARTMENT_OTHER): Payer: Self-pay | Admitting: Internal Medicine

## 2021-01-28 ENCOUNTER — Telehealth: Payer: Self-pay | Admitting: Oncology

## 2021-01-28 NOTE — Progress Notes (Signed)
[facility]  [address] [place],  [place]  [address] [phone]  Clinic Day:  02/01/2021  Referring physician: Algis Greenhouse, MD  This document serves as a record of services personally performed by Hosie Poisson, MD. It was created on their behalf by Curry,Lauren E, a trained medical scribe. The creation of this record is based on the scribe's personal observations and the provider's statements to them.  CHIEF COMPLAINT:  CC: History of breast cancer  Current Treatment:  Surveillance   HISTORY OF PRESENT ILLNESS:  Linda Sanders is a 86 y.o. 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  She did receive chemotherapy and radiation but this was triple negative and so she is on follow-up but does have multiple other comorbidities including mycobacterium avium intracellulare, polymyositis, multiple pulmonary nodules, and irritable bowel syndrome.  She has repeat CT scans of the chest, abdomen and pelvis on a regular basis, yearly, to follow up on lesions of the lungs, pancreas and kidney.  She has multiple pulmonary nodules which have been present for years and are attributed to her polymyositis.  We have been following a unilocular cystic lesion in the uncinate process of the pancreas for years which has remained relatively stable.  She also has an enhancing lesion in the lower pole of the right kidney measuring 2.4 cm and relatively stable.  In addition to her triple negative breast cancer., she has 3 nieces with breast cancer and a first cousin with breast cancer at age 35.  Her daughter Linda Sanders has had a pancreatic neuroendocrine tumor, with  recurrence.  She also recently had a maternal first cousin with colon cancer who was tested for the colon cancer gene and was negative.  We have therefore recommended genetic testing per NCCN guidelines and that was done last  year.  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  She comes in for yearly follow-up and repeat scans and mammogram.  She had surgical fusion for a herniated disc in May of 2017, and still complains of burning of her lower extremities and intermittent edema.  Her hemoglobin dropped from 13 to 9 postop and she did have significant blood loss during surgery.  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  She also had pneumonia in June of 2017.  We reviewed her medications in detail and she is off the iron supplement, multivitamin and oxycodone, but is now on Lexapro 50 mg daily and vitamin-C 500 mg daily.  She had liver toxicity with Lipitor.  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 Her main complaints today are imbalance, weakness of her lower extremities, cough, dyspnea and occasional low-grade fevers.  She has had some nocturia and incontinence, and she has lost some weight.  Her Rheumatologist, Dr. Dossie Der, orders her bone density scans and the last one in 2018 did show osteoporosis.  She lost her daughter Linda Sanders in early June, who was also one of our patients, and died of pancreatic cancer.  Annual bilateral mammogram from July 2021 was clear.  CT chest, abdomen and pelvis from July 2021 was essentially stable with slight interval enlargement of a soft tissue attenuation, partially exophytic lesion of the posterior midportion of the right kidney measuring 3.5 x 2.3 cm.    INTERVAL HISTORY:  Linda Sanders  is here for annual follow up and states that she has been fairly well.  She did have a few falls since her last visit and underwent physical therapy with improvement.  Annual screening mammography is scheduled for July 29th.  She states that she underwent EGD in January with Dr. Lyda Jester which was unremarkable except for hiatal hernia.  CT chest, abdomen and pelvis from July 15th revealed no  definitive evidence of metastatic disease in the chest abdomen or pelvis.  Similar size of the partially exophytic soft tissue attenuating lesion in the posterior interpolar right kidney measuring 3.1 x 2.4 cm, down from 3.5 x 2.3 cm last year.  Interval increased centrilobular and tree-in-bud nodularity in the left lower lobe, superimposed on the extensive clustered centrilobular and tree-in-bud nodularity as well as focal areas of bronchiectasis and bandlike scarring. Findings are most consistent with atypical infection.  Similar size of the 2.5 cm fluid attenuation lesion in the pancreatic uncinate process, this lesion has very slightly enlarged over a long period of time dating back to 2009 and is most consistent with an IPMN. 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  Blood counts and chemistries are unremarkable except for a sodium of 128.  Her  appetite is good, and she has lost 2 and 1/2 pounds since her last visit.  She denies fever, chills or other signs of infection.  She denies nausea, vomiting, bowel issues, or abdominal pain.  She denies sore throat, cough, dyspnea, or chest pain.  REVIEW OF SYSTEMS:  Review of Systems  Constitutional: Negative.  Negative for appetite change, chills, fatigue, fever and unexpected weight change.  HENT:  Negative.    Eyes: Negative.   Respiratory: Negative.  Negative for chest tightness, cough, hemoptysis, shortness of breath and wheezing.   Cardiovascular: Negative.  Negative for chest pain, leg swelling and palpitations.  Gastrointestinal: Negative.  Negative for abdominal distention, abdominal pain, blood in stool, constipation, diarrhea, nausea and vomiting.  Endocrine: Negative.   Genitourinary: Negative.  Negative for difficulty urinating, dysuria, frequency and hematuria.   Musculoskeletal: Negative.  Negative for arthralgias, back pain, flank pain, gait problem and myalgias.  Skin: Negative.   Neurological: Negative.   Negative for dizziness, extremity weakness, gait problem, headaches, light-headedness, numbness, seizures and speech difficulty.  Hematological: Negative.   Psychiatric/Behavioral: Negative.  Negative for depression and sleep disturbance. The patient is not nervous/anxious.     VITALS:  Blood pressure (!) 193/95, pulse 65, temperature 98.5 F (36.9 C), temperature source Oral, resp. rate 18, height _0  (1.575 m), weight 126 lb 4.8 oz (57.3 kg), SpO2 96 %.  Wt Readings from Last 3 Encounters:  02/01/21 126 lb 4.8 oz (57.3 kg)  07/27/18 120 lb (54.4 kg)  09/17/17 131 lb (59.4 kg)    Body mass index is 23.1 kg/m.  Performance status (ECOG): 0 - Asymptomatic  PHYSICAL EXAM:  Physical Exam Constitutional:      General: She is not in acute distress.    Appearance: Normal appearance. She is normal weight.  HENT:     Head: Normocephalic and atraumatic.  Eyes:     General: No scleral icterus.    Extraocular Movements: Extraocular movements intact.     Conjunctiva/sclera: Conjunctivae normal.     Pupils: Pupils are equal, round, and reactive to light.  Neck:     Comments: Prominent bone in the right supraclavicular fossa, chronic. Cardiovascular:     Rate and Rhythm: Normal rate and regular rhythm.  Pulses: Normal pulses.     Heart sounds: Normal heart sounds. No murmur heard.   No friction rub. No gallop.  Pulmonary:     Effort: Pulmonary effort is normal. No respiratory distress.     Breath sounds: Rales (bilateral, inspiratory) present.  Chest:     Comments: Bilateral breast are without masses.  She has a couple of cysts in the lateral right breast, which are smooth, round and nontender.  She has a large scar in the upper left breast which is well healed and extends to the axilla. Abdominal:     General: Bowel sounds are normal. There is no distension.     Palpations: Abdomen is soft. There is no hepatomegaly, splenomegaly or mass.     Tenderness: There is no abdominal  tenderness.  Musculoskeletal:        General: Normal range of motion.     Cervical back: Normal range of motion and neck supple.     Right lower leg: No edema.     Left lower leg: No edema.  Lymphadenopathy:     Cervical: No cervical adenopathy.  Skin:    General: Skin is warm and dry.  Neurological:     General: No focal deficit present.     Mental Status: She is alert and oriented to person, place, and time. Mental status is at baseline.  Psychiatric:        Mood and Affect: Mood normal.        Behavior: Behavior normal.        Thought Content: Thought content normal.        Judgment: Judgment normal.    LABS:   CBC Latest Ref Rng & Units 12/29/2015 12/28/2015 06/14/2009  WBC 4.0 - 10.5 K/uL 9.6 11.8(H) 5.6  Hemoglobin 12.0 - 15.0 g/dL 10.3(L) 11.3(L) 13.1  Hematocrit 36.0 - 46.0 % 33.2(L) 35.2(L) 39.0  Platelets 150 - 400 K/uL 267 289 286   CMP Latest Ref Rng & Units 12/29/2015 12/28/2015 06/14/2009  Glucose 65 - 99 mg/dL 89 82 83  BUN 6 - 20 mg/dL _0 Creatinine 0.44 - 1.00 mg/dL 0.87 0.99 0.84  Sodium 135 - 145 mmol/L 131(L) 129(L) 141  Potassium 3.5 - 5.1 mmol/L 4.4 4.2 4.3  Chloride 101 - 111 mmol/L 99(L) 98(L) 103  CO2 22 - 32 mmol/L _1 Calcium 8.9 - 10.3 mg/dL 9.3 9.2 10.0  Total Protein 6.5 - 8.1 g/dL 5.9(L) 6.5 6.8  Total Bilirubin 0.3 - 1.2 mg/dL 0.3 0.5 0.2(L)  Alkaline Phos 38 - 126 U/L 68 70 46  AST 15 - 41 U/L _2 ALT 14 - 54 U/L _3 STUDIES:  No results found.   EXAM: 01/25/2021 CT CHEST, ABDOMEN, AND PELVIS WITH CONTRAST  TECHNIQUE: Multidetector CT imaging of the chest, abdomen and pelvis was performed following the standard protocol during bolus administration of intravenous contrast.  CONTRAST:  80 cc of Isovue 370  COMPARISON:  Multiple priors including most recent CT chest abdomen and pelvis January 13, 2020  FINDINGS: CT CHEST FINDINGS  Cardiovascular: Aortic atherosclerosis. Aneurysmal dilation of the ascending  aorta measuring 4 cm, unchanged. 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 No central pulmonary embolus. Left coronary artery calcifications. No significant pericardial effusion/thickening..  Mediastinum/Nodes: Calcified hilar lymph nodes. No pathologically enlarged mediastinal, hilar or axillary lymph nodes. No discrete thyroid nodularity. The trachea esophagus are unremarkable.  Lungs/Pleura: Similar biapical pleuroparenchymal  scarring. Areas of extensive clustered centrilobular history of what nodularity with focal areas of bronchiectasis and bandlike scarring most conspicuous in the upper lobes but seen throughout. There is interval increased centrilobular and tree-in-bud nodularity in the left lower lobe for instance on image 81/4 and 84/4. Densely calcified bilateral pulmonary nodules. No pleural effusion. No pneumothorax.  Musculoskeletal: No chest wall mass or suspicious bone lesions identified.  CT ABDOMEN PELVIS FINDINGS  Hepatobiliary: Decreased conspicuity of the low-attenuation lesion in the hepatic dome measuring approximately 1.4 cm on image 43/2, previously characterized as a benign hepatic hemangioma on MRI June 08, 2018. No new suspicious hepatic lesions. Gallbladder surgically absent. Prominence of the biliary tree, favor reservoir effect post cholecystectomy.  Pancreas: Similar size of the fluid attenuation lesion in the pancreatic uncinate measuring 2.5 x 1.6 cm on image 60/2 previously 2.5 x 1.7 cm. No pancreatic ductal dilation. No pancreatic inflammatory change.  Spleen: Splenic granulomata.  Adrenals/Urinary Tract: Adrenal glands are unremarkable.  No hydronephrosis. Similar size of the partially exophytic soft tissue attenuating lesion in the posterior interpolar right kidney measuring 3.1 by 2.4 cm on image 64/2 previously 3.5 x 2.3 cm. Left kidney is unremarkable.  Urinary bladder is grossly  unremarkable for degree of distension.  Stomach/Bowel: Radiopaque enteric contrast traverses the descending colon. Stomach is grossly unremarkable. No pathologic dilation of small bowel. Appendix is surgically absent. Sigmoid colonic diverticulosis without findings of acute diverticulitis.  Vascular/Lymphatic: Aortic and branch vessel atherosclerosis without abdominal aortic aneurysm. No pathologically enlarged abdominal or pelvic lymph nodes.  Reproductive: Status post hysterectomy. No adnexal masses.  Other: No abdominal wall hernia or abnormality. No abdominopelvic ascites.  Musculoskeletal: Prior L4-L5 discectomy and fusion. Stable benign bone island in the L4 vertebral body. No aggressive lytic or blastic lesion of bone visualized.  IMPRESSION: 1. No definitive evidence of metastatic disease in the chest abdomen or pelvis. 2. 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 3. Interval increased centrilobular and tree-in-bud nodularity in the left lower lobe, superimposed on the extensive clustered centrilobular and tree-in-bud nodularity as well as focal areas of bronchiectasis and bandlike scarring. Findings are most consistent with atypical infection, particularly atypical mycobacteria, with the new findings of nodularity suggestive of persistent/on going infection.. 4. Similar size of the 2.5 cm fluid attenuation lesion in the pancreatic uncinate process, this lesion has very slightly enlarged over a long period of time dating back to 2009 and is most consistent with an IPMN. Continued attention on follow-up imaging is suggested. However no further specific follow up is warranted for this lesion. This recommendation follows ACR consensus guidelines: Management of Incidental  Pancreatic Cysts: A White Paper of the ACR Incidental Findings Committee. J Am Coll Radiol 0017;49:449-675. 5. 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 Continued attention on follow-up oncologic imaging in this patient is suggested. This recommendation follows 2010 ACCF/AHA/AATS/ACR/ASA/SCA/SCAI/SIR/STS/SVM Guidelines for the Diagnosis and Management of Patients With Thoracic Aortic Disease. Circulation. 2010; 121: F163-W466. Aortic aneurysm NOS (ICD10-I71.9) 6. Coronary artery calcifications. Aortic Atherosclerosis (ICD10-I70.0).  Allergies:  Allergies  Allergen Reactions   Amoxicillin-Pot Clavulanate Nausea Only    Other reaction(s): Other (See Comments) (GI)   Codeine     Vomiting  Other reaction(s): GI Upset (intolerance) (GI)   Avelox [Moxifloxacin Hcl In Nacl]     Upset stomach  Bupropion     Other reaction(s): Mental Status Changes (intolerance) aggitation   Chlorpheniramine     Other reaction(s): Other (See Comments) Other   Clarithromycin     Upset stomach    Cymbalta [Duloxetine Hcl]     Blurred vision    Metronidazole And Related     Rash, itching    Moxifloxacin     Upset stomach    Amoxicillin-Pot Clavulanate Nausea And Vomiting   Clarithromycin Diarrhea    Current Medications: Current Outpatient Medications  Medication Sig Dispense Refill   acetaminophen (TYLENOL) 325 MG tablet Take 650 mg by mouth every 6 (six) hours as needed for mild pain.      ARIPiprazole (ABILIFY) 5 MG tablet Take 1 tablet (5 mg total) by mouth daily. 30 tablet 1   aspirin 81 MG chewable tablet Chew by mouth daily.     calcium carbonate (TUMS - DOSED IN MG ELEMENTAL CALCIUM) 500 MG chewable tablet Chew 1 tablet by mouth daily as needed for indigestion or heartburn.     carvedilol (COREG) 12.5 MG tablet Take 25 mg by mouth 2 (two) times daily.      ergocalciferol (VITAMIN D2) 1.25 MG (50000 UT) capsule TAKE 1 CAPSULE ONCE A WEEK     gabapentin (NEURONTIN) 400  MG capsule Take 400 mg by mouth 3 (three) times daily.      LACTOBACILLUS RHAMNOSUS, GG, PO Take by mouth.     levocetirizine (XYZAL) 5 MG tablet Take 5 mg by mouth every evening.     levothyroxine (SYNTHROID, LEVOTHROID) 50 MCG tablet Take 1.5 tablets (75 mcg total) by mouth daily. (Patient taking differently: Take 50 mcg by mouth daily. ) 60 tablet 0   Melatonin 5 MG TABS Take by mouth at bedtime as needed.     Multiple Vitamin (MULTIVITAMIN) tablet Take 1 tablet by mouth daily.     NEUPRO 6 MG/24HR 1 patch daily.     Omega-3 Fatty Acids (OMEGA-3 FISH OIL PO) Take by mouth.     predniSONE (DELTASONE) 5 MG tablet Take 5 mg by mouth daily.      rOPINIRole (REQUIP) 3 MG tablet Take 3 mg by mouth at bedtime. Take 2 tablets by mouth every evening at bedtime.      sertraline (ZOLOFT) 50 MG tablet Take 1.5 tablets (75 mg total) by mouth daily. 45 tablet 1   traMADol (ULTRAM) 50 MG tablet Take 50 mg by mouth every 12 (twelve) hours as needed. Taking every day per patient     No current facility-administered medications for this visit.     ASSESSMENT & PLAN:   Assessment:   1. Stage IIA triple negative breast cancer now 15 years postop with no evidence of disease.  She was treated with surgery, radiation and chemotherapy.  2. Diagnosis of polymyositis.  3. Multiple pulmonary nodules felt to be secondary to her polymyositis, stable.  4. 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  5. Stable cystic lesion in the uncinate process of the pancreas.  6. 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  In view of its mild and slow enlargement, I see no urgency for cryoablation.  7. Stable lesion in the dome of the liver consistent with hemangioma by MRI scan.    8. Osteoporosis.  9. 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  10.  Hyponatremia, worse.  I advised that  she  increase her sodium intake.    Plan: We reviewed the results of her CT imaging in detail and I answered her questions and gave her a copy.  Annual bilateral mammogram is scheduled for July 29th.  I will see her back again in 1 year with CBC, comprehensive metabolic profile, bilateral mammogram and CT scan of chest, abdomen and pelvis for continues surveillance.  She understands and agrees with this plan of care.     I provided 15 minutes of face-to-face time during this this encounter and > 50% was spent counseling as documented under my assessment and plan.    Derwood Kaplan, MD [facility] [address] [place] [address] Dept: [phone] Dept Fax: [phone]   I, Rita Ohara, am acting as scribe for Derwood Kaplan, MD  I have reviewed this report as typed by the medical scribe, and it is complete and accurate.

## 2021-01-28 NOTE — Telephone Encounter (Signed)
Patient called to verify 7/22 Follow UP Appt.  Her original Appt for 7/7 was Canceled.  Patient was not scheduled for another Appt.  Made an Appt on 7/22 at 1:00 pm.  If this is not convenient, please let me know

## 2021-02-01 ENCOUNTER — Other Ambulatory Visit: Payer: Self-pay | Admitting: Oncology

## 2021-02-01 ENCOUNTER — Inpatient Hospital Stay: Payer: Medicare Other | Attending: Oncology | Admitting: Oncology

## 2021-02-01 ENCOUNTER — Encounter: Payer: Self-pay | Admitting: Oncology

## 2021-02-01 ENCOUNTER — Other Ambulatory Visit: Payer: Self-pay

## 2021-02-01 VITALS — BP 193/95 | HR 65 | Temp 98.5°F | Resp 18 | Ht 62.0 in | Wt 126.3 lb

## 2021-02-01 DIAGNOSIS — Z853 Personal history of malignant neoplasm of breast: Secondary | ICD-10-CM

## 2021-02-06 ENCOUNTER — Ambulatory Visit: Payer: Medicare Other | Admitting: Oncology

## 2021-02-10 ENCOUNTER — Other Ambulatory Visit: Payer: Self-pay | Admitting: Oncology

## 2021-02-10 ENCOUNTER — Encounter: Payer: Self-pay | Admitting: Oncology

## 2021-02-10 DIAGNOSIS — Z853 Personal history of malignant neoplasm of breast: Secondary | ICD-10-CM

## 2021-02-10 DIAGNOSIS — I714 Abdominal aortic aneurysm, without rupture, unspecified: Secondary | ICD-10-CM | POA: Insufficient documentation

## 2021-02-10 DIAGNOSIS — C641 Malignant neoplasm of right kidney, except renal pelvis: Secondary | ICD-10-CM

## 2021-02-10 DIAGNOSIS — R918 Other nonspecific abnormal finding of lung field: Secondary | ICD-10-CM

## 2021-02-10 DIAGNOSIS — D490 Neoplasm of unspecified behavior of digestive system: Secondary | ICD-10-CM | POA: Insufficient documentation

## 2021-02-10 DIAGNOSIS — Z1239 Encounter for other screening for malignant neoplasm of breast: Secondary | ICD-10-CM

## 2021-02-13 ENCOUNTER — Encounter: Payer: Self-pay | Admitting: Oncology

## 2021-02-14 ENCOUNTER — Telehealth: Payer: Self-pay

## 2021-02-14 NOTE — Telephone Encounter (Signed)
Patient notified

## 2021-02-14 NOTE — Telephone Encounter (Signed)
-----   Message from Derwood Kaplan, MD sent at 02/12/2021  2:50 PM EDT ----- Regarding: call pt Tell her mammo is clear

## 2021-02-15 ENCOUNTER — Encounter: Payer: Self-pay | Admitting: Oncology

## 2021-02-25 ENCOUNTER — Other Ambulatory Visit: Payer: Self-pay | Admitting: Oncology

## 2021-02-28 ENCOUNTER — Encounter (HOSPITAL_BASED_OUTPATIENT_CLINIC_OR_DEPARTMENT_OTHER): Payer: Self-pay | Admitting: Internal Medicine

## 2021-02-28 ENCOUNTER — Other Ambulatory Visit: Payer: Self-pay

## 2021-02-28 ENCOUNTER — Ambulatory Visit: Payer: Medicare Other | Attending: Internal Medicine | Admitting: Internal Medicine

## 2021-02-28 VITALS — BP 118/60 | HR 58 | Ht 62.0 in | Wt 75.8 lb

## 2021-02-28 DIAGNOSIS — E782 Mixed hyperlipidemia: Secondary | ICD-10-CM | POA: Insufficient documentation

## 2021-02-28 DIAGNOSIS — Z0000 Encounter for general adult medical examination without abnormal findings: Secondary | ICD-10-CM | POA: Insufficient documentation

## 2021-02-28 DIAGNOSIS — J320 Chronic maxillary sinusitis: Secondary | ICD-10-CM | POA: Insufficient documentation

## 2021-02-28 DIAGNOSIS — R634 Abnormal weight loss: Secondary | ICD-10-CM | POA: Insufficient documentation

## 2021-02-28 DIAGNOSIS — E039 Hypothyroidism, unspecified: Secondary | ICD-10-CM | POA: Insufficient documentation

## 2021-02-28 LAB — URINALYSIS, MACRO/MICRO
BILIRUBIN: NEGATIVE mg/dL
BLOOD: NEGATIVE mg/dL
COLOR: NORMAL
GLUCOSE: NORMAL mg/dL
KETONES: NEGATIVE mg/dL
LEUKOCYTES: 25 WBCs/uL — AB
NITRITE: NEGATIVE
PH: 7 (ref 5.0–8.0)
PROTEIN: NEGATIVE mg/dL
SPECIFIC GRAVITY: 1.011 (ref 1.005–1.030)
UROBILINOGEN: NORMAL mg/dL

## 2021-02-28 LAB — COMPREHENSIVE METABOLIC PANEL, NON-FASTING
ALBUMIN: 3.5 g/dL (ref 3.2–4.8)
ALKALINE PHOSPHATASE: 46 U/L (ref 20–130)
ALT (SGPT): 5 U/L (ref <=52)
ANION GAP: 15 mmol/L
AST (SGOT): 12 U/L (ref <=35)
BILIRUBIN TOTAL: 1.3 mg/dL — ABNORMAL HIGH (ref 0.3–1.2)
BUN/CREA RATIO: 20
BUN: 13 mg/dL (ref 10–25)
CALCIUM: 8.2 mg/dL — ABNORMAL LOW (ref 8.8–10.3)
CHLORIDE: 84 mmol/L — ABNORMAL LOW (ref 98–111)
CO2 TOTAL: 23 mmol/L (ref 21–35)
CREATININE: 0.65 mg/dL (ref <=1.30)
ESTIMATED GFR: >60 mL/min/{1.73_m2}
GLUCOSE: 86 mg/dL (ref 70–110)
POTASSIUM: 3.3 mmol/L — ABNORMAL LOW (ref 3.5–5.0)
PROTEIN TOTAL: 6.1 g/dL (ref 6.0–8.3)
SODIUM: 122 mmol/L — ABNORMAL LOW (ref 135–145)

## 2021-02-28 LAB — THYROXINE, FREE (FREE T4): THYROXINE (T4), FREE: 1.87 ng/dL — ABNORMAL HIGH (ref 0.61–1.12)

## 2021-02-28 LAB — CBC
HCT: 42.2 % (ref 34.8–46.0)
HGB: 13.8 g/dL (ref 11.5–16.0)
MCH: 29.7 pg (ref 26.0–32.0)
MCHC: 32.7 g/dL (ref 31.0–35.5)
MCV: 90.8 fL (ref 78.0–100.0)
MPV: 10 fL (ref 8.7–12.5)
PLATELETS: 236 10*3/uL (ref 150–400)
RBC: 4.65 10*6/uL (ref 3.85–5.22)
RDW-CV: 12.8 % (ref 11.5–15.5)
WBC: 6.1 10*3/uL (ref 3.7–11.0)

## 2021-02-28 LAB — LIPID PANEL
CHOLESTEROL: 210 mg/dL — ABNORMAL HIGH (ref 0–199)
HDL CHOL: 69 mg/dL (ref >40)
LDL DIRECT: 140 mg/dL — ABNORMAL HIGH (ref 0–99)
TRIGLYCERIDES: 76 mg/dL (ref 0–199)
VLDL CALC: 15 mg/dL (ref 0–50)

## 2021-02-28 LAB — THYROID STIMULATING HORMONE (SENSITIVE TSH): TSH: 0.471 u[IU]/mL (ref 0.450–5.330)

## 2021-02-28 MED ORDER — CEFUROXIME AXETIL 250 MG TABLET
250.0000 mg | ORAL_TABLET | Freq: Two times a day (BID) | ORAL | 0 refills | Status: AC
Start: 2021-02-28 — End: 2021-03-07

## 2021-02-28 MED ORDER — IBUPROFEN 800 MG TABLET
800.00 mg | ORAL_TABLET | Freq: Three times a day (TID) | ORAL | 3 refills | Status: AC | PRN
Start: 2021-02-28 — End: ?

## 2021-02-28 NOTE — Nursing Note (Signed)
Labs obtained from left [facility], patient tolerated well.    [place], [place]  02/28/2021, 09:57

## 2021-02-28 NOTE — H&P (Signed)
INTERNAL MEDICINE, PHYSICIAN OFFICE BUILDING  [address]  [name] [address]  Operated by [facility]  Medicare Annual Wellness Visit    Name: [name] MRN:  [id number]   Date: 02/28/2021 Age: 86 y.o.       SUBJECTIVE:   [name] is a 86 y.o. female for presenting for Medicare Wellness exam.  No new surgeries.  No new medicines.  No new allergies to medicines.  Appetite been fair.  Weight is down another 5 lb.  We discussed that at length today.  Physically active.  Mentally active.  I have reviewed and reconciled the medication list with the patient today.    I have reviewed and updated as appropriate the past medical, family and social history. 02/28/2021 as summarized below:  Past Medical History:   Diagnosis Date   . Fibromyalgia    . Hypercholesterolemia      History reviewed. No pertinent surgical history.  Current Outpatient Medications   Medication Sig   . cefuroxime (CEFTIN) 250 mg Oral Tablet Take 1 Tablet (250 mg total) by mouth Twice daily for 7 days   . Ibuprofen (MOTRIN) 800 mg Oral Tablet Take 1 Tablet (800 mg total) by mouth Three times a day as needed for Pain   . levothyroxine (SYNTHROID) 75 mcg Oral Tablet Take 1 Tablet (75 mcg total) by mouth Every morning     Family Medical History:     Problem Relation (Age of Onset)    Hypertension (High Blood Pressure) Mother, Father          Social History     Socioeconomic History   . Marital status: Widowed   Tobacco Use   . Smoking status: Never Smoker   . Smokeless tobacco: Never Used   Substance and Sexual Activity   . Drug use: Never   . Sexual activity: Not Currently       REVIEW OF SYSTEMS:  CARDIAC:  No chest pain, DOE, or palpitations. No orthopnea or PND.    PULMONARY:  No cough, sputum, or hemoptysis. No fever,chills, or night sweats.    GI:  No nausea or vomiting. No abdominal pain. No change in bowel habits          No melena or bright red rectal bleeding.    GU :  Voiding without difficulty     NEURO:  No headache , diplopia or loss of function of limbs.     List of Current Health Care Providers   Care Team     PCP     Name Type Specialty Phone Number    Marylee Floras, MD Physician INTERNAL MEDICINE [phone]          Care Team     No care team found                  Health Maintenance   Topic Date Due   . Osteoporosis screening  Never done   . Adult Tdap-Td (1 - Tdap) Never done   . Shingles Vaccine (1 of 2) Never done   . Pneumococcal Vaccination, Age 35+ (1 - PCV) Never done   . Covid-19 Vaccine (4 - Booster) 10/31/2020   . Influenza Vaccine (1) 03/14/2021   . Depression Screening  02/28/2022   . Annual Wellness Visit  02/28/2022   . Meningococcal Vaccine  Aged Out     Medicare Wellness Assessment   Medicare initial or wellness physical in the last year?: No  Advance Directives (optional)   Does patient have a living will or MPOA: no   Has patient provided [employer] with a copy?: no   Advance directive information given to the patient today?: no      Activities of Daily Living   Do you need help with dressing, bathing, or walking?: No   Do you need help with shopping, housekeeping, medications, or finances?: No   Do you have rugs in hallways, broken steps, or poor lighting?: No   Do you have grab bars in your bathroom, non-slip strips in your tub, and hand rails on your stairs?: Yes   Urinary Incontinence Screen (Women >=65 only)   Do you ever leak urine when you don't want to?: No   Cognitive Function Screen (1=Yes, 0=No)   What is you age?: Correct   What is the time to the nearest hour?: Correct   What is the year?: Correct   What is the name of this clinic?: Correct   Can the patient recognize two persons (the doctor, the nurse, home help, etc.)?: Correct   What is the date of your birth? (day and month sufficient) : Correct   In what year did World War II end?: Correct   Who is the current president of the [place]?: Correct   Count from 20 down to 1?: Correct   What address did I give  you earlier?: Correct   Total Score: 10   Hearing Screen   Have you noticed any hearing difficulties?: Yes  After whispering 9-1-6 how many numbers did the patient repeat correctly?: 3   Fall Risk Screen   Do you feel unsteady when standing or walking?: Yes  Do you worry about falling?: Yes  Have you fallen in the past year?: No   Vision Screen   Right Eye = 20: 100   Left Eye = 20: 100   Depression Screen   Little interest or pleasure in doing things.: Not at all  Feeling down, depressed, or hopeless: Not at all  PHQ 2 Total: 0        OBJECTIVE:   Physical Exam:  BP 118/60   Pulse 58   Ht 1.575 m (5\' 2" )   Wt (!) 34.4 kg (75 lb 12.8 oz)   LMP  (LMP Unknown)   SpO2 95%   BMI 13.86 kg/m       General appearance: alert, oriented x 3, in her normal state, cooperative, not in apparent distress, appearing stated age   Neck: No lymphadenopathy or thyroid anomalies noted, carotids 2 + bilaterally without bruit  Lungs: clear to auscultation bilaterally. No crackles or wheeze noted. Normal respiratory effort  Heart: No JVD. Regular rate and rhythm, S1, S2 normal.  No gallop or rub. No murmur  Abdomen: soft, non-tender. Bowel sounds normal. No hepatosplenomegaly.  Extremities: no cyanosis or edema.  Pulses intact.  No calf pain    Health Maintenance Due   Topic Date Due   . Osteoporosis screening  Never done   . Adult Tdap-Td (1 - Tdap) Never done   . Shingles Vaccine (1 of 2) Never done   . Pneumococcal Vaccination, Age 58+ (1 - PCV) Never done   . Covid-19 Vaccine (4 - Booster) 10/31/2020       ASSESSMENT & PLAN:    1. Medicare annual wellness visit, subsequent  Completed    2. Weight loss  Discussed eating 1:00 a.m. says it is time to eat not when she is home  -  CBC; Future  - COMPREHENSIVE METABOLIC PANEL, NON-FASTING; Future  - LIPID PANEL; Future  - THYROXINE, FREE (FREE T4); Future  - THYROID STIMULATING HORMONE (SENSITIVE TSH); Future  - URINALYSIS WITH REFLEX MICROSCOPIC AND CULTURE IF POSITIVE; Future    3.  Mixed hyperlipidemia  Check lipid panel.  Again weight loss more concerning  - CBC; Future  - COMPREHENSIVE METABOLIC PANEL, NON-FASTING; Future  - LIPID PANEL; Future  - THYROXINE, FREE (FREE T4); Future  - THYROID STIMULATING HORMONE (SENSITIVE TSH); Future  - URINALYSIS WITH REFLEX MICROSCOPIC AND CULTURE IF POSITIVE; Future    4. Acquired hypothyroidism  Adjust medicines as  - THYROXINE, FREE (FREE T4); Future  - THYROID STIMULATING HORMONE (SENSITIVE TSH); Future    5. Maxillary sinusitis  Ceftin 250 mg       Identified Risk Factors/ Recommended Actions   BMI addressed: Advised on importance of nutrition to increase below normal BMI.     Fall Risk Follow up plan of care: Discussed optimizing home safety      Orders Placed This Encounter   . CBC   . COMPREHENSIVE METABOLIC PANEL, NON-FASTING   . LIPID PANEL   . THYROXINE, FREE (FREE T4)   . THYROID STIMULATING HORMONE (SENSITIVE TSH)   . URINALYSIS WITH REFLEX MICROSCOPIC AND CULTURE IF POSITIVE   . Ibuprofen (MOTRIN) 800 mg Oral Tablet   . cefuroxime (CEFTIN) 250 mg Oral Tablet          The patient has been educated about risk factors and recommended preventive care. Written Prevention Plan completed/ updated and given to patient (see After Visit Summary).    Return in about 6 months (around 08/31/2021).    Marylee Floras, MD  [facility] PHYSICIAN OFFICE BUILDING  INTERNAL MEDICINE, PHYSICIAN OFFICE BUILDING  [address]  [name] [address]

## 2021-03-01 ENCOUNTER — Other Ambulatory Visit (HOSPITAL_BASED_OUTPATIENT_CLINIC_OR_DEPARTMENT_OTHER): Payer: Self-pay | Admitting: Internal Medicine

## 2021-03-01 DIAGNOSIS — E876 Hypokalemia: Secondary | ICD-10-CM

## 2021-03-01 LAB — URINE CULTURE,ROUTINE: URINE CULTURE: NO GROWTH

## 2021-03-01 MED ORDER — POTASSIUM CHLORIDE ER 10 MEQ TABLET,EXTENDED RELEASE
10.0000 meq | ORAL_TABLET | Freq: Every day | ORAL | 11 refills | Status: DC
Start: 2021-03-01 — End: 2023-02-05

## 2021-03-14 ENCOUNTER — Other Ambulatory Visit (HOSPITAL_BASED_OUTPATIENT_CLINIC_OR_DEPARTMENT_OTHER): Payer: Self-pay | Admitting: Internal Medicine

## 2021-03-14 MED ORDER — LACTULOSE 10 GRAM/15 ML ORAL SOLUTION
30.0000 mL | Freq: Every day | ORAL | 1 refills | Status: DC
Start: 2021-03-14 — End: 2023-02-05

## 2021-04-22 ENCOUNTER — Other Ambulatory Visit: Payer: Self-pay

## 2021-04-22 ENCOUNTER — Emergency Department (HOSPITAL_COMMUNITY): Payer: Medicare Other

## 2021-04-22 ENCOUNTER — Emergency Department
Admission: EM | Admit: 2021-04-22 | Discharge: 2021-04-22 | Disposition: A | Discharge status: Home / Self Care | Payer: Medicare Other | Attending: NURSE PRACTITIONER | Admitting: NURSE PRACTITIONER

## 2021-04-22 DIAGNOSIS — R200 Anesthesia of skin: Secondary | ICD-10-CM | POA: Insufficient documentation

## 2021-04-22 LAB — URINALYSIS, MACRO/MICRO
BILIRUBIN: NEGATIVE mg/dL
BLOOD: NEGATIVE mg/dL
COLOR: NORMAL
GLUCOSE: NORMAL mg/dL
KETONES: 40 mg/dL — AB
LEUKOCYTES: NEGATIVE WBCs/uL
NITRITE: NEGATIVE
PH: 5.5 (ref 5.0–8.0)
PROTEIN: NEGATIVE mg/dL
SPECIFIC GRAVITY: 1.008 (ref 1.005–1.030)
UROBILINOGEN: NORMAL mg/dL

## 2021-04-22 LAB — BASIC METABOLIC PANEL
ANION GAP: 7 mmol/L
BUN/CREA RATIO: 21
BUN: 14 mg/dL (ref 10–25)
CALCIUM: 9.5 mg/dL (ref 8.8–10.3)
CHLORIDE: 100 mmol/L (ref 98–111)
CO2 TOTAL: 29 mmol/L (ref 21–35)
CREATININE: 0.66 mg/dL (ref <=1.30)
ESTIMATED GFR: >60 mL/min/{1.73_m2}
GLUCOSE: 99 mg/dL (ref 70–110)
POTASSIUM: 4.4 mmol/L (ref 3.5–5.0)
SODIUM: 136 mmol/L (ref 135–145)

## 2021-04-22 LAB — CBC WITH DIFF
BASOPHIL #: <0.1 10*3/uL (ref <=0.20)
BASOPHIL %: 1 %
EOSINOPHIL #: <0.1 10*3/uL (ref <=0.50)
EOSINOPHIL %: 1 %
HCT: 41.1 % (ref 34.8–46.0)
HGB: 14 g/dL (ref 11.5–16.0)
IMMATURE GRANULOCYTE #: <0.1 10*3/uL (ref <0.10)
IMMATURE GRANULOCYTE %: 0 % (ref 0–1)
LYMPHOCYTE #: 2.03 10*3/uL (ref 1.00–4.80)
LYMPHOCYTE %: 24 %
MCH: 30.7 pg (ref 26.0–32.0)
MCHC: 34.1 g/dL (ref 31.0–35.5)
MCV: 90.1 fL (ref 78.0–100.0)
MONOCYTE #: 0.82 10*3/uL (ref 0.20–1.10)
MONOCYTE %: 10 %
MPV: 9.3 fL (ref 8.7–12.5)
NEUTROPHIL #: 5.37 10*3/uL (ref 1.50–7.70)
NEUTROPHIL %: 64 %
PLATELETS: 244 10*3/uL (ref 150–400)
RBC: 4.56 10*6/uL (ref 3.85–5.22)
RDW-CV: 12.6 % (ref 11.5–15.5)
WBC: 8.4 10*3/uL (ref 3.7–11.0)

## 2021-04-22 LAB — HEPATIC FUNCTION PANEL
ALBUMIN: 4.5 g/dL (ref 3.2–4.8)
ALKALINE PHOSPHATASE: 62 U/L (ref 20–130)
ALT (SGPT): 5 U/L (ref <=52)
AST (SGOT): 15 U/L (ref <=35)
BILIRUBIN DIRECT: 0.2 mg/dL (ref <=0.3)
BILIRUBIN TOTAL: 1.1 mg/dL (ref 0.3–1.2)
PROTEIN TOTAL: 6.9 g/dL (ref 6.0–8.3)

## 2021-04-22 NOTE — ED Nurses Note (Signed)
Discharge instructions given, pt verbalized and understanding.  Pt discharged home.

## 2021-04-22 NOTE — ED Provider Notes (Signed)
Department of Emergency [place]  HPI - 04/22/2021      Advanced Practice Provider: Emelda Brothers, FNP  Attending Physician: Dr. Rosiland Oz    Chief Complaint: Numbness    HPI  [name] is a 86 y.o. female who presents to the ED via POV with c/o numbness. Patient complains of an area of numbness to her posterior scalp between her two ears. She reports h/o similar symptoms which usually resolve on their own, however notes her symptoms today have persisted.    Patient denies any vision changes, headache, and any other complaints or concerns at this time.     History Limitations: None    Review of Systems  Constitutional: No fever, chills or weakness   Skin: No rash or diaphoresis  HENT: No headaches or congestion  Eyes: No vision changes   Cardio: No chest pain, palpitations or leg swelling   Respiratory: No cough, wheezing or SOB  GI:  No abdominal pain, nausea, vomiting or stool changes  GU:  No urinary changes  MSK: No joint or back pain  Neuro: No seizures or LOC +Numbness  All other systems reviewed and are negative.    History:   PMH:    Past Medical History:   Diagnosis Date    Fibromyalgia     Hypercholesterolemia          PSH:  No past surgical history pertinent negatives on file.      Social Hx:    Social History     Socioeconomic History    Marital status: Widowed     Spouse name: Not on file    Number of children: Not on file    Years of education: Not on file    Highest education level: Not on file   Occupational History    Not on file   Tobacco Use    Smoking status: Never    Smokeless tobacco: Never   Substance and Sexual Activity    Alcohol use: Not on file    Drug use: Never    Sexual activity: Not Currently   Other Topics Concern    Not on file   Social History Narrative    Not on file     Social Determinants of Health     Financial Resource Strain: Not on file   Food Insecurity: Not on file   Transportation Needs: Not on file   Physical Activity:  Not on file   Stress: Not on file   Intimate Partner Violence: Not on file   Housing Stability: Not on file     Family Hx:   Family History   Problem Relation Age of Onset    Hypertension (High Blood Pressure) Mother     Hypertension (High Blood Pressure) Father      Allergies:   Allergies   Allergen Reactions    Grass Pollen      Medications:   Prior to Admission Medications   Prescriptions Last Dose Informant Patient Reported? Taking?   Ibuprofen (MOTRIN) 800 mg Oral Tablet   No No   Sig: Take 1 Tablet (800 mg total) by mouth Three times a day as needed for Pain   lactulose (ENULOSE) 10 gram/15 mL Oral Solution   No No   Sig: Take 30 mL by mouth Once a day   levothyroxine (SYNTHROID) 75 mcg Oral Tablet   No No   Sig: Take 1 Tablet (75 mcg total) by mouth Every morning   potassium  chloride (KLOR-CON) 10 mEq Oral Tablet Sustained Release   No No   Sig: Take 1 Tablet (10 mEq total) by mouth Once a day      Facility-Administered Medications: None       Above history reviewed with patient, changes are as documented.    Physical Exam   Nursing notes reviewed.    Filed Vitals:    04/22/21 1314   BP: (!) 140/66   Pulse: 95   Resp: 18   Temp: 36.4 C (97.5 F)   SpO2: 98%      Constitutional: NAD. Oriented  HENT:   Head: Mild depression in the superior aspect of occipital area, likely suture that's failed to close completely. Normocephalic and atraumatic.   Mouth/Throat: Oropharynx is clear and moist.   Eyes: EOMI, PERRL   Neck: Trachea midline. Neck supple.  Cardiovascular: Regular rate and rhythm. No murmurs, rubs or gallops. Intact distal pulses.  Pulmonary/Chest: BS equal bilaterally. No respiratory distress. No wheezes, rales or chest tenderness.   GI: BS +. Abdomen soft, no tenderness, rebound or guarding.      Musculoskeletal: No edema, tenderness or deformity.  Skin: Warm and dry. No rash, erythema, pallor or cyanosis  Psychiatric: Normal mood and affect. Behavior is normal.   Neurological: Alert. Grossly  intact.    Course  Orders, Abnormal Labs and Imaging Results:  Results up to the Time the Disposition was Entered   URINALYSIS, MACRO/MICRO - Abnormal; Notable for the following components:       Result Value    KETONES 40 (*)     All other components within normal limits   HEPATIC FUNCTION PANEL - Normal   BASIC METABOLIC PANEL    Narrative:     Estimated Glomerular Filtration Rate (eGFR) is calculated using the CKD-EPI (2021) equation, intended for patients 96 years of age and older. If gender is not documented or "unknown", there will be no eGFR calculation.   CBC/DIFF    Narrative:     The following orders were created for panel order CBC/DIFF.  Procedure                               Abnormality         Status                     ---------                               -----------         ------                     CBC WITH CLEX[[id number]]                                    Final result                 Please view results for these tests on the individual orders.   URINALYSIS WITH REFLEX MICROSCOPIC AND CULTURE IF POSITIVE    Narrative:     The following orders were created for panel order URINALYSIS WITH REFLEX MICROSCOPIC AND CULTURE IF POSITIVE.  Procedure  Abnormality         Status                     ---------                               -----------         ------                     URINALYSIS, MACRO/MICRO[[id number]]      Abnormal            Final result                 Please view results for these tests on the individual orders.   CBC WITH DIFF   CT BRAIN WO IV CONTRAST    Narrative:     Cinthia L Lemley    PROCEDURE DESCRIPTION: CT BRAIN WO IV CONTRAST    CLINICAL INDICATION: numbness of scalp    COMPARISON: 12/25/2006      FINDINGS: There is no acute intracranial hemorrhage, mass, midline shift, or extra-axial fluid collection. The ventricles are normal in size. There is no loss of gray-white differentiation to suggest an acute infarct. Periventricular and subcortical  white matter lucencies are nonspecific but likely related to chronic microvascular ischemic change. The paranasal sinuses and mastoid air cells are well pneumatized.       MDM:   Therapy/Procedures/Course/MDM:    Patient was vitally stable throughout visit.     Imaging: As above.   Lab results: As above.   Results discussed with patient.     Advised the patient return to the ED if patient begins to develop any worsening symptoms.   Pt voiced understanding and was agreeable to the plan   she was given the opportunity to ask questions.             Consults: None  Impression:   Encounter Diagnosis   Name Primary?    Numbness Yes     Disposition:  Discharged   Discharge: Following the above history, physical exam, and studies, the patient was deemed stable and suitable for discharge.  It was advised that the patient return to the ED if they develop new or any other concerning symptoms and follow up as directed.   The patient verbalized understanding of all instructions and had no further questions or concerns.  Follow Up:   Filbert Berthold, MD  [place] [address]  [phone]    Schedule an appointment as soon as possible for a visit       [facility] - Emergency Department  [place] [address]  [phone]    If symptoms worsen    Prescriptions:   Discharge Medication List as of 04/22/2021  3:51 PM           Future Appointments   Date Time Provider [place]   09/04/2021  9:00 AM Angotti, Alphonse Guild, MD [facility]   03/05/2022  9:00 AM Angotti, Alphonse Guild, MD [facility]       The co-signing faculty was physically present in the emergency department and available for consultation and did not participate in the care of this patient.      I am scribing for, and in the presence of, Emelda Brothers, FNP for services provided on  04/22/2021.  Maggie Petitto, SCRIBE   Maggie Petitto, [place]  04/22/2021,  14:46    I personally performed the services described in this documentation, as scribed  in my presence, and it is both accurate  and complete.    Aline August, [place], FNP  04/22/2021, 18:42

## 2021-04-22 NOTE — Discharge Instructions (Signed)
No reason was found for the numbness of your scalp.  Please follow-up with your family doctor.  If you develop any headaches, blurred vision, dizziness, or weakness please return to the emergency department for further evaluation.

## 2021-04-23 ENCOUNTER — Telehealth (HOSPITAL_COMMUNITY): Payer: Self-pay | Admitting: Internal Medicine

## 2021-04-23 NOTE — Telephone Encounter (Signed)
Post Ed Follow-Up    Post ED Follow-Up:   Document completed and/or attempted interactive contact(s) after transition to home after emergency department stay.:   Transition Facility and relevant Date:   Discharge Date: 04/22/21  Discharge from [facility] Emergency Department?: Yes  Discharge Facility: [facility]  Contacted by: Morton Peters  Contact method: Patient/Caregiver Telephone  Contact first attempt: 04/23/2021  2:36 PM  MyChart message sent?: No  Interventions: no answer-unable to leave message

## 2021-05-02 ENCOUNTER — Other Ambulatory Visit (HOSPITAL_BASED_OUTPATIENT_CLINIC_OR_DEPARTMENT_OTHER): Payer: Self-pay | Admitting: Internal Medicine

## 2021-05-02 NOTE — Telephone Encounter (Signed)
Diflucan 100 mg # 7 1 po daily  nrf

## 2021-05-02 NOTE — Telephone Encounter (Signed)
[name] called and stated she has thrush in her mouth. She also has a yeast infection and she would like something sent to her pharmacy.   Delfin Gant, MA  05/02/2021, 15:33

## 2021-05-03 ENCOUNTER — Other Ambulatory Visit (HOSPITAL_BASED_OUTPATIENT_CLINIC_OR_DEPARTMENT_OTHER): Payer: Self-pay | Admitting: Internal Medicine

## 2021-05-03 MED ORDER — NYSTATIN 100,000 UNIT/GRAM TOPICAL POWDER
Freq: Two times a day (BID) | CUTANEOUS | 11 refills | Status: DC
Start: 2021-05-03 — End: 2023-02-05

## 2021-05-03 MED ORDER — FLUCONAZOLE 100 MG TABLET
100.0000 mg | ORAL_TABLET | Freq: Every day | ORAL | 0 refills | Status: DC
Start: 2021-05-03 — End: 2023-02-05

## 2021-05-03 NOTE — Telephone Encounter (Signed)
Nystatin powder.  Dispense 100 g. Apply b.i.d..  Eleven refills.

## 2021-05-03 NOTE — Telephone Encounter (Signed)
[name] is requesting Nystatin powder for her yeast infection.      Please advise     Ruta Hinds, MA  05/03/2021, 11:09

## 2021-05-10 ENCOUNTER — Other Ambulatory Visit: Payer: Self-pay

## 2021-05-10 ENCOUNTER — Ambulatory Visit: Payer: Medicare Other | Attending: Internal Medicine

## 2021-05-10 ENCOUNTER — Other Ambulatory Visit (HOSPITAL_COMMUNITY): Payer: Medicare Other | Admitting: Internal Medicine

## 2021-05-10 ENCOUNTER — Other Ambulatory Visit (HOSPITAL_BASED_OUTPATIENT_CLINIC_OR_DEPARTMENT_OTHER): Payer: Self-pay | Admitting: Internal Medicine

## 2021-05-10 ENCOUNTER — Other Ambulatory Visit (HOSPITAL_BASED_OUTPATIENT_CLINIC_OR_DEPARTMENT_OTHER): Payer: Self-pay

## 2021-05-10 DIAGNOSIS — E875 Hyperkalemia: Secondary | ICD-10-CM

## 2021-05-10 DIAGNOSIS — N390 Urinary tract infection, site not specified: Secondary | ICD-10-CM | POA: Insufficient documentation

## 2021-05-10 DIAGNOSIS — R350 Frequency of micturition: Secondary | ICD-10-CM

## 2021-05-10 DIAGNOSIS — E876 Hypokalemia: Secondary | ICD-10-CM

## 2021-05-10 LAB — URINALYSIS, MACRO/MICRO
BILIRUBIN: NEGATIVE mg/dL
BLOOD: NEGATIVE mg/dL
COLOR: NORMAL
GLUCOSE: NORMAL mg/dL
KETONES: NEGATIVE mg/dL
LEUKOCYTES: 75 WBCs/uL — AB
NITRITE: NEGATIVE
PH: 6.5 (ref 5.0–8.0)
PROTEIN: NEGATIVE mg/dL
SPECIFIC GRAVITY: 1.006 (ref 1.005–1.030)
UROBILINOGEN: NORMAL mg/dL

## 2021-05-10 LAB — BASIC METABOLIC PANEL
ANION GAP: 8 mmol/L
BUN/CREA RATIO: 19
BUN: 12 mg/dL (ref 10–25)
CALCIUM: 9.4 mg/dL (ref 8.8–10.3)
CHLORIDE: 99 mmol/L (ref 98–111)
CO2 TOTAL: 29 mmol/L (ref 21–35)
CREATININE: 0.64 mg/dL (ref <=1.30)
ESTIMATED GFR: >60 mL/min/{1.73_m2}
GLUCOSE: 94 mg/dL (ref 70–110)
POTASSIUM: 4.7 mmol/L (ref 3.5–5.0)
SODIUM: 136 mmol/L (ref 135–145)

## 2021-05-10 NOTE — Progress Notes (Signed)
venipuncture performed in office-L Arm

## 2021-05-14 ENCOUNTER — Other Ambulatory Visit (HOSPITAL_BASED_OUTPATIENT_CLINIC_OR_DEPARTMENT_OTHER): Payer: Self-pay | Admitting: Internal Medicine

## 2021-05-14 NOTE — Telephone Encounter (Addendum)
[name] had a urine culture Friday and is asking for the results. She is asking for Macrobid.  Delfin Gant, MA  05/14/2021, 11:34

## 2021-05-15 ENCOUNTER — Other Ambulatory Visit (HOSPITAL_BASED_OUTPATIENT_CLINIC_OR_DEPARTMENT_OTHER): Payer: Self-pay | Admitting: Internal Medicine

## 2021-05-15 LAB — URINE CULTURE,ROUTINE: URINE CULTURE: 20000 — AB

## 2021-05-15 MED ORDER — NITROFURANTOIN MONOHYDRATE/MACROCRYSTALS 100 MG CAPSULE
100.0000 mg | ORAL_CAPSULE | Freq: Two times a day (BID) | ORAL | 0 refills | Status: DC
Start: 2021-05-15 — End: 2023-02-05

## 2021-05-15 MED ORDER — NITROFURANTOIN MONOHYDRATE/MACROCRYSTALS 100 MG CAPSULE
100.0000 mg | ORAL_CAPSULE | Freq: Two times a day (BID) | ORAL | 0 refills | Status: AC
Start: 2021-05-15 — End: 2021-05-22

## 2021-05-15 NOTE — Telephone Encounter (Signed)
Macrobid.  100 mg.  Dispense 14 tablets.  One p.o. b.i.d..  Notify us if symptoms do not improve

## 2021-05-15 NOTE — Addendum Note (Signed)
Addended by: Marcelyn Ditty DAWN on: 05/15/2021 11:41 AM     Modules accepted: Orders

## 2021-05-15 NOTE — Telephone Encounter (Signed)
[name] called and her urine culture shows she has an infection and she is requesting Macrobid.    Please advise    Ruta Hinds, [place]  05/15/2021, 11:36

## 2021-05-17 LAB — AEROBIC ORGANISM IDENTIFICATION/SUSCEPTIBILITIES

## 2021-09-04 ENCOUNTER — Ambulatory Visit: Payer: Medicare Other | Attending: Internal Medicine | Admitting: Internal Medicine

## 2021-09-04 ENCOUNTER — Other Ambulatory Visit (HOSPITAL_BASED_OUTPATIENT_CLINIC_OR_DEPARTMENT_OTHER): Payer: Self-pay | Admitting: Internal Medicine

## 2021-09-04 ENCOUNTER — Encounter (HOSPITAL_BASED_OUTPATIENT_CLINIC_OR_DEPARTMENT_OTHER): Payer: Self-pay | Admitting: Internal Medicine

## 2021-09-04 ENCOUNTER — Other Ambulatory Visit: Payer: Self-pay

## 2021-09-04 VITALS — BP 104/70 | HR 75 | Ht 62.0 in | Wt 76.0 lb

## 2021-09-04 DIAGNOSIS — E782 Mixed hyperlipidemia: Secondary | ICD-10-CM | POA: Insufficient documentation

## 2021-09-04 DIAGNOSIS — R634 Abnormal weight loss: Secondary | ICD-10-CM | POA: Insufficient documentation

## 2021-09-04 DIAGNOSIS — E039 Hypothyroidism, unspecified: Secondary | ICD-10-CM | POA: Insufficient documentation

## 2021-09-04 LAB — LIPID PANEL
CHOL/HDL RATIO: 3.2
CHOLESTEROL: 274 mg/dL — ABNORMAL HIGH (ref 100–200)
HDL CHOL: 86 mg/dL (ref >=50)
LDL CALC: 173 mg/dL — ABNORMAL HIGH (ref <100)
NON-HDL: 188 mg/dL (ref <=190)
TRIGLYCERIDES: 93 mg/dL (ref <150)
VLDL CALC: 18 mg/dL (ref <30)

## 2021-09-04 LAB — URINALYSIS, MACRO/MICRO
BILIRUBIN: NEGATIVE mg/dL
BLOOD: NEGATIVE mg/dL
COLOR: NORMAL
GLUCOSE: NORMAL mg/dL
KETONES: NEGATIVE mg/dL
LEUKOCYTES: NEGATIVE WBCs/uL
NITRITE: NEGATIVE
PH: 7 (ref 5.0–8.0)
PROTEIN: NEGATIVE mg/dL
SPECIFIC GRAVITY: 1.007 (ref 1.005–1.030)
UROBILINOGEN: NORMAL mg/dL

## 2021-09-04 LAB — CBC
HCT: 40.1 % (ref 34.8–46.0)
HGB: 13.4 g/dL (ref 11.5–16.0)
MCH: 30 pg (ref 26.0–32.0)
MCHC: 33.4 g/dL (ref 31.0–35.5)
MCV: 89.9 fL (ref 78.0–100.0)
MPV: 10 fL (ref 8.7–12.5)
PLATELETS: 228 10*3/uL (ref 150–400)
RBC: 4.46 10*6/uL (ref 3.85–5.22)
RDW-CV: 13.2 % (ref 11.5–15.5)
WBC: 5.5 10*3/uL (ref 3.7–11.0)

## 2021-09-04 LAB — COMPREHENSIVE METABOLIC PANEL, NON-FASTING
ALBUMIN: 4.1 g/dL (ref 3.4–4.8)
ALKALINE PHOSPHATASE: 50 U/L — ABNORMAL LOW (ref 55–145)
ALT (SGPT): 9 U/L (ref 8–22)
ANION GAP: 10 mmol/L (ref 4–13)
AST (SGOT): 15 U/L (ref 8–45)
BILIRUBIN TOTAL: 1 mg/dL (ref 0.3–1.3)
BUN/CREA RATIO: 16 (ref 6–22)
BUN: 13 mg/dL (ref 8–25)
CALCIUM: 9.3 mg/dL (ref 8.8–10.2)
CHLORIDE: 102 mmol/L (ref 96–111)
CO2 TOTAL: 26 mmol/L (ref 23–31)
CREATININE: 0.79 mg/dL (ref 0.60–1.05)
ESTIMATED GFR: 74 mL/min/BSA (ref >=60)
GLUCOSE: 105 mg/dL (ref 65–125)
POTASSIUM: 4 mmol/L (ref 3.5–5.1)
PROTEIN TOTAL: 6.8 g/dL (ref 6.0–8.0)
SODIUM: 138 mmol/L (ref 136–145)

## 2021-09-04 LAB — THYROID STIMULATING HORMONE (SENSITIVE TSH): TSH: 9.385 u[IU]/mL — ABNORMAL HIGH (ref 0.430–3.550)

## 2021-09-04 LAB — THYROXINE, FREE (FREE T4): THYROXINE (T4), FREE: 0.95 ng/dL (ref 0.70–1.25)

## 2021-09-04 MED ORDER — LEVOTHYROXINE 88 MCG TABLET
88.0000 ug | ORAL_TABLET | Freq: Every morning | ORAL | 4 refills | Status: DC
Start: 2021-09-04 — End: 2022-09-09

## 2021-09-04 MED ORDER — LEVOTHYROXINE 75 MCG TABLET
75.0000 ug | ORAL_TABLET | Freq: Every morning | ORAL | 3 refills | Status: DC
Start: 2021-09-04 — End: 2022-09-09

## 2021-09-04 NOTE — Addendum Note (Signed)
Addended by: Marletta Lor ANN on: 09/04/2021 02:39 PM     Modules accepted: Orders

## 2021-09-04 NOTE — H&P (Signed)
[facility] - Internal Medicine   [address] [place] [address] [address]  Dept: [phone]  Dept Fax: [phone]    [name]  12-17-1934  [id number]    Date of Service: 09/04/2021  9:00 AM EST    Chief complaint:   Chief Complaint   Patient presents with   . Follow Up 6 Months       HPI:     This is a case of a 87 y.o. year old female who comes in today for six-month follow-up.  No new surgeries.  No new medicines.  No new allergies to medicines.  Appetite been good.  Weight is up 1 lb.  Patient states reasonably active both physically and mentally.  Counseled on nutrition.    ROS:    CARDIAC:  No chest pain, DOE, or palpitations. No orthopnea or PND.    PULMONARY:  No cough, sputum, or hemoptysis. No fever,chills, or night sweats.    GI:  No nausea or vomiting. No abdominal pain. No change in bowel habits          No melena or bright red rectal bleeding.    GU :  Voiding without difficulty     NEURO: No headache , diplopia or loss of function of limbs.    Current Outpatient Medications   Medication Sig   . fluconazole (DIFLUCAN) 100 mg Oral Tablet Take 1 Tablet (100 mg total) by mouth Once a day   . Ibuprofen (MOTRIN) 800 mg Oral Tablet Take 1 Tablet (800 mg total) by mouth Three times a day as needed for Pain   . lactulose (ENULOSE) 10 gram/15 mL Oral Solution Take 30 mL by mouth Once a day   . levothyroxine (SYNTHROID) 75 mcg Oral Tablet Take 1 Tablet (75 mcg total) by mouth Every morning   . nitrofurantoin (MACROBID) 100 mg Oral Capsule Take 1 Capsule (100 mg total) by mouth Twice daily   . nystatin (NYSTOP) 100,000 unit/gram Powder Apply topically Twice daily   . potassium chloride (KLOR-CON) 10 mEq Oral Tablet Sustained Release Take 1 Tablet (10 mEq total) by mouth Once a day       Objective:     BP 104/70 (Site: Left)   Pulse 75   Ht 1.575 m (5\' 2" )   Wt (!) 34.5 kg (76 lb)   LMP  (LMP Unknown)   SpO2 94%   BMI 13.90 kg/m       General appearance: alert, oriented x 3,  in her normal state, cooperative, not in apparent distress, appearing stated age   Neck: No lymphadenopathy or thyroid anomalies noted, carotids 2 + bilaterally without bruit  Lungs: clear to auscultation bilaterally. No crackles or wheeze noted. Normal respiratory effort  Heart: No JVD. Regular rate and rhythm, S1, S2 normal.  No gallop or rub. No murmur  Abdomen: soft, non-tender. Bowel sounds normal. No hepatosplenomegaly.  Extremities: no cyanosis or edema.  Pulses intact.  No calf pain    Assessment/Plan     ENCOUNTER DIAGNOSES     ICD-10-CM   1. Acquired hypothyroidism .  Check free T4 and TSH adjust Synthroid if needed E03.9   2. Mixed hyperlipidemia .  Goal LDL less than 100 but keep in mind patient's overall nutritional needs E78.2   3. Weight loss patient up 1 lb since last office visit counseled on nutrition.  Received positive reinforcement. R63.4  Orders Placed This Encounter   . CBC   . COMPREHENSIVE METABOLIC PANEL, NON-FASTING   . LIPID PANEL   . URINALYSIS WITH REFLEX MICROSCOPIC AND CULTURE IF POSITIVE   . THYROXINE, FREE (FREE T4)   . THYROID STIMULATING HORMONE (SENSITIVE TSH)   . levothyroxine (SYNTHROID) 75 mcg Oral Tablet         The patient was given ample opportunity to ask questions and those questions were answered to the patient's satisfaction. The patient was encouraged to be involved in their own care, and all diagnoses, medications, and medication side-effects were discussed.  A copy of the patient's medication list was printed and given to the patient. A good faith effort was made to reconcile the patient's medications.  The patient was told to contact me with any additional questions or concerns, or go to the ED in an emergency.     Follow up: Return in about 6 months (around 03/04/2022).    This note was partially generated using [employer] Fluency Direct system, and there may be some incorrect words, spellings, and punctuation that were not noted in checking the note before  saving.    Harrie Foreman, M.D.    [facility] - Internal Medicine   [address] [place] [address] [address]  Dept: [phone]  Dept Fax: [phone]

## 2021-09-04 NOTE — Progress Notes (Signed)
Venipuncture in the L Arm patient tolerated well.   Tod Persia, MA  09/04/2021, 09:04

## 2021-09-04 NOTE — Telephone Encounter (Signed)
-----   Message from Filbert Berthold, MD sent at 09/04/2021  2:31 PM EST -----  Please call patient and informed that all labs are within acceptable limits.  Filbert Berthold, MD

## 2022-02-12 ENCOUNTER — Ambulatory Visit: Payer: Medicare Other | Admitting: Oncology

## 2022-02-13 LAB — COMPREHENSIVE METABOLIC PANEL
Albumin: 4.2 (ref 3.5–5.0)
Calcium: 9.5 (ref 8.7–10.7)

## 2022-02-13 LAB — HEPATIC FUNCTION PANEL
ALT: 20 U/L (ref 7–35)
AST: 30 (ref 13–35)
Alkaline Phosphatase: 55 (ref 25–125)
Bilirubin, Total: 0.6

## 2022-02-13 LAB — BASIC METABOLIC PANEL
BUN: 11 (ref 4–21)
CO2: 30 — AB (ref 13–22)
Chloride: 97 — AB (ref 99–108)
Creatinine: 0.8 (ref 0.5–1.1)
Glucose: 87
Potassium: 4.6 mEq/L (ref 3.5–5.1)
Sodium: 132 — AB (ref 137–147)

## 2022-02-13 LAB — CBC AND DIFFERENTIAL
HCT: 38 (ref 36–46)
Hemoglobin: 12.8 (ref 12.0–16.0)
Neutrophils Absolute: 3.67
Platelets: 229 10*3/uL (ref 150–400)
WBC: 6.8

## 2022-02-13 LAB — CBC: RBC: 4.08 (ref 3.87–5.11)

## 2022-02-14 ENCOUNTER — Encounter: Payer: Self-pay | Admitting: Oncology

## 2022-02-16 NOTE — Progress Notes (Signed)
[place]  [address] [place],  [place]  [address] [phone]  Clinic Day: 02/17/22  Referring physician: Algis Greenhouse, MD  CHIEF COMPLAINT:  CC: History of breast cancer  Current Treatment:  Surveillance   HISTORY OF PRESENT ILLNESS:  Linda Sanders is a 87 y.o. 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  She did receive chemotherapy and radiation but this was triple negative and so she is on follow-up but does have multiple other comorbidities including mycobacterium avium intracellulare, polymyositis, multiple pulmonary nodules, and irritable bowel syndrome.  She has repeat CT scans of the chest, abdomen and pelvis on a regular basis, yearly, to follow up on lesions of the lungs, pancreas and kidney.  She has multiple pulmonary nodules which have been present for years and are attributed to her polymyositis.  We have been following a unilocular cystic lesion in the uncinate process of the pancreas for years which has remained relatively stable.  She also has an enhancing lesion in the lower pole of the right kidney measuring 2.4 cm and relatively stable.  In addition to her triple negative breast cancer., she has 3 nieces with breast cancer and a first cousin with breast cancer at age 87.  Her daughter Linda Sanders has had a pancreatic neuroendocrine tumor, with  recurrence.  She also recently had a maternal first cousin with colon cancer who was tested for the colon cancer gene and was negative.  We have therefore recommended genetic testing per NCCN guidelines and that was done last year.  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  She comes in for yearly follow-up and repeat scans and mammogram.  She had surgical fusion for a herniated disc in May of 2017, and still complains of burning of  her lower extremities and intermittent edema.  Her hemoglobin dropped from 13 to 9 postop and she did have significant blood loss during surgery.  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  She also had pneumonia in June of 2017.  We reviewed her medications in detail and she is off the iron supplement, multivitamin and oxycodone, but is now on Lexapro 50 mg daily and vitamin-C 500 mg daily.  She had liver toxicity with Lipitor.  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 Her main complaints today are imbalance, weakness of her lower extremities, cough, dyspnea and occasional low-grade fevers.  She has had some nocturia and incontinence, and she has lost some weight. Her Rheumatologist, Dr. Dossie Der, orders her bone density scans and the one in 2018 did show osteoporosis.  She lost her daughter Linda Sanders a few years ago, she was also one of our patients, and died of pancreatic cancer.  Annual bilateral mammogram from July 2021 was clear.  CT chest, abdomen and pelvis from July 2021 was essentially stable with slight interval enlargement of a soft tissue attenuation of the pancreas, and partially exophytic lesion of the posterior midportion of the right kidney measuring 3.5 x 2.3 cm.    INTERVAL HISTORY:  Linda Sanders is here for annual follow up and states that she has been fairly well.She was hospitalized last month for a TIA, manifested by garbled speech and confusion, which resolved.  Annual screening mammography is scheduled for August 11th.  CT chest, abdomen and pelvis  from August 3rd revealed no definitive evidence of metastatic disease in the chest abdomen or pelvis.  Similar size of the partially exophytic soft tissue attenuating lesion in the posterior interpolar right kidney measuring 3.5 x 2.5 cm.  The pulmonary nodularity in the right lung is slightly improved, and the other lung changes are stable, consistent with chronic atypical infection. The 2.5 cm fluid attenuation  lesion in the pancreatic uncinate process now measures 3.0 cm, this lesion has very slightly enlarged over a long period of time dating back to 2009 and is most consistent with an IPMN. 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  Blood counts and chemistries are unremarkable except for a sodium of 132, improved.  Her  appetite is poor, but she has gained 2 pounds since her last visit.  She denies fever, chills or other signs of infection.  She denies nausea, vomiting, bowel issues, or abdominal pain.  She denies sore throat, cough, dyspnea, or chest pain.  REVIEW OF SYSTEMS:  Review of Systems  Constitutional: Negative.  Negative for appetite change, chills, fatigue, fever and unexpected weight change.  HENT:  Negative.    Eyes: Negative.   Respiratory: Negative.  Negative for chest tightness, cough, hemoptysis, shortness of breath and wheezing.   Cardiovascular: Negative.  Negative for chest pain, leg swelling and palpitations.  Gastrointestinal: Negative.  Negative for abdominal distention, abdominal pain, blood in stool, constipation, diarrhea, nausea and vomiting.  Endocrine: Negative.   Genitourinary: Negative.  Negative for difficulty urinating, dysuria, frequency and hematuria.   Musculoskeletal: Negative.  Negative for arthralgias, back pain, flank pain, gait problem and myalgias.  Skin: Negative.   Neurological: Negative.  Negative for dizziness, extremity weakness, gait problem, headaches, light-headedness, numbness, seizures and speech difficulty.  Hematological: Negative.   Psychiatric/Behavioral: Negative.  Negative for depression and sleep disturbance. The patient is not nervous/anxious.      VITALS:  Blood pressure 139/85, pulse 75, temperature 98.3 F (36.8 C), temperature source Oral, resp. rate 16, height _0  (1.575 m), weight 128 lb (58.1 kg), SpO2 98 %.  Wt Readings from Last 3 Encounters:  02/17/22 128 lb (58.1  kg)  02/01/21 126 lb 4.8 oz (57.3 kg)  07/27/18 120 lb (54.4 kg)    Body mass index is 23.41 kg/m.  Performance status (ECOG): 0 - Asymptomatic  PHYSICAL EXAM:  Physical Exam Constitutional:      General: She is not in acute distress.    Appearance: Normal appearance. She is normal weight.  HENT:     Head: Normocephalic and atraumatic.  Eyes:     General: No scleral icterus.    Extraocular Movements: Extraocular movements intact.     Conjunctiva/sclera: Conjunctivae normal.     Pupils: Pupils are equal, round, and reactive to light.  Neck:     Comments: Prominent bone in the right supraclavicular fossa, chronic. Cardiovascular:     Rate and Rhythm: Normal rate and regular rhythm.     Pulses: Normal pulses.     Heart sounds: Normal heart sounds. No murmur heard.    No friction rub. No gallop.  Pulmonary:     Effort: Pulmonary effort is normal. No respiratory distress.     Breath sounds: Rales (bilateral, inspiratory) present.  Chest:     Comments: Bilateral breast are without masses.  She has a couple of cysts in the lateral right breast, which are smooth, round and nontender.  She has a large scar  in the upper left breast which is well healed and extends to the axilla. Abdominal:     General: Bowel sounds are normal. There is no distension.     Palpations: Abdomen is soft. There is no hepatomegaly, splenomegaly or mass.     Tenderness: There is no abdominal tenderness.  Musculoskeletal:        General: Normal range of motion.     Cervical back: Normal range of motion and neck supple.     Right lower leg: No edema.     Left lower leg: No edema.  Lymphadenopathy:     Cervical: No cervical adenopathy.  Skin:    General: Skin is warm and dry.  Neurological:     General: No focal deficit present.     Mental Status: She is alert and oriented to person, place, and time. Mental status is at baseline.  Psychiatric:        Mood and Affect: Mood normal.        Behavior:  Behavior normal.        Thought Content: Thought content normal.        Judgment: Judgment normal.     LABS:      Latest Ref Rng & Units 12/29/2015    4:50 AM 12/28/2015    8:55 PM 06/14/2009    8:26 PM  CBC  WBC 4.0 - 10.5 K/uL 9.6  11.8  5.6   Hemoglobin 12.0 - 15.0 g/dL 10.3  11.3  13.1   Hematocrit 36.0 - 46.0 % 33.2  35.2  39.0   Platelets 150 - 400 K/uL 267  289  286       Latest Ref Rng & Units 12/29/2015    4:50 AM 12/28/2015    8:55 PM 06/14/2009    8:26 PM  CMP  Glucose 65 - 99 mg/dL 89  82  83   BUN 6 - 20 mg/dL _0 Creatinine 0.44 - 1.00 mg/dL 0.87  0.99  0.84   Sodium 135 - 145 mmol/L 131  129  141   Potassium 3.5 - 5.1 mmol/L 4.4  4.2  4.3   Chloride 101 - 111 mmol/L 99  98  103   CO2 22 - 32 mmol/L _1 Calcium 8.9 - 10.3 mg/dL 9.3  9.2  10.0   Total Protein 6.5 - 8.1 g/dL 5.9  6.5  6.8   Total Bilirubin 0.3 - 1.2 mg/dL 0.3  0.5  0.2   Alkaline Phos 38 - 126 U/L 68  70  46   AST 15 - 41 U/L _2 ALT 14 - 54 U/L _3 STUDIES:  See description above.  Allergies:  Allergies  Allergen Reactions   Amoxicillin-Pot Clavulanate Nausea Only    Other reaction(s): Other (See Comments) (GI)   Codeine     Vomiting  Other reaction(s): GI Upset (intolerance) (GI)   Avelox [Moxifloxacin Hcl In Nacl]     Upset stomach    Bupropion     Other reaction(s): Mental Status Changes (intolerance) aggitation   Chlorpheniramine     Other reaction(s): Other (See Comments) Other   Clarithromycin     Upset stomach    Cymbalta [Duloxetine Hcl]     Blurred vision    Metronidazole And Related     Rash, itching    Moxifloxacin     Upset stomach  Amoxicillin-Pot Clavulanate Nausea And Vomiting   Clarithromycin Diarrhea    Current Medications: Current Outpatient Medications  Medication Sig Dispense Refill   clopidogrel (PLAVIX) 75 MG tablet Take by mouth.     gabapentin (NEURONTIN) 400 MG capsule Take by mouth.     levothyroxine  (SYNTHROID) 50 MCG tablet Take by mouth.     acetaminophen (TYLENOL) 325 MG tablet Take 650 mg by mouth every 6 (six) hours as needed for mild pain.      ASPIRIN LOW DOSE 81 MG tablet Take 81 mg by mouth daily.     calcium carbonate (TUMS - DOSED IN MG ELEMENTAL CALCIUM) 500 MG chewable tablet Chew 1 tablet by mouth daily as needed for indigestion or heartburn.     carvedilol (COREG) 12.5 MG tablet Take 25 mg by mouth 2 (two) times daily.      ergocalciferol (VITAMIN D2) 1.25 MG (50000 UT) capsule TAKE 1 CAPSULE ONCE A WEEK     esomeprazole (NEXIUM) 40 MG capsule Take 40 mg by mouth daily.     Eszopiclone 3 MG TABS Take 3 mg by mouth at bedtime as needed.     LACTOBACILLUS RHAMNOSUS, GG, PO Take by mouth.     melatonin (MELATONIN MAXIMUM STRENGTH) 5 MG TABS Take by mouth.     Multiple Vitamin (MULTIVITAMIN) tablet Take 1 tablet by mouth daily.     Omega-3 Fatty Acids (OMEGA-3 FISH OIL PO) Take by mouth.     rosuvastatin (CRESTOR) 40 MG tablet Take 40 mg by mouth daily.     No current facility-administered medications for this visit.     ASSESSMENT & PLAN:   Assessment:   1. Stage IIA triple negative breast cancer now 16 years postop with no evidence of disease.  She was treated with surgery, radiation and chemotherapy.  2. Diagnosis of polymyositis.  3. Multiple pulmonary nodules felt to be secondary to her polymyositis, stable.  4. 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  5. Stable cystic lesion in the uncinate process of the pancreas, slightly larger over time.  6. 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  In view of its mild and slow enlargement, I see no urgency for cryoablation.  7. Stable lesion in the dome of the liver consistent with hemangioma by MRI scan.    8. Osteoporosis.  9. 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  10.  Hyponatremia,  improved.  I advised that she increase her sodium intake.    11.  Recent hospital admission for TIA in July 2023, recovered. She is now on aspirin and Plavix.  Plan: We reviewed the results of her CT imaging in detail and I answered her questions and gave her a copy.  Annual bilateral mammogram is scheduled for August 11th.  I will see her back again in 1 year with CBC, comprehensive metabolic profile, bilateral mammogram and CT scans of chest, abdomen and pelvis for continued surveillance.  She understands and agrees with this plan of care.     I provided 30 minutes of face-to-face time during this this encounter and > 50% was spent counseling as documented under my assessment and plan.    Derwood Kaplan, MD [facility] AT [facility] [address] [place] [address] Dept: [phone] Dept Fax: [phone]

## 2022-02-17 ENCOUNTER — Encounter: Payer: Self-pay | Admitting: Oncology

## 2022-02-17 ENCOUNTER — Other Ambulatory Visit: Payer: Self-pay | Admitting: Oncology

## 2022-02-17 ENCOUNTER — Inpatient Hospital Stay: Payer: Medicare Other | Attending: Oncology | Admitting: Oncology

## 2022-02-17 VITALS — BP 139/85 | HR 75 | Temp 98.3°F | Resp 16 | Ht 62.0 in | Wt 128.0 lb

## 2022-02-17 DIAGNOSIS — C641 Malignant neoplasm of right kidney, except renal pelvis: Secondary | ICD-10-CM

## 2022-02-17 DIAGNOSIS — Z853 Personal history of malignant neoplasm of breast: Secondary | ICD-10-CM

## 2022-02-17 DIAGNOSIS — R918 Other nonspecific abnormal finding of lung field: Secondary | ICD-10-CM

## 2022-02-17 DIAGNOSIS — N2889 Other specified disorders of kidney and ureter: Secondary | ICD-10-CM

## 2022-02-28 ENCOUNTER — Encounter: Payer: Self-pay | Admitting: Oncology

## 2022-03-05 ENCOUNTER — Ambulatory Visit: Payer: Medicare Other | Attending: Internal Medicine | Admitting: Internal Medicine

## 2022-03-05 ENCOUNTER — Other Ambulatory Visit (HOSPITAL_BASED_OUTPATIENT_CLINIC_OR_DEPARTMENT_OTHER): Payer: Self-pay | Admitting: Internal Medicine

## 2022-03-05 ENCOUNTER — Other Ambulatory Visit (HOSPITAL_BASED_OUTPATIENT_CLINIC_OR_DEPARTMENT_OTHER): Payer: Self-pay

## 2022-03-05 ENCOUNTER — Other Ambulatory Visit (HOSPITAL_COMMUNITY): Payer: Medicare Other | Admitting: Internal Medicine

## 2022-03-05 ENCOUNTER — Telehealth (HOSPITAL_BASED_OUTPATIENT_CLINIC_OR_DEPARTMENT_OTHER): Payer: Self-pay | Admitting: Internal Medicine

## 2022-03-05 ENCOUNTER — Encounter (HOSPITAL_BASED_OUTPATIENT_CLINIC_OR_DEPARTMENT_OTHER): Payer: Self-pay | Admitting: Internal Medicine

## 2022-03-05 ENCOUNTER — Other Ambulatory Visit: Payer: Self-pay

## 2022-03-05 VITALS — BP 122/72 | HR 93 | Ht 62.0 in | Wt <= 1120 oz

## 2022-03-05 DIAGNOSIS — E039 Hypothyroidism, unspecified: Secondary | ICD-10-CM

## 2022-03-05 DIAGNOSIS — R8290 Unspecified abnormal findings in urine: Secondary | ICD-10-CM | POA: Insufficient documentation

## 2022-03-05 DIAGNOSIS — E782 Mixed hyperlipidemia: Secondary | ICD-10-CM

## 2022-03-05 DIAGNOSIS — Z0000 Encounter for general adult medical examination without abnormal findings: Secondary | ICD-10-CM | POA: Insufficient documentation

## 2022-03-05 LAB — COMPREHENSIVE METABOLIC PANEL, NON-FASTING
ALBUMIN: 4.1 g/dL (ref 3.4–4.8)
ALKALINE PHOSPHATASE: 54 U/L — ABNORMAL LOW (ref 55–145)
ALT (SGPT): 7 U/L — ABNORMAL LOW (ref 8–22)
ANION GAP: 7 mmol/L (ref 4–13)
AST (SGOT): 18 U/L (ref 8–45)
BILIRUBIN TOTAL: 1.6 mg/dL — ABNORMAL HIGH (ref 0.3–1.3)
BUN/CREA RATIO: 14 (ref 6–22)
BUN: 11 mg/dL (ref 8–25)
CALCIUM: 9.1 mg/dL (ref 8.8–10.2)
CHLORIDE: 98 mmol/L (ref 96–111)
CO2 TOTAL: 29 mmol/L (ref 23–31)
CREATININE: 0.76 mg/dL (ref 0.60–1.05)
ESTIMATED GFR: 77 mL/min/BSA (ref >=60)
GLUCOSE: 91 mg/dL (ref 65–125)
POTASSIUM: 3.9 mmol/L (ref 3.5–5.1)
PROTEIN TOTAL: 7 g/dL (ref 6.0–8.0)
SODIUM: 134 mmol/L — ABNORMAL LOW (ref 136–145)

## 2022-03-05 LAB — CBC
HCT: 41.3 % (ref 34.8–46.0)
HGB: 13.7 g/dL (ref 11.5–16.0)
MCH: 29.9 pg (ref 26.0–32.0)
MCHC: 33.2 g/dL (ref 31.0–35.5)
MCV: 90.2 fL (ref 78.0–100.0)
MPV: 9.8 fL (ref 8.7–12.5)
PLATELETS: 215 10*3/uL (ref 150–400)
RBC: 4.58 10*6/uL (ref 3.85–5.22)
RDW-CV: 12.6 % (ref 11.5–15.5)
WBC: 5.2 10*3/uL (ref 3.7–11.0)

## 2022-03-05 LAB — THYROXINE, FREE (FREE T4): THYROXINE (T4), FREE: 2.21 ng/dL — ABNORMAL HIGH (ref 0.70–1.25)

## 2022-03-05 LAB — URINALYSIS, MACRO/MICRO
BILIRUBIN: NEGATIVE mg/dL
BLOOD: NEGATIVE mg/dL
COLOR: NORMAL
GLUCOSE: NORMAL mg/dL
KETONES: NEGATIVE mg/dL
LEUKOCYTES: 500 WBCs/uL — AB
NITRITE: NEGATIVE
PH: 6.5 (ref 5.0–8.0)
PROTEIN: NEGATIVE mg/dL
SPECIFIC GRAVITY: 1.013 (ref 1.005–1.030)
UROBILINOGEN: NORMAL mg/dL

## 2022-03-05 LAB — LIPID PANEL
CHOL/HDL RATIO: 2.8
CHOLESTEROL: 212 mg/dL — ABNORMAL HIGH (ref 100–200)
HDL CHOL: 77 mg/dL (ref >=50)
LDL CALC: 121 mg/dL — ABNORMAL HIGH (ref <100)
NON-HDL: 135 mg/dL (ref <=190)
TRIGLYCERIDES: 78 mg/dL (ref <150)
VLDL CALC: 13 mg/dL (ref <30)

## 2022-03-05 LAB — THYROID STIMULATING HORMONE (SENSITIVE TSH): TSH: 0.093 u[IU]/mL — ABNORMAL LOW (ref 0.430–3.550)

## 2022-03-05 MED ORDER — LEVOTHYROXINE 50 MCG TABLET
50.0000 ug | ORAL_TABLET | Freq: Every morning | ORAL | 0 refills | Status: DC
Start: 2022-03-05 — End: 2022-06-25

## 2022-03-05 NOTE — Telephone Encounter (Signed)
TSH 

## 2022-03-05 NOTE — Telephone Encounter (Signed)
Hold Synthroid for 1 week.  Start Synthroid at 50 mcg p.o. daily.  Recheck free T4 and TSH in 4 weeks.  Update in EMR

## 2022-03-05 NOTE — Progress Notes (Signed)
venipuncture performed in office-L Arm  Haley Steeves, MA

## 2022-03-05 NOTE — Nursing Note (Signed)
03/05/22 1100   Comprehensive Health Assessment-Adult   During the past 4 weeks, how would you rate your health in general? Good   During the past 4 weeks, how much difficulty have you had doing your usual activities inside and outside your home because of medical or emotional problems? No difficulty at all   During the past 4 weeks, was someone available to help you if you needed and wanted help? Yes, as much as I wanted   In the past year, how many times have you gone to the emergency department or been admitted to a hospital for a health problem? None   Are you generally satisfied with your sleep? Yes   Do you have enough money to buy things you need in everyday life, such as food, clothing, medicines, and housing? Yes, always   Can you get to places beyond walking distance without help?  (For example, can you drive your own car or travel alone on buses)? Yes   Do you fasten your seatbelt when you are in a car? Yes, usually   Do you exercise 20 minutes 3 or more days per week (such as walking, dancing, biking, mowing grass, swimming)? Yes, some of the time   How often do you eat food that is healthy (fruits, vegetables, lean meats) instead of unhealthy (sweets, fast food, junk food, fatty foods)? Most of the time   Have your parents, brothers or sisters had any of the following problems before the age of 63? (check all that apply) Heart problems, or hardening of the arteries;Diabetes (sugar);Cancer   How often do you have trouble taking medicines the eay you are told to take them? I always take them as prescribed   Do you have one person you think of as your personal doctor (primary care provider or family doctor)? Yes   If you are seeing a Primary Care Provider (PCP) or family doctor. please list their name Dr. Particia Lather   Are you now also seeing any specialist physician(s) (such as eye doctor, foot doctor, skin doctor)? No   How confident are you that you can control or manage most of your health  problems? Somewhat confident

## 2022-03-05 NOTE — H&P (Signed)
INTERNAL MEDICINE, PHYSICIAN OFFICE BUILDING  [address]  [name] [address]  Operated by [facility]  Medicare Annual Wellness Visit    Name: [name] MRN:  [id number]   Date: 03/05/2022 Age: 87 y.o.       SUBJECTIVE:   [name] is a 87 y.o. female presenting for a Medicare Wellness exam.  No new surgeries.  No new medicines.  No new allergies to medicines.  Appetite been fair.  Weight is down a couple more lb.  We discussed that today.  Discussed eating when o'clock says it is time to eat not when she is hungry.  Also discussed snacking.  Also discussed not being concerned about type of food just eat as much of the food she craves that she can.  I have reviewed and reconciled the medication list with the patient today.    Comprehensive Health Assessment:  Patient verbal responses recorded in flowsheet        No data to display                I have reviewed and updated as appropriate the past medical, family and social history. 03/05/2022 as summarized below:  Past Medical History:   Diagnosis Date    Fibromyalgia     Hypercholesterolemia      History reviewed. No pertinent surgical history.  Current Outpatient Medications   Medication Sig    fluconazole (DIFLUCAN) 100 mg Oral Tablet Take 1 Tablet (100 mg total) by mouth Once a day    Ibuprofen (MOTRIN) 800 mg Oral Tablet Take 1 Tablet (800 mg total) by mouth Three times a day as needed for Pain    lactulose (ENULOSE) 10 gram/15 mL Oral Solution Take 30 mL by mouth Once a day    levothyroxine (SYNTHROID) 75 mcg Oral Tablet Take 1 Tablet (75 mcg total) by mouth Every morning (Patient taking differently: Take 88 mcg by mouth Every morning)    levothyroxine (SYNTHROID) 88 mcg Oral Tablet Take 1 Tablet (88 mcg total) by mouth Every morning    nitrofurantoin (MACROBID) 100 mg Oral Capsule Take 1 Capsule (100 mg total) by mouth Twice daily    nystatin (NYSTOP) 100,000 unit/gram Powder Apply topically Twice daily    potassium  chloride (KLOR-CON) 10 mEq Oral Tablet Sustained Release Take 1 Tablet (10 mEq total) by mouth Once a day     Family Medical History:       Problem Relation (Age of Onset)    Hypertension (High Blood Pressure) Mother, Father            Social History     Socioeconomic History    Marital status: Widowed   Tobacco Use    Smoking status: Never    Smokeless tobacco: Never   Substance and Sexual Activity    Drug use: Never    Sexual activity: Not Currently         List of Current Health Care Providers   Care Team       PCP       Name Type Specialty Phone Number    Marylee Floras, MD Physician INTERNAL MEDICINE [phone]              Care Team       No care team found                      Health Maintenance   Topic Date Due  Osteoporosis screening  Never done    Adult Tdap-Td (1 - Tdap) Never done    Shingles Vaccine (1 of 2) Never done    Pneumococcal Vaccination, Age 26+ (1 - PCV) Never done    Covid-19 Vaccine (4 - Pfizer series) 08/27/2020    Influenza Vaccine (1) 03/14/2022    Depression Screening  03/06/2023    Annual Wellness Visit  03/06/2023    Meningococcal Vaccine  Aged Out     Medicare Wellness Assessment   Medicare initial or wellness physical in the last year?: Yes  Advance Directives   Does patient have a living will or MPOA: no   Has patient provided [employer] with a copy?: no   Advance directive information given to the patient today?: no      Activities of Daily Living   Do you need help with dressing, bathing, or walking?: No   Do you need help with shopping, housekeeping, medications, or finances?: No   Do you have rugs in hallways, broken steps, or poor lighting?: No   Do you have grab bars in your bathroom, non-slip strips in your tub, and hand rails on your stairs?: Yes   Urinary Incontinence Screen   Do you ever leak urine when you don't want to?: YES   Cognitive Function Screen (1=Yes, 0=No)   What is you age?: Correct   What is the time to the nearest hour?: Correct   What is the  year?: Correct   What is the name of this clinic?: Correct   Can the patient recognize two persons (the doctor, the nurse, home help, etc.)?: Correct   What is the date of your birth? (day and month sufficient) : Correct   In what year did World War II end?: Correct   Who is the current president of the [place]?: Correct   Count from 20 down to 1?: Correct   What address did I give you earlier?: Incorrect   Total Score: 9   Interpretation of Total Score: Greater than 6 Normal   Hearing Screen   Have you noticed any hearing difficulties?: Yes  After whispering 9-1-6 how many numbers did the patient repeat correctly?: 1  After whispering 4-7-8 how many numbers did the patient repeat correctly?: 1   Fall Risk Screen   Do you feel unsteady when standing or walking?: Yes  Do you worry about falling?: Yes  Have you fallen in the past year?: Yes  How many times have you fallen?: Once  Were you ever injured from falling?: Yes   Vision Screen   Right Eye = 20: 70   Left Eye = 20: 70   Depression Screen     Little interest or pleasure in doing things.: Several Days  Feeling down, depressed, or hopeless: Several Days  PHQ 2 Total: 2     Pain Score   Pain Score:   0 - No pain    Substance Use-Abuse Screening     Tobacco Use     In Past 12 MONTHS, how often have you used any tobacco product (for example, cigarettes, e-cigarettes, cigars, pipes, or smokeless tobacco)?: Never     Alcohol use     In the PAST 12 MONTHS, how often have you had 5 (men)/4 (women) or more drinks containing alcohol in one day?: Never     Prescription Drug Use     In the PAST 12 months, how often have you used any prescription medications just for the feeling,  more than prescribed, or that were not prescribed for you? Prescriptions may include: opioids, benzodiazepines, medications for ADHD: Never           Illicit Drug Use   In the PAST 12 MONTHS, how often have you used any drugs, including marijuana, cocaine or crack, heroin, methamphetamine,  hallucinogens, ecstasy/MDMA?: Never             REVIEW OF SYSTEMS:   CARDIAC:  No chest pain, DOE, or palpitations. No orthopnea or PND.     PULMONARY:  No cough, sputum, or hemoptysis. No fever,chills, or night sweats.    GI:  No nausea or vomiting. No abdominal pain. No change in bowel habits          No melena or bright red rectal bleeding.    GU :  Voiding without difficulty     NEURO: No headache , diplopia or loss of function of limbs.    OBJECTIVE:   Physical Exam:   Vitals:    03/05/22 0845   BP: 122/72   Pulse: 93   SpO2: 97%   Weight: (!) 31.8 kg (70 lb)   Height: 1.575 m (5\' 2" )   BMI: 12.83         General: No acute/apparent distress, alert and oriented x3, in her normal state, cooperative, appearing stated age.  Neck: Neck supple without lymphadenopathy. No thyroid anomalies noted. Carotids 2 + bilaterally without bruit.  Cardio: No JVD. Regular rate and rhythm. Normal S1 and S2. No murmurs, gallops, or rubs.   Resp: Clear to auscultation bilaterally. No wheezes, rales, rhonchi or crackles. Normal resp effort.  Abd: Soft, non-tender. Bowel sounds present in all 4 quadrants. No hepatosplenomegaly.  Extremities: No cyanosis or edema noted. Pulses intact.  No calf pain.    Health Maintenance Due   Topic Date Due    Osteoporosis screening  Never done    Adult Tdap-Td (1 - Tdap) Never done    Shingles Vaccine (1 of 2) Never done    Pneumococcal Vaccination, Age 43+ (1 - PCV) Never done    Covid-19 Vaccine (4 - Pfizer series) 08/27/2020      ASSESSMENT & PLAN:            Assessment/Plan   1. Medicare annual wellness visit, subsequent .  Completed.  Counseled on nutrition   2. Mixed hyperlipidemia .  Goal LDL less than 100 but keep in mind patient's nutritional needs and advanced age   105. Acquired hypothyroidism .  Check lab      Identified Risk Factors/ Recommended Actions     Fall Risk Follow up plan of care: Discussed optimizing home safety  The PHQ 2 Total: 2 depression screen is interpreted as  negative.  Urinary Incontinence Plan of Care:  Check urinalysis with reflex culture         Orders Placed This Encounter    CBC    COMPREHENSIVE METABOLIC PANEL, NON-FASTING    LIPID PANEL    THYROXINE, FREE (FREE T4)    THYROID STIMULATING HORMONE (SENSITIVE TSH)    URINALYSIS WITH REFLEX MICROSCOPIC AND CULTURE IF POSITIVE          The patient has been educated about risk factors and recommended preventive care. Written Prevention Plan completed/ updated and given to patient (see After Visit Summary).    Return in about 6 months (around 09/05/2022).    Filbert Berthold, MD

## 2022-03-05 NOTE — Telephone Encounter (Signed)
DR Angotti,   Patient is currently taking 88 mcg of synthroid. Tod Persia, MA

## 2022-03-05 NOTE — Telephone Encounter (Signed)
-----   Message from Marylee Floras, MD sent at 03/05/2022 12:46 PM EDT -----  Please call patient and informed that all labs are within acceptable limits.  Free T4 however is way too high.  This is why patient's losing weight.  Please verify patient's present dose of thyroid medication and let me know.  Also urinalysis with a few white cells.  Await culture results  Marylee Floras, MD

## 2022-03-06 LAB — URINE CULTURE,ROUTINE: URINE CULTURE: NO GROWTH

## 2022-04-08 ENCOUNTER — Ambulatory Visit: Payer: Medicare Other | Attending: Internal Medicine

## 2022-04-08 ENCOUNTER — Other Ambulatory Visit: Payer: Self-pay

## 2022-04-08 DIAGNOSIS — E039 Hypothyroidism, unspecified: Secondary | ICD-10-CM | POA: Insufficient documentation

## 2022-04-08 DIAGNOSIS — N390 Urinary tract infection, site not specified: Secondary | ICD-10-CM | POA: Insufficient documentation

## 2022-04-08 LAB — URINALYSIS, MACRO/MICRO
BILIRUBIN: NEGATIVE mg/dL
BLOOD: NEGATIVE mg/dL
COLOR: NORMAL
GLUCOSE: NORMAL mg/dL
KETONES: 10 mg/dL — AB
LEUKOCYTES: 250 WBCs/uL — AB
NITRITE: NEGATIVE
PH: 7 (ref 5.0–8.0)
PROTEIN: NEGATIVE mg/dL
SPECIFIC GRAVITY: 1.016 (ref 1.005–1.030)
UROBILINOGEN: NORMAL mg/dL

## 2022-04-08 LAB — THYROXINE, FREE (FREE T4): THYROXINE (T4), FREE: 1.18 ng/dL (ref 0.70–1.48)

## 2022-04-08 LAB — THYROID STIMULATING HORMONE (SENSITIVE TSH): TSH: 5.541 u[IU]/mL — ABNORMAL HIGH (ref 0.350–4.940)

## 2022-04-08 NOTE — Progress Notes (Signed)
venipuncture performed in office-L Arm  Tylar Merendino, MA

## 2022-04-10 LAB — URINE CULTURE,ROUTINE: URINE CULTURE: NO GROWTH

## 2022-06-25 ENCOUNTER — Other Ambulatory Visit (HOSPITAL_BASED_OUTPATIENT_CLINIC_OR_DEPARTMENT_OTHER): Payer: Self-pay | Admitting: Internal Medicine

## 2022-06-26 ENCOUNTER — Other Ambulatory Visit (HOSPITAL_BASED_OUTPATIENT_CLINIC_OR_DEPARTMENT_OTHER): Payer: Self-pay | Admitting: Internal Medicine

## 2022-06-26 MED ORDER — LEVOTHYROXINE 50 MCG TABLET
50.0000 ug | ORAL_TABLET | Freq: Every morning | ORAL | 3 refills | Status: DC
Start: 2022-06-26 — End: 2022-09-09

## 2022-09-08 ENCOUNTER — Other Ambulatory Visit: Payer: Self-pay

## 2022-09-08 ENCOUNTER — Ambulatory Visit: Payer: Medicare Other | Attending: Internal Medicine | Admitting: Internal Medicine

## 2022-09-08 ENCOUNTER — Encounter (HOSPITAL_BASED_OUTPATIENT_CLINIC_OR_DEPARTMENT_OTHER): Payer: Self-pay | Admitting: Internal Medicine

## 2022-09-08 VITALS — BP 120/80 | HR 87 | Ht 62.0 in | Wt 74.0 lb

## 2022-09-08 DIAGNOSIS — E039 Hypothyroidism, unspecified: Secondary | ICD-10-CM | POA: Insufficient documentation

## 2022-09-08 DIAGNOSIS — E782 Mixed hyperlipidemia: Secondary | ICD-10-CM | POA: Insufficient documentation

## 2022-09-08 DIAGNOSIS — E46 Unspecified protein-calorie malnutrition: Secondary | ICD-10-CM | POA: Insufficient documentation

## 2022-09-08 LAB — LIPID PANEL
CHOL/HDL RATIO: 3.2
CHOLESTEROL: 287 mg/dL — ABNORMAL HIGH (ref 100–200)
HDL CHOL: 90 mg/dL (ref >=50)
LDL CALC: 186 mg/dL — ABNORMAL HIGH (ref <100)
NON-HDL: 197 mg/dL — ABNORMAL HIGH (ref <=190)
TRIGLYCERIDES: 72 mg/dL (ref <150)
VLDL CALC: 14 mg/dL (ref <30)

## 2022-09-08 LAB — URINALYSIS, MACRO/MICRO
BILIRUBIN: NEGATIVE mg/dL
BLOOD: NEGATIVE mg/dL
COLOR: NORMAL
GLUCOSE: NORMAL mg/dL
KETONES: NEGATIVE mg/dL
LEUKOCYTES: NEGATIVE WBCs/uL
NITRITE: NEGATIVE
PH: 7 (ref 5.0–8.0)
PROTEIN: NEGATIVE mg/dL
SPECIFIC GRAVITY: 1.009 (ref 1.005–1.030)
UROBILINOGEN: NORMAL mg/dL

## 2022-09-08 LAB — THYROXINE, FREE (FREE T4): THYROXINE (T4), FREE: 1.09 ng/dL (ref 0.70–1.48)

## 2022-09-08 LAB — COMPREHENSIVE METABOLIC PANEL, NON-FASTING
ALBUMIN: 4.2 g/dL (ref 3.4–4.8)
ALKALINE PHOSPHATASE: 49 U/L — ABNORMAL LOW (ref 55–145)
ALT (SGPT): <5 U/L — ABNORMAL LOW (ref 8–22)
ANION GAP: 10 mmol/L (ref 4–13)
AST (SGOT): 19 U/L (ref 8–45)
BILIRUBIN TOTAL: 1.2 mg/dL (ref 0.3–1.3)
BUN/CREA RATIO: 12 (ref 6–22)
BUN: 9 mg/dL (ref 8–25)
CALCIUM: 9.3 mg/dL (ref 8.6–10.3)
CHLORIDE: 100 mmol/L (ref 96–111)
CO2 TOTAL: 27 mmol/L (ref 23–31)
CREATININE: 0.78 mg/dL (ref 0.60–1.05)
ESTIMATED GFR - FEMALE: 74 mL/min/BSA (ref >=60)
GLUCOSE: 87 mg/dL (ref 65–125)
POTASSIUM: 4 mmol/L (ref 3.5–5.1)
PROTEIN TOTAL: 7.1 g/dL (ref 6.0–8.0)
SODIUM: 137 mmol/L (ref 136–145)

## 2022-09-08 LAB — CBC
HCT: 43.2 % (ref 34.8–46.0)
HGB: 13.7 g/dL (ref 11.5–16.0)
MCH: 29.7 pg (ref 26.0–32.0)
MCHC: 31.7 g/dL (ref 31.0–35.5)
MCV: 93.5 fL (ref 78.0–100.0)
MPV: 9.7 fL (ref 8.7–12.5)
PLATELETS: 236 10*3/uL (ref 150–400)
RBC: 4.62 10*6/uL (ref 3.85–5.22)
RDW-CV: 13.2 % (ref 11.5–15.5)
WBC: 6.3 10*3/uL (ref 3.7–11.0)

## 2022-09-08 LAB — THYROID STIMULATING HORMONE (SENSITIVE TSH): TSH: 15.379 u[IU]/mL — ABNORMAL HIGH (ref 0.350–4.940)

## 2022-09-08 NOTE — H&P (Signed)
[facility] - Internal Medicine   [address] [place]een [place] [address]  Dept: [phone]  Dept Fax: [place]  [birth year]  [id number]    Date of Service: 09/08/2022  8:45 AM EST    Chief complaint:   Chief Complaint   Patient presents with    Follow Up 6 Months       HPI:     This is a case of a 89 y.o. year old female who comes in today for six-month follow-up.  New surgeries.  No medicines.  No new allergies to.  Appetite been good patient gained 4 lb.  Positive reinforcement.  Tries remain physically mentally active.  Overall feels well    ROS:    CARDIAC:  No chest pain, DOE, or palpitations. No orthopnea or PND.    PULMONARY:  No cough, sputum, or hemoptysis. No fever,chills, or night sweats.    GI:  No nausea or vomiting. No abdominal pain. No change in bowel habits          No melena or bright red rectal bleeding.    GU :  Voiding without difficulty     NEURO: No headache , diplopia or loss of function of limbs.    Current Outpatient Medications   Medication Sig    fluconazole (DIFLUCAN) 100 mg Oral Tablet Take 1 Tablet (100 mg total) by mouth Once a day    Ibuprofen (MOTRIN) 800 mg Oral Tablet Take 1 Tablet (800 mg total) by mouth Three times a day as needed for Pain    lactulose (ENULOSE) 10 gram/15 mL Oral Solution Take 30 mL by mouth Once a day    levothyroxine (SYNTHROID) 50 mcg Oral Tablet Take 1 Tablet (50 mcg total) by mouth Every morning    levothyroxine (SYNTHROID) 75 mcg Oral Tablet Take 1 Tablet (75 mcg total) by mouth Every morning (Patient not taking: Reported on 09/08/2022)    levothyroxine (SYNTHROID) 88 mcg Oral Tablet Take 1 Tablet (88 mcg total) by mouth Every morning (Patient taking differently: Take 50 mcg by mouth Every morning)    nitrofurantoin (MACROBID) 100 mg Oral Capsule Take 1 Capsule (100 mg total) by mouth Twice daily    nystatin (NYSTOP) 100,000 unit/gram Powder Apply topically Twice daily    potassium chloride (KLOR-CON) 10 mEq  Oral Tablet Sustained Release Take 1 Tablet (10 mEq total) by mouth Once a day       Objective:     BP 120/80   Pulse 87   Ht 1.575 m ('5\' 2"'$ )   Wt (!) 33.6 kg (74 lb)   LMP  (LMP Unknown)   SpO2 97%   BMI 13.53 kg/m       General appearance: alert, oriented x 3, in her normal state, cooperative, not in apparent distress, appearing stated age   Neck: No lymphadenopathy or thyroid anomalies noted, carotids 2 + bilaterally without bruit  Lungs: clear to auscultation bilaterally. No crackles or wheeze noted. Normal respiratory effort  Heart: No JVD. Regular rate and rhythm, S1, S2 normal.  No gallop or rub. No murmur  Abdomen: soft, non-tender. Bowel sounds normal. No hepatosplenomegaly.  Extremities: no cyanosis or edema.  Pulses intact.  No calf pain    Assessment/Plan     ENCOUNTER DIAGNOSES     ICD-10-CM   1. Acquired hypothyroidism .  No signs or symptoms of higher low thyroid however with weight concerns check free T4 and TSH adjust  medicines if needed E03.9   2. Mixed hyperlipidemia .  Check lipid panel E78.2   3. Protein-calorie malnutrition, unspecified severity (CMS [place]) .  Push nutrition E46                     Orders Placed This Encounter    CBC    COMPREHENSIVE METABOLIC PANEL, NON-FASTING    LIPID PANEL    THYROXINE, FREE (FREE T4)    THYROID STIMULATING HORMONE (SENSITIVE TSH)    URINALYSIS WITH REFLEX MICROSCOPIC AND CULTURE IF POSITIVE         The patient was given ample opportunity to ask questions and those questions were answered to the patient's satisfaction. The patient was encouraged to be involved in their own care, and all diagnoses, medications, and medication side-effects were discussed.  A copy of the patient's medication list was printed and given to the patient. A good faith effort was made to reconcile the patient's medications.  The patient was told to contact me with any additional questions or concerns, or go to the ED in an emergency.     Follow up: Return in about 6 months  (around 03/09/2023).    This note was partially generated using MModal Fluency Direct system, and there may be some incorrect words, spellings, and punctuation that were not noted in checking the note before saving.    [name], M.D.    [facility] - Internal Medicine   [address] [place] [address] [address]  Dept: [phone]  Dept Fax: [phone]

## 2022-09-08 NOTE — Progress Notes (Signed)
Venipuncture in the L Arm, patient tolerated well.  Amrit Erck, MA

## 2022-09-09 ENCOUNTER — Other Ambulatory Visit (HOSPITAL_BASED_OUTPATIENT_CLINIC_OR_DEPARTMENT_OTHER): Payer: Self-pay | Admitting: Internal Medicine

## 2022-09-09 ENCOUNTER — Telehealth (HOSPITAL_BASED_OUTPATIENT_CLINIC_OR_DEPARTMENT_OTHER): Payer: Self-pay | Admitting: Internal Medicine

## 2022-09-09 MED ORDER — LEVOTHYROXINE 75 MCG TABLET
75.0000 ug | ORAL_TABLET | Freq: Every morning | ORAL | 3 refills | Status: AC
Start: 2022-09-09 — End: ?

## 2022-09-09 NOTE — Telephone Encounter (Signed)
-----   Message from Filbert Berthold, MD sent at 09/08/2022  3:33 PM EST -----  Please call patient and informed that TSH level was elevated thyroid pill needs to be increased.  Please verify patient's present dose of Synthroid.  Update in EMR.  Let me know.  Filbert Berthold, MD

## 2022-09-09 NOTE — Telephone Encounter (Signed)
Patient notified of the increase in the synthroid and script sent to dr to sign off on.  Marletta Lor, MA

## 2022-09-09 NOTE — Telephone Encounter (Signed)
Increase Synthroid to 75 mcg daily.  Please update in EMR.

## 2022-09-09 NOTE — Telephone Encounter (Signed)
Dr Jenetta Downer,    Patient is taking 50 mcg of synthroid.  Please advise Lattie Haw

## 2023-01-23 ENCOUNTER — Telehealth: Payer: Self-pay | Admitting: Oncology

## 2023-01-23 NOTE — Telephone Encounter (Signed)
CT C/A/P has been scheduled for 02/16/23 @ 1; Checking in @ 12   Notified pt of date,time and instructions.

## 2023-01-27 ENCOUNTER — Other Ambulatory Visit: Payer: Self-pay | Admitting: Oncology

## 2023-01-27 DIAGNOSIS — Z853 Personal history of malignant neoplasm of breast: Secondary | ICD-10-CM

## 2023-01-29 ENCOUNTER — Emergency Department (HOSPITAL_COMMUNITY): Payer: Medicare Other

## 2023-01-29 ENCOUNTER — Emergency Department
Admission: EM | Admit: 2023-01-29 | Discharge: 2023-01-30 | Disposition: A | Discharge status: Home / Self Care | Payer: Medicare Other | Attending: Emergency Medicine | Admitting: Emergency Medicine

## 2023-01-29 ENCOUNTER — Other Ambulatory Visit: Payer: Self-pay

## 2023-01-29 DIAGNOSIS — M47816 Spondylosis without myelopathy or radiculopathy, lumbar region: Secondary | ICD-10-CM | POA: Insufficient documentation

## 2023-01-29 DIAGNOSIS — S39012A Strain of muscle, fascia and tendon of lower back, initial encounter: Secondary | ICD-10-CM

## 2023-01-29 DIAGNOSIS — M419 Scoliosis, unspecified: Secondary | ICD-10-CM | POA: Insufficient documentation

## 2023-01-29 DIAGNOSIS — K5900 Constipation, unspecified: Secondary | ICD-10-CM | POA: Insufficient documentation

## 2023-01-29 DIAGNOSIS — X500XXA Overexertion from strenuous movement or load, initial encounter: Secondary | ICD-10-CM | POA: Insufficient documentation

## 2023-01-29 DIAGNOSIS — T148XXA Other injury of unspecified body region, initial encounter: Secondary | ICD-10-CM | POA: Insufficient documentation

## 2023-01-29 LAB — BASIC METABOLIC PANEL
ANION GAP: 12 mmol/L (ref 4–13)
BUN/CREA RATIO: 14 (ref 6–22)
BUN: 10 mg/dL (ref 8–25)
CALCIUM: 9.4 mg/dL (ref 8.6–10.3)
CHLORIDE: 101 mmol/L (ref 96–111)
CO2 TOTAL: 24 mmol/L (ref 23–31)
CREATININE: 0.73 mg/dL (ref 0.60–1.05)
ESTIMATED GFR - FEMALE: 81 mL/min/BSA (ref >=60)
GLUCOSE: 84 mg/dL (ref 65–125)
POTASSIUM: 3.9 mmol/L (ref 3.5–5.1)
SODIUM: 137 mmol/L (ref 136–145)

## 2023-01-29 LAB — CBC WITH DIFF
BASOPHIL #: <0.1 10*3/uL (ref <=0.20)
BASOPHIL %: 1 %
EOSINOPHIL #: <0.1 10*3/uL (ref <=0.50)
EOSINOPHIL %: 1 %
HCT: 39.8 % (ref 34.8–46.0)
HGB: 13.1 g/dL (ref 11.5–16.0)
IMMATURE GRANULOCYTE #: 0.12 10*3/uL — ABNORMAL HIGH (ref <0.10)
IMMATURE GRANULOCYTE %: 1 % (ref 0.0–1.0)
LYMPHOCYTE #: 2.14 10*3/uL (ref 1.00–4.80)
LYMPHOCYTE %: 19 %
MCH: 29.6 pg (ref 26.0–32.0)
MCHC: 32.9 g/dL (ref 31.0–35.5)
MCV: 90 fL (ref 78.0–100.0)
MONOCYTE #: 1 10*3/uL (ref 0.20–1.10)
MONOCYTE %: 9 %
MPV: 9.4 fL (ref 8.7–12.5)
NEUTROPHIL #: 7.76 10*3/uL — ABNORMAL HIGH (ref 1.50–7.70)
NEUTROPHIL %: 69 %
PLATELETS: 293 10*3/uL (ref 150–400)
RBC: 4.42 10*6/uL (ref 3.85–5.22)
RDW-CV: 13.1 % (ref 11.5–15.5)
WBC: 11.2 10*3/uL — ABNORMAL HIGH (ref 3.7–11.0)

## 2023-01-29 LAB — URINALYSIS, MACRO/MICRO
BILIRUBIN: NEGATIVE mg/dL
BLOOD: NEGATIVE mg/dL
COLOR: NORMAL
GLUCOSE: NORMAL mg/dL
LEUKOCYTES: NEGATIVE WBCs/uL
NITRITE: NEGATIVE
PH: 5.5 (ref 5.0–8.0)
PROTEIN: NEGATIVE mg/dL
SPECIFIC GRAVITY: 1.008 (ref 1.005–1.030)
UROBILINOGEN: NORMAL mg/dL

## 2023-01-29 LAB — PT/INR: INR: 1 (ref 0.80–1.10)

## 2023-01-29 MED ORDER — SODIUM CHLORIDE 0.9 % INJECTION SOLUTION
10.0000 mL | INTRAMUSCULAR | Status: DC
Start: 2023-01-29 — End: 2023-01-30

## 2023-01-29 MED ORDER — IOPAMIDOL 370 MG IODINE/ML (76 %) INTRAVENOUS SOLUTION
60.0000 mL | INTRAVENOUS | Status: AC
Start: 2023-01-29 — End: 2023-01-29
  Administered 2023-01-29: 60 mL via INTRAVENOUS

## 2023-01-29 NOTE — ED Nurses Note (Signed)
The risk and benefits have been discussed with the patient in regards to placing a peripheral intravenous catheter (PIV) and/or drawing labs in the triage area. The patient was advised that they may have to wait in the ED waiting room after the PIV is placed. The patient was advised not to tamper with the PIV or infuse anything through the PIV. The patient was advised if there is any concern or problem with the PIV to contact a nurse or a staff representative to contact a nurse for the patient. The patient was advised if they choose to leave before being seen by a provider or without treatment; the patient needs to notify the a nurse or a staff representative to contact a nurse, so the PIV can be removed. The patient is aware not to leave the ED waiting room area while the PIV is in place. The patient verbalizes understanding and agrees with the plan of action.

## 2023-01-29 NOTE — ED Triage Notes (Signed)
Patient presents to ER with c/o lower back pain with occasional groin pain since Wednesday. Patient reports lifting something heavy on Wednesday. Also, reports was seen at urgent care last week and was treated for sinus congestion with antibiotic and steroid. Patient finished medicine on Tuesday.

## 2023-01-29 NOTE — ED Provider Notes (Signed)
[facility] - EMERGENCY DEPARTMENT  [address]  [name] [facility] [address]  [phone]    Triage:  Back Pain and Groin Pain    Most Recent Vitals    Flowsheet Row ED from 01/29/2023 in [facility] - Emergency   Department   Temperature 36.4 C (97.5 F) filed at... 01/29/2023 1932   Heart Rate 98 filed at... 01/29/2023 1932   Respiratory Rate 18 filed at... 01/29/2023 1932   BP (Non-Invasive) 160/92 filed at... 01/29/2023 1932   SpO2 95 % filed at... 01/29/2023 1932   Height 1.575 m (5\' 2" ) filed at... 01/29/2023 1932   Weight 32 kg (70 lb 8.8 oz) filed at... 01/29/2023 1932   BMI (Calculated) 12.93 filed at... 01/29/2023 1932   BSA (Calculated) 1.18 filed at... 01/29/2023 1932        HPI:  [name] is a 88 y.o. female p/w back pain onset yesterday. 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 She states immediately after this she was doing well, but states later that evening she began having some right lower back pain. Patient notes this pain has progressively worsened, and states this evening she noticed some lower abdominal tenderness prompting concern for UTI resulting in her visit to the ED. Patient reports she was recently prescribed abx, and steroids for a suspected sinus infection, which she completed on Tuesday. PMH significant for hypothyroidism, and fibromyalgia. She denies any fevers, chills, nausea, vomiting, new constipation, and all other complaints at this time.     ROS  Neuro, HNT, Eyes, CV, resp, GI GU, MSK, Skin, Psych reviewed and negative other than HPI Specifically Pt with no fevers, chills, nausea, vomiting, new constipation.    Past Medical History:  Past Medical History:   Diagnosis Date    Fibromyalgia     Hypercholesterolemia      Past Surgical History:  No past surgical history on file.  Social History:  Social History     Tobacco Use    Smoking status: Never    Smokeless  tobacco: Never   Vaping Use    Vaping status: Never Used   Substance Use Topics    Drug use: Never     Social History     Substance and Sexual Activity   Drug Use Never     Family History:  Family History   Problem Relation Age of Onset    Hypertension (High Blood Pressure) Mother     Hypertension (High Blood Pressure) Father        Pertinent Exam  Filed Vitals:    01/29/23 2115 01/29/23 2200 01/29/23 2300 01/30/23 0000   BP: (!) 167/86 (!) 152/70 125/68 (!) 152/69   Pulse: 84 76 81 85   Resp: 18 16 17 16    Temp:       SpO2: 94% 95% 95% 96%     AOx3, cachectic, CN intact, EOMI  RRR, no r/m/g  CTAB, no resp distress  Abd s, nt, nd, no peritoneal signs  Moving all extremities, no obvious deformities, right lateral, and paraspinal back tenderness.   No rashes or lesions      Course    Medical Decision Making  Impression: [name] presents with back pain     Plan: The following tests, imaging and medications will be ordered to investigate/rule out and evalaute [name]'s chief complaint while pt is in the  emergency department.  Vitals will be monitored, and pt will be rechecked in the ED.    Orders Placed This Encounter    CT ABDOMEN PELVIS W IV CONTRAST    CBC/DIFF    BASIC METABOLIC PANEL    PT/INR    CBC WITH DIFF    URINALYSIS WITH REFLEX MICROSCOPIC AND CULTURE IF POSITIVE    URINALYSIS, MACRO/MICRO    iopamidol (ISOVUE-370) 76% infusion     Labs Ordered/Reviewed   CBC WITH DIFF - Abnormal; Notable for the following components:       Result Value    WBC 11.2 (*)     NEUTROPHIL # 7.76 (*)     IMMATURE GRANULOCYTE # 0.12 (*)     All other components within normal limits   URINALYSIS, MACRO/MICRO - Abnormal; Notable for the following components:    KETONES Trace (*)     All other components within normal limits   BASIC METABOLIC PANEL - Normal   PT/INR - Normal    Narrative:     Coumadin therapy INR range for Conventional Anticoagulation is 2.0 to 3.0 and for Intensive Anticoagulation 2.5 to 3.5.    CBC/DIFF    Narrative:     The following orders were created for panel order CBC/DIFF.  Procedure                               Abnormality         Status                     ---------                               -----------         ------                     CBC WITH ZOXW[[id number]]                Abnormal            Final result                 Please view results for these tests on the individual orders.   URINALYSIS WITH REFLEX MICROSCOPIC AND CULTURE IF POSITIVE    Narrative:     The following orders were created for panel order URINALYSIS WITH REFLEX MICROSCOPIC AND CULTURE IF POSITIVE.  Procedure                               Abnormality         Status                     ---------                               -----------         ------                     URINALYSIS, MACRO/MICRO[[id number]]      Abnormal            Final result                 Please view results for these tests  on the individual orders.       All labs reviewed during ED course and at 13:03.    I have reviewed recent medical records    Radiographic Imaging:   CT ABDOMEN PELVIS W IV CONTRAST   Final Result   1. Severe scoliosis with lumbar spine degenerative changes.   2. Significant stool retention throughout the colon suggesting constipation.   3. Pelvic floor descent.         Radiologist location ID: [id number]               ED Recheck: Patient doing well. Vitals stable.  Pain controlled.     Consults or discussions with external providers: none    Procedures: IV, Labs, Imaging    Dispo/Summary:     Course Update:     Patient presents back pain.  Was moving heavy dresser.  Appears very frail.  Patient reports that she has longstanding issues with constipation and has a typical bowel regimen at home.  Imaging with no fracture.  Appears stable for discharge.  Expectant management discussed.    Patients history, physical exam, labs and imaging studies, reviewed prior to DC.  The patient was deemed suitable for discharge with   Discharge  Medication List as of 01/30/2023 12:19 AM      . Discharge and medication instructions were discussed with the patient and questions were addressed. The patient understands that they may return to the ED at any time for new or worsening symptoms, or if they have any other concerns. The patient is to follow up as scheduled or suggested below.  Please refer to discharge instructions for further information.  Disposition: Discharged  Follow up:   Marylee Floras, MD  [address]  [address]  [place] [address]  [phone]    Call   As needed    Clinical Impression:     Clinical Impression   Muscle strain (Primary)   Constipation, unspecified constipation type     Future appointments scheduled in [place]:   Future Appointments   Date Time Provider Department Center   03/09/2023  9:15 AM Angotti, Inocente Salles, MD [facility]       I am scribing for, and in the presence of, Dr. Elliot Cousin, MD for services provided on 01/29/2023.  Doretha Sou, SCRIBE   Doretha Sou, SCRIBE  01/29/2023, 22:03.    I personally performed the services described in this documentation, as scribed  in my presence, and it is both accurate  and complete.    Denton Lank, MD 02/05/2023, 13:04  Arcola Jansky, MD  Attending Physician  Emergency Medicine

## 2023-01-30 NOTE — ED Nurses Note (Signed)

## 2023-01-31 ENCOUNTER — Telehealth (HOSPITAL_COMMUNITY): Payer: Self-pay | Admitting: Internal Medicine

## 2023-01-31 NOTE — Progress Notes (Signed)
Post Ed Follow-Up    Post ED Follow-Up:   Document completed and/or attempted interactive contact(s) after transition to home after emergency department stay.:   Transition Facility and relevant Date:   Discharge Date: 01/30/23  Discharge from [facility] Emergency Department?: Yes  Discharge Facility: [facility]  Contacted by: Leeanne Rio RN  Contact method: Patient/Caregiver Telephone  Contact first attempt: 01/31/2023 11:19 AM  MyChart message sent?: No (Comment: Pending)

## 2023-02-04 ENCOUNTER — Other Ambulatory Visit: Payer: Self-pay

## 2023-02-04 ENCOUNTER — Observation Stay (HOSPITAL_COMMUNITY): Payer: Medicare Other | Admitting: Internal Medicine

## 2023-02-04 ENCOUNTER — Encounter (HOSPITAL_COMMUNITY): Payer: Self-pay | Admitting: Internal Medicine

## 2023-02-04 ENCOUNTER — Other Ambulatory Visit (HOSPITAL_BASED_OUTPATIENT_CLINIC_OR_DEPARTMENT_OTHER): Payer: Self-pay | Admitting: Internal Medicine

## 2023-02-04 ENCOUNTER — Encounter (HOSPITAL_BASED_OUTPATIENT_CLINIC_OR_DEPARTMENT_OTHER): Payer: Self-pay | Admitting: Internal Medicine

## 2023-02-04 ENCOUNTER — Emergency Department (HOSPITAL_COMMUNITY): Payer: Medicare Other

## 2023-02-04 ENCOUNTER — Observation Stay
Admission: EM | Admit: 2023-02-04 | Discharge: 2023-02-05 | Disposition: A | Discharge status: Home / Self Care | Payer: Medicare Other | Attending: Internal Medicine | Admitting: Internal Medicine

## 2023-02-04 DIAGNOSIS — E782 Mixed hyperlipidemia: Secondary | ICD-10-CM | POA: Insufficient documentation

## 2023-02-04 DIAGNOSIS — Z681 Body mass index (BMI) 19 or less, adult: Secondary | ICD-10-CM | POA: Insufficient documentation

## 2023-02-04 DIAGNOSIS — E039 Hypothyroidism, unspecified: Secondary | ICD-10-CM | POA: Insufficient documentation

## 2023-02-04 DIAGNOSIS — K5641 Fecal impaction: Secondary | ICD-10-CM | POA: Diagnosis present

## 2023-02-04 DIAGNOSIS — E43 Unspecified severe protein-calorie malnutrition: Secondary | ICD-10-CM | POA: Insufficient documentation

## 2023-02-04 DIAGNOSIS — K5900 Constipation, unspecified: Principal | ICD-10-CM | POA: Insufficient documentation

## 2023-02-04 LAB — BASIC METABOLIC PANEL
ANION GAP: 8 mmol/L (ref 4–13)
BUN/CREA RATIO: 9 (ref 6–22)
BUN: 6 mg/dL — ABNORMAL LOW (ref 8–25)
CALCIUM: 9 mg/dL (ref 8.6–10.3)
CHLORIDE: 98 mmol/L (ref 96–111)
CO2 TOTAL: 24 mmol/L (ref 23–31)
CREATININE: 0.7 mg/dL (ref 0.60–1.05)
ESTIMATED GFR - FEMALE: 85 mL/min/BSA (ref >=60)
GLUCOSE: 84 mg/dL (ref 65–125)
POTASSIUM: 4.3 mmol/L (ref 3.5–5.1)
SODIUM: 130 mmol/L — ABNORMAL LOW (ref 136–145)

## 2023-02-04 LAB — CBC WITH DIFF
BASOPHIL #: <0.1 10*3/uL (ref <=0.20)
BASOPHIL %: 1 %
EOSINOPHIL #: <0.1 10*3/uL (ref <=0.50)
EOSINOPHIL %: 1 %
HCT: 38.8 % (ref 34.8–46.0)
HGB: 13.5 g/dL (ref 11.5–16.0)
IMMATURE GRANULOCYTE #: <0.1 10*3/uL (ref <0.10)
IMMATURE GRANULOCYTE %: 0 % (ref 0.0–1.0)
LYMPHOCYTE #: 1.69 10*3/uL (ref 1.00–4.80)
LYMPHOCYTE %: 21 %
MCH: 30.3 pg (ref 26.0–32.0)
MCHC: 34.8 g/dL (ref 31.0–35.5)
MCV: 87.2 fL (ref 78.0–100.0)
MONOCYTE #: 0.65 10*3/uL (ref 0.20–1.10)
MONOCYTE %: 8 %
MPV: 9.4 fL (ref 8.7–12.5)
NEUTROPHIL #: 5.72 10*3/uL (ref 1.50–7.70)
NEUTROPHIL %: 69 %
PLATELETS: 224 10*3/uL (ref 150–400)
RBC: 4.45 10*6/uL (ref 3.85–5.22)
RDW-CV: 13.2 % (ref 11.5–15.5)
WBC: 8.2 10*3/uL (ref 3.7–11.0)

## 2023-02-04 MED ORDER — LACTULOSE 10 GRAM/15 ML ORAL SOLUTION
60.0000 mL | Freq: Every day | ORAL | 1 refills | Status: DC
Start: 2023-02-04 — End: 2023-02-05

## 2023-02-04 NOTE — Care Plan (Signed)
Problem: Health Knowledge, Opportunity to Enhance (Adult,Obstetrics,Pediatric)  Goal: Knowledgeable about Health Subject/Topic  Description: Patient will demonstrate the desired outcomes by discharge/transition of care.  Outcome: Ongoing (see interventions/notes)     Problem: Pain Acute  Goal: Optimal Pain Control and Function  Outcome: Ongoing (see interventions/notes)     Problem: Malnutrition  Goal: Improved Nutritional Intake  Outcome: Ongoing (see interventions/notes)     Problem: Infection  Goal: Absence of Infection Signs and Symptoms  Outcome: Ongoing (see interventions/notes)     Problem: Constipation  Goal: Effective Bowel Elimination  Outcome: Ongoing (see interventions/notes)

## 2023-02-04 NOTE — ED Nurses Note (Signed)
NO stool produced with enema

## 2023-02-04 NOTE — ED Triage Notes (Signed)
Was seen on 7/18 for constipation. Has tried several things and still unable to have BM.

## 2023-02-04 NOTE — ED Nurses Note (Signed)
PT attempted to have another BM, only liquid produced, no stools seen

## 2023-02-04 NOTE — ED Provider Notes (Signed)
Department of Emergency Medicine    HPI - 02/04/2023      Patient is a 88 y.o.  female presenting to the ED with chief complaint of constipation.     History obtained from independent historian: Patient and pt's daughter , who stated that the patient has had complaints of constipation.   The pt explains that she has not had a BM in 3 weeks. She notes that she was seen previously in the ED for her complaint of constipation.  Per chart review, pt was seen on the aforementioned date for c/o back pain and had denied having constipation at that time.  Pt notes that she was then seen by her PCP, who prescribed her laxatives. The pt's daughter explains that the pt took several different kinds of laxatives over the course of the past few weeks with no resulting BM. She notes that the pt's PCP suggested presenting to the ED if medication did not result in BM.   Pt has a PMH including fibromyalgia and HLD.   Pt denies having any other complaints or concerns at this time.       Physical Exam:     Nursing note and vitals reviewed.  Vital signs reviewed as above. No acute distress.   Constitutional: Pt is well-developed, cachectic.   Head: Normocephalic and atraumatic.   Eyes: Conjunctivae are normal. Pupils are equal, round, and reactive to light. EOM are intact  Neck: Soft, supple, full range of motion.  Cardiovascular: RRR.  No Murmurs/rubs/gallops. Distal pulses present and equal bilaterally.  Pulmonary/Chest: Normal BS BL with no distress. No audible wheezes or crackles are noted.  Abdominal: Soft, nontender, nondistended.  No rebound, guarding, or masses.  GU: DRE: No stool mass palpable in rectum. No external hemorrhoids visible.   Musculoskeletal: Normal range of motion. No deformities.  Exhibits no edema and no tenderness.   Neurological: CNs 2-12 grossly intact. Free movement with normal strength and sensation in all extremities. No tremors. No facial droop or slurred speech. Alert and oriented x 3. No focal deficits  noted.  Skin: Warm and dry. No rash or lesions  Psychiatric: Patient has a normal mood and affect.     No results found for this or any previous visit (from the past 12 hour(s)).  XR KUB AND UPRIGHT ABDOMEN   Final Result   Suspect moderate distal stool retention.                        Radiologist location ID: [id number]               MDM:   During the patient's stay in the emergency department, the above listed imaging and/or labs were performed to assist with medical decision making and were reviewed by myself when available for review. Orders placed are listed at end of note.     X-ray shows moderate stool retention, no obstruction or free air    Patient unable to have bowel movement with enemas in the ED. Discussed with Dr. Jules Schick who agrees to admit for further management        Risk: Prescription Drug Management        Pt remained stable throughout the emergency department course.      Clinical Impression:  Constipation    Disposition:  Admitted        Orders Placed This Encounter    XR KUB AND UPRIGHT ABDOMEN    CBC/DIFF    BASIC METABOLIC PANEL  CBC WITH DIFF     I am scribing for, and in the presence of Dr. Nehemiah Settle, MD, for services provided on 02/04/2023.  Roselyn Bering, SCRIBE   Roselyn Bering, SCRIBE  02/04/2023, 18:57.    I personally performed the services described in this documentation, as scribed  in my presence, and it is both accurate  and complete.    Lafayette Dragon, MD  Lafayette Dragon, MD

## 2023-02-04 NOTE — Telephone Encounter (Signed)
The patient was seen in the ER 01-29-23 for a back sprain. She was told at that time that CT indicated she had significant stool retention throughout the colon and is constipated. She has tried OTC medications and an enema and nothing is working. Is wanting to know if there is something that could be ordered to help her or could home health be ordered to help her.  Danella Deis, MA

## 2023-02-04 NOTE — ED Nurses Note (Signed)
Started sse with enema infused into rectal area

## 2023-02-04 NOTE — Telephone Encounter (Signed)
Increase lactulose to 60 cc p.o. q.day starting this morning.  If this does not relieve her symptoms, she will need to go to the ER

## 2023-02-05 ENCOUNTER — Encounter (HOSPITAL_COMMUNITY): Payer: Self-pay | Admitting: Internal Medicine

## 2023-02-05 DIAGNOSIS — K5641 Fecal impaction: Secondary | ICD-10-CM

## 2023-02-05 DIAGNOSIS — E782 Mixed hyperlipidemia: Secondary | ICD-10-CM

## 2023-02-05 DIAGNOSIS — E039 Hypothyroidism, unspecified: Secondary | ICD-10-CM

## 2023-02-05 DIAGNOSIS — E559 Vitamin D deficiency, unspecified: Secondary | ICD-10-CM

## 2023-02-05 DIAGNOSIS — E43 Unspecified severe protein-calorie malnutrition: Secondary | ICD-10-CM

## 2023-02-05 DIAGNOSIS — E785 Hyperlipidemia, unspecified: Secondary | ICD-10-CM

## 2023-02-05 MED ORDER — LACTULOSE 10 GRAM/15 ML ORAL SOLUTION
60.0000 mL | Freq: Every day | ORAL | Status: DC
Start: 2023-02-05 — End: 2023-02-12

## 2023-02-05 MED ORDER — LACTULOSE 20 GRAM/30 ML ORAL SOLUTION
60.0000 mL | Freq: Every day | ORAL | Status: DC
Start: 2023-02-05 — End: 2023-02-05
  Administered 2023-02-05: 60 mL via ORAL
  Filled 2023-02-05 (×2): qty 60

## 2023-02-05 NOTE — Care Management Notes (Signed)
[facility]  Care Management Note    Patient Name: [name]  Date of Birth: 05-Sep-1934  Sex: female  Date/Time of Admission: 02/04/2023  6:54 PM  Room/Bed: 4102/A  Payor: MEDICARE / Plan: MEDICARE PART A AND B / Product Type: Medicare /    LOS: 0 days   PCP: Marylee Floras, MD    Admitting Diagnosis:  Fecal impaction of colon (CMS [facility]) [K56.41]    Assessment:      02/05/23 1152   Referral Information   Admission Type observation   Observation Form   Observation Form Given MOON form given   MOON form explained/reviewed with:  Patient;Patient Representative;verbalized understanding   MOON form given to PATIENT   MOON form date of delivery 02/05/23   MOON form time of delivery 0916     Visited patient, daughter @ bedside, explained observation status/MOON. Patient verbalized understanding and gave daughter consent to sign/date/time MOON. Patient was provided a copy of the MOON letter.     Discharge Plan:      The patient will continue to be evaluated for developing discharge needs.     Case Manager: Jorene Minors Robinson-CM Assistant  Phone: [phone]

## 2023-02-05 NOTE — H&P (Signed)
[facility]  General  History and Physical    Haley, Cantrell Start Date:  02/04/2023  Inpatient Admission Date:     Date of Birth:  August 25, 1934      PCP: Marylee Floras, MD  Chief Complaint:  Constipation       HPI: [name] is a 88 y.o., White female who presents with three-week history of worsening constipation with reportedly no bowel movement over this time frame.  Flatus was noted.  Denied nausea or vomiting.  No abdominal pain.  No fever, chills or sweats.  No precipitating, exacerbating or relieving events.  Patient reports she has been compliant with her medical regimen without complication.  Previous episodes of severe constipation have been noted.  She denies any hematemesis, melena or hematochezia.    Patient Active Problem List    Diagnosis Date Noted    Fecal impaction of colon (CMS HCC) 02/05/2023    Protein-calorie malnutrition, unspecified severity (CMS HCC) 09/08/2022    Vitamin D deficiency 12/23/2016    High risk medication use 12/23/2016    HLD (hyperlipidemia) 12/03/2016    Hypothyroidism 06/30/2006       Past Medical History:   Diagnosis Date    Fibromyalgia     Hypercholesterolemia            Past Surgical History was reviewed and is negative for other surgeries        Medications Prior to Admission       Prescriptions    fluconazole (DIFLUCAN) 100 mg Oral Tablet    Take 1 Tablet (100 mg total) by mouth Once a day    Ibuprofen (MOTRIN) 800 mg Oral Tablet    Take 1 Tablet (800 mg total) by mouth Three times a day as needed for Pain    lactulose (ENULOSE) 10 gram/15 mL Oral Solution    Take 30 mL by mouth Once a day    lactulose (ENULOSE) 10 gram/15 mL Oral Solution    Take 60 mL by mouth Once a day    levothyroxine (SYNTHROID) 75 mcg Oral Tablet    Take 1 Tablet (75 mcg total) by mouth Every morning    nitrofurantoin (MACROBID) 100 mg Oral Capsule    Take 1 Capsule (100 mg total) by mouth Twice daily    nystatin (NYSTOP) 100,000 unit/gram Powder    Apply  topically Twice daily    Patient taking differently:  Apply 0.1 g (10,000 Units total) topically Twice daily    potassium chloride (KLOR-CON) 10 mEq Oral Tablet Sustained Release    Take 1 Tablet (10 mEq total) by mouth Once a day    Patient not taking:  Reported on 02/04/2023          lactulose (ENULOSE) 10g per 15mL oral liquid, 60 mL, Oral, Daily        Allergies   Allergen Reactions    Lactose Rash    Grass Pollen        Social History     Tobacco Use    Smoking status: Never    Smokeless tobacco: Never   Substance Use Topics    Alcohol use: Not on file       Family History:    Family Medical History:       Problem Relation (Age of Onset)    Hypertension (High Blood Pressure) Mother, Father              ROS: Other than ROS in the HPI,  all other systems were negative.  Cardiovascular-the patient denies chest pain, palpitations, PND, orthopnea, or peripheral edema.  Pulmonary-patient denies shortness of breath, dyspnea on exertion, cough, sputum production or hemoptysis.  Neurologic-patient denies hemiparesis, paresthesias, dysesthesias, dysarthrias, balance problems, syncopal episodes, seizure disorder or headache.  GI-see HPI  GU-patient denies dysuria, hematuria, urinary frequency, kidney stone, kidney infection, flank pain, fever, chills, diaphoresis or night sweats.  Endocrine-patient denies change in appetite, thirst, weight or temperature intolerance.  Musculoskeletal-the patient denies arthralgias, myalgias, myopathies or weakness.  Hematologic-patient denies any clotting abnormalities, bleeding diatheses or bruising nor any blood dyscrasias.  Integumentary-patient denies rashes, lesions or any other changing skin disorder  ENT-patient denies sinus, throat or ear congestion, drainage or pain   Psychiatric-patient denies anxiety, depression or other behavioral disturbance    DNR Status:  Full Code    EXAM:  Temperature: 36.6 C (97.9 F)  Heart Rate: 82  BP (Non-Invasive): 116/64  Respiratory Rate: 18  SpO2:  95 %  Constitutional: no distress  Eyes: Conjunctiva clear.  ENT: ENMT without erythema or injection, mucous membranes moist.  Neck: supple, symmetrical, trachea midline and no adenopathy  Respiratory: Clear to auscultation bilaterally.   Cardiovascular: regular rate and rhythm, S1, S2 normal, no murmur, click, rub or gallop  No JVD  Homans sign is negative, no sign of DVT  pedal edema 0+ bilateral  Gastrointestinal: Soft, non-tender, Bowel sounds normal, non-distended, No hepatosplenomegaly, No masses, Abdominal bruit absent  Genitourinary: Deferred  Musculoskeletal: Head atraumatic and normocephalic and Other joints are grossly normal  Integumentary:  Skin warm and dry  Neurologic: Grossly normal  Lymphatic/Immunologic/Hematologic: No lymphadenopathy  Psychiatric: Normal        Labs:    Lab Results Today:    Results for orders placed or performed during the hospital encounter of 02/04/23 (from the past 24 hour(s))   BASIC METABOLIC PANEL   Result Value Ref Range    SODIUM 130 (L) 136 - 145 mmol/L    POTASSIUM 4.3 3.5 - 5.1 mmol/L    CHLORIDE 98 96 - 111 mmol/L    CO2 TOTAL 24 23 - 31 mmol/L    ANION GAP 8 4 - 13 mmol/L    CALCIUM 9.0 8.6 - 10.3 mg/dL    GLUCOSE 84 65 - 161 mg/dL    BUN 6 (L) 8 - 25 mg/dL    CREATININE 0.96 0.45 - 1.05 mg/dL    BUN/CREA RATIO 9 6 - 22    ESTIMATED GFR - FEMALE 85 >=60 mL/min/BSA   CBC WITH DIFF   Result Value Ref Range    WBC 8.2 3.7 - 11.0 x10^3/uL    RBC 4.45 3.85 - 5.22 x10^6/uL    HGB 13.5 11.5 - 16.0 g/dL    HCT 40.9 81.1 - 91.4 %    MCV 87.2 78.0 - 100.0 fL    MCH 30.3 26.0 - 32.0 pg    MCHC 34.8 31.0 - 35.5 g/dL    RDW-CV 78.2 95.6 - 21.3 %    PLATELETS 224 150 - 400 x10^3/uL    MPV 9.4 8.7 - 12.5 fL    NEUTROPHIL % 69.0 %    LYMPHOCYTE % 21.0 %    MONOCYTE % 8.0 %    EOSINOPHIL % 1.0 %    BASOPHIL % 1.0 %    NEUTROPHIL # 5.72 1.50 - 7.70 x10^3/uL    LYMPHOCYTE # 1.69 1.00 - 4.80 x10^3/uL    MONOCYTE # 0.65 0.20 - 1.10 x10^3/uL  EOSINOPHIL # <0.10 <=0.50 x10^3/uL     BASOPHIL # <0.10 <=0.20 x10^3/uL    IMMATURE GRANULOCYTE % 0.0 0.0 - 1.0 %    IMMATURE GRANULOCYTE # <0.10 <0.10 x10^3/uL       Imaging Studies:    Results for orders placed or performed during the hospital encounter of 02/04/23   XR KUB AND UPRIGHT ABDOMEN     Status: None    Narrative    Hadassah L Miceli    PROCEDURE DESCRIPTION: XR KUB AND UPRIGHT ABDOMEN    CLINICAL INDICATION: Abd pain, constipation    TECHNIQUE: 2 views / 2 images submitted.    COMPARISON: CT abdomen pelvis dated January 29, 2023      FINDINGS: There is scoliosis and degenerative change of the spine. The visualized lung bases are clear. No free air is detected.      Impression    Suspect moderate distal stool retention.                Radiologist location ID: [id number]           DVT RISK FACTORS HAVE BEN ASSESSED AND PROPHYLAXIS ORDERED (SEE [facility] ONLINE - REFERENCE TOOLS - MD, DVT PROPHY OR POCKET CARD)    Assessment/Plan:  Fecal colonic Impaction-will administer soapsuds enemas.  Increase lactulose.  Initiate clear liquid diet.  Close follow-up.  Acquired hypothyroidism-stable, continue as is  Mixed hyperlipidemia-stable, continue as is  Severe protein caloric malnutrition-stable, continue as is-encourage p.o. intake  Peptic ulcer disease, deep vein thrombosis and atelectasis prophylaxis  Discharge plans pending-patient is OPO    DVT/PE Prophylaxis: Not indicated, low risk for VTE  Disposition Planning: Home discharge     More than 75 minutes were spent reviewing the patient's records, acquiring history, performing physical exam, reviewing labs and x-ray reports, reviewing procedures as applicable, ordering medications as applicable, educating the patient in plain [place], coordinating care with staff and other providers as applicable, independent interpretation of lab results as applicable, medical decision making, post-visit activities and documentation of the above.  Old medical records were reviewed as available and no other relevant  information was obtained.    This report was generated using voice recognition software, and is inherently subject to errors including those of syntax and "sound-alike " substitutions which may escape proof reading. In such instances, original meaning may be extrapolated by contextual derivation.     Chevis Pretty, MD

## 2023-02-05 NOTE — Nurses Notes (Signed)
Patient is alert and oriented times four. IV remains clean, dry, and intact. Vitals are stable. No pain reported during shift. Free from falls.   Alphonzo Lemmings, GN

## 2023-02-05 NOTE — Ancillary Notes (Signed)
Medical Nutrition Therapy Assessment        SUBJECTIVE:   [name] is an 88 yo female admitted with fecal colonic impaction. PMH includes fibromyalgia and HLD. Diet recently advanced to regular with high fiber this morning. PO intake is poor, 25% intake during breakfast on clear liquids and 25% during lunch on regular diet. Pt endorses "a little bit of indigestion" after lunch, but states she tolerated it well overall. Endorses chronic loss of appetite, states she never feels hungry. No recent significant weight loss per weight chart but pt is underweight with BMI of 11.3. 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 Does not drink oral supplements at home. Eats primarily fish, potato wedges, carrots. Noted plans to discharge today.     Pt meet criteria for severe protein calorie malnutrition.    OBJECTIVE:     Current Diet Order/Nutrition Support:  MNT PROTOCOL FOR DIETITIAN  DIET REGULAR Modifications/limitations: HIGH FIBER     Height Used for Calculations: 165.1 cm (5\' 5" )  Weight Used For Calculations: 30.7 kg (67 lb 10.9 oz)  BMI (kg/m2): 11.29  BMI Assessment: less than 18.5 - underweight  Ideal Body Weight (IBW) (kg): 57.29  % Ideal Body Weight: 53.59           Wt Readings from Last 5 Encounters:   02/04/23 (!) 30.7 kg (67 lb 10.9 oz)   01/29/23 (!) 32 kg (70 lb 8.8 oz)   09/08/22 (!) 33.6 kg (74 lb)   03/05/22 (!) 31.8 kg (70 lb)   09/04/21 (!) 34.5 kg (76 lb)      Comprehensive Metabolic Profile    Lab Results   Component Value Date/Time    SODIUM 130 (L) 02/04/2023 09:55 PM    POTASSIUM 4.3 02/04/2023 09:55 PM    CHLORIDE 98 02/04/2023 09:55 PM    CO2 24 02/04/2023 09:55 PM    ANIONGAP 8 02/04/2023 09:55 PM    BUN 6 (L) 02/04/2023 09:55 PM    CREATININE 0.70 02/04/2023 09:55 PM    GLUCOSE 84 02/04/2023 09:55 PM    Lab Results   Component Value Date/Time    CALCIUM 9.0 02/04/2023 09:55 PM    ALBUMIN 4.2 09/08/2022 08:44 AM    TOTALPROTEIN 7.1 09/08/2022 08:44 AM    ALKPHOS 49 (L) 09/08/2022 08:44 AM    AST 19  09/08/2022 08:44 AM    ALT <5 (L) 09/08/2022 08:44 AM    TOTBILIRUBIN 1.2 09/08/2022 08:44 AM         Current Facility-Administered Medications   Medication Dose Route Frequency    lactulose (ENULOSE) 20g per 30mL oral liquid  60 mL Oral Daily      Physical Assessment:   No wounds or edema noted. LBM: 7/25.    A nutrition-focused physical exam was performed.  Results:  Subcutaneous fat loss (3+): orbital 3+, cheek 3+, tricep 3+  Muscle mass loss (3+): temple 3+, clavicle 3+, shoulder 3+    Estimated Needs:  Energy Calorie Requirements: 1050-1175 kcal per day (34-38 kcals/30.7 kg)  Protein Requirements (gms/day): 43-55 g per day (1.4-1.8g/30.7 kg)  Fluid Requirements: 1050-1175 ml per day (34-38 mLs/30.7 kg)    Plan/Interventions:   1. Plans to discharge today  2. Regular diet with high fiber  3. Will order oral supplements if pt does not discharge today  - Declines milk-based supplements due to milk allergy    RD available as needed.    Nutrition Diagnosis:   Unspecified severe protein-calorie malnutrition  Related to: inadequate nutrient  intake   As evidenced by: Energy Intake: Less than 75% of EER in greater than or equal to 3 months, Body Fat Loss: Severe (Subcutaneous fat loss (3+): orbital 3+, cheek 3+, tricep 3+), Muscle Mass Loss: Severe (Muscle mass loss (3+): temple 3+, clavicle 3+, shoulder 3+), BMI Assessment: less than 18.5 - underweight (BMI: 11.3).      Creola Corn, MS, RD, LD  02/05/2023, 15:01  [facility] Clinical Dietitian  Phone ext [phone]  Ascom ext [phone]

## 2023-02-05 NOTE — Discharge Summary (Signed)
[facility]  DISCHARGE SUMMARY      PATIENT NAME:  [name], [name]  MRN:  [id number]  DOB:  July 13, 1935      ENCOUNTER START DATE: 02/04/2023  INPATIENT ADMISSION DATE:     DISCHARGE DATE:  02/05/2023    ATTENDING PHYSICIAN: Marylee Floras, MD  PRIMARY CARE PHYSICIAN: Marylee Floras, MD     ADMISSION DIAGNOSIS: Fecal impaction of colon (CMS [facility])  Chief Complaint   Patient presents with    Constipation     Pt to ED with daughter with reports of constipation that has been ongoing for the past 3 weeks.  Pt has taken PO meds, suppositories and enemas without success.  Pt was advised to come to ED           DISCHARGE DIAGNOSIS:  Fecal colonic impaction  Hospital Problems) (* Primary Problem)    Diagnosis Date Noted    *Fecal impaction of colon (CMS [facility]) 02/05/2023      Resolved Hospital Problems   No resolved problems to display.     Active Non-Hospital Problems    Diagnosis Date Noted    Protein-calorie malnutrition, unspecified severity (CMS HCC) 09/08/2022    Vitamin D deficiency 12/23/2016    High risk medication use 12/23/2016    HLD (hyperlipidemia) 12/03/2016    Hypothyroidism 06/30/2006        DISCHARGE MEDICATIONS:     Current Discharge Medication List        CONTINUE these medications which have CHANGED during your visit.        Details   lactulose 10 gram/15 mL Solution  Commonly known as: ENULOSE  What changed:   how much to take  Another medication with the same name was removed. Continue taking this medication, and follow the directions you see here.   60 mL, Oral, DAILY  Refills: 0            CONTINUE these medications - NO CHANGES were made during your visit.        Details   Ibuprofen 800 mg Tablet  Commonly known as: MOTRIN   800 mg, Oral, 3 TIMES DAILY PRN  Qty: 270 Tablet  Refills: 3     levothyroxine 75 mcg Tablet  Commonly known as: SYNTHROID   75 mcg, Oral, EVERY MORNING  Qty: 90 Tablet  Refills: 3            STOP taking these medications.      fluconazole 100 mg Tablet  Commonly known  as: DIFLUCAN     nitrofurantoin monohyd/m-cryst 100 mg Capsule  Commonly known as: Macrobid     nystatin 100,000 unit/gram Powder  Commonly known as: NYSTOP     potassium chloride 10 mEq Tablet Sustained Release  Commonly known as: KLOR-CON              DISCHARGE INSTRUCTIONS:      DISCHARGE INSTRUCTION - RESUME HOME DIET     Diet: HIGH FIBER      FOLLOW-UP: INTERNAL MEDICINE - [facility](M.Cherye Gaertner) - [place], [place]     Follow-up in: 1 WEEK    Reason for visit: HOSPITAL DISCHARGE    Post Discharge Destination: Home      Post-Discharge Follow Up Appointments       Follow up with Internal Medicine, Physician Office Building    Phone: [phone]    Where: [address], [place] [address]      Monday Mar 09, 2023  WELL SCREEN with Marylee Floras, MD at  9:15 AM      Internal Medicine, Physician Office Building  Physician Office Building, [facility]  [address]  [place] [address]  [phone]            REASON FOR HOSPITALIZATION AND HOSPITAL COURSE:  This is a 88 y.o., female       admitted to [facility] with a fecal colonic impaction.  Enemas relieve the patient's symptomatology with large soft bowel movement noted at the time of discharge.  No complication or sequela was noted.  Lactulose dosing was increased and will be followed up closely as an outpatient.    As to the rest of the patient's medical problems, these remained stable throughout the whole of the hospitalization and required no change in their medical regimens. Patient is therefore discharged in a stable condition with medications per the med rec form.  Follow-up will be arranged in one-week with Dr. Harrie Foreman.  Follow-up labs should include chemical profile, CBC, urinalysis, EKG, chest x-ray and drug levels as deemed necessary.  Lab abnormalities noted prior to the patient's discharge were not destabilizing and can easily be follow-up as an outpatient.  Complications and  alternative therapies were described to the patient in plain English in detail.  An opportunity for 2nd opinion was offered.  Patient was encouraged to contact us should symptoms change or worsen.  Hospital course was discussed with the patient in plain English in detail with stated understanding and agreement with the treatment plan as outlined above.      SIGNIFICANT PHYSICAL FINDINGS:   SIGNIFICANT LAB:   SIGNIFICANT RADIOLOGY:   CONSULTATIONS:   PROCEDURES PERFORMED:         COURSE IN HOSPITAL:  See above    DOES PATIENT HAVE ADVANCED DIRECTIVES:  No, Information Offered and Refused    ADVANCED CARE PLANNING - Not applicable for this patient    CONDITION ON DISCHARGE: Alert    DISCHARGE DISPOSITION:  Home discharge     Copies sent to Care Team         Relationship Specialty Notifications Start End    Marylee Floras, MD PCP - General INTERNAL MEDICINE  12/23/16     Phone: [phone] Fax: [phone]         [address] [facility] [address]          More than 75 minutes were spent reviewing the patient's records, acquiring history, performing physical exam, reviewing labs and x-ray reports, reviewing procedures as applicable, ordering medications as applicable, educating the patient in plain English, coordinating care with staff and other providers as applicable, independent interpretation of lab results as applicable, medical decision making, post-visit activities and documentation of the above.  Old medical records were reviewed as available and no other relevant information was obtained.    This report was generated using voice recognition software, and is inherently subject to errors including those of syntax and "sound-alike " substitutions which may escape proof reading. In such instances, original meaning may be extrapolated by contextual derivation.      Chevis Pretty, MD

## 2023-02-06 ENCOUNTER — Telehealth (HOSPITAL_COMMUNITY): Payer: Self-pay | Admitting: Internal Medicine

## 2023-02-06 NOTE — Telephone Encounter (Signed)
Transition of Care Contact Information  Discharge Date: 02/05/2023  Transition Facility Type--Hospital (Inpatient or Observation)  Facility [facility]  Interactive Contact(s): Completed or attempted contact indicated by Date/Time  First Attempt Call: 02/06/2023  9:13 AM  Second Attempted Contact: 02/06/2023 10:42 AM  Contact Method(s)-- Patient/Caregiver Telephone  Clinical Staff Name/Role who Adriana Simas, RN  Transition Note:Called [phone]- unable to LVM; MyChart unavailable.

## 2023-02-12 ENCOUNTER — Ambulatory Visit: Payer: Medicare Other | Attending: Internal Medicine | Admitting: Internal Medicine

## 2023-02-12 ENCOUNTER — Encounter (HOSPITAL_BASED_OUTPATIENT_CLINIC_OR_DEPARTMENT_OTHER): Payer: Self-pay | Admitting: Internal Medicine

## 2023-02-12 ENCOUNTER — Other Ambulatory Visit: Payer: Self-pay

## 2023-02-12 VITALS — BP 110/70 | HR 89 | Ht 62.0 in | Wt <= 1120 oz

## 2023-02-12 DIAGNOSIS — R2681 Unsteadiness on feet: Secondary | ICD-10-CM | POA: Insufficient documentation

## 2023-02-12 DIAGNOSIS — K5641 Fecal impaction: Secondary | ICD-10-CM | POA: Insufficient documentation

## 2023-02-12 DIAGNOSIS — E43 Unspecified severe protein-calorie malnutrition: Secondary | ICD-10-CM | POA: Insufficient documentation

## 2023-02-12 DIAGNOSIS — Z09 Encounter for follow-up examination after completed treatment for conditions other than malignant neoplasm: Secondary | ICD-10-CM | POA: Insufficient documentation

## 2023-02-12 MED ORDER — LACTULOSE 10 GRAM/15 ML ORAL SOLUTION
30.00 mL | Freq: Two times a day (BID) | ORAL | 11 refills | Status: AC
Start: 2023-02-12 — End: 2024-02-07

## 2023-02-12 MED ORDER — LACTULOSE 10 GRAM/15 ML ORAL SOLUTION
60.0000 mL | Freq: Every day | ORAL | 5 refills | Status: DC
Start: 2023-02-12 — End: 2023-02-12

## 2023-02-12 MED ORDER — NYSTATIN 100,000 UNIT/GRAM TOPICAL CREAM
TOPICAL_CREAM | Freq: Two times a day (BID) | CUTANEOUS | 5 refills | Status: AC
Start: 2023-02-12 — End: ?

## 2023-02-12 NOTE — H&P (Signed)
[facility] Internal Medicine  [address] [place] [address] [place] [address]  Dept: [phone]  Dept Fax: [phone]    NAME:  [name]  DOB:  Aug 02, 1934  AGE:  88 y.o.  MRN:  [id number]  APPT:  02/12/2023  9:30 AM EDT    SUBJECTIVE:  [name] is a 88 y.o. female is here today for hospital discharge follow up.  She was admitted from 02/04/23 to 02/05/23 for fecal impaction.  Required multiple enemas.  Finally began having bowel movements.  Discharged on lactulose but prescription ran out several days ago.  No refills were given..    Transition of Care Contact Information  Discharge Date: 02/05/2023  Transition Facility Type--Hospital (Inpatient or Observation)  Facility [facility]  Interactive Contact(s): Completed or attempted contact indicated by Date/Time  First Attempt Call: 02/06/2023  9:13 AM  Second Attempted Contact: 02/06/2023 10:42 AM  Contact Method(s)-- Patient/Caregiver Telephone  Clinical Staff Name/Role who Adriana Simas, RN  Transition Note:Called [phone]- unable to LVM; MyChart unavailable.     Unable to Complete TCM due to:  Further information may be documented in relevant telephone or outreach encounter.      Face to face completed on  02/12/2023    Results reviewed:     Labs and radiological images reviewed in Epic.    I have reviewed the patient's medical, surgical, family and social history in detail today, 02/12/2023  and updated the computerized patient record as appropriate.  I reviewed the discharge summary from the recent hospital stay.    Current Outpatient Medications   Medication Sig    Ibuprofen (MOTRIN) 800 mg Oral Tablet Take 1 Tablet (800 mg total) by mouth Three times a day as needed for Pain    lactulose (ENULOSE) 10 gram/15 mL Oral Solution Take 30 mL by mouth Twice daily for 360 days    levothyroxine (SYNTHROID) 75 mcg Oral Tablet Take 1 Tablet (75 mcg total) by mouth Every morning    nystatin (MYCOSTATIN) 100,000 unit/gram Cream  Apply topically Twice daily       Discharge medications were reviewed with the patient and the current medication list has been reconciled.      REVIEW OF SYSTEMS:  CARDIAC:  No chest pain, DOE, or palpitations. No orthopnea or PND.    PULMONARY:  No cough, sputum, or hemoptysis. No fever,chills, or night sweats.    GI:  No nausea or vomiting. No abdominal pain.  Bowels moving every 3 days.  Hard.          No melena or bright red rectal bleeding.    GU :  Voiding without difficulty         OBJECTIVE:   Physical Exam:  BP 110/70 (Site: Left Arm)   Pulse 89   Ht 1.575 m (5\' 2" )   Wt (!) 31.2 kg (68 lb 12.8 oz)   LMP  (LMP Unknown)   SpO2 95%   BMI 12.58 kg/m       General appearance: alert, oriented x 3, in her normal state, cooperative, not in apparent distress, appearing stated age   Neck: No lymphadenopathy or thyroid anomalies noted, carotids 2 + bilaterally without bruit  Lungs: clear to auscultation bilaterally. No crackles or wheeze noted. Normal respiratory effort  Heart: No JVD. Regular rate and rhythm, S1, S2 normal.  No gallop or rub. No murmur  Abdomen: soft, non-tender. Bowel sounds normal. No hepatosplenomegaly.  Extremities: no cyanosis or edema.  Pulses  intact.  No calf pain  ASSESSMENT and PLAN:    1. Hospital discharge follow-up  Complete    2. Fecal impaction (CMS HCC)  Transitioned lactulose to 30 cc twice a day.    3. Protein-calorie malnutrition, severe (CMS HCC)  Patient's daughter present.  Counseled on high-calorie high-protein diet.  Patient again urged not to worry back cholesterol sugar or the type of food she is eating.  Caloric intake is of utmost importance.    4. Unsteady gait.  Patient high fall risk.  Rollator being prescribed.    medical decision making:moderate    Orders Placed This Encounter    nystatin (MYCOSTATIN) 100,000 unit/gram Cream    lactulose (ENULOSE) 10 gram/15 mL Oral Solution       The patient was given the opportunity to ask questions and those questions were  answered to the patient's satisfaction. The patient was encouraged to call with any additional questions or concerns. Instructed patient to call back if symptoms worse.   Discussed with patient effects and side effects of medications. Medication safety was discussed. A copy of the patient's medication list was printed and given to the patient. A good faith effort was made to reconcile the patient's medications.   Medication reconciliation was performed and medication education was provided.  Education and care coordination was provided.    Education  disease management    Follow up in: 1 month    Advised to call back directly if there are further questions, or if these symptoms fail to improve as anticipated or worsen.    Marylee Floras, MD  INTERNAL MEDICINE, PHYSICIAN OFFICE BUILDING    [address]  [place] [address]  Dept: [phone]  Dept Fax: [phone]

## 2023-02-12 NOTE — Addendum Note (Signed)
Addended by: Burman Riis D on: 02/12/2023 09:51 AM     Modules accepted: Orders

## 2023-02-17 ENCOUNTER — Telehealth: Payer: Self-pay | Admitting: Oncology

## 2023-02-17 NOTE — Telephone Encounter (Signed)
02/17/23 Spoke with patient and rescheduled all appts

## 2023-02-18 ENCOUNTER — Other Ambulatory Visit (HOSPITAL_BASED_OUTPATIENT_CLINIC_OR_DEPARTMENT_OTHER): Payer: Self-pay | Admitting: Internal Medicine

## 2023-02-18 ENCOUNTER — Inpatient Hospital Stay: Payer: Medicare Other | Admitting: Oncology

## 2023-02-18 MED ORDER — NYSTATIN 100,000 UNIT/ML ORAL SUSPENSION
5.0000 mL | Freq: Four times a day (QID) | ORAL | 1 refills | Status: AC
Start: 2023-02-18 — End: 2023-03-10

## 2023-02-18 NOTE — Telephone Encounter (Signed)
The patient has been notified of Dr Angotti's medication instructions.  Medication order placed and sent to Dr Angotti for completion.  Rickie D Harper, MA

## 2023-02-18 NOTE — Telephone Encounter (Signed)
Nystatin 5 cc swish and expectorate 4 times a day for 10 days.  One refill.

## 2023-02-18 NOTE — Telephone Encounter (Signed)
The patient is having some thrush in her mouth and would like a script of Swish and Spit for it sent in.  Danella Deis, [place]

## 2023-02-20 ENCOUNTER — Emergency Department
Admission: EM | Admit: 2023-02-20 | Discharge: 2023-02-20 | Disposition: A | Discharge status: Home / Self Care | Payer: Medicare Other

## 2023-02-20 ENCOUNTER — Inpatient Hospital Stay (HOSPITAL_COMMUNITY)
Admission: RE | Admit: 2023-02-20 | Discharge: 2023-02-20 | Disposition: A | Discharge status: Home / Self Care | Payer: Medicare Other | Source: Ambulatory Visit

## 2023-02-20 ENCOUNTER — Telehealth (HOSPITAL_COMMUNITY): Payer: Self-pay | Admitting: Internal Medicine

## 2023-02-20 ENCOUNTER — Other Ambulatory Visit: Payer: Self-pay

## 2023-02-20 DIAGNOSIS — K5900 Constipation, unspecified: Secondary | ICD-10-CM

## 2023-02-20 DIAGNOSIS — M549 Dorsalgia, unspecified: Secondary | ICD-10-CM | POA: Insufficient documentation

## 2023-02-20 DIAGNOSIS — R350 Frequency of micturition: Secondary | ICD-10-CM | POA: Insufficient documentation

## 2023-02-20 LAB — URINALYSIS, MACRO/MICRO
BILIRUBIN: NEGATIVE mg/dL
BLOOD: NEGATIVE mg/dL
COLOR: NORMAL
GLUCOSE: NORMAL mg/dL
KETONES: 10 mg/dL — AB
LEUKOCYTES: 25 WBCs/uL — AB
NITRITE: NEGATIVE
PH: 5.5 (ref 5.0–8.0)
PROTEIN: NEGATIVE mg/dL
SPECIFIC GRAVITY: 1.015 (ref 1.005–1.030)
UROBILINOGEN: NORMAL mg/dL

## 2023-02-20 LAB — CBC WITH DIFF
BASOPHIL #: <0.1 10*3/uL (ref <=0.20)
BASOPHIL %: 1 %
EOSINOPHIL #: <0.1 10*3/uL (ref <=0.50)
EOSINOPHIL %: 1 %
HCT: 37.6 % (ref 34.8–46.0)
HGB: 12.5 g/dL (ref 11.5–16.0)
IMMATURE GRANULOCYTE #: <0.1 10*3/uL (ref <0.10)
IMMATURE GRANULOCYTE %: 0 % (ref 0.0–1.0)
LYMPHOCYTE #: 1.47 10*3/uL (ref 1.00–4.80)
LYMPHOCYTE %: 27 %
MCH: 29.5 pg (ref 26.0–32.0)
MCHC: 33.2 g/dL (ref 31.0–35.5)
MCV: 88.7 fL (ref 78.0–100.0)
MONOCYTE #: 0.57 10*3/uL (ref 0.20–1.10)
MONOCYTE %: 10 %
MPV: 8.7 fL (ref 8.7–12.5)
NEUTROPHIL #: 3.28 10*3/uL (ref 1.50–7.70)
NEUTROPHIL %: 61 %
PLATELETS: 262 10*3/uL (ref 150–400)
RBC: 4.24 10*6/uL (ref 3.85–5.22)
RDW-CV: 13.7 % (ref 11.5–15.5)
WBC: 5.5 10*3/uL (ref 3.7–11.0)

## 2023-02-20 LAB — HEPATIC FUNCTION PANEL
ALBUMIN: 3.5 g/dL (ref 3.4–4.8)
ALKALINE PHOSPHATASE: 90 U/L (ref 55–145)
ALT (SGPT): 7 U/L — ABNORMAL LOW (ref 8–22)
AST (SGOT): 17 U/L (ref 8–45)
BILIRUBIN DIRECT: 0.3 mg/dL (ref 0.1–0.4)
BILIRUBIN TOTAL: 0.8 mg/dL (ref 0.3–1.3)
PROTEIN TOTAL: 6.6 g/dL (ref 6.0–8.0)

## 2023-02-20 LAB — LIPASE: LIPASE: 55 U/L (ref 10–60)

## 2023-02-20 LAB — BASIC METABOLIC PANEL
ANION GAP: 9 mmol/L (ref 4–13)
BUN/CREA RATIO: 10 (ref 6–22)
BUN: 8 mg/dL (ref 8–25)
CALCIUM: 9.4 mg/dL (ref 8.6–10.3)
CHLORIDE: 96 mmol/L (ref 96–111)
CO2 TOTAL: 27 mmol/L (ref 23–31)
CREATININE: 0.8 mg/dL (ref 0.60–1.05)
ESTIMATED GFR - FEMALE: 72 mL/min/BSA (ref >=60)
GLUCOSE: 96 mg/dL (ref 65–125)
POTASSIUM: 4.5 mmol/L (ref 3.5–5.1)
SODIUM: 132 mmol/L — ABNORMAL LOW (ref 136–145)

## 2023-02-20 MED ORDER — LIDOCAINE HCL 20 MG/ML (2 %) INJECTION SOLUTION
15.0000 mL | INTRAMUSCULAR | Status: DC
Start: 2023-02-20 — End: 2023-02-20

## 2023-02-20 NOTE — ED Provider Notes (Signed)
Department of Emergency Medicine  HPI - 02/20/2023    Advanced Practice Provider: Driscilla Grammes, APRN  Attending Physician: Dr. Claybon Jabs    Chief Complaint: Constipation    HPI  [name] is a 88 y.o. female , accompanied by daughter, who presents to the ED via POV with c/o constipation ongoing 13 days. Per patient and daughter, she was recently admitted here for the same issue from 02/04/23 through 02/05/23 during which she required multiple enemas to have a bowel movement. When she was able to have a bowel movement, she states that they were liquid stools and small dark stools. Since discharge, patient notes that she has been taking lactulose bid but continues to feel constipated. She complains of bloating, flatulence, and lower abdominal firmness. Daughter reports that they called Dr Tonette Bihari office today about her sxs and they were recommended evaluation here. Patient denies having a colonoscopy during her last admission. In regard to her eating habits, she reports that she does not eat much at baseline (maybe 4-5 small meals a day). She endorses urinary frequency and back pain. However, she mentions that she has a hx of back sprain. Patient denies abdominal pain, dysuria, and any other complaints or concerns at this time.      History Limitation: None, patient is HOH    Review of Systems  All systems reviewed and are negative unless otherwise specified in HPI.    History:   PMH:    Past Medical History:   Diagnosis Date    Fibromyalgia     Hypercholesterolemia          PSH:  No past surgical history pertinent negatives on file.      Social Hx:    Social History     Socioeconomic History    Marital status: Widowed     Spouse name: Not on file    Number of children: Not on file    Years of education: Not on file    Highest education level: Not on file   Occupational History    Not on file   Tobacco Use    Smoking status: Never    Smokeless tobacco: Never   Vaping Use    Vaping status: Never Used    Substance and Sexual Activity    Alcohol use: Not on file    Drug use: Never    Sexual activity: Not Currently   Other Topics Concern    Not on file   Social History Narrative    Not on file     Social Determinants of Health     Financial Resource Strain: Not on file   Transportation Needs: Low Risk  (02/04/2023)    Transportation Needs     SDOH Transportation: No   Social Connections: Low Risk  (02/04/2023)    Social Connections     SDOH Social Isolation: 5 or more times a week   Intimate Partner Violence: Not on file   Housing Stability: Low Risk  (02/04/2023)    Housing Stability     SDOH Housing Situation: I have housing.     SDOH Housing Worry: No     Family Hx:   Family History   Problem Relation Age of Onset    Hypertension (High Blood Pressure) Mother     Hypertension (High Blood Pressure) Father      Allergies:   Allergies   Allergen Reactions    Lactose Rash    Grass Pollen      Medications:  Prior to Admission Medications   Prescriptions Last Dose Informant Patient Reported? Taking?   Ibuprofen (MOTRIN) 800 mg Oral Tablet   No No   Sig: Take 1 Tablet (800 mg total) by mouth Three times a day as needed for Pain   lactulose (ENULOSE) 10 gram/15 mL Oral Solution   No No   Sig: Take 30 mL by mouth Twice daily for 360 days   levothyroxine (SYNTHROID) 75 mcg Oral Tablet   No No   Sig: Take 1 Tablet (75 mcg total) by mouth Every morning   nystatin (MYCOSTATIN) 100,000 unit/gram Cream   No No   Sig: Apply topically Twice daily   nystatin (MYCOSTATIN) 100,000 unit/mL Oral Suspension   No No   Sig: Swish and spit 5 mL Four times a day for 20 days      Facility-Administered Medications: None       Above history reviewed with patient, changes are as documented.    Physical Exam  Vitals and nursing note reviewed.   Constitutional:       General: She is not in acute distress.     Appearance: She is not diaphoretic.   HENT:      Head: Normocephalic and atraumatic.      Mouth/Throat:      Mouth: Mucous membranes are  moist.   Eyes:      Pupils: Pupils are equal, round, and reactive to light.   Cardiovascular:      Rate and Rhythm: Normal rate and regular rhythm.      Heart sounds: Normal heart sounds. No murmur heard.  Pulmonary:      Effort: Pulmonary effort is normal. No respiratory distress.      Breath sounds: Normal breath sounds. No stridor. No wheezing or rales.   Abdominal:      General: Bowel sounds are normal. There is no distension.      Tenderness: There is no abdominal tenderness. There is no guarding or rebound.      Comments: Mild firmness to the bilateral lower quadrants.   Musculoskeletal:         General: No tenderness or deformity. Normal range of motion.      Cervical back: Normal range of motion and neck supple.   Skin:     General: Skin is warm and dry.      Findings: No erythema or rash.   Neurological:      Mental Status: She is alert and oriented to person, place, and time.      Cranial Nerves: No cranial nerve deficit.   Psychiatric:         Mood and Affect: Mood normal.         Behavior: Behavior normal.         Course  Orders, Abnormal Labs and Imaging Results:  Results up to the Time the Disposition was Entered   BASIC METABOLIC PANEL - Abnormal; Notable for the following components:       Result Value    SODIUM 132 (*)     All other components within normal limits   HEPATIC FUNCTION PANEL - Abnormal; Notable for the following components:    ALT (SGPT) 7 (*)     All other components within normal limits   URINALYSIS, MACRO/MICRO - Abnormal; Notable for the following components:    LEUKOCYTES 25 (*)     KETONES 10 (*)     WBCS 3-5 (*)     All other components within normal limits  LIPASE - Normal   XR KUB AND UPRIGHT ABDOMEN    Narrative:     Mirah L Marchesi    PROCEDURE DESCRIPTION: XR KUB AND UPRIGHT ABDOMEN    CLINICAL INDICATION: constipation    TECHNIQUE: 2 views / 2 images submitted.    COMPARISON: 02/04/2023      FINDINGS: Nonobstructive bowel gas pattern. Moderate stool burden throughout the  colon. No free air under the diaphragm on upright position. Imaged lungs are clear. 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 Stable mild compression deformity of the T11 inferior endplate.     CBC/DIFF    Narrative:     The following orders were created for panel order CBC/DIFF.  Procedure                               Abnormality         Status                     ---------                               -----------         ------                     CBC WITH ZOXW[[id number]]                                    Final result                 Please view results for these tests on the individual orders.   URINALYSIS WITH REFLEX MICROSCOPIC AND CULTURE IF POSITIVE    Narrative:     The following orders were created for panel order URINALYSIS WITH REFLEX MICROSCOPIC AND CULTURE IF POSITIVE.  Procedure                               Abnormality         Status                     ---------                               -----------         ------                     URINALYSIS, MACRO/MICRO[[id number]]      Abnormal            Final result                 Please view results for these tests on the individual orders.   CBC WITH DIFF   URINE CULTURE,ROUTINE       EKG: Not indicated  MDM:   ED Course as of 02/21/23 0547   Fri Feb 20, 2023   1930 XR KUB AND UPRIGHT ABDOMEN  IMPRESSION:  Moderate stool burden.     2133 Labs are mostly unremarkable at this time. With patient only having moderate constipation. No signs of obstruction or impaction, I discussed with patient to continue taking prescribe lactulose at this time. Bloating and gas  that patient has been experiencing is likely secondary to starting lactulose. I discussed this with patient and daughter. They are agreeable to this plan of care at this time. Provided opportunity for questions. No questions asked at this time.     During the patient's stay in the emergency department, the above listed imaging and/or labs were performed to assist with medical decision  making and were reviewed by myself as available for review.   Patient rechecked and remained stable throughout the emergency department course.     Results discussed with patient.   All questions/concerns addressed, and patient agrees with disposition plan.   Advised that patient return to ED immediately for any new or worsening symptoms; otherwise, follow up with PCP.     Medical Decision Making  See ED Course    Problems Addressed:  Constipation, unspecified constipation type: chronic illness or injury    Amount and/or Complexity of Data Reviewed  Labs: ordered.  Radiology:  Decision-making details documented in ED Course.      Consults: None  Impression:   Clinical Impression   Constipation, unspecified constipation type (Primary)     Disposition:  Discharged  Discharge: Following the above history, physical exam, and studies, the patient was deemed stable and suitable for discharge.  It was advised that the patient return to the ED if they develop new or any other concerning symptoms and follow up as directed.   The patient verbalized understanding of all instructions and had no further questions or concerns.    Follow Up:   Marylee Floras, MD  [address]  [address]  [place] [address]  [phone]      As needed, If symptoms worsen    [facility] - Emergency Department  [address]  [place] [address]  [phone]    As needed, If symptoms worsen    Prescriptions:   Discharge Medication List as of 02/20/2023  9:35 PM           Future Appointments   Date Time Provider Department Center   03/09/2023  9:15 AM Angotti, Inocente Salles, MD [facility]       I am scribing for, and in the presence of, Driscilla Grammes, APRN for services provided on 02/20/2023.  Rosiland Oz, SCRIBE   [place], [place]  02/20/2023, 19:11    I personally performed the services described in this documentation, as scribed  in my presence, and it is both accurate  and  complete.    Hart Robinsons, APRN  Hart Robinsons, APRN      The co-signing faculty was physically present in the emergency department and available for consultation and did not participate in the care of this patient.

## 2023-02-20 NOTE — Telephone Encounter (Signed)
The patients daughter has been notified of Dr Tonette Bihari  recommendation for the patient to go to the ER.  Danella Deis, MA

## 2023-02-20 NOTE — Telephone Encounter (Signed)
Should go back to the emergency department.

## 2023-02-20 NOTE — Telephone Encounter (Signed)
The patients daughter called in stating that the patient  is still constipated. She has been taking the lactulose but it is not helping. Says her mother had an xray in the ER that showed how bad the constipated she is and wants to know if she can get an xray or what to do at this point.  Danella Deis, MA

## 2023-02-20 NOTE — ED Nurses Note (Signed)
Pt assisted to restroom and back.

## 2023-02-20 NOTE — ED Nurses Note (Signed)
Went to give discharge paperwork to daughter and pt. Daughter asking about results. NP mike aware.

## 2023-02-21 ENCOUNTER — Telehealth (HOSPITAL_COMMUNITY): Payer: Self-pay | Admitting: Internal Medicine

## 2023-02-21 NOTE — Telephone Encounter (Signed)
Post Ed Follow-Up    Post ED Follow-Up:   Document completed and/or attempted interactive contact(s) after transition to home after emergency department stay.:   Transition Facility and relevant Date:   Discharge Date: 02/20/23  Discharge from [facility] Emergency Department?: Yes  Discharge Facility: [facility]  Contacted by: Park Liter, BSN, RN  Contact method: Patient/Caregiver Telephone  Contact first attempt: 02/21/2023 11:31 AM  Medications prescribed: No  Left voicemail.

## 2023-02-23 LAB — URINE CULTURE,ROUTINE: URINE CULTURE: NO GROWTH

## 2023-02-24 LAB — CBC AND DIFFERENTIAL
HCT: 38 (ref 36–46)
Hemoglobin: 12.9 (ref 12.0–16.0)
Neutrophils Absolute: 4.33
Platelets: 263 10*3/uL (ref 150–400)
WBC: 7.6

## 2023-02-24 LAB — COMPREHENSIVE METABOLIC PANEL
Albumin: 4.5 (ref 3.5–5.0)
Calcium: 10.3 (ref 8.7–10.7)

## 2023-02-24 LAB — CBC: RBC: 4.08 (ref 3.87–5.11)

## 2023-02-24 LAB — HEPATIC FUNCTION PANEL
ALT: 30 U/L (ref 7–35)
AST: 36 — AB (ref 13–35)
Alkaline Phosphatase: 58 (ref 25–125)
Bilirubin, Total: 0.6

## 2023-02-24 LAB — BASIC METABOLIC PANEL
BUN: 10 (ref 4–21)
CO2: 29 — AB (ref 13–22)
Chloride: 98 — AB (ref 99–108)
Creatinine: 0.7 (ref 0.5–1.1)
Glucose: 85
Potassium: 4.4 mEq/L (ref 3.5–5.1)
Sodium: 134 — AB (ref 137–147)

## 2023-03-02 ENCOUNTER — Encounter (INDEPENDENT_AMBULATORY_CARE_PROVIDER_SITE_OTHER): Payer: Self-pay

## 2023-03-02 ENCOUNTER — Other Ambulatory Visit (INDEPENDENT_AMBULATORY_CARE_PROVIDER_SITE_OTHER): Payer: Self-pay

## 2023-03-02 DIAGNOSIS — Z7189 Other specified counseling: Secondary | ICD-10-CM

## 2023-03-02 NOTE — Nursing Note (Signed)
Care Coordination Identified Note    DATE: 03/02/2023, 10:26  PATIENT NAME: [name]  MRN: [id number]  DOB: 1934-12-02  PCP: Marylee Floras, MD  INSURANCE: Payor: MEDICARE / Plan: MEDICARE PART A AND B / Product Type: Medicare /     The patient has been marked as identified at this time for potential enrollment. Chart review completed.  Pt meets criteria for enrollment with 2 or more chronic conditions and high stratification score  A future outgoing call will be made to patient within 48 hours to discuss Care Coordination and obtain verbal consent if interested.     Mena Goes, RN  03/02/2023  10:26

## 2023-03-03 ENCOUNTER — Other Ambulatory Visit (INDEPENDENT_AMBULATORY_CARE_PROVIDER_SITE_OTHER): Payer: Self-pay

## 2023-03-03 DIAGNOSIS — Z7189 Other specified counseling: Secondary | ICD-10-CM

## 2023-03-03 NOTE — Nursing Note (Signed)
Care Coordination Identified Note    DATE: 03/03/2023, 15:18  PATIENT NAME: [name]  MRN: [id number]  DOB: 1935/01/23  PCP: Marylee Floras, MD  INSURANCE: Payor: MEDICARE / Plan: MEDICARE PART A AND B / Product Type: Medicare /     First call attempt to discuss CCM and possible enrollment.  No answer at number listed and unable to leave voicemail.  Will make 2nd attempt at outreach on 8/23    Mena Goes, RN, BSN  Population Health  Disease Management Coordinator  [phone]

## 2023-03-04 ENCOUNTER — Other Ambulatory Visit (INDEPENDENT_AMBULATORY_CARE_PROVIDER_SITE_OTHER): Payer: Self-pay

## 2023-03-04 DIAGNOSIS — Z7189 Other specified counseling: Secondary | ICD-10-CM

## 2023-03-04 NOTE — Nursing Note (Signed)
POPULATION HEALTH    DISEASE MANAGEMENT COORDINATOR    8/21: Second attempt to discuss enrollment and benefits of CCM program.  No answer at number listed and unable to leave message.  Will send letter via mail to see of interest in CCM. Contact information to be provided in the event the pt would want to enroll.    Mena Goes, RN, BSN  Population Health  Disease Management Coordinator  [phone]

## 2023-03-05 ENCOUNTER — Inpatient Hospital Stay: Payer: Medicare Other | Attending: Oncology | Admitting: Oncology

## 2023-03-05 ENCOUNTER — Encounter: Payer: Self-pay | Admitting: Oncology

## 2023-03-05 VITALS — BP 143/87 | HR 76 | Temp 98.1°F | Resp 18 | Ht 62.0 in | Wt 125.9 lb

## 2023-03-05 DIAGNOSIS — C641 Malignant neoplasm of right kidney, except renal pelvis: Secondary | ICD-10-CM | POA: Diagnosis not present

## 2023-03-05 DIAGNOSIS — Z853 Personal history of malignant neoplasm of breast: Secondary | ICD-10-CM | POA: Diagnosis not present

## 2023-03-05 DIAGNOSIS — D490 Neoplasm of unspecified behavior of digestive system: Secondary | ICD-10-CM

## 2023-03-05 NOTE — Progress Notes (Signed)
[facility]  [address] [place],  [place]  [address] [phone]  Clinic Day: 03/05/23  Referring physician: Olive Bass, MD  CHIEF COMPLAINT:  CC: History of breast cancer  Current Treatment:  Surveillance  HISTORY OF PRESENT ILLNESS:  Linda Sanders is a 88 y.o. 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  She did receive chemotherapy and radiation but this was triple negative and so she is on follow-up but does have multiple other comorbidities including mycobacterium avium intracellulare, polymyositis, multiple pulmonary nodules, and irritable bowel syndrome.  She has repeat CT scans of the chest, abdomen and pelvis on a regular basis, yearly, to follow up on lesions of the lungs, pancreas and kidney.  She has multiple pulmonary nodules which have been present for years and are attributed to her polymyositis.  We have been following a unilocular cystic lesion in the uncinate process of the pancreas for years which has remained relatively stable.  She also has an enhancing lesion in the lower pole of the right kidney measuring 2.4 cm and relatively stable.  In addition to her triple negative breast cancer., she has 3 nieces with breast cancer and a first cousin with breast cancer at age 66.  Her daughter Linda Sanders has had a pancreatic neuroendocrine tumor, with  recurrence.  She also recently had a maternal first cousin with colon cancer who was tested for the colon cancer gene and was negative.  We have therefore recommended genetic testing per NCCN guidelines and that was done last year.  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  She comes in for yearly follow-up and repeat scans and mammogram.  She had surgical fusion for a herniated disc in May of 2017, and still complains of burning of  her lower extremities and intermittent edema.  Her hemoglobin dropped from 13 to 9 postop and she did have significant blood loss during surgery.  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  She also had pneumonia in June of 2017.  We reviewed her medications in detail and she is off the iron supplement, multivitamin and oxycodone, but is now on Lexapro 50 mg daily and vitamin-C 500 mg daily.  She had liver toxicity with Lipitor.  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 Her main complaints today are imbalance, weakness of her lower extremities, cough, dyspnea and occasional low-grade fevers.  She has had some nocturia and incontinence, and she has lost some weight. Her Rheumatologist, Dr. Kathi Sanders, orders her bone density scans and the one in 2018 did show osteoporosis.  She lost her daughter Linda Sanders a few years ago, she was also one of our patients, and died of pancreatic cancer.  Annual bilateral mammogram from July 2021 was clear.  CT chest, abdomen and pelvis from July 2021 was essentially stable with slight interval enlargement of a soft tissue attenuation of the pancreas, and partially exophytic lesion of the posterior midportion of the right kidney measuring 3.5 x 2.3 cm.    INTERVAL HISTORY:  Linda Sanders is here for annual follow up for her history of breast cancer, as well as multiple other findings on CT scans. Patient states that she feels well and has no complaints of pain. She was exposed to COVID this week but is asymptomatic and monitoring  herself for symptoms. She is accompanied by her daughter who informed me she had breast cancer in both breasts since last being here. She had genetic testing done and was negative for BRCA and other gene mutations. Linda Sanders informed me that 3 out of 4 of her daughters have had breast cancer and a sister had lymphoma. Her WBC is 7.6, hemoglobin is 12.9, and platelet count is 263,000 as of 02/24/2023. Her sodium is low but improved  at 134, and her CMP is otherwise normal except for a mildly elevated AST of 36. 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 No evidence of renal vein invasion, lymphadenopathy or metastatic disease in the chest, abdomen, and pelvis. There is new and increased clustered centrilobular nodularity throughout the left lower lobe, consistent with worsened, ongoing atypical infection, particularly mycobacterium. Otherwise unchanged background of bandlike scarring, calcification, and clustered centrilobular and tree-in-bud nodularity throughout the lungs, consistent with chronic sequelae of atypical mycobacterial infection. Linda Sanders also has an unchanged  cystic lesion in the pancreatic uncinate measuring 2.8 x 1.9 cm, consistent with a side branch IPMN. What concerns me is the new nodularity in the left lower lobe. I believe this may be the mycobacterium activating again. She admits to having a productive cough with grey/green phlegm that variates in color and size, she also experiences shortness of breath. I advised she see her pulmonologist for further evaluation.  I will send a copy of her CT report to Dr. Delton [facility] at [facility]. She informed me that she was placed on Remeron and has been taking it for the past 2 weeks. She feels as this doesn't help her sleep much and I advised her to ask her doctor to increase the dosage to 15 mg when she meets with him next week. She has a upcoming mammogram in mid September.  I will see her back in 13 months with CBC, CMP, bilateral mammogram, and CT chest, abdomen, and pelvis.  She denies signs of infection such as sore throat, sinus drainage, or urinary symptoms.  She denies fevers or recurrent chills. She denies pain. She denies nausea, vomiting, chest pain,  dyspnea. Her appetite is good and her weight has decreased 3 pounds over last year . She is accompanied with her youngest daughter Linda Sanders.   REVIEW OF SYSTEMS:  Review of Systems  Constitutional:  Positive for fatigue. Negative for appetite change, chills, diaphoresis, fever and unexpected weight change.  HENT:  Negative.  Negative for hearing loss, lump/mass, mouth sores, nosebleeds, sore throat, tinnitus, trouble swallowing and voice change.   Eyes: Negative.  Negative for eye problems and icterus.  Respiratory:  Positive for cough (productive of grey/green sputum) and shortness of breath (chronic). Negative for chest tightness, hemoptysis and wheezing.   Cardiovascular: Negative.  Negative for chest pain, leg swelling and palpitations.  Gastrointestinal: Negative.  Negative for abdominal distention, abdominal pain, blood in stool, constipation, diarrhea, nausea, rectal pain and vomiting.  Endocrine: Negative.   Genitourinary: Negative.  Negative for bladder incontinence, difficulty urinating, dyspareunia, dysuria, frequency, hematuria, menstrual problem, nocturia, pelvic pain, vaginal bleeding and vaginal discharge.   Musculoskeletal: Negative.  Negative for arthralgias, back pain, flank pain, gait problem, myalgias, neck pain and neck stiffness.  Skin: Negative.  Negative for itching, rash and wound.  Neurological: Negative.  Negative for  dizziness, extremity weakness, gait problem, headaches, light-headedness, numbness, seizures and speech difficulty.  Hematological: Negative.  Negative for adenopathy. Does not bruise/bleed easily.  Psychiatric/Behavioral: Negative.  Negative for confusion, decreased concentration, depression, sleep disturbance and suicidal ideas. The patient is not nervous/anxious.      VITALS:  Blood pressure (!) 143/87, pulse 76, temperature 98.1 F (36.7 C), temperature source Oral, resp. rate 18, height 5\' 2"  (1.575 m), weight 125 lb 14.4 oz (57.1 kg), SpO2 96%.  Wt  Readings from Last 3 Encounters:  03/05/23 125 lb 14.4 oz (57.1 kg)  02/17/22 128 lb (58.1 kg)  02/01/21 126 lb 4.8 oz (57.3 kg)    Body mass index is 23.03 kg/m.  Performance status (ECOG): 0 - Asymptomatic  PHYSICAL EXAM:  Physical Exam Vitals and nursing note reviewed. Exam conducted with a chaperone present.  Constitutional:      General: She is not in acute distress.    Appearance: Normal appearance. She is normal weight. She is not ill-appearing, toxic-appearing or diaphoretic.  HENT:     Head: Normocephalic and atraumatic.     Right Ear: Tympanic membrane, ear canal and external ear normal. There is no impacted cerumen.     Left Ear: Tympanic membrane, ear canal and external ear normal. There is no impacted cerumen.     Nose: Nose normal. No congestion or rhinorrhea.     Mouth/Throat:     Mouth: Mucous membranes are moist.     Pharynx: Oropharynx is clear. No oropharyngeal exudate or posterior oropharyngeal erythema.  Eyes:     General: No scleral icterus.       Right eye: No discharge.        Left eye: No discharge.     Extraocular Movements: Extraocular movements intact.     Conjunctiva/sclera: Conjunctivae normal.     Pupils: Pupils are equal, round, and reactive to light.  Neck:     Vascular: No carotid bruit.     Comments: Prominent bone in the right supraclavicular fossa, chronic. Cardiovascular:     Rate and Rhythm: Normal rate and regular rhythm.     Pulses: Normal pulses.     Heart sounds: Normal heart sounds. No murmur heard.    No friction rub. No gallop.  Pulmonary:     Effort: Pulmonary effort is normal. No respiratory distress.     Breath sounds: No stridor. Examination of the right-lower field reveals rales. Examination of the left-lower field reveals rales. Rales (bilateral, inspiratory) present. No wheezing or rhonchi.     Comments: Bilateral basilar rales Chest:     Chest wall: Tenderness present.     Comments: Bilateral breast are without  masses. Right breast is negative Left breast is considerably smaller, she has a large scar on top of the left breast which is mildly nodular but stable Bilateral breast are without masses.  Abdominal:     General: Bowel sounds are normal. There is no distension.     Palpations: Abdomen is soft. There is no hepatomegaly, splenomegaly or mass.     Tenderness: There is no abdominal tenderness. There is no right CVA tenderness, left CVA tenderness, guarding or rebound.     Hernia: No hernia is present.  Musculoskeletal:        General: No swelling, tenderness, deformity or signs of injury. Normal range of motion.     Cervical back: Normal range of motion and neck supple. No rigidity or tenderness.     Right lower leg: No edema.  Left lower leg: No edema.  Lymphadenopathy:     Cervical: No cervical adenopathy.  Skin:    General: Skin is warm and dry.     Coloration: Skin is not jaundiced or pale.     Findings: No bruising, erythema, lesion or rash.  Neurological:     General: No focal deficit present.     Mental Status: She is alert and oriented to person, place, and time. Mental status is at baseline.     Cranial Nerves: No cranial nerve deficit.     Sensory: No sensory deficit.     Motor: No weakness.     Coordination: Coordination normal.     Gait: Gait normal.     Deep Tendon Reflexes: Reflexes normal.  Psychiatric:        Mood and Affect: Mood normal.        Behavior: Behavior normal.        Thought Content: Thought content normal.        Judgment: Judgment normal.     LABS:      Latest Ref Rng & Units 02/24/2023   12:00 AM 02/13/2022   12:00 AM 12/29/2015    4:50 AM  CBC  WBC  7.6     6.8     9.6   Hemoglobin 12.0 - 16.0 12.9     12.8     10.3   Hematocrit 36 - 46 38     38     33.2   Platelets 150 - 400 K/uL 263     229     267      This result is from an external source.      Latest Ref Rng & Units 02/24/2023   12:00 AM 02/13/2022   12:00 AM 12/29/2015    4:50 AM   CMP  Glucose 65 - 99 mg/dL   89   BUN 4 - 21 10     11     8    Creatinine 0.5 - 1.1 0.7     0.8     0.87   Sodium 137 - 147 134     132     131   Potassium 3.5 - 5.1 mEq/L 4.4     4.6     4.4   Chloride 99 - 108 98     97     99   CO2 13 - 22 29     30     24    Calcium 8.7 - 10.7 10.3     9.5     9.3   Total Protein 6.5 - 8.1 g/dL   5.9   Total Bilirubin 0.3 - 1.2 mg/dL   0.3   Alkaline Phos 25 - 125 58     55     68   AST 13 - 35 36     30     24   ALT 7 - 35 U/L 30     20     23       This result is from an external source.    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  No evidence of renal vein invasion, lymphadenopathy or metastatic disease in the chest, abdomen, and pelvis. New and increased clustered centrilobular nodularity throughout the left lower lobe, consistent with  worsened, ongoing atypical infection, particularly mycobacterium. Otherwise unchanged background of bandlike scarring, calcification, and clustered centrilobular and tree-in-bud nodularity throughout the lungs, consistent with chronic sequelae of atypical mycobacterial infection.  Unchanged cystic lesion in the pancreatic uncinate measuring 2.8 x 1.9cm, consistent with a side branch IPMN. No specific further follow-up or characterization is required for this lesion given other known primary malignancy, well established stability and advanced patient age.  Coronary Artery Disease Unchanged enlargement of the tubular ascending thoracic aorta measuring up to 4.2cm. Recommend annual imaging follow-up by CTA or MRA if clinically appropriate and if not otherwise imaged in this patient with known primary malignancy. This recommendation follows 2010 ACCF/AHA/AATS/ASA/SCA/SCAI/SIR/STS/SVM. Guidelines for the diagnosis and  Management of Patients with Thoracic Aortic Disease. Circulation. 2010; 121: W098-J191. Aortic Aneurysm NOS (ICD10-171.9) Aortic Atherosclerosis (ICD10-170.0)   Allergies:  Allergies  Allergen Reactions   Amoxicillin Other (See Comments)   Amoxicillin-Pot Clavulanate Nausea Only and Other (See Comments)    Other reaction(s): Other (See Comments)  (GI)   Codeine Other (See Comments)    Vomiting   Other reaction(s): GI Upset (intolerance)  (GI)   Clarithromycin Diarrhea, Other (See Comments) and Itching    diarrhea   Avelox [Moxifloxacin Hcl In Nacl]     Upset stomach    Bupropion Other (See Comments)    Other reaction(s): Mental Status Changes (intolerance)  aggitation   Chlorpheniramine     Other reaction(s): Other (See Comments) Other   Clarithromycin     Upset stomach    Cymbalta [Duloxetine Hcl]     Blurred vision    Metronidazole And Related     Rash, itching    Moxifloxacin     Upset stomach    Nitrofurantoin Other (See Comments)   Other Other (See Comments)    Other   Amoxicillin-Pot Clavulanate Nausea And Vomiting   Metronidazole Rash    Rash, itching , 2018: IV and PO no AE   Venlafaxine Other (See Comments)    Current Medications: Current Outpatient Medications  Medication Sig Dispense Refill   mirabegron ER (MYRBETRIQ) 25 MG TB24 tablet TAKE 1 TABLET BY MOUTH EVERY DAY AFTER A MEAL FOR OVERACTIVE BLADDER     mirtazapine (REMERON) 7.5 MG tablet Take by mouth.     SYNTHROID 88 MCG tablet Take by mouth.     acetaminophen (TYLENOL) 325 MG tablet Take 650 mg by mouth every 6 (six) hours as needed for mild pain.      ASPIRIN LOW DOSE 81 MG tablet Take 81 mg by mouth daily.     calcium carbonate (TUMS - DOSED IN MG ELEMENTAL CALCIUM) 500 MG chewable tablet Chew 1 tablet by mouth daily as needed for indigestion or heartburn.     carvedilol (COREG) 12.5 MG tablet Take 25 mg by mouth 2 (two) times daily.      clopidogrel (PLAVIX) 75 MG tablet Take by mouth.      ergocalciferol (VITAMIN D2) 1.25 MG (50000 UT) capsule TAKE 1 CAPSULE ONCE A WEEK     esomeprazole (NEXIUM) 40 MG capsule Take 40 mg by mouth daily.     Eszopiclone 3 MG TABS Take 3 mg by mouth at bedtime as needed.     gabapentin (NEURONTIN) 400 MG capsule Take by mouth.     LACTOBACILLUS RHAMNOSUS, GG, PO Take by mouth.     melatonin (MELATONIN MAXIMUM STRENGTH) 5 MG TABS Take by mouth.     Multiple Vitamin (MULTIVITAMIN) tablet Take 1 tablet by mouth daily.  Omega-3 Fatty Acids (OMEGA-3 FISH OIL PO) Take by mouth.     rosuvastatin (CRESTOR) 40 MG tablet Take 40 mg by mouth daily.     No current facility-administered medications for this visit.     ASSESSMENT & PLAN:  Assessment:   1. Stage IIA triple negative breast cancer now 17 years postop with no evidence of disease.  She was treated with surgery, radiation and chemotherapy.  2. Diagnosis of polymyositis.  3. Multiple pulmonary nodules felt to be secondary to her polymyositis, now with new nodularity left lower lobe.  4. 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 This appears to be flaring on her CT scan with new nodularity of the left lower lobe, so I instructed her to follow-up with her pulmonologist.   5. Stable cystic lesion in the uncinate process of the pancreas, slightly larger over time, consistent with IPMN.  6. Right renal lesion is stable, but could be an indolent renal cell carcinoma or oncocytoma.  In view of its mild and slow enlargement, I see no urgency for cryoablation.  7. Stable lesion in the dome of the liver consistent with hemangioma by MRI scan.    8. Osteoporosis.  9. 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  10.  Hyponatremia, improved.      11.  Recent hospital admission for TIA in July 2023, recovered. She is now on aspirin and Plavix.  12.  Enlargement of the ascending thoracic aorta, stable at 4.2cm as of  02/24/2023.   Plan: We reviewed the results of her CT imaging in detail with her and her daughter and gave her a copy. 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 No evidence of renal vein invasion, lymphadenopathy or metastatic disease in the chest, abdomen, and pelvis. There is new and increased clustered centrilobular nodularity throughout the left lower lobe, consistent with worsened, ongoing atypical infection, particularly mycobacterium. Otherwise unchanged background of bandlike scarring, calcification, and clustered centrilobular and tree-in-bud nodularity throughout the lungs, consistent with chronic sequelae of atypical mycobacterial infection. Iyauna also has an unchanged  cystic lesion in the pancreatic uncinate measuring 2.8 x 1.9 cm, consistent with a side branch IPMN. What concerns me is the new nodularity in the left lower lobe. I believe this may be the mycobacterium activating. She admits to having a productive cough with grey/green phlegm that variates in color and size, she also experiences shortness of breath. I advised she see a pulmonologist for further evaluation.  I will send a copy of her CT report to Dr. Delton Sanders at [facility]. She informed me that she was placed on Remeron and has been taking it for the past 2 weeks. She feels as this doesn't help her sleep much and I advised her to ask her doctor to increase the dosage to 15 mg when she meets with him next week. She has a upcoming mammogram in mid September. I will see her back in 13 months with CBC, CMP, bilateral mammogram, and CT chest, abdomen, and pelvis.  She understands and agrees with this plan of care.    I provided 35 minutes of face-to-face time during this this encounter and > 50% was spent counseling  as documented under my assessment and plan.  Dellia Beckwith, MD [facility] AT [facility] [address] [place] [address] Dept: [phone] Dept Fax: [phone]    Rulon Sera Lassiter,acting as a scribe for Dellia Beckwith, MD.,have documented all relevant documentation on the behalf of Dellia Beckwith, MD,as directed by  Dellia Beckwith, MD while in the presence of Dellia Beckwith, MD.

## 2023-03-06 ENCOUNTER — Encounter: Payer: Self-pay | Admitting: Oncology

## 2023-03-09 ENCOUNTER — Ambulatory Visit (HOSPITAL_BASED_OUTPATIENT_CLINIC_OR_DEPARTMENT_OTHER): Payer: Self-pay | Admitting: Internal Medicine

## 2023-03-10 ENCOUNTER — Other Ambulatory Visit: Payer: Self-pay

## 2023-03-10 ENCOUNTER — Ambulatory Visit (HOSPITAL_COMMUNITY)
Admission: RE | Admit: 2023-03-10 | Discharge: 2023-03-10 | Disposition: A | Discharge status: Home / Self Care | Payer: Medicare Other | Source: Ambulatory Visit | Attending: Internal Medicine | Admitting: Internal Medicine

## 2023-03-10 ENCOUNTER — Encounter (HOSPITAL_BASED_OUTPATIENT_CLINIC_OR_DEPARTMENT_OTHER): Payer: Self-pay | Admitting: Internal Medicine

## 2023-03-10 ENCOUNTER — Ambulatory Visit: Payer: Medicare Other | Attending: Internal Medicine | Admitting: Internal Medicine

## 2023-03-10 VITALS — BP 116/76 | HR 74 | Ht 62.0 in | Wt <= 1120 oz

## 2023-03-10 DIAGNOSIS — R609 Edema, unspecified: Secondary | ICD-10-CM

## 2023-03-10 DIAGNOSIS — E43 Unspecified severe protein-calorie malnutrition: Secondary | ICD-10-CM | POA: Insufficient documentation

## 2023-03-10 DIAGNOSIS — Z0000 Encounter for general adult medical examination without abnormal findings: Secondary | ICD-10-CM | POA: Insufficient documentation

## 2023-03-10 DIAGNOSIS — K5900 Constipation, unspecified: Secondary | ICD-10-CM | POA: Insufficient documentation

## 2023-03-10 LAB — BASIC METABOLIC PANEL, FASTING
ANION GAP: 5 mmol/L (ref 4–13)
BUN/CREA RATIO: 9 (ref 6–22)
BUN: 7 mg/dL — ABNORMAL LOW (ref 8–25)
CALCIUM: 8.9 mg/dL (ref 8.6–10.3)
CHLORIDE: 99 mmol/L (ref 96–111)
CO2 TOTAL: 28 mmol/L (ref 23–31)
CREATININE: 0.77 mg/dL (ref 0.60–1.05)
ESTIMATED GFR - FEMALE: 76 mL/min/BSA (ref >=60)
GLUCOSE: 84 mg/dL (ref 70–99)
POTASSIUM: 4.2 mmol/L (ref 3.5–5.1)
SODIUM: 132 mmol/L — ABNORMAL LOW (ref 136–145)

## 2023-03-10 LAB — URINALYSIS, MACRO/MICRO
BILIRUBIN: NEGATIVE mg/dL
BLOOD: NEGATIVE mg/dL
COLOR: NORMAL
GLUCOSE: NORMAL mg/dL
KETONES: NEGATIVE mg/dL
LEUKOCYTES: 75 WBCs/uL — AB
NITRITE: NEGATIVE
PH: 7 (ref 5.0–8.0)
PROTEIN: NEGATIVE mg/dL
SPECIFIC GRAVITY: 1.012 (ref 1.005–1.030)
UROBILINOGEN: NORMAL mg/dL

## 2023-03-10 NOTE — Progress Notes (Signed)
 Left arm venipuncture   Lovette Cliche, MA

## 2023-03-10 NOTE — H&P (Unsigned)
INTERNAL MEDICINE, PHYSICIAN OFFICE BUILDING  [address]  [name] [name] [address]  Operated by [facility]  Medicare Annual Wellness Visit    Name: [name] MRN:  [id number]   Date: 03/10/2023 Age: 88 y.o.       SUBJECTIVE:   [name] is a 88 y.o. female presenting for a Medicare Wellness exam.   I have reviewed and reconciled the medication list with the patient today.        03/10/2023    10:04 AM 03/05/2022    11:00 AM   Comprehensive Health Assessment-Adult   Do you wish to complete this form? Yes    During the past 4 weeks, how would you rate your health in general? Fair Good   During the past 4 weeks, how much difficulty have you had doing your usual activities inside and outside your home because of medical or emotional problems? A little bit of difficulty No difficulty at all   During the past 4 weeks, was someone available to help you if you needed and wanted help? Yes, as much as I wanted Yes, as much as I wanted   In the past year, how many times have you gone to the emergency department or been admitted to a hospital for a health problem? 2-4 times None   Are you generally satisfied with your sleep? No Yes   Do you have enough money to buy things you need in everyday life, such as food, clothing, medicines, and housing? Yes, always Yes, always   Can you get to places beyond walking distance without help?  (For example, can you drive your own car or travel alone on buses)? No Yes   Do you fasten your seatbelt when you are in a car? Yes, usually Yes, usually   Do you exercise 20 minutes 3 or more days per week (such as walking, dancing, biking, mowing grass, swimming)? No, I usually don't exercise this much Yes, some of the time   How often do you eat food that is healthy (fruits, vegetables, lean meats) instead of unhealthy (sweets, fast food, junk food, fatty foods)? Almost always Most of the time   Have your parents, brothers or sisters had any of the following  problems before the age of 82? (check all that apply) Heart problems, or hardening of the arteries;Diabetes (sugar);Cancer;High cholesterol Heart problems, or hardening of the arteries;Diabetes (sugar);Cancer   How often do you have trouble taking medicines the eay you are told to take them? I always take them as prescribed I always take them as prescribed   Do you need any help communicating with your doctors and nurses because of vision or hearing problems? No    During the past 12 months, have you experienced confusion or memory loss that is happening more often or is getting worse? No    Do you have one person you think of as your personal doctor (primary care provider or family doctor)? Yes Yes   If you are seeing a Primary Care Provider (PCP) or family doctor. please list their name Harrie Foreman M.D. Dr. Particia Lather   Are you now also seeing any specialist physician(s) (such as eye doctor, foot doctor, skin doctor)? No No   How confident are you that you can control or manage most of your health problems? Very confident Somewhat confident       I have reviewed and updated as appropriate the past medical, family and social history. 03/10/2023 as  summarized below:  Past Medical History:   Diagnosis Date    Fibromyalgia     Hypercholesterolemia      History reviewed. No pertinent surgical history.  Current Outpatient Medications   Medication Sig    Ibuprofen (MOTRIN) 800 mg Oral Tablet Take 1 Tablet (800 mg total) by mouth Three times a day as needed for Pain    lactulose (ENULOSE) 10 gram/15 mL Oral Solution Take 30 mL by mouth Twice daily for 360 days    levothyroxine (SYNTHROID) 75 mcg Oral Tablet Take 1 Tablet (75 mcg total) by mouth Every morning    nystatin (MYCOSTATIN) 100,000 unit/gram Cream Apply topically Twice daily (Patient not taking: Reported on 03/10/2023)    nystatin (MYCOSTATIN) 100,000 unit/mL Oral Suspension Swish and spit 5 mL Four times a day for 20 days     Family Medical History:        Problem Relation (Age of Onset)    Hypertension (High Blood Pressure) Mother, Father            Social History     Socioeconomic History    Marital status: Widowed   Tobacco Use    Smoking status: Never    Smokeless tobacco: Never   Vaping Use    Vaping status: Never Used   Substance and Sexual Activity    Drug use: Never    Sexual activity: Not Currently     Social Determinants of Health     Transportation Needs: Low Risk  (02/04/2023)    Transportation Needs     SDOH Transportation: No   Social Connections: Low Risk  (02/04/2023)    Social Connections     SDOH Social Isolation: 5 or more times a week   Housing Stability: Low Risk  (02/04/2023)    Housing Stability     SDOH Housing Situation: I have housing.     SDOH Housing Worry: No   Health Literacy: Medium Risk (02/04/2023)    Health Literacy     SDOH Health Literacy: Sometimes   Employment Status: High Risk (02/04/2023)    Employment Status     SDOH Employment: Unemployed         List of Current Health Care Providers   Care Team       PCP       Name Type Specialty Phone Number    Aamari West, Inocente Salles, MD Physician INTERNAL MEDICINE [phone]              Care Team       No care team found                      Health Maintenance   Topic Date Due    Osteoporosis screening  Never done    Adult Tdap-Td (1 - Tdap) Never done    Shingles Vaccine (1 of 2) Never done    Pneumococcal Vaccination, Age 2+ (1 of 1 - PCV) Never done    Covid-19 Vaccine (4 - 2023-24 season) 03/14/2022    Influenza Vaccine (1) 03/15/2023    Depression Screening  03/09/2024    Medicare Annual Wellness Visit  03/09/2024    Meningococcal Vaccine  Aged Out     Medicare Wellness Assessment   Medicare initial or wellness physical in the last year?: Yes  Advance Directives   Does patient have a living will or MPOA: No           Advance directive information given to the patient today?:  Yes      Activities of Daily Living   Do you need help with dressing, bathing, or walking?: No   Do you need help  with shopping, housekeeping, medications, or finances?: No   Do you have rugs in hallways, broken steps, or poor lighting?: No   Do you have grab bars in your bathroom, non-slip strips in your tub, and hand rails on your stairs?: Yes   Cognitive Function Screen (1=Yes, 0=No)   What is you age?: Correct   What is the time to the nearest hour?: Correct   What is the year?: Correct   What is the name of this clinic?: Correct   Can the patient recognize two persons (the doctor, the nurse, home help, etc.)?: Correct   What is the date of your birth? (day and month sufficient) : Correct   In what year did World War II end?: Correct   Who is the current president of the [place]?: Correct   Count from 20 down to 1?: Correct   What address did I give you earlier?: Correct   Total Score: 10   Interpretation of Total Score: Greater than 6 Normal   Fall Risk Screen   Do you feel unsteady when standing or walking?: Yes  Do you worry about falling?: No  Have you fallen in the past year?: No   Depression Screen     Little interest or pleasure in doing things.: Not at all  Feeling down, depressed, or hopeless: Not at all  PHQ 2 Total: 0     Pain Score        Substance Use-Abuse Screening     Tobacco Use     In Past 12 MONTHS, how often have you used any tobacco product (for example, cigarettes, e-cigarettes, cigars, pipes, or smokeless tobacco)?: Never     Alcohol use     In the PAST 12 MONTHS, how often have you had 5 (men)/4 (women) or more drinks containing alcohol in one day?: Never     Prescription Drug Use     In the PAST 12 months, how often have you used any prescription medications just for the feeling, more than prescribed, or that were not prescribed for you? Prescriptions may include: opioids, benzodiazepines, medications for ADHD: Never           Illicit Drug Use   In the PAST 12 MONTHS, how often have you used any drugs, including marijuana, cocaine or crack, heroin, methamphetamine, hallucinogens,  ecstasy/MDMA?: Never            Urine Incontinence Screen   Urinary Incontinence Screen  Do you ever leak urine when you don't want to?: YES                        REVIEW OF SYSTEMS:   CARDIAC:  No chest pain, DOE, or palpitations. No orthopnea or PND.     PULMONARY:  No cough, sputum, or hemoptysis. No fever,chills, or night sweats.    GI:  No nausea or vomiting. No abdominal pain. No change in bowel habits          No melena or bright red rectal bleeding.    GU :  Voiding without difficulty     NEURO: No headache , diplopia or loss of function of limbs.    OBJECTIVE:   Physical Exam:   Vitals:    03/10/23 0959   BP: 116/76   Pulse: 74  SpO2: 97%   Weight: (!) 31.8 kg (70 lb)   Height: 1.575 m (5\' 2" )   BMI: 12.83         General: No acute/apparent distress, alert and oriented x3, in her normal state, cooperative, appearing stated age.  Neck: Neck supple without lymphadenopathy. No thyroid anomalies noted. Carotids 2 + bilaterally without bruit.  Cardio: No JVD. Regular rate and rhythm. Normal S1 and S2. No murmurs, gallops, or rubs.   Resp: Clear to auscultation bilaterally. No wheezes, rales, rhonchi or crackles. Normal resp effort.  Abd: Soft, non-tender. Bowel sounds present in all 4 quadrants. No hepatosplenomegaly.  Extremities: No cyanosis trace pitting bilateral lower leg edema noted. Pulses intact.  No calf pain.    Health Maintenance Due   Topic Date Due    Osteoporosis screening  Never done    Adult Tdap-Td (1 - Tdap) Never done    Shingles Vaccine (1 of 2) Never done    Pneumococcal Vaccination, Age 2+ (1 of 1 - PCV) Never done    Covid-19 Vaccine (4 - 2023-24 season) 03/14/2022      ASSESSMENT & PLAN:            Assessment/Plan   1. Medicare annual wellness visit, subsequent .  Completed   2. Protein-calorie malnutrition, severe (CMS HCC) patient's weight is up 2 lb.  Encourage nutrition   3. Constipation, unspecified constipation type patient will titrate lactulose from 60 cc daily to 60 cc twice a  day pending need for constipation.  Overall doing better   4. Edema, unspecified type .  Check chest x-ray and basic metabolic panel and decide on medications      Identified Risk Factors/ Recommended Actions     Fall Risk Follow up plan of care: Discussed optimizing home safety  The PHQ 2 Total: 0 depression screen is interpreted as negative.    Urinary Incontinence Plan of Care: Lifestyle modifications  Reassess at follow up visit    Advanced Directives: Advanced Directives: Patient agreeable to -  MPOA discussed, completed, and scanned to chart.         Orders Placed This Encounter    XR CHEST PA AND LATERAL    BASIC METABOLIC PANEL, FASTING          The patient has been educated about risk factors and recommended preventive care. Written Prevention Plan completed/ updated and given to patient (see After Visit Summary).    Return in about 6 months (around 09/10/2023).    Marylee Floras, MD

## 2023-03-11 ENCOUNTER — Other Ambulatory Visit (HOSPITAL_BASED_OUTPATIENT_CLINIC_OR_DEPARTMENT_OTHER): Payer: Self-pay | Admitting: Internal Medicine

## 2023-03-11 MED ORDER — FUROSEMIDE 20 MG TABLET
10.0000 mg | ORAL_TABLET | Freq: Every day | ORAL | 3 refills | Status: DC
Start: 2023-03-11 — End: 2023-04-15

## 2023-03-11 NOTE — Telephone Encounter (Signed)
-----   Message from [name] sent at 03/10/2023 12:37 PM EDT -----  Please call patient and informed that all labs are within acceptable limits.  Chest x-ray unremarkable.  Lasix.  10 mg.  Dispense 30.  One p.o. q.day PRN.  Eleven refills  Haley Floras, MD

## 2023-03-12 LAB — URINE CULTURE,ROUTINE: URINE CULTURE: NO GROWTH

## 2023-04-01 ENCOUNTER — Emergency Department
Admission: EM | Admit: 2023-04-01 | Discharge: 2023-04-01 | Disposition: A | Discharge status: Home / Self Care | Payer: Medicare Other | Attending: Emergency Medicine | Admitting: Emergency Medicine

## 2023-04-01 ENCOUNTER — Other Ambulatory Visit: Payer: Self-pay

## 2023-04-01 ENCOUNTER — Emergency Department (HOSPITAL_COMMUNITY): Payer: Medicare Other

## 2023-04-01 ENCOUNTER — Encounter (HOSPITAL_COMMUNITY): Payer: Self-pay

## 2023-04-01 DIAGNOSIS — K5900 Constipation, unspecified: Secondary | ICD-10-CM | POA: Insufficient documentation

## 2023-04-01 DIAGNOSIS — J90 Pleural effusion, not elsewhere classified: Secondary | ICD-10-CM | POA: Insufficient documentation

## 2023-04-01 DIAGNOSIS — J439 Emphysema, unspecified: Secondary | ICD-10-CM | POA: Insufficient documentation

## 2023-04-01 LAB — HM MAMMOGRAPHY

## 2023-04-01 NOTE — ED Attending Note (Signed)
[facility]  Department of Emergency Medicine    Date: 04/01/2023   Time: 10:45 AM    I was physically present and directly supervised this patient's care.  Patient was seen and examined.  The resident's history and exam were reviewed.  Key elements in addition to and/or correction of that documentation are as follows:    88 year old female presents with complaint of bloating, feels like she might have fluid in her abdomen that is pushing her intestines "up".  Patient called her family doctor who advised her to come to the ER.    No bowel movement for 2 days.  Takes lactulose.    Patient presents with her daughter who helps with history.  No vomiting.      No results found for this visit on 04/01/23 (from the past 720 hour(s)).     Impression:   Clinical Impression   Constipation, unspecified constipation type (Primary)       Plan: Discharged          Raina Mina, MD  04/01/2023, 10:45

## 2023-04-01 NOTE — Discharge Instructions (Signed)
-     Use lactulose for constipation, simethicone (gas-x)  to help with bloating  and gas discomfort.  -   If constipation lasts longer, try enema.

## 2023-04-01 NOTE — ED Provider Notes (Signed)
[facility] - Emergency Department  ED Primary Provider Note  History of Present Illness   Chief Complaint   Patient presents with    Abdominal Pain     Pt states it is more of a bloated feeling than pain. States it has been going on for about a week. Last BM was on Monday of this week.      [name] is a 88 y.o. female who had concerns including Abdominal Pain.  Arrival: The patient arrived by Private Vehicle   88 year old female complaints of bloating, feels like she might have fluid in her abdomen which is pushing her intestine up when she lays on her back.  Per records patient was heavier for complaints of constipation in the past week, patient's daughter reported doing multiple enema, and taking lactulose.  Per daughter she reports that she had no bowel movements for past 2 days.  No fever, chills, nausea, vomiting.          Abdominal Pain  Pain location:  Generalized (worsens when lays on back)  Pain radiates to:  Does not radiate  Pain severity:  Moderate  Timing:  Sporadic  Progression:  Partially resolved  Associated symptoms: cough    Cough  Cough characteristics:  Non-productive  Severity:  Mild    History Reviewed This Encounter:      Physical Exam   ED Triage Vitals   BP (Non-Invasive) 04/01/23 0935 139/69   Heart Rate 04/01/23 0934 86   Respiratory Rate 04/01/23 0934 19   Temperature 04/01/23 0934 36.6 C (97.8 F)   SpO2 04/01/23 0934 97 %   Weight 04/01/23 0932 (!) 31.3 kg (69 lb 0.1 oz)   Height 04/01/23 0932 1.575 m (5' 2.01")     Physical Exam  Vitals reviewed.   Constitutional:       General: She is not in acute distress.     Appearance: She is well-developed.   HENT:      Head: Normocephalic and atraumatic.      Ears:      Comments: Hearing difficulty  Eyes:      Conjunctiva/sclera: Conjunctivae normal.   Cardiovascular:      Rate and Rhythm: Normal rate and regular rhythm.      Heart sounds: No murmur heard.  Pulmonary:      Effort: Pulmonary effort is normal. No  respiratory distress.      Breath sounds: Normal breath sounds.   Abdominal:      Palpations: Abdomen is soft.   Musculoskeletal:         General: No swelling.      Cervical back: Neck supple.   Skin:     General: Skin is warm and dry.   Neurological:      Mental Status: She is alert.   Psychiatric:         Mood and Affect: Mood normal.       Patient Data   Labs Ordered/Reviewed - No data to display  XR ABD FLAT AND UPRIGHT SERIES (W PA CHEST)   Final Result by Edi, Radresults In (09/18 1232)   1. Chronic changes of emphysema/COPD. Small LEFT pleural effusion and probable LEFT base atelectasis are now seen.   2. Nonspecific and nonobstructed bowel gas pattern.   3. Retained stool and gas in nondistended bowel process. Query constipation.         Radiologist location ID: [facility]           Medical Decision Making  Medical Decision Making  Patient has x-ray abdomen was unremarkable, showing retained stool and gas patient was advised to use lactulose, Gas-X to help her with the gas and bloating.  Patient was also advised to use enema if constipation continues     Amount and/or Complexity of Data Reviewed  Radiology: ordered.                     Clinical Impression   Constipation, unspecified constipation type (Primary)       Disposition: Discharged

## 2023-04-01 NOTE — ED Nurses Note (Signed)
Patient discharged home with family.  AVS reviewed with patient/care giver.  A written copy of the AVS and discharge instructions was given to the patient/care giver.  Questions sufficiently answered as needed.  Patient/care giver encouraged to follow up with PCP as indicated.  In the event of an emergency, patient/care giver instructed to call 911 or go to the nearest emergency room.   Ermalinda Memos, RN

## 2023-04-02 ENCOUNTER — Telehealth (HOSPITAL_COMMUNITY): Payer: Self-pay | Admitting: Internal Medicine

## 2023-04-02 NOTE — Telephone Encounter (Signed)
Post Ed Follow-Up    Post ED Follow-Up:   Document completed and/or attempted interactive contact(s) after transition to home after emergency department stay.:   Transition Facility and relevant Date:   Discharge Date: 04/01/23  Discharge from [facility] Emergency Department?: Yes  Discharge Facility: [facility]  Contacted by: Lum Babe, RN  Contact method: Patient/Caregiver Telephone  Contact first attempt: 04/02/2023  3:26 PM

## 2023-04-09 ENCOUNTER — Ambulatory Visit (HOSPITAL_BASED_OUTPATIENT_CLINIC_OR_DEPARTMENT_OTHER): Payer: Self-pay | Admitting: Internal Medicine

## 2023-04-09 ENCOUNTER — Encounter (HOSPITAL_BASED_OUTPATIENT_CLINIC_OR_DEPARTMENT_OTHER): Payer: Self-pay | Admitting: Internal Medicine

## 2023-04-09 ENCOUNTER — Other Ambulatory Visit: Payer: Self-pay

## 2023-04-09 ENCOUNTER — Ambulatory Visit: Payer: Medicare Other | Attending: Internal Medicine | Admitting: Internal Medicine

## 2023-04-09 VITALS — BP 94/64 | HR 84 | Ht 62.0 in | Wt <= 1120 oz

## 2023-04-09 DIAGNOSIS — R141 Gas pain: Secondary | ICD-10-CM | POA: Insufficient documentation

## 2023-04-09 DIAGNOSIS — R194 Change in bowel habit: Secondary | ICD-10-CM | POA: Insufficient documentation

## 2023-04-09 DIAGNOSIS — L89152 Pressure ulcer of sacral region, stage 2: Secondary | ICD-10-CM | POA: Insufficient documentation

## 2023-04-09 MED ORDER — SIMETHICONE 80 MG CHEWABLE TABLET
80.0000 mg | CHEWABLE_TABLET | Freq: Four times a day (QID) | ORAL | 11 refills | Status: DC
Start: 2023-04-09 — End: 2024-04-03

## 2023-04-09 MED ORDER — HONEY 80 % TOPICAL GEL
1.0000 g | Freq: Every day | CUTANEOUS | 5 refills | Status: DC
Start: 2023-04-09 — End: 2023-04-13

## 2023-04-09 NOTE — H&P (Signed)
[facility] - Internal Medicine   [address] [place] [address] [place] [address]  Dept: [phone]  Dept Fax: [phone]    MELL MELLOTT  [birth year]  [id number]    Date of Service: 04/09/2023  1:00 PM EDT    Chief complaint:   Chief Complaint   Patient presents with    Abdominal Pain    Gas    Wound     Tailbone       HPI:     This is a case of a 88 y.o. year old female who comes in today for evaluation.  Gas bloating.  Cramps.  Some increase in baseline constipation.  No melena or bright red rectal bleeding.  Evaluated in the emergency room.  KUB demonstrated stool retention.  Family verify significant gas and bloating.  Patient's parents had difficulty with intestinal problems.  Patient seems to be overly concerned with this.  No nausea vomiting.    ROS:        Current Outpatient Medications   Medication Sig    furosemide (LASIX) 20 mg Oral Tablet Take 0.5 Tablets (10 mg total) by mouth Once a day    Ibuprofen (MOTRIN) 800 mg Oral Tablet Take 1 Tablet (800 mg total) by mouth Three times a day as needed for Pain    lactulose (ENULOSE) 10 gram/15 mL Oral Solution Take 30 mL by mouth Twice daily for 360 days    leptospermum honey (MEDIHONEY) 80 % Gel Apply 1 g topically Once a day    levothyroxine (SYNTHROID) 75 mcg Oral Tablet Take 1 Tablet (75 mcg total) by mouth Every morning    nystatin (MYCOSTATIN) 100,000 unit/gram Cream Apply topically Twice daily    simethicone (MYLICON) 80 mg Oral Tablet, Chewable Chew 1 Tablet (80 mg total) Four times a day for 360 days       Objective:     BP 94/64   Pulse 84   Ht 1.575 m (5\' 2" )   Wt (!) 31.8 kg (70 lb)   LMP  (LMP Unknown)   SpO2 95%   BMI 12.80 kg/m       Lungs: clear to auscultation bilaterally. No crackles or wheeze noted. Normal respiratory effort  Heart: No JVD. Regular rate and rhythm, S1, S2 normal.  No gallop or rub. No murmur  Abdomen: soft, non-tender. Bowel sounds normal. No hepatosplenomegaly.  Tympanitic from gas.  Slight  distention.  Stage II decubitus sacral area  Extremities: no cyanosis or edema.  Pulses intact.  No calf pain    Assessment/Plan     ENCOUNTER DIAGNOSES     ICD-10-CM   1. Abdominal gas pain .  Avoid carbonated beverages.  Patient drinks 1 soda pop daily.  Also Mylicon 80 mg 4 times a day.  If symptoms do not resolve will refer to [facility]. R14.1   2. Change in bowel habits  R19.4   3. Pressure injury of sacral region, stage 2 (CMS HCC) .  Medihoney daily.  Counseled on push nutrition L89.152                     Orders Placed This Encounter    Refer to [facility] Gastroenterology    leptospermum honey (MEDIHONEY) 80 % Gel    simethicone (MYLICON) 80 mg Oral Tablet, Chewable         The patient was given ample opportunity to ask questions and those questions were answered to the patient's satisfaction.  The patient was encouraged to be involved in their own care, and all diagnoses, medications, and medication side-effects were discussed.  A copy of the patient's medication list was printed and given to the patient. A good faith effort was made to reconcile the patient's medications.  The patient was told to contact me with any additional questions or concerns, or go to the ED in an emergency.     Follow up: Return if symptoms worsen or fail to improve.    This note was partially generated using MModal Fluency Direct system, and there may be some incorrect words, spellings, and punctuation that were not noted in checking the note before saving.    Harrie Foreman, M.D.    [facility] - Internal Medicine   [address] [place] [address] [address]  Dept: [phone]  Dept Fax: [phone]

## 2023-04-13 ENCOUNTER — Encounter (HOSPITAL_COMMUNITY): Payer: Self-pay

## 2023-04-13 ENCOUNTER — Encounter (HOSPITAL_COMMUNITY): Payer: Self-pay | Admitting: Internal Medicine

## 2023-04-13 ENCOUNTER — Other Ambulatory Visit: Payer: Self-pay

## 2023-04-13 ENCOUNTER — Observation Stay (HOSPITAL_COMMUNITY): Payer: Medicare Other

## 2023-04-13 ENCOUNTER — Telehealth (HOSPITAL_BASED_OUTPATIENT_CLINIC_OR_DEPARTMENT_OTHER): Payer: Self-pay | Admitting: Internal Medicine

## 2023-04-13 ENCOUNTER — Inpatient Hospital Stay
Admission: AD | Admit: 2023-04-13 | Discharge: 2023-04-15 | DRG: 391 | Disposition: A | Discharge status: Home / Self Care | Payer: Medicare Other | Source: Other Acute Inpatient Hospital | Attending: Internal Medicine | Admitting: Internal Medicine

## 2023-04-13 DIAGNOSIS — R531 Weakness: Secondary | ICD-10-CM | POA: Diagnosis present

## 2023-04-13 DIAGNOSIS — K5900 Constipation, unspecified: Principal | ICD-10-CM | POA: Diagnosis present

## 2023-04-13 DIAGNOSIS — E43 Unspecified severe protein-calorie malnutrition: Secondary | ICD-10-CM | POA: Diagnosis present

## 2023-04-13 DIAGNOSIS — Z79899 Other long term (current) drug therapy: Secondary | ICD-10-CM

## 2023-04-13 DIAGNOSIS — M797 Fibromyalgia: Secondary | ICD-10-CM | POA: Diagnosis present

## 2023-04-13 DIAGNOSIS — Z681 Body mass index (BMI) 19 or less, adult: Secondary | ICD-10-CM

## 2023-04-13 DIAGNOSIS — Z79890 Hormone replacement therapy (postmenopausal): Secondary | ICD-10-CM

## 2023-04-13 HISTORY — DX: Disorder of thyroid, unspecified: E07.9

## 2023-04-13 LAB — COMPREHENSIVE METABOLIC PNL, FASTING
ALBUMIN: 3.6 g/dL (ref 3.4–4.8)
ALKALINE PHOSPHATASE: 89 U/L (ref 55–145)
ALT (SGPT): 8 U/L (ref 8–22)
ANION GAP: 7 mmol/L (ref 4–13)
AST (SGOT): 23 U/L (ref 8–45)
BILIRUBIN TOTAL: 0.9 mg/dL (ref 0.3–1.3)
BUN/CREA RATIO: 17 (ref 6–22)
BUN: 14 mg/dL (ref 8–25)
CALCIUM: 8.8 mg/dL (ref 8.6–10.3)
CHLORIDE: 94 mmol/L — ABNORMAL LOW (ref 96–111)
CO2 TOTAL: 29 mmol/L (ref 23–31)
CREATININE: 0.82 mg/dL (ref 0.60–1.05)
ESTIMATED GFR - FEMALE: 70 mL/min/BSA (ref >=60)
GLUCOSE: 118 mg/dL — ABNORMAL HIGH (ref 70–99)
POTASSIUM: 4 mmol/L (ref 3.5–5.1)
PROTEIN TOTAL: 6.3 g/dL (ref 6.0–8.0)
SODIUM: 130 mmol/L — ABNORMAL LOW (ref 136–145)

## 2023-04-13 LAB — LIPASE: LIPASE: 67 U/L — ABNORMAL HIGH (ref 10–60)

## 2023-04-13 LAB — CBC
HCT: 37.9 % (ref 34.8–46.0)
HGB: 12.5 g/dL (ref 11.5–16.0)
MCH: 30 pg (ref 26.0–32.0)
MCHC: 33 g/dL (ref 31.0–35.5)
MCV: 90.9 fL (ref 78.0–100.0)
MPV: 9.3 fL (ref 8.7–12.5)
PLATELETS: 213 10*3/uL (ref 150–400)
RBC: 4.17 10*6/uL (ref 3.85–5.22)
RDW-CV: 13.9 % (ref 11.5–15.5)
WBC: 5.5 10*3/uL (ref 3.7–11.0)

## 2023-04-13 LAB — MAGNESIUM: MAGNESIUM: 2.3 mg/dL (ref 1.8–2.6)

## 2023-04-13 LAB — C-REACTIVE PROTEIN(CRP),INFLAMMATION: CRP INFLAMMATION: 0.4 mg/L (ref <8.0)

## 2023-04-13 LAB — SEDIMENTATION RATE: ERYTHROCYTE SEDIMENTATION RATE (ESR): 9 mm/h (ref 0–20)

## 2023-04-13 LAB — PHOSPHORUS: PHOSPHORUS: 3.1 mg/dL (ref 2.3–4.0)

## 2023-04-13 MED ORDER — SODIUM CHLORIDE 0.9 % INTRAVENOUS SOLUTION
INTRAVENOUS | Status: DC
Start: 2023-04-13 — End: 2023-04-15

## 2023-04-13 MED ORDER — SODIUM CHLORIDE 0.9 % (FLUSH) INJECTION SYRINGE
3.0000 mL | INJECTION | INTRAMUSCULAR | Status: DC | PRN
Start: 2023-04-13 — End: 2023-04-15

## 2023-04-13 MED ORDER — LEVOTHYROXINE 75 MCG TABLET
75.0000 ug | ORAL_TABLET | Freq: Every morning | ORAL | Status: DC
Start: 2023-04-14 — End: 2023-04-15
  Administered 2023-04-14: 0 ug via ORAL
  Administered 2023-04-15: 75 ug via ORAL
  Filled 2023-04-13: qty 1

## 2023-04-13 MED ORDER — SODIUM CHLORIDE 0.9 % (FLUSH) INJECTION SYRINGE
3.0000 mL | INJECTION | Freq: Three times a day (TID) | INTRAMUSCULAR | Status: DC
Start: 2023-04-13 — End: 2023-04-15
  Administered 2023-04-13 – 2023-04-15 (×7): 0 mL

## 2023-04-13 MED ORDER — ALUMINUM-MAG HYDROXIDE-SIMETHICONE 200 MG-200 MG-20 MG/5 ML ORAL SUSP
20.0000 mL | ORAL | Status: DC | PRN
Start: 2023-04-13 — End: 2023-04-15

## 2023-04-13 MED ORDER — ENOXAPARIN 30 MG/0.3 ML SUBCUTANEOUS SYRINGE
30.0000 mg | INJECTION | SUBCUTANEOUS | Status: DC
Start: 2023-04-13 — End: 2023-04-15
  Administered 2023-04-13 – 2023-04-14 (×2): 30 mg via SUBCUTANEOUS
  Filled 2023-04-13 (×2): qty 0.3

## 2023-04-13 MED ORDER — PANTOPRAZOLE 20 MG TABLET,DELAYED RELEASE
20.0000 mg | DELAYED_RELEASE_TABLET | Freq: Every evening | ORAL | Status: DC
Start: 2023-04-13 — End: 2023-04-15
  Administered 2023-04-13 – 2023-04-14 (×2): 20 mg via ORAL
  Filled 2023-04-13 (×2): qty 1

## 2023-04-13 MED ORDER — SIMETHICONE 80 MG CHEWABLE TABLET
160.0000 mg | CHEWABLE_TABLET | Freq: Four times a day (QID) | ORAL | Status: DC
Start: 2023-04-13 — End: 2023-04-15
  Administered 2023-04-13 – 2023-04-14 (×3): 160 mg via ORAL
  Administered 2023-04-14: 0 mg via ORAL
  Administered 2023-04-14 – 2023-04-15 (×4): 160 mg via ORAL
  Filled 2023-04-13 (×8): qty 2

## 2023-04-13 MED ORDER — ONDANSETRON HCL (PF) 4 MG/2 ML INJECTION SOLUTION
4.0000 mg | Freq: Four times a day (QID) | INTRAMUSCULAR | Status: DC | PRN
Start: 2023-04-13 — End: 2023-04-15
  Administered 2023-04-14: 4 mg via INTRAVENOUS
  Filled 2023-04-13: qty 2

## 2023-04-13 MED ORDER — ACETAMINOPHEN 325 MG TABLET
650.0000 mg | ORAL_TABLET | Freq: Four times a day (QID) | ORAL | Status: DC | PRN
Start: 2023-04-13 — End: 2023-04-15

## 2023-04-13 NOTE — Nurses Notes (Signed)
Patient is alert and oriented. Admitted from home this shift. Pt states she does has gas discomfort; simethicone administered. Pt also has complaints of chronic back pain but declines pain intervention at this time. Pt had xrays completed this shift. Vital signs are stable. IV was placed, running NS @ 4ml/hr. Tolerating clear liquid diet. Currently resting in bed with call light in reach. Bed alarms utilized.    BP (!) 159/76   Pulse 68   Resp 17   Ht 1.575 m (5\' 2" )   Wt (!) 31 kg (68 lb 5.5 oz)   LMP  (LMP Unknown)   SpO2 97%   BMI 12.50 kg/m       I have reviewed this patients plan of care and will continue to monitor.     Lambert Keto, LPN

## 2023-04-13 NOTE — Telephone Encounter (Signed)
Please call [phone].  Clinical coordinator.  Make arrangements for patient to be a direct admission when bed available.  OPO.  Non monitored.  Diagnosis severe constipation

## 2023-04-13 NOTE — Telephone Encounter (Signed)
The bed coordinator has been notified of the patients admission and advises that there is a wait for a bed at this time. The patients daughter has been notified and advised that the coordinator will contact them when a bed becomes available.  Danella Deis, MA

## 2023-04-13 NOTE — Care Plan (Signed)
Currently this patient meets requirements for a low to mid level of nursing care.  The patient will continue to be monitored and re-evaluated for any changes.    Rosina Lowenstein, RN

## 2023-04-13 NOTE — Care Plan (Signed)
Problem: Adult Inpatient Plan of Care  Goal: Plan of Care Review  Outcome: Ongoing (see interventions/notes)  Goal: Patient-Specific Goal (Individualized)  Outcome: Ongoing (see interventions/notes)  Goal: Absence of Hospital-Acquired Illness or Injury  Outcome: Ongoing (see interventions/notes)  Goal: Optimal Comfort and Wellbeing  Outcome: Ongoing (see interventions/notes)  Goal: Rounds/Family Conference  Outcome: Ongoing (see interventions/notes)     Problem: Health Knowledge, Opportunity to Enhance (Adult,Obstetrics,Pediatric)  Goal: Knowledgeable about Health Subject/Topic  Description: Patient will demonstrate the desired outcomes by discharge/transition of care.  Outcome: Ongoing (see interventions/notes)     Problem: Skin Injury Risk Increased  Goal: Skin Health and Integrity  Outcome: Ongoing (see interventions/notes)     Problem: Pain Acute  Goal: Optimal Pain Control and Function  Outcome: Ongoing (see interventions/notes)     Problem: Fall Injury Risk  Goal: Absence of Fall and Fall-Related Injury  Outcome: Ongoing (see interventions/notes)

## 2023-04-13 NOTE — Telephone Encounter (Signed)
The patients daughter called in stating that the patient has tried the medication that you prescribed for her at her appointment last week. She has had no relief and is miserable also reports that her face is starting to swell now. She has been to the ER twice with no relief and she does not want to go back to the ER. Wants to know what to do.  Danella Deis, MA

## 2023-04-13 NOTE — H&P (Signed)
General Medicine  Admission H&P    Date of Service:  04/13/2023  [name], [name], 88 y.o. female  Date of Admission:  04/13/2023  Date of Birth:  1935-05-03  PCP: Marylee Floras, MD    LAY CAREGIVER   Appointed Lay Caregiver?: Yes  Lay Caregiver Name: Dorothyann Peng  Lay Caregiver Relationship to patient: child  Lay Caregiver Contact Number: [phone]     Information Obtained from: patient  Chief Complaint:  Abdominal pain constipation    HPI: [name] is a 88 y.o., White female who presents with abdominal pain and constipation.  It has been in the emergency room on 2 separate occasions in the last several weeks.  Attempts to correct this problem as an outpatient been unsuccessful.  P.o. intake has decreased.  Her BMI is 12.5.  She has been passing flatus but also has a lot of cramping abdominal what she describes as gas pain.  Mylicon has been unsuccessful at low doses.  Taking lactulose and Senokot as also been unsuccessful.  Evaluated and subsequently admitted to the OPO unit.    PAST MEDICAL:    Past Medical History:   Diagnosis Date    Fibromyalgia     Hypercholesterolemia     Thyroid disease         Past Surgical History:   Procedure Laterality Date    HX BREAST LUMPECTOMY Right             Medications Prior to Admission       Prescriptions    furosemide (LASIX) 20 mg Oral Tablet    Take 0.5 Tablets (10 mg total) by mouth Once a day    Patient not taking:  Reported on 04/13/2023    lactulose (ENULOSE) 10 gram/15 mL Oral Solution    Take 30 mL by mouth Twice daily for 360 days    Patient not taking:  Reported on 04/13/2023    levothyroxine (SYNTHROID) 75 mcg Oral Tablet    Take 1 Tablet (75 mcg total) by mouth Every morning    nystatin (MYCOSTATIN) 100,000 unit/mL Oral Suspension    Take 5 mL by mouth Four times a day          Allergies   Allergen Reactions    Lactose Rash    Grass Pollen        Family History  Family Medical History:       Problem Relation (Age of Onset)    Hypertension  (High Blood Pressure) Mother, Father               Social History  Social History     Tobacco Use    Smoking status: Never    Smokeless tobacco: Never   Substance Use Topics    Alcohol use: Never        ROS:  Remainder review of systems was negative were appear to be at baseline and noncontributory    Examination:    Heart Rate: 68 BP (Non-Invasive): (!) 159/76   Respiratory Rate: 17 SpO2: 97 %       Vitals: BP (!) 159/76   Pulse 68   Resp 17   Ht 1.575 m (5\' 2" )   Wt (!) 31 kg (68 lb 5.5 oz)   LMP  (LMP Unknown)   SpO2 97%   BMI 12.50 kg/m       General: no distress  Eyes: Conjunctiva clear., Sclera non-icteric.   Lungs: clear to auscultation bilaterally.   Cardiovascular:  Heart regular rate and rhythm, S1, S2 normal, no murmur, click, rub or gallop and    Vascular   pulses 1+ throughout  Abdomen: soft, non-tender, bowel sounds normal, and non-distended  Extremities: no cyanosis or edema  Skin: Skin warm and dry    Labs:    Will review when available    Imaging Studies:  Will review when available  Results for orders placed or performed during the hospital encounter of 04/13/23 (from the past 24 hour(s))   XR KUB AND UPRIGHT ABDOMEN     Status: None    Narrative    [name]    PROCEDURE DESCRIPTION: XR KUB AND UPRIGHT ABDOMEN.  PROCEDURE DATE AND TIME: 04/13/2023 4:32 PM.  CLINICAL INDICATION: Constipation.  Abdominal pain.  Stool burden.  TECHNIQUE: 2 view(s) / 2 image(s) submitted.  COMPARISON: 02/20/2023.    FINDINGS:   There is abundant stool projecting in the RIGHT hemicolon. Constipation is possible. Some gas projects in nondistended stomach, colon, and small bowel. It is nonspecific. No pneumatosis or pneumoperitoneum is appreciated. No abnormal bowel distention is identified. Scoliosis and osteopenia are again seen. There is redemonstration of degenerative change in spine, sacroiliac joints and hips, pubic symphysis. There are stable old compression deformities of T11 and T12.        Impression    1. Moderate stool retention in the RIGHT hemicolon is consistent with history of constipation.      Radiologist location ID: [id number]         DNR Status:  FULL CODE: ATTEMPT RESUSCITATION/CPR    Assessment/Plan:   Active Hospital Problems    Diagnosis    Constipation     1. Constipation abdominal distention.  Nonacute abdomen.  Flat and upright of the abdomen demonstrates significant amount of stool in the right side of the colon.  Will utilize IV fluids.  Proceed with Gastrografin enema.  Follow abdominal exam in addition to flat and upright abdominal films.      2. Severe protein calorie malnutrition.  Continue to provide counseling on high-calorie high-protein diet as an outpatient.  Once bowels start moving will add food supplements.          Marylee Floras, MD 04/13/2023 19:11

## 2023-04-13 NOTE — Care Plan (Signed)
Problem: Adult Inpatient Plan of Care  Goal: Plan of Care Review  Outcome: Ongoing (see interventions/notes)  Goal: Patient-Specific Goal (Individualized)  Outcome: Ongoing (see interventions/notes)  Goal: Absence of Hospital-Acquired Illness or Injury  Outcome: Ongoing (see interventions/notes)  Goal: Optimal Comfort and Wellbeing  Outcome: Ongoing (see interventions/notes)  Goal: Rounds/Family Conference  Outcome: Ongoing (see interventions/notes)     Problem: Health Knowledge, Opportunity to Enhance (Adult,Obstetrics,Pediatric)  Goal: Knowledgeable about Health Subject/Topic  Description: Patient will demonstrate the desired outcomes by discharge/transition of care.  Outcome: Ongoing (see interventions/notes)     Problem: Skin Injury Risk Increased  Goal: Skin Health and Integrity  Outcome: Ongoing (see interventions/notes)     Problem: Pain Acute  Goal: Optimal Pain Control and Function  Outcome: Ongoing (see interventions/notes)     Problem: Fall Injury Risk  Goal: Absence of Fall and Fall-Related Injury  Outcome: Ongoing (see interventions/notes)     Problem: Constipation  Goal: Effective Bowel Elimination  Outcome: Ongoing (see interventions/notes)     Problem: Fluid Volume Deficit  Goal: Fluid Balance  Outcome: Ongoing (see interventions/notes)

## 2023-04-13 NOTE — Care Plan (Signed)
Problem: Adult Inpatient Plan of Care  Goal: Plan of Care Review  04/13/2023 1937 by Ammie Dalton, RN  Outcome: Ongoing (see interventions/notes)  04/13/2023 1936 by Ammie Dalton, RN  Outcome: Ongoing (see interventions/notes)  Goal: Patient-Specific Goal (Individualized)  04/13/2023 1937 by Ammie Dalton, RN  Outcome: Ongoing (see interventions/notes)  04/13/2023 1936 by Ammie Dalton, RN  Outcome: Ongoing (see interventions/notes)  Goal: Absence of Hospital-Acquired Illness or Injury  04/13/2023 1937 by Ammie Dalton, RN  Outcome: Ongoing (see interventions/notes)  04/13/2023 1936 by Ammie Dalton, RN  Outcome: Ongoing (see interventions/notes)  Goal: Optimal Comfort and Wellbeing  04/13/2023 1937 by Ammie Dalton, RN  Outcome: Ongoing (see interventions/notes)  04/13/2023 1936 by Ammie Dalton, RN  Outcome: Ongoing (see interventions/notes)  Goal: Rounds/Family Conference  04/13/2023 1937 by Ammie Dalton, RN  Outcome: Ongoing (see interventions/notes)  04/13/2023 1936 by Ammie Dalton, RN  Outcome: Ongoing (see interventions/notes)     Problem: Health Knowledge, Opportunity to Enhance (Adult,Obstetrics,Pediatric)  Goal: Knowledgeable about Health Subject/Topic  Description: Patient will demonstrate the desired outcomes by discharge/transition of care.  04/13/2023 1937 by Ammie Dalton, RN  Outcome: Ongoing (see interventions/notes)  04/13/2023 1936 by Ammie Dalton, RN  Outcome: Ongoing (see interventions/notes)     Problem: Skin Injury Risk Increased  Goal: Skin Health and Integrity  04/13/2023 1937 by Ammie Dalton, RN  Outcome: Ongoing (see interventions/notes)  04/13/2023 1936 by Ammie Dalton, RN  Outcome: Ongoing (see interventions/notes)     Problem: Pain Acute  Goal: Optimal Pain Control and Function  04/13/2023 1937 by Ammie Dalton, RN  Outcome: Ongoing (see interventions/notes)  04/13/2023 1936 by Ammie Dalton, RN  Outcome: Ongoing (see interventions/notes)     Problem: Fall Injury Risk  Goal: Absence of Fall and Fall-Related Injury  04/13/2023 1937 by  Ammie Dalton, RN  Outcome: Ongoing (see interventions/notes)  04/13/2023 1936 by Ammie Dalton, RN  Outcome: Ongoing (see interventions/notes)     Problem: Constipation  Goal: Effective Bowel Elimination  04/13/2023 1937 by Ammie Dalton, RN  Outcome: Ongoing (see interventions/notes)  04/13/2023 1936 by Ammie Dalton, RN  Outcome: Ongoing (see interventions/notes)     Problem: Fluid Volume Deficit  Goal: Fluid Balance  04/13/2023 1937 by Ammie Dalton, RN  Outcome: Ongoing (see interventions/notes)  04/13/2023 1936 by Ammie Dalton, RN  Outcome: Ongoing (see interventions/notes)

## 2023-04-14 ENCOUNTER — Observation Stay (HOSPITAL_COMMUNITY): Payer: Medicare Other

## 2023-04-14 ENCOUNTER — Observation Stay (HOSPITAL_COMMUNITY): Payer: Medicare Other | Admitting: Radiology

## 2023-04-14 DIAGNOSIS — Z681 Body mass index (BMI) 19 or less, adult: Secondary | ICD-10-CM

## 2023-04-14 DIAGNOSIS — M6281 Muscle weakness (generalized): Secondary | ICD-10-CM

## 2023-04-14 DIAGNOSIS — R140 Abdominal distension (gaseous): Secondary | ICD-10-CM

## 2023-04-14 DIAGNOSIS — K5900 Constipation, unspecified: Principal | ICD-10-CM

## 2023-04-14 DIAGNOSIS — E43 Unspecified severe protein-calorie malnutrition: Secondary | ICD-10-CM

## 2023-04-14 LAB — BASIC METABOLIC PANEL
ANION GAP: 8 mmol/L (ref 4–13)
BUN/CREA RATIO: 14 (ref 6–22)
BUN: 10 mg/dL (ref 8–25)
CALCIUM: 8.7 mg/dL (ref 8.6–10.3)
CHLORIDE: 96 mmol/L (ref 96–111)
CO2 TOTAL: 26 mmol/L (ref 23–31)
CREATININE: 0.73 mg/dL (ref 0.60–1.05)
ESTIMATED GFR - FEMALE: 80 mL/min/BSA (ref >=60)
GLUCOSE: 74 mg/dL (ref 65–125)
POTASSIUM: 4.1 mmol/L (ref 3.5–5.1)
SODIUM: 130 mmol/L — ABNORMAL LOW (ref 136–145)

## 2023-04-14 LAB — CBC WITH DIFF
BASOPHIL #: <0.1 10*3/uL (ref <=0.20)
BASOPHIL %: 1 %
EOSINOPHIL #: <0.1 10*3/uL (ref <=0.50)
EOSINOPHIL %: 0.4 %
HCT: 38.2 % (ref 34.8–46.0)
HGB: 13.1 g/dL (ref 11.5–16.0)
IMMATURE GRANULOCYTE #: <0.1 10*3/uL (ref <0.10)
IMMATURE GRANULOCYTE %: 0.3 % (ref 0.0–1.0)
LYMPHOCYTE #: 1.64 10*3/uL (ref 1.00–4.80)
LYMPHOCYTE %: 22.7 %
MCH: 30.6 pg (ref 26.0–32.0)
MCHC: 34.3 g/dL (ref 31.0–35.5)
MCV: 89.3 fL (ref 78.0–100.0)
MONOCYTE #: 0.65 10*3/uL (ref 0.20–1.10)
MONOCYTE %: 9 %
MPV: 9.3 fL (ref 8.7–12.5)
NEUTROPHIL #: 4.82 10*3/uL (ref 1.50–7.70)
NEUTROPHIL %: 66.6 %
PLATELETS: 225 10*3/uL (ref 150–400)
RBC: 4.28 10*6/uL (ref 3.85–5.22)
RDW-CV: 14.1 % (ref 11.5–15.5)
WBC: 7.2 10*3/uL (ref 3.7–11.0)

## 2023-04-14 LAB — MAGNESIUM: MAGNESIUM: 2.2 mg/dL (ref 1.8–2.6)

## 2023-04-14 LAB — THYROXINE, FREE (FREE T4): THYROXINE (T4), FREE: 0.73 ng/dL (ref 0.70–1.48)

## 2023-04-14 LAB — PHOSPHORUS: PHOSPHORUS: 3.3 mg/dL (ref 2.3–4.0)

## 2023-04-14 LAB — PT/INR: INR: 1.03 (ref 0.80–1.10)

## 2023-04-14 LAB — THYROID STIMULATING HORMONE (SENSITIVE TSH): TSH: 36.556 u[IU]/mL — ABNORMAL HIGH (ref 0.350–4.940)

## 2023-04-14 MED ORDER — DIATRIZOATE MEGLUMINE-DIATRIZOATE SODIUM 66 %-10 % ORAL SOLUTION
30.0000 mL | ORAL | Status: DC
Start: 2023-04-14 — End: 2023-04-15
  Administered 2023-04-14: 30 mL via ORAL

## 2023-04-14 MED ORDER — LACTULOSE 20 GRAM/30 ML ORAL SOLUTION
30.0000 mL | Freq: Two times a day (BID) | ORAL | Status: DC
Start: 2023-04-14 — End: 2023-04-15
  Administered 2023-04-14: 30 mL via ORAL
  Administered 2023-04-14: 0 mL via ORAL
  Administered 2023-04-15: 30 mL via ORAL
  Filled 2023-04-14 (×3): qty 30

## 2023-04-14 MED ORDER — METOCLOPRAMIDE 5 MG TABLET
5.0000 mg | ORAL_TABLET | Freq: Three times a day (TID) | ORAL | Status: DC
Start: 2023-04-14 — End: 2023-04-15
  Administered 2023-04-14 – 2023-04-15 (×3): 5 mg via ORAL
  Filled 2023-04-14 (×3): qty 1

## 2023-04-14 MED ORDER — LIDOCAINE 4 % TOPICAL PATCH
1.0000 | MEDICATED_PATCH | Freq: Every day | CUTANEOUS | Status: DC
Start: 2023-04-14 — End: 2023-04-15
  Administered 2023-04-14 – 2023-04-15 (×2): 1 via TRANSDERMAL
  Filled 2023-04-14 (×2): qty 1

## 2023-04-14 NOTE — Care Plan (Signed)
Problem: Adult Inpatient Plan of Care  Goal: Plan of Care Review  Outcome: Ongoing (see interventions/notes)  Goal: Patient-Specific Goal (Individualized)  Outcome: Ongoing (see interventions/notes)  Goal: Absence of Hospital-Acquired Illness or Injury  Outcome: Ongoing (see interventions/notes)  Intervention: Identify and Manage Fall Risk  Recent Flowsheet Documentation  Taken 04/13/2023 2200 by [name], GN  Safety Promotion/Fall Prevention: safety round/check completed  Taken 04/13/2023 2050 by [name], GN  Safety Promotion/Fall Prevention: safety round/check completed  Intervention: Prevent Skin Injury  Recent Flowsheet Documentation  Taken 04/13/2023 2050 by [name], GN  Body Position: supine, head elevated  Skin Protection:   adhesive use limited   tubing/devices free from skin contact   transparent dressing maintained  Intervention: Prevent and Manage VTE (Venous Thromboembolism) Risk  Recent Flowsheet Documentation  Taken 04/13/2023 2050 by [name], GN  VTE Prevention/Management: ambulation promoted  Intervention: Prevent Infection  Recent Flowsheet Documentation  Taken 04/13/2023 2200 by Dymond Spreen F, GN  Infection Prevention:   barrier precautions utilized   rest/sleep promoted  Taken 04/13/2023 2050 by Chukwuemeka Artola F, GN  Infection Prevention:   barrier precautions utilized   rest/sleep promoted   personal protective equipment utilized  Goal: Optimal Comfort and Wellbeing  Outcome: Ongoing (see interventions/notes)  Intervention: Provide Person-Centered Care  Recent Flowsheet Documentation  Taken 04/13/2023 2050 by [name], GN  Trust Relationship/Rapport:   care explained   choices provided   reassurance provided   thoughts/feelings acknowledged  Goal: Rounds/Family Conference  Outcome: Ongoing (see interventions/notes)     Problem: Health Knowledge, Opportunity to Enhance (Adult,Obstetrics,Pediatric)  Goal: Knowledgeable about Health Subject/Topic  Description: Patient will demonstrate the desired outcomes by discharge/transition of  care.  Outcome: Ongoing (see interventions/notes)  Intervention: Enhance Health Knowledge  Recent Flowsheet Documentation  Taken 04/13/2023 2050 by [name], GN  Family/Support System Care: self-care encouraged     Problem: Skin Injury Risk Increased  Goal: Skin Health and Integrity  Outcome: Ongoing (see interventions/notes)  Intervention: Optimize Skin Protection  Recent Flowsheet Documentation  Taken 04/13/2023 2050 by [name], GN  Pressure Reduction Techniques: Frequent weight shifting encouraged  Pressure Reduction Devices:   Sacral dressing utilized if not incontinent of bowel   Repositioning wedges/pillows utilized  Skin Protection:   adhesive use limited   tubing/devices free from skin contact   transparent dressing maintained  Activity Management: ambulated to bathroom  Head of Bed (HOB) Positioning: HOB at 60-90 degrees     Problem: Pain Acute  Goal: Optimal Pain Control and Function  Outcome: Ongoing (see interventions/notes)  Intervention: Prevent or Manage Pain  Recent Flowsheet Documentation  Taken 04/13/2023 2050 by [name], GN  Sensory Stimulation Regulation:   care clustered   lighting decreased  Sleep/Rest Enhancement:   awakenings minimized   consistent schedule promoted   room darkened   regular sleep/rest pattern promoted  Medication Review/Management: medications reviewed     Problem: Fall Injury Risk  Goal: Absence of Fall and Fall-Related Injury  Outcome: Ongoing (see interventions/notes)  Intervention: Identify and Manage Contributors  Recent Flowsheet Documentation  Taken 04/13/2023 2050 by [name], GN  Medication Review/Management: medications reviewed  Intervention: Promote Injury-Free Environment  Recent Flowsheet Documentation  Taken 04/13/2023 2200 by [name], GN  Safety Promotion/Fall Prevention: safety round/check completed  Taken 04/13/2023 2050 by [name], GN  Safety Promotion/Fall Prevention: safety round/check completed     Problem: Constipation  Goal: Effective Bowel Elimination  Outcome: Ongoing  (see interventions/notes)     Problem: Fluid  Volume Deficit  Goal: Fluid Balance  Outcome: Ongoing (see interventions/notes)  Intervention: Monitor and Manage Hypovolemia  Recent Flowsheet Documentation  Taken 04/13/2023 2050 by [name], GN  Fluid/Electrolyte Management: fluids provided

## 2023-04-14 NOTE — Care Plan (Signed)
Problem: Adult Inpatient Plan of Care  Goal: Plan of Care Review  Outcome: Ongoing (see interventions/notes)  Goal: Patient-Specific Goal (Individualized)  Outcome: Ongoing (see interventions/notes)  Goal: Absence of Hospital-Acquired Illness or Injury  Outcome: Ongoing (see interventions/notes)  Intervention: Identify and Manage Fall Risk  Recent Flowsheet Documentation  Taken 04/14/2023 1200 by Rozelle Logan, LPN  Safety Promotion/Fall Prevention:   activity supervised   safety round/check completed  Taken 04/14/2023 0800 by Rozelle Logan, LPN  Safety Promotion/Fall Prevention:   activity supervised   safety round/check completed  Intervention: Prevent Skin Injury  Recent Flowsheet Documentation  Taken 04/14/2023 1000 by Rozelle Logan, LPN  Body Position: supine, head elevated  Skin Protection:   adhesive use limited   preventative decubiti skin protection foam dressing applied/intact   protective decubiti skin protection foam dressing not able to be applied (comment required)  Intervention: Prevent and Manage VTE (Venous Thromboembolism) Risk  Recent Flowsheet Documentation  Taken 04/14/2023 1000 by Rozelle Logan, LPN  VTE Prevention/Management: ambulation promoted  Goal: Optimal Comfort and Wellbeing  Outcome: Ongoing (see interventions/notes)  Intervention: Provide Person-Centered Care  Recent Flowsheet Documentation  Taken 04/14/2023 1000 by Rozelle Logan, LPN  Trust Relationship/Rapport:   care explained   thoughts/feelings acknowledged   questions encouraged   questions answered  Goal: Rounds/Family Conference  Outcome: Ongoing (see interventions/notes)     Problem: Health Knowledge, Opportunity to Enhance (Adult,Obstetrics,Pediatric)  Goal: Knowledgeable about Health Subject/Topic  Description: Patient will demonstrate the desired outcomes by discharge/transition of care.  Outcome: Ongoing (see interventions/notes)  Intervention: Enhance Health Knowledge  Recent Flowsheet Documentation  Taken 04/14/2023 1000 by Rozelle Logan,  LPN  Supportive Measures: active listening utilized     Problem: Skin Injury Risk Increased  Goal: Skin Health and Integrity  Outcome: Ongoing (see interventions/notes)  Intervention: Optimize Skin Protection  Recent Flowsheet Documentation  Taken 04/14/2023 1000 by Rozelle Logan, LPN  Pressure Reduction Techniques: Frequent weight shifting encouraged  Pressure Reduction Devices: Sacral dressing utilized if not incontinent of bowel  Skin Protection:   adhesive use limited   preventative decubiti skin protection foam dressing applied/intact   protective decubiti skin protection foam dressing not able to be applied (comment required)  Activity Management: patient refused/activity encouraged  Head of Bed (HOB) Positioning: HOB elevated     Problem: Pain Acute  Goal: Optimal Pain Control and Function  Outcome: Ongoing (see interventions/notes)  Intervention: Prevent or Manage Pain  Recent Flowsheet Documentation  Taken 04/14/2023 1200 by Rozelle Logan, LPN  Sensory Stimulation Regulation: care clustered  Taken 04/14/2023 1000 by Rozelle Logan, LPN  Sensory Stimulation Regulation: care clustered  Sleep/Rest Enhancement: regular sleep/rest pattern promoted  Bowel Elimination Promotion:   ambulation promoted   adequate fluid intake promoted  Intervention: Optimize Psychosocial Wellbeing  Recent Flowsheet Documentation  Taken 04/14/2023 1000 by Rozelle Logan, LPN  Spiritual Activities Assistance: personal rituals encouraged  Supportive Measures: active listening utilized     Problem: Fall Injury Risk  Goal: Absence of Fall and Fall-Related Injury  Outcome: Ongoing (see interventions/notes)  Intervention: Identify and Manage Contributors  Recent Flowsheet Documentation  Taken 04/14/2023 1000 by Rozelle Logan, LPN  Self-Care Promotion:   independence encouraged   BADL personal objects within reach  Intervention: Promote Injury-Free Environment  Recent Flowsheet Documentation  Taken 04/14/2023 1200 by Rozelle Logan, LPN  Safety Promotion/Fall Prevention:    activity supervised   safety round/check completed  Taken 04/14/2023 0800 by Rozelle Logan, LPN  Safety Promotion/Fall Prevention:   activity supervised  safety round/check completed     Problem: Constipation  Goal: Effective Bowel Elimination  Outcome: Ongoing (see interventions/notes)  Intervention: Promote Effective Bowel Elimination  Recent Flowsheet Documentation  Taken 04/14/2023 1000 by Rozelle Logan, LPN  Bowel Function Promotion: prompt response to urge promoted  Bowel Elimination Management: relaxation techniques promoted     Problem: Fluid Volume Deficit  Goal: Fluid Balance  Outcome: Ongoing (see interventions/notes)

## 2023-04-14 NOTE — Care Management Notes (Signed)
[employer] reported pt denied Medicaid. Discussed [employer] w/ pt and family and will begin process for approval.     West Pugh, SOCIAL WORKER

## 2023-04-14 NOTE — Progress Notes (Signed)
Internal Medicine Progress Note    Names:  [name]  Date of service: 04/14/2023  Date of Admission:  04/13/2023  Hospital Day:  LOS: 0 days     Assessment/ Plan:   Active Hospital Problems    Diagnosis    Constipation       1. Constipation with abdominal distention.  Improved after Gastrografin enema.  Discussed results with patient and daughter.  Abdomen still slightly distended.  Will advance diet monitor.  Start lactulose twice a day and Reglan before each meal.  Not sure patient's insurance would provide significant enough coverage to attempt to utilize Linzess.    2. Severe protein calorie malnutrition.  Add ensure supplements.    3. Generalized muscle weakness and deconditioning.  Physical therapy consult.    PT/OT: Yes    Disposition Planning:   Home with home health versus skilled nursing facility    Subjective:  Denies chest pain shortness a breath or palpitations.  Denies dizziness nausea vomiting.  Several bowel movements after Gastrografin enema.  Passing flatus.  Voiding.    acetaminophen (TYLENOL) tablet, 650 mg, Oral, Q6H PRN  aluminum-magnesium hydroxide-simethicone (MAG-AL PLUS) 200-200-20 mg per 5 mL oral liquid, 20 mL, Oral, Q4H PRN  diatrizoate meglumine & sodium oral solution, 30 mL, Oral, Give in Radiology  enoxaparin PF (LOVENOX) 30 mg/0.3 mL SubQ injection, 30 mg, Subcutaneous, Q24H  lactulose (ENULOSE) 20g per 30mL oral liquid, 30 mL, Oral, 2x/day  levothyroxine (SYNTHROID) tablet, 75 mcg, Oral, QAM  metoclopramide (REGLAN) tablet, 5 mg, Oral, 3x/day AC  NS flush syringe, 3 mL, Intracatheter, Q8HRS  NS flush syringe, 3 mL, Intracatheter, Q1H PRN  NS premix infusion, , Intravenous, Continuous  ondansetron (ZOFRAN) 2 mg/mL injection, 4 mg, Intravenous, Q6H PRN  pantoprazole (PROTONIX) delayed release tablet, 20 mg, Oral, NIGHTLY  simethicone (MYLICON) chewable tablet, 160 mg, Oral, 4x/day        Physical Exam:    Vital Signs:  BP 133/74   Pulse 72   Temp 36.7 C (98.1 F)   Resp  16   Ht 1.575 m (5\' 2" )   Wt (!) 31 kg (68 lb 5.5 oz)   LMP  (LMP Unknown)   SpO2 99%   BMI 12.50 kg/m         I/O:  I/O last 24 hours:    Intake/Output Summary (Last 24 hours) at 04/14/2023 1221  Last data filed at 04/14/2023 0600  Gross per 24 hour   Intake 760 ml   Output 950 ml   Net -190 ml     I/O current shift:  No intake/output data recorded.  Blood Sugars:     General: No acute distress, alert and oriented x3  Cardio: Regular rate and rhythm. Normal S1 and S2. No murmurs, gallops, or rubs.   Resp: Clear to auscultation bilaterally. No wheezes, rales, rhonchi or crackles. Normal resp effort.  Abd: Bowel sounds present, Soft, non-tender.  Still some distention noted  Extremities: No edema or swelling noted      Labs:     Results for orders placed or performed during the hospital encounter of 04/13/23 (from the past 24 hour(s))   CBC   Result Value Ref Range    WBC 5.5 3.7 - 11.0 x10^3/uL    RBC 4.17 3.85 - 5.22 x10^6/uL    HGB 12.5 11.5 - 16.0 g/dL    HCT 16.1 09.6 - 04.5 %    MCV 90.9 78.0 - 100.0 fL    MCH 30.0  26.0 - 32.0 pg    MCHC 33.0 31.0 - 35.5 g/dL    RDW-CV 60.4 54.0 - 98.1 %    PLATELETS 213 150 - 400 x10^3/uL    MPV 9.3 8.7 - 12.5 fL   COMPREHENSIVE METABOLIC PNL, FASTING   Result Value Ref Range    SODIUM 130 (L) 136 - 145 mmol/L    POTASSIUM 4.0 3.5 - 5.1 mmol/L    CHLORIDE 94 (L) 96 - 111 mmol/L    CO2 TOTAL 29 23 - 31 mmol/L    ANION GAP 7 4 - 13 mmol/L    BUN 14 8 - 25 mg/dL    CREATININE 1.91 4.78 - 1.05 mg/dL    BUN/CREA RATIO 17 6 - 22    ALBUMIN 3.6 3.4 - 4.8 g/dL     CALCIUM 8.8 8.6 - 29.5 mg/dL    GLUCOSE 621 (H) 70 - 99 mg/dL    ALKALINE PHOSPHATASE 89 55 - 145 U/L    ALT (SGPT) 8 8 - 22 U/L    AST (SGOT)  23 8 - 45 U/L    BILIRUBIN TOTAL 0.9 0.3 - 1.3 mg/dL    PROTEIN TOTAL 6.3 6.0 - 8.0 g/dL    ESTIMATED GFR - FEMALE 70 >=60 mL/min/BSA   MAGNESIUM   Result Value Ref Range    MAGNESIUM 2.3 1.8 - 2.6 mg/dL   PHOSPHORUS   Result Value Ref Range    PHOSPHORUS 3.1 2.3 - 4.0 mg/dL    SEDIMENTATION RATE   Result Value Ref Range    ERYTHROCYTE SEDIMENTATION RATE (ESR) 9 0 - 20 mm/hr   LIPASE   Result Value Ref Range    LIPASE 67 (H) 10 - 60 U/L   C-REACTIVE PROTEIN(CRP),INFLAMMATION   Result Value Ref Range    CRP INFLAMMATION 0.4 <8.0 mg/L   CBC/DIFF    Narrative    The following orders were created for panel order CBC/DIFF.  Procedure                               Abnormality         Status                     ---------                               -----------         ------                     CBC WITH HYQM[[id number]]                                    Final result                 Please view results for these tests on the individual orders.   BASIC METABOLIC PANEL, NON-FASTING   Result Value Ref Range    SODIUM 130 (L) 136 - 145 mmol/L    POTASSIUM 4.1 3.5 - 5.1 mmol/L    CHLORIDE 96 96 - 111 mmol/L    CO2 TOTAL 26 23 - 31 mmol/L    ANION GAP 8 4 - 13 mmol/L    CALCIUM 8.7 8.6 - 10.3 mg/dL    GLUCOSE 74 65 - 528 mg/dL  BUN 10 8 - 25 mg/dL    CREATININE 1.61 0.96 - 1.05 mg/dL    BUN/CREA RATIO 14 6 - 22    ESTIMATED GFR - FEMALE 80 >=60 mL/min/BSA   MAGNESIUM   Result Value Ref Range    MAGNESIUM 2.2 1.8 - 2.6 mg/dL   PHOSPHORUS   Result Value Ref Range    PHOSPHORUS 3.3 2.3 - 4.0 mg/dL   PT/INR   Result Value Ref Range    INR 1.03 0.80 - 1.10    Narrative    Coumadin therapy INR range for Conventional Anticoagulation is 2.0 to 3.0 and for Intensive Anticoagulation 2.5 to 3.5.   THYROID STIMULATING HORMONE (SENSITIVE TSH)   Result Value Ref Range    TSH 36.556 (H) 0.350 - 4.940 uIU/mL   THYROXINE, FREE (FREE T4)   Result Value Ref Range    THYROXINE (T4), FREE 0.73 0.70 - 1.48 ng/dL   CBC WITH DIFF   Result Value Ref Range    WBC 7.2 3.7 - 11.0 x10^3/uL    RBC 4.28 3.85 - 5.22 x10^6/uL    HGB 13.1 11.5 - 16.0 g/dL    HCT 04.5 40.9 - 81.1 %    MCV 89.3 78.0 - 100.0 fL    MCH 30.6 26.0 - 32.0 pg    MCHC 34.3 31.0 - 35.5 g/dL    RDW-CV 91.4 78.2 - 95.6 %    PLATELETS 225 150 - 400 x10^3/uL     MPV 9.3 8.7 - 12.5 fL    NEUTROPHIL % 66.6 %    LYMPHOCYTE % 22.7 %    MONOCYTE % 9.0 %    EOSINOPHIL % 0.4 %    BASOPHIL % 1.0 %    NEUTROPHIL # 4.82 1.50 - 7.70 x10^3/uL    LYMPHOCYTE # 1.64 1.00 - 4.80 x10^3/uL    MONOCYTE # 0.65 0.20 - 1.10 x10^3/uL    EOSINOPHIL # <0.10 <=0.50 x10^3/uL    BASOPHIL # <0.10 <=0.20 x10^3/uL    IMMATURE GRANULOCYTE % 0.3 0.0 - 1.0 %    IMMATURE GRANULOCYTE # <0.10 <0.10 x10^3/uL       Micro: No results found for any visits on 04/13/23 (from the past 96 hour(s)).    Radiology:    Results for orders placed or performed during the hospital encounter of 04/13/23 (from the past 24 hour(s))   XR KUB AND UPRIGHT ABDOMEN     Status: None    Narrative    Angline L Schrum    PROCEDURE DESCRIPTION: XR KUB AND UPRIGHT ABDOMEN.  PROCEDURE DATE AND TIME: 04/13/2023 4:32 PM.  CLINICAL INDICATION: Constipation.  Abdominal pain.  Stool burden.  TECHNIQUE: 2 view(s) / 2 image(s) submitted.  COMPARISON: 02/20/2023.    FINDINGS:   There is abundant stool projecting in the RIGHT hemicolon. Constipation is possible. Some gas projects in nondistended stomach, colon, and small bowel. It is nonspecific. No pneumatosis or pneumoperitoneum is appreciated. No abnormal bowel distention is identified. Scoliosis and osteopenia are again seen. There is redemonstration of degenerative change in spine, sacroiliac joints and hips, pubic symphysis. There are stable old compression deformities of T11 and T12.       Impression    1. Moderate stool retention in the RIGHT hemicolon is consistent with history of constipation.      Radiologist location ID: [id number]     XR KUB AND UPRIGHT ABDOMEN     Status: None    Narrative    Cayleen L Chio  PROCEDURE DESCRIPTION: XR KUB AND UPRIGHT ABDOMEN    CLINICAL INDICATION: Constipation.  Stool burden.    TECHNIQUE: 2 views / 2 images submitted.    COMPARISON: No prior studies were compared.      FINDINGS: The lungs are hyperinflated with associated chronic changes.  Asymmetric left basilar pleural and parenchymal opacities, similar to previous study. Asymmetric right hilar prominence, at least in part related to positioning.    Moderate fecal retention within the right colon. Multiple distended bowel loops, similar to previous study.    Osteopenia.    Degenerative changes and scoliotic curvature within the spine. Stable compression fractures.      Impression    1. Multiple distended colonic loops without evidence for obstruction. Moderate fecal retention right colon.      JJD          Radiologist location ID: [id number]     FLUORO GASTROGRAFIN ENEMA     Status: None    Narrative    Kaliya L Crill    PROCEDURE DESCRIPTION:  FLUORO GASTROGRAFIN ENEMA    TOTAL FLUORO TIME:  46sec    TOTAL PLAIN FILM IMAGES:  0    TOTAL FLUORO IMAGES: 67    CLINICAL INDICATION:  Abdominal distention.  Constipation.    COMPARISON:  No prior studies were compared.      FINDINGS:  Gastrografin enema performed. Contrast flowed relatively easily through the colon with plain defects consistent with retained fecal debris. I do not see any definite suspicious obstructing lesion although limited assessment due to the situation. Patient tolerated well.      Impression    Gastrografin enema as described.                            Radiologist location ID: [id number]         Marylee Floras, MD    04/14/2023   12:21  [facility]  [facility] Medicine

## 2023-04-14 NOTE — Care Management Notes (Signed)
[facility]  Care Management Note    Patient Name: [name]  Date of Birth: 1935/03/22  Sex: female  Date/Time of Admission: 04/13/2023  2:40 PM  Room/Bed: 4135/A  Payor: MEDICARE / Plan: MEDICARE PART A AND B / Product Type: Medicare /    LOS: 0 days   PCP: Marylee Floras, MD    Admitting Diagnosis:  Constipation [K59.00]    Assessment:      04/14/23 0800   Referral Information   Admission Type observation   Observation Form   Observation Form Given MOON form given   MOON form explained/reviewed with:  Patient;verbalized understanding   MOON form given to Patient   MOON form date of delivery 04/13/23   MOON form time of delivery 1605     Visited patient at time of admission.  Explained Observation Status/MOON.  Patient verbalized understanding/signed/dated/timed MOON.    Discharge Plan:        The patient will continue to be evaluated for developing discharge needs.     Case Manager: Farley Ly  Phone: [phone]

## 2023-04-14 NOTE — Care Management Notes (Signed)
[facility]  Care Management Note    Patient Name: [name]  Date of Birth: 1934-10-23  Sex: female  Date/Time of Admission: 04/13/2023  2:40 PM  Room/Bed: 4135/A  Payor: MEDICARE / Plan: MEDICARE PART A AND B / Product Type: Medicare /    LOS: 0 days   PCP: Marylee Floras, MD    Admitting Diagnosis:  Constipation [K59.00]    Assessment:      04/14/23 1400   Medicare Intent to Discharge Documentation   Admit IMM given to: Patient   Admit IMM letter given date 04/14/23   Admit IMM letter time given 1443   IMM explained/reviewed with:  Patient;Patient Representative;verbalized understanding       Visited patient and her daughter, [name].  Explained change in status to Inpatient/IMM.   Patient and her daughter verbalized understanding.  Patient daughter signed/dated/timed IMM.  Provided copy of IMM to patient.    Discharge Plan:        The patient will continue to be evaluated for developing discharge needs.     Case Manager: Farley Ly  Phone: [phone]

## 2023-04-14 NOTE — Nurses Notes (Signed)
Patient alert and oriented x4. OOB x1 assist. No complaints of pain. IV remains patent, and dressing is clean, dry, and intact. NS infusing @50  mL/hr. Allevyn to sacrum remains clean dry and intact. Patient O2 saturations stable on room air. Patient has been NPO since midnight. Patient rested throughout the night. Call bell in reach and plan of care ongoing.      Roselyn Bering, GN

## 2023-04-14 NOTE — Care Plan (Signed)
[facility]  Rehabilitation Services  Physical Therapy Initial Evaluation    Patient Name: [name]  Date of Birth: 14-Jan-1935  Height: Height: 157.5 cm (5\' 2" )  Weight: Weight: (!) 31 kg (68 lb 5.5 oz)  Room/Bed: 4135/A  Payor: MEDICARE / Plan: MEDICARE PART A AND B / Product Type: Medicare /     Assessment:      Pt demonstrated ability to ambulate 346ft using rollator and SBA from PT. Pt completed bed mobility and transfers SBA. Pt reports good family support. Family is present. No concerns with going home. Recommend daily ambulation from staff. will place on restorative. Recommend home with assist.    Discharge Needs:    Equipment Recommendation: four wheeled walker        Discharge Disposition: home with assist    JUSTIFICATION OF DISCHARGE RECOMMENDATION   Based on current diagnosis, functional performance prior to admission, and current functional performance, this patient requires continued PT services in home with assist in order to achieve significant functional improvements in these deficit areas: aerobic capacity/endurance, gait, locomotion, and balance.        Plan:   Current Intervention: balance training, bed mobility training, home exercise program, gait training, neuromuscular re-education, patient/family education, postural re-education, ROM (range of motion), strengthening, stretching, stair training, transfer training  To provide physical therapy services minimum of 2x/week  for duration of until goals are met.    The risks/benefits of therapy have been discussed with the patient/caregiver and he/she is in agreement with the established plan of care.       Subjective & Objective        04/14/23 1518   Therapist Pager   PT Assigned/ Pager # [phone]   Rehab Session   Document Type evaluation   PT Visit Date 04/14/23   Total PT Minutes: 16   Patient Effort good   Symptoms Noted During/After Treatment fatigue   General Information   Patient Profile Reviewed yes   Onset of  Illness/Injury or Date of Surgery 04/13/23   Patient/Family/Caregiver Comments/Observations Pt agreeable to PT   Pertinent History of Current Functional Problem Pt admitted due to severe constipation. PMHx: 70#, fibromyalgia   Medical Lines PIV Line   Respiratory Status room air   Existing Precautions/Restrictions fall precautions   Mutuality/Individual Preferences   Individualized Care Needs FWW/assist x 1   Plan of Care Reviewed With patient   Living Environment   Lives With child(ren), adult   Living Arrangements house   Home Assessment: Stairs in Home   Home Accessibility stairs to enter home   Living Environment Comment needs met 1 level.   Home Main Entrance   Number of Stairs, Main Entrance one   Functional Level Prior   Ambulation 1 - assistive equipment  (rollator)   Transferring 0 - independent   Toileting 0 - independent   Bathing 0 - independent   Dressing 0 - independent   Eating 0 - independent   Communication 0 - understands/communicates without difficulty   Prior Functional Level Comment Pt uses rollator. Pt completd ADLS on her own.   Self-Care   Usual Activity Tolerance good   Current Activity Tolerance moderate   Equipment Currently Used at Home yes   Equipment Currently Used at Home walker, rolling   Pre Treatment Status   Pre Treatment Patient Status Patient supine in bed   Support Present Pre Treatment  None   Cognitive Assessment/Interventions   Behavior/Mood Observations behavior appropriate to situation, WNL/WFL  Orientation Status oriented x 4   Attention WNL/WFL   Follows Commands WFL   Vital Signs   O2 Delivery Post Treatment room air   Pain Assessment   Pretreatment Pain Rating 0/10 - no pain   Posttreatment Pain Rating 0/10 - no pain   RUE Assessment   RUE Assessment WFL for stated baseline   LUE Assessment   LUE Assessment WFL for stated baseline   RLE Assessment   RLE Assessment WFL for stated baseline   LLE Assessment   LLE Assessment WFL for stated baseline   Bed Mobility  Assessment/Treatment   Bed Mobility, Assistive Device Head of Bed Elevated   Supine-Sit Independence stand-by assistance   Safety Issues decreased use of arms for pushing/pulling;decreased use of legs for bridging/pushing;unable to safely maintain weight bearing restrictions   Impairments balance impaired;endurance;ROM decreased   Transfer Assessment/Treatment   Sit-Stand Independence stand-by assistance   Stand-Sit Independence stand-by assistance   Sit-Stand-Sit, Assist Device walker, four wheeled   Gait Assessment/Treatment   Independence  stand-by assistance   Assistive Device  walker, four wheeled   Distance in Feet 353ftx1   Impairments  balance impaired;endurance;strength decreased   Balance   Sitting Balance: Static fair + balance   Sitting, Dynamic (Balance) fair balance   Sit-to-Stand Balance fair balance   Standing Balance: Static fair balance   Standing Balance: Dynamic fair balance   Post Treatment Status   Post Treatment Patient Status Patient sitting in bedside chair or w/c;Call light within reach;Telephone within reach;Patient safety alarm activated   Support Present Post Treatment  Family present   Plan of Care Review   Plan Of Care Reviewed With patient   Physical Therapy Clinical Impression   Assessment Pt demonstrated ability to ambulate 323ft using rollator and SBA from PT. Pt completed bed mobility and transfers SBA. Pt reports good family support. Family is present. No concerns with going home. Recommend daily ambulation from staff. will place on restorative. Recommend home with assist.   Criteria for Skilled Therapeutic yes;meets criteria   Pathology/Pathophysiology Noted musculoskeletal;neuromuscular;cardiovascular;pulmonary   Impairments Found (describe specific impairments) aerobic capacity/endurance;gait, locomotion, and balance   Functional Limitations in Following  self-care   Rehab Potential good   Therapy Frequency minimum of 2x/week   Predicted Duration of Therapy Intervention  (days/wks) until goals are met   Anticipated Equipment Needs at Discharge (PT) four wheeled walker   Anticipated Discharge Disposition home with assist   Evaluation Complexity Justification   Patient History: Co-morbidity/factors that impact Plan of Care 3 or more that impact Plan of Care   Examination Components 3 or more Exam elements addressed   Presentation Evolving: Symptoms, complaints, characteristics of condition changing &/or cognitive deficits present   Clinical Decision Making Moderate complexity   Evaluation Complexity Moderate complexity   Care Plan Goals   PT Rehab Goals Bed Mobility Goal;Gait Training Goal;Stairs Training Goal;Transfer Training Goal   Bed Mobility Goal   Bed Mobility Goal, Date Established 04/14/23   Bed Mobility Goal, Time to Achieve 30 days   Bed Mobility Goal, Activity Type all bed mobility activities   Bed Mobility Goal, Independence Level modified independence   Bed Mobility Goal, Assistive Device least restrictive assistive device   Gait Training  Goal, Distance to Achieve   Gait Training  Goal, Date Established 04/14/23   Gait Training  Goal, Time to Achieve 30 days   Gait Training  Goal, Independence Level modified independence   Gait Training  Goal, Assist Device least restricted  assistive device   Gait Training  Goal, Distance to Achieve 300   Stairs Training Goal   Stairs Training Goal, Date Established 04/14/23   Stairs Training Goal, Time to Achieve 30 days   Stairs Training Goal, Independence Level modified independence   Stairs Training Goal, Assist Device least restrictive assistive device   Stairs Training Goal, Number of Stairs to Achieve 4   Transfer Training Goal   Transfer Training Goal, Date Established 04/14/23   Transfer Training Goal, Time to Achieve 30 days   Transfer Training Goal, Activity Type all transfers   Transfer Training Goal, Independence Level modified independence   Transfer Training Goal, Assist Device least restrictrictive assistive device    Planned Therapy Interventions, PT Eval   Planned Therapy Interventions (PT) balance training;bed mobility training;home exercise program;gait training;neuromuscular re-education;patient/family education;postural re-education;ROM (range of motion);strengthening;stretching;stair training;transfer training       Therapist:   Mora Bellman, PT 04/14/2023 15:31  Pager #: [phone]

## 2023-04-14 NOTE — Care Management Notes (Signed)
[facility]  Care Management Note    Patient Name: [name]  Date of Birth: 1934-07-26  Sex: female  Date/Time of Admission: 04/13/2023  2:40 PM  Room/Bed: 4135/A  Payor: MEDICARE / Plan: MEDICARE PART A AND B / Product Type: Medicare /    LOS: 0 days   PCP: Marylee Floras, MD    Admitting Diagnosis:  Constipation [K59.00]    Assessment:      04/14/23 0949   Assessment Details   Assessment Type Admission   Date of Care Management Update 04/14/23   Readmission   Is this a readmission? No   Insurance Information/Type   Insurance type Medicare   Employment/Financial   Patient has Prescription Coverage?  No    Financial Concerns inadequate insurance coverage   Living Environment   Select an age group to open "lives with" row.  Adult   Lives With child(ren), adult   Living Arrangements house   Able to Return to Prior Arrangements yes   Home Safety   Home Assessment: No Problems Identified   Home Accessibility no concerns   Care Management Plan   Discharge Planning Status initial meeting   Projected Discharge Date 04/16/23   Discharge plan discussed with: Patient;Health Care Surrogate   Discharge Needs Assessment   Equipment Currently Used at Home walker, rolling;bath bench   Equipment Needed After Discharge none   Discharge Facility/Level of Care Needs Home (Patient/Family Member/other)(code 1)   Transportation Available family or friend will provide   Referral Information   Admission Type observation   Address Verified verified-no changes   Arrived From home or self-care     Patient confirmed Name, DOB, Address, Phone Number, Emergency Contact, and Pharmacy on file. Patient's daughter was at bedside and assisted with assessment, Patient is very hard of hearing but able to answer coherently. Patient uses a Product manager and shower chair at home. No home O2, CPAP or BiPAP. Patient lives with adult son at ground level. Patient's daughter comes several days a week to assist. No current HH or in-home services.  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 SW has referred to [facility] for Medicaid eval.     Discharge Plan:  Home (Patient/Family Member/other) (code 1)    The patient will continue to be evaluated for developing discharge needs.     Case Manager: West Pugh, SOCIAL WORKER  Phone: [phone]

## 2023-04-14 NOTE — Ancillary Notes (Signed)
Medical Nutrition Therapy Assessment        SUBJECTIVE:   [name] is an 88 yo female who presents with abdominal pain and constipation. Noted constipation with abdominal distention. PMH includes fibromyalgia, vitamin D deficiency, HLD. Familiar with pt from previous admission. Spoke with pt and family member in room. Pt is currently NPO. Pt was briefly on a clear liquid with with 50% intake during dinner yesterday - tolerated well. Family member reports pt has had abdominal pain but denies nausea or vomiting. No recent significant weight loss over past 90 days per weight chart, but pt is underweight with BMI of 12.5. Family member reports pt "isn't picky" but does not eat large portions. Pt has tried milk-based Ensure which she disliked. Willing to try Ensure Clear and Gelatein supplements once diet advances. 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 Will continue to monitor.    OBJECTIVE:     Current Diet Order/Nutrition Support:  MNT PROTOCOL FOR DIETITIAN  DIET NPO - SPECIFIC DATE & TIME STRICT     Height Used for Calculations: 157.5 cm (5' 2.01")  Weight Used For Calculations: 31 kg (68 lb 5.5 oz)  BMI (kg/m2): 12.52  BMI Assessment: less than 18.5 - underweight  Ideal Body Weight (IBW) (kg): 50.45  % Ideal Body Weight: 61.45           Wt Readings from Last 10 Encounters:   04/13/23 (!) 31 kg (68 lb 5.5 oz)   04/09/23 (!) 31.8 kg (70 lb)   04/01/23 (!) 31.3 kg (69 lb 0.1 oz)   03/10/23 (!) 31.8 kg (70 lb)   02/20/23 (!) 31.3 kg (69 lb 0.1 oz)   02/12/23 (!) 31.2 kg (68 lb 12.8 oz)   02/04/23 (!) 30.7 kg (67 lb 10.9 oz)   01/29/23 (!) 32 kg (70 lb 8.8 oz)   09/08/22 (!) 33.6 kg (74 lb)   03/05/22 (!) 31.8 kg (70 lb)      Comprehensive Metabolic Profile    Lab Results   Component Value Date/Time    SODIUM 130 (L) 04/14/2023 06:57 AM    POTASSIUM 4.1 04/14/2023 06:57 AM    CHLORIDE 96 04/14/2023 06:57 AM    CO2 26 04/14/2023 06:57 AM    ANIONGAP 8 04/14/2023 06:57  AM    BUN 10 04/14/2023 06:57 AM    CREATININE 0.73 04/14/2023 06:57 AM    GLUCOSE 74 04/14/2023 06:57 AM    Lab Results   Component Value Date/Time    CALCIUM 8.7 04/14/2023 06:57 AM    PHOSPHORUS 3.3 04/14/2023 06:57 AM    ALBUMIN 3.6 04/13/2023 05:48 PM    TOTALPROTEIN 6.3 04/13/2023 05:48 PM    ALKPHOS 89 04/13/2023 05:48 PM    AST 23 04/13/2023 05:48 PM    ALT 8 04/13/2023 05:48 PM    TOTBILIRUBIN 0.9 04/13/2023 05:48 PM         Current Facility-Administered Medications   Medication Dose Route Frequency    acetaminophen (TYLENOL) tablet  650 mg Oral Q6H PRN    aluminum-magnesium hydroxide-simethicone (MAG-AL PLUS) 200-200-20 mg per 5 mL oral liquid  20 mL Oral Q4H PRN    diatrizoate meglumine & sodium oral solution  30 mL Oral Give in Radiology    enoxaparin PF (LOVENOX) 30 mg/0.3 mL SubQ injection  30 mg Subcutaneous Q24H    levothyroxine (SYNTHROID) tablet  75 mcg Oral QAM    NS flush syringe  3 mL Intracatheter Q8HRS  NS flush syringe  3 mL Intracatheter Q1H PRN    NS premix infusion   Intravenous Continuous    ondansetron (ZOFRAN) 2 mg/mL injection  4 mg Intravenous Q6H PRN    pantoprazole (PROTONIX) delayed release tablet  20 mg Oral NIGHTLY    simethicone (MYLICON) chewable tablet  160 mg Oral 4x/day      Physical Assessment:   No pressure wounds or edema noted. LBM: 9/25 per pt statement.     A nutrition-focused physical exam was performed.  Results:  Subcutaneous fat loss (3+): orbital 3+, cheek 3+, tricep 3+  Muscle mass loss (3+): temple 3+, clavicle 3+, shoulder 3+    Estimated Needs:  Energy Calorie Requirements: 1050-1175 kcal per day (34-38 kcals/31 kg)  Protein Requirements (gms/day): 43-56 g per day (1.4-1.8g/31 kg)  Fluid Requirements: 1050-1175 ml per day (34-38 mLs/31 kg)    Plan/Interventions:   1. NPO  - Advance diet as medically feasible  2. Recommend trying Ensure Clear and Gelatein supplements as diet advances  - Pt dislikes "milk-based" Ensure products  3. Monitor diet advancement, po  intake, diet tolerance, body weight, lab values    RD available as needed.    Nutrition Diagnosis:   Unspecified severe protein-calorie malnutrition  Related to: inadequate nutrient intake   As evidenced by: Energy Intake: Less than 75% of EER in greater than or equal to 3 months, Body Fat Loss: Severe (Subcutaneous fat loss (3+): orbital 3+, cheek 3+, tricep 3+), Muscle Mass Loss: Severe (Muscle mass loss (3+): temple 3+, clavicle 3+, shoulder 3+), BMI Assessment: less than 18.5 - underweight (BMI: 12.5).      Creola Corn, MS, RD, LD  04/14/2023, 11:43  [facility] Clinical Dietitian  Phone ext [phone]  Ascom ext [phone]

## 2023-04-14 NOTE — Nurses Notes (Signed)
Patient alert and oriented. Free of falls. Denies any complaints of pain. Pt did have an episode of nausea this evening. PRN Zofran was administered. Pt has also had several BMs this shift after gastrografin enema. Vital signs are stable. IV remains patent. NS running @ 72ml/hr. Currently resting in bed with call light in reach.     BP (!) 110/58   Pulse 76   Temp 36.7 C (98.1 F)   Resp 16   Ht 1.575 m (5\' 2" )   Wt (!) 31 kg (68 lb 5.5 oz)   LMP  (LMP Unknown)   SpO2 94%   BMI 12.50 kg/m     I have reviewed this patients plan of care and will continue to monitor.     Lambert Keto, LPN

## 2023-04-15 ENCOUNTER — Inpatient Hospital Stay (HOSPITAL_COMMUNITY): Payer: Medicare Other

## 2023-04-15 ENCOUNTER — Other Ambulatory Visit: Payer: Self-pay

## 2023-04-15 LAB — CBC
HCT: 35.1 % (ref 34.8–46.0)
HGB: 11.6 g/dL (ref 11.5–16.0)
MCH: 30.1 pg (ref 26.0–32.0)
MCHC: 33 g/dL (ref 31.0–35.5)
MCV: 91.2 fL (ref 78.0–100.0)
MPV: 9.2 fL (ref 8.7–12.5)
PLATELETS: 184 10*3/uL (ref 150–400)
RBC: 3.85 10*6/uL (ref 3.85–5.22)
RDW-CV: 14.3 % (ref 11.5–15.5)
WBC: 8.9 10*3/uL (ref 3.7–11.0)

## 2023-04-15 LAB — BASIC METABOLIC PANEL
ANION GAP: 8 mmol/L (ref 4–13)
BUN/CREA RATIO: 13 (ref 6–22)
BUN: 10 mg/dL (ref 8–25)
CALCIUM: 8.3 mg/dL — ABNORMAL LOW (ref 8.6–10.3)
CHLORIDE: 101 mmol/L (ref 96–111)
CO2 TOTAL: 24 mmol/L (ref 23–31)
CREATININE: 0.77 mg/dL (ref 0.60–1.05)
ESTIMATED GFR - FEMALE: 75 mL/min/BSA (ref >=60)
GLUCOSE: 79 mg/dL (ref 65–125)
POTASSIUM: 3.2 mmol/L — ABNORMAL LOW (ref 3.5–5.1)
SODIUM: 133 mmol/L — ABNORMAL LOW (ref 136–145)

## 2023-04-15 LAB — MAGNESIUM: MAGNESIUM: 2.1 mg/dL (ref 1.8–2.6)

## 2023-04-15 LAB — PHOSPHORUS: PHOSPHORUS: 3.8 mg/dL (ref 2.3–4.0)

## 2023-04-15 MED ORDER — SIMETHICONE 80 MG CHEWABLE TABLET
160.0000 mg | CHEWABLE_TABLET | Freq: Four times a day (QID) | ORAL | 11 refills | Status: AC
Start: 2023-04-15 — End: 2024-04-09
  Filled 2023-04-15: qty 100, 13d supply, fill #0
  Filled 2023-04-27: qty 100, 13d supply, fill #1
  Filled 2023-07-13: qty 100, 13d supply, fill #2

## 2023-04-15 MED ORDER — LACTULOSE 10 GRAM/15 ML ORAL SOLUTION
30.0000 mL | Freq: Two times a day (BID) | ORAL | 11 refills | Status: DC
Start: 2023-04-15 — End: 2023-07-02
  Filled 2023-04-15: qty 946, 16d supply, fill #0

## 2023-04-15 MED ORDER — METOCLOPRAMIDE 5 MG TABLET
5.0000 mg | ORAL_TABLET | Freq: Three times a day (TID) | ORAL | 11 refills | Status: AC
Start: 2023-04-15 — End: 2024-04-09
  Filled 2023-04-15: qty 90, 30d supply, fill #0
  Filled 2023-07-13: qty 90, 30d supply, fill #1

## 2023-04-15 MED ORDER — LIDOCAINE 4 % TOPICAL PATCH
1.0000 | MEDICATED_PATCH | Freq: Every day | CUTANEOUS | 11 refills | Status: DC
Start: 2023-04-16 — End: 2023-09-24
  Filled 2023-04-15: qty 30, 30d supply, fill #0

## 2023-04-15 MED ORDER — POTASSIUM CHLORIDE ER 10 MEQ TABLET,EXTENDED RELEASE
10.0000 meq | ORAL_TABLET | Freq: Once | ORAL | Status: AC
Start: 2023-04-15 — End: 2023-04-15
  Administered 2023-04-15: 10 meq via ORAL
  Filled 2023-04-15: qty 1

## 2023-04-15 NOTE — Care Plan (Signed)
Patient is alert and oriented.  Patient unable to chew and simethicone had to be crushed for patient to take; swallowed small pills fine with water.  Vital signs stable.  Patient assisted to bedside commode with x1 assistance.  Bed alarm utilized.  Abdomen remains somewhat distended on right side.  Patient reported several episodes of diarrhea through evening and irritation at rectum; refused evening lactulose but stated would take morning dose to keep bowel movements regular.  IV fluids maintained and tolerated well.  Foam dressing intact to sacrum.  I have reviewed this patient's orders and plan of care.  Currently this patient meets requirements for a low to mid level of nursing care.  Francee Gentile, RN    Problem: Adult Inpatient Plan of Care  Goal: Plan of Care Review  Outcome: Ongoing (see interventions/notes)  Goal: Patient-Specific Goal (Individualized)  Outcome: Ongoing (see interventions/notes)  Goal: Absence of Hospital-Acquired Illness or Injury  Outcome: Ongoing (see interventions/notes)  Intervention: Identify and Manage Fall Risk  Recent Flowsheet Documentation  Taken 04/15/2023 0200 by Jonathon Resides, RN  Safety Promotion/Fall Prevention:   safety round/check completed   fall prevention program maintained  Taken 04/15/2023 0000 by Jonathon Resides, RN  Safety Promotion/Fall Prevention:   safety round/check completed   fall prevention program maintained  Taken 04/14/2023 2228 by Jonathon Resides, RN  Safety Promotion/Fall Prevention:   safety round/check completed   fall prevention program maintained   activity supervised  Taken 04/14/2023 2200 by Jonathon Resides, RN  Safety Promotion/Fall Prevention:   safety round/check completed   fall prevention program maintained   activity supervised  Taken 04/14/2023 2026 by Jonathon Resides, RN  Safety Promotion/Fall Prevention:   safety round/check completed   fall prevention program maintained   activity supervised  Taken 04/14/2023 1943 by Jonathon Resides, RN  Safety Promotion/Fall Prevention:   safety round/check  completed   fall prevention program maintained   activity supervised  Intervention: Prevent Skin Injury  Recent Flowsheet Documentation  Taken 04/15/2023 0200 by Jonathon Resides, RN  Body Position: side lying, left  Taken 04/15/2023 0000 by Jonathon Resides, RN  Body Position: supine, head elevated  Taken 04/14/2023 2228 by Jonathon Resides, RN  Body Position: supine, head elevated  Skin Protection:   adhesive use limited   incontinence pads utilized   transparent dressing maintained   tubing/devices free from skin contact  Taken 04/14/2023 2200 by Jonathon Resides, RN  Body Position: supine, head elevated  Taken 04/14/2023 2026 by Jonathon Resides, RN  Body Position: supine, head elevated  Taken 04/14/2023 1943 by Jonathon Resides, RN  Body Position: supine, head elevated  Intervention: Prevent and Manage VTE (Venous Thromboembolism) Risk  Recent Flowsheet Documentation  Taken 04/14/2023 2228 by Jonathon Resides, RN  VTE Prevention/Management: (lovenox)   ambulation promoted   anticoagulant therapy maintained  Goal: Optimal Comfort and Wellbeing  Outcome: Ongoing (see interventions/notes)  Intervention: Provide Person-Centered Care  Recent Flowsheet Documentation  Taken 04/14/2023 2228 by Jonathon Resides, RN  Trust Relationship/Rapport:   care explained   choices provided   questions encouraged   thoughts/feelings acknowledged  Goal: Rounds/Family Conference  Outcome: Ongoing (see interventions/notes)     Problem: Health Knowledge, Opportunity to Enhance (Adult,Obstetrics,Pediatric)  Goal: Knowledgeable about Health Subject/Topic  Description: Patient will demonstrate the desired outcomes by discharge/transition of care.  Outcome: Ongoing (see interventions/notes)  Intervention: Enhance Health Knowledge  Recent Flowsheet Documentation  Taken 04/14/2023 2228 by Jonathon Resides, RN  Family/Support System Care:   self-care encouraged   support  provided  Supportive Measures:   active listening utilized   positive reinforcement provided   self-care encouraged   verbalization of feelings encouraged     Problem:  Skin Injury Risk Increased  Goal: Skin Health and Integrity  Outcome: Ongoing (see interventions/notes)  Intervention: Optimize Skin Protection  Recent Flowsheet Documentation  Taken 04/15/2023 0200 by Jonathon Resides, RN  Activity Management: up ad lib  Head of Bed [facility]) Positioning: HOB at 30-45 degrees  Taken 04/15/2023 0000 by Jonathon Resides, RN  Activity Management: up ad lib  Head of Bed [facility]) Positioning: HOB at 30-45 degrees  Taken 04/14/2023 2228 by Jonathon Resides, RN  Skin Protection:   adhesive use limited   incontinence pads utilized   transparent dressing maintained   tubing/devices free from skin contact  Activity Management: up ad lib  Head of Bed [facility]) Positioning: HOB at 30-45 degrees  Taken 04/14/2023 2200 by Jonathon Resides, RN  Activity Management: up ad lib  Head of Bed [facility]) Positioning: HOB at 30-45 degrees  Taken 04/14/2023 2026 by Jonathon Resides, RN  Activity Management: up ad lib  Head of Bed [facility]) Positioning: HOB at 45 degrees  Taken 04/14/2023 1943 by Jonathon Resides, RN  Activity Management: up ad lib  Head of Bed (HOB) Positioning: HOB at 45 degrees     Problem: Pain Acute  Goal: Optimal Pain Control and Function  Outcome: Ongoing (see interventions/notes)  Intervention: Prevent or Manage Pain  Recent Flowsheet Documentation  Taken 04/14/2023 2228 by Jonathon Resides, RN  Sleep/Rest Enhancement:   awakenings minimized   consistent schedule promoted   regular sleep/rest pattern promoted  Intervention: Optimize Psychosocial Wellbeing  Recent Flowsheet Documentation  Taken 04/14/2023 2228 by Jonathon Resides, RN  Supportive Measures:   active listening utilized   positive reinforcement provided   self-care encouraged   verbalization of feelings encouraged     Problem: Fall Injury Risk  Goal: Absence of Fall and Fall-Related Injury  Outcome: Ongoing (see interventions/notes)  Intervention: Identify and Manage Contributors  Recent Flowsheet Documentation  Taken 04/14/2023 2228 by Jonathon Resides, RN  Self-Care Promotion:   BADL personal objects within reach   BADL personal  routines maintained   adaptive equipment use encouraged  Intervention: Promote Injury-Free Environment  Recent Flowsheet Documentation  Taken 04/15/2023 0200 by Jonathon Resides, RN  Safety Promotion/Fall Prevention:   safety round/check completed   fall prevention program maintained  Taken 04/15/2023 0000 by Jonathon Resides, RN  Safety Promotion/Fall Prevention:   safety round/check completed   fall prevention program maintained  Taken 04/14/2023 2228 by Jonathon Resides, RN  Safety Promotion/Fall Prevention:   safety round/check completed   fall prevention program maintained   activity supervised  Taken 04/14/2023 2200 by Jonathon Resides, RN  Safety Promotion/Fall Prevention:   safety round/check completed   fall prevention program maintained   activity supervised  Taken 04/14/2023 2026 by Jonathon Resides, RN  Safety Promotion/Fall Prevention:   safety round/check completed   fall prevention program maintained   activity supervised  Taken 04/14/2023 1943 by Jonathon Resides, RN  Safety Promotion/Fall Prevention:   safety round/check completed   fall prevention program maintained   activity supervised     Problem: Constipation  Goal: Effective Bowel Elimination  Outcome: Ongoing (see interventions/notes)     Problem: Fluid Volume Deficit  Goal: Fluid Balance  Outcome: Ongoing (see interventions/notes)     Problem: Health Knowledge, Opportunity to Enhance (Adult,Obstetrics,Pediatric)  Intervention: Enhance Health Knowledge  Recent Flowsheet Documentation  Taken 04/14/2023 2228  by Jonathon Resides, RN  Family/Support System Care:   self-care encouraged   support provided  Supportive Measures:   active listening utilized   positive reinforcement provided   self-care encouraged   verbalization of feelings encouraged

## 2023-04-15 NOTE — Nurses Notes (Signed)
Patient discharged home with family.  AVS reviewed with patient/care giver.  A written copy of the AVS and discharge instructions was given to the patient/care giver.  Questions sufficiently answered as needed.  Patient/care giver encouraged to follow up with PCP as indicated.  In the event of an emergency, patient/care giver instructed to call 911 or go to the nearest emergency room.    Travon Crochet, RN

## 2023-04-15 NOTE — Discharge Summary (Signed)
DISCHARGE SUMMARY      Date of Service:  04/15/2023  [name], [name], 88 y.o. female  Date of Birth:  17-Dec-1934  PCP: Marylee Floras, MD    ADMISSION DATE:  04/13/2023  DISCHARGE DATE:  04/15/2023    ATTENDING PHYSICIAN: Marylee Floras, MD  PRIMARY CARE PHYSICIAN: Marylee Floras, MD     PRIMARY ADMISSION DIAGNOSIS: Constipation    DISCHARGE DIAGNOSES:     1. Severe constipation     2. Severe protein calorie malnutrition    3. Generalized muscle weakness and deconditioning    Active Hospital Problems    Diagnosis Date Noted    Principal Problem: Constipation [K59.00] 04/01/2023      Resolved Hospital Problems   No resolved problems to display.     Active Non-Hospital Problems    Diagnosis Date Noted    Fecal impaction of colon (CMS HCC) 02/05/2023    Protein-calorie malnutrition, unspecified severity (CMS HCC) 09/08/2022    Vitamin D deficiency 12/23/2016    High risk medication use 12/23/2016    HLD (hyperlipidemia) 12/03/2016    Hypothyroidism 06/30/2006        DISCHARGE MEDICATIONS:     Current Discharge Medication List        START taking these medications.        Details   lidocaine 4 % Adhesive Patch, Medicated  Start taking on: April 16, 2023   1 Patch, Transdermal, DAILY  Qty: 30 Patch  Refills: 11     metoclopramide HCl 5 mg Tablet  Commonly known as: REGLAN   5 mg, Oral, 3 TIMES DAILY BEFORE MEALS  Qty: 90 Tablet  Refills: 11     simethicone 80 mg Tablet, Chewable  Commonly known as: MYLICON   160 mg, Oral, 4 TIMES DAILY  Qty: 240 Tablet  Refills: 11            CONTINUE these medications which have CHANGED during your visit.        Details   lactulose 20 gram/30 mL Solution  Commonly known as: ENULOSE  What changed: medication strength   30 mL, Oral, 2 TIMES DAILY  Qty: 1800 mL  Refills: 11            CONTINUE these medications - NO CHANGES were made during your visit.        Details   levothyroxine 75 mcg Tablet  Commonly known as: SYNTHROID   75 mcg, Oral, EVERY MORNING  Qty: 90  Tablet  Refills: 3            STOP taking these medications.      furosemide 20 mg Tablet  Commonly known as: Lasix     nystatin 100,000 unit/mL Suspension  Commonly known as: MYCOSTATIN              DISCHARGE INSTRUCTIONS:   No discharge procedures on file.    REASON FOR HOSPITALIZATION AND HOSPITAL COURSE:  This is a 88 y.o. female severe constipation.  Failed outpatient management.  I have been in the emergency room 2 separate times.  Mid to the hospital.  KUB demonstrated significant stool burden on the right side of the colon.  Underwent Gastrografin enema.  Resolution of symptoms.  Was started on regiment of lactulose and Reglan.  Bowels were moving.  Abdomen no longer distended.  Tolerating a pureed type diet very well to time of discharge.  Family will consider continue these types of foods at home since patient was eating  the more significantly.  She does have severe protein calorie malnutrition    PHYSICAL EXAM AT DISCHARGE:   Temperature: 36.4 C (97.5 F)  Heart Rate: 70  BP (Non-Invasive): (!) 108/56  Respiratory Rate: 18  SpO2: 95 %  General: no distress  Eyes: Conjunctiva clear., Sclera non-icteric.   Lungs: Breathing nonlabored, Clear to auscultation bilaterally.   Cardiovascular: regular rate and rhythm, S1, S2 normal, no murmur, click, rub or gallop  Abdomen: non-distended, Soft, non-tender, Bowel sounds normal  Extremities: No edema  Skin: Skin warm and dry      SIGNIFICANT LAB: Lab Results for Last 24 Hours:    Results for orders placed or performed during the hospital encounter of 04/13/23 (from the past 24 hour(s))   BASIC METABOLIC PANEL   Result Value Ref Range    SODIUM 133 (L) 136 - 145 mmol/L    POTASSIUM 3.2 (L) 3.5 - 5.1 mmol/L    CHLORIDE 101 96 - 111 mmol/L    CO2 TOTAL 24 23 - 31 mmol/L    ANION GAP 8 4 - 13 mmol/L    CALCIUM 8.3 (L) 8.6 - 10.3 mg/dL    GLUCOSE 79 65 - 295 mg/dL    BUN 10 8 - 25 mg/dL    CREATININE 6.21 3.08 - 1.05 mg/dL    BUN/CREA RATIO 13 6 - 22    ESTIMATED GFR -  FEMALE 75 >=60 mL/min/BSA   CBC   Result Value Ref Range    WBC 8.9 3.7 - 11.0 x10^3/uL    RBC 3.85 3.85 - 5.22 x10^6/uL    HGB 11.6 11.5 - 16.0 g/dL    HCT 65.7 84.6 - 96.2 %    MCV 91.2 78.0 - 100.0 fL    MCH 30.1 26.0 - 32.0 pg    MCHC 33.0 31.0 - 35.5 g/dL    RDW-CV 95.2 84.1 - 32.4 %    PLATELETS 184 150 - 400 x10^3/uL    MPV 9.2 8.7 - 12.5 fL   MAGNESIUM   Result Value Ref Range    MAGNESIUM 2.1 1.8 - 2.6 mg/dL   PHOSPHORUS   Result Value Ref Range    PHOSPHORUS 3.8 2.3 - 4.0 mg/dL       SIGNIFICANT RADIOLOGY:  Gastrografin enema    CONSULTATIONS:  None    PROCEDURES PERFORMED:  None    CONDITION ON DISCHARGE: Alert, Oriented, and VS Stable    DISCHARGE DISPOSITION:  Home discharge     ISSUES FOR OUTPATIENT F/U:   Office visit to see me within 1 week of discharge    I spent more than 30 minutes discharging this patient, including review of current and discharge medications, final assessment, diagnosis and treatment of the patient; discussion of hospital course and discharge instructions with the patient and/or family; and arrangements for follow-up care, including appointments, needed prescriptions, referrals, and/or preparation of discharge records.      cc: Primary Care Physician:  Marylee Floras, MD  [address]  [place] [id number]     [id number] Physician:  Marylee Floras, MD  [id number]  [place],  [place] [address]     Marylee Floras, MD10/2/202413:22

## 2023-04-15 NOTE — Care Plan (Signed)
Problem: Adult Inpatient Plan of Care  Goal: Absence of Hospital-Acquired Illness or Injury  Intervention: Identify and Manage Fall Risk  Recent Flowsheet Documentation  Taken 04/15/2023 0800 by Georgiann Mccoy, LPN  Safety Promotion/Fall Prevention:   activity supervised   fall prevention program maintained   nonskid shoes/slippers when out of bed   safety round/check completed  Intervention: Prevent Skin Injury  Recent Flowsheet Documentation  Taken 04/15/2023 0800 by Georgiann Mccoy, LPN  Body Position: supine, head elevated  Intervention: Prevent and Manage VTE (Venous Thromboembolism) Risk  Recent Flowsheet Documentation  Taken 04/15/2023 0800 by Georgiann Mccoy, LPN  VTE Prevention/Management: ambulation promoted     Problem: Skin Injury Risk Increased  Goal: Skin Health and Integrity  Intervention: Optimize Skin Protection  Recent Flowsheet Documentation  Taken 04/15/2023 0800 by Georgiann Mccoy, LPN  Head of Bed [facility]) Positioning: [facility] elevated     Problem: Fall Injury Risk  Goal: Absence of Fall and Fall-Related Injury  Intervention: Promote Injury-Free Environment  Recent Flowsheet Documentation  Taken 04/15/2023 0800 by Georgiann Mccoy, LPN  Safety Promotion/Fall Prevention:   activity supervised   fall prevention program maintained   nonskid shoes/slippers when out of bed   safety round/check completed

## 2023-04-16 ENCOUNTER — Telehealth (HOSPITAL_COMMUNITY): Payer: Self-pay | Admitting: Internal Medicine

## 2023-04-16 NOTE — Telephone Encounter (Signed)
Transition of Care Contact Information  Discharge Date: 04/15/2023  Transition Facility Type--Hospital (Inpatient or Observation)  Facility [facility]  Interactive Contact(s): Completed or attempted contact indicated by Date/Time  Completed Contact: 04/16/2023  9:11 AM  First Attempt Call: 04/16/2023  9:09 AM  Contact Method(s)-- Patient/Caregiver Telephone  Clinical Staff Name/Role who contacted--Madalyn Legner Peggye Form, BSN, RN  Transition Assessment  Discharge Summary obtained?--Yes  How are you recovering?--Improving  Discharge Meds obtained?--Yes  Discharge medication changes reviewed?--Yes  Full Medication Reconciliation Completed?--No  Medication understanding --knows new medication(s)--knows purpose of medication  Medication Concerns?--No  Have everything needed for recovery?--Yes  Care Coordination:   Patient has transition follow-up appointment date and time?--Yes  Follow up appointment date:--04/21/2023  Specialist Transition Visit planned?  Specialist Transition Visit date:  Patient/caregiver plans to attend transition visit?--Yes  Primary Follow-up Barrier  Interventions provided --reinforced discharge instructions--follow-up appointment date/time reinforced--med list reviewed with patient--patient expresses understanding of follow-up plan  Home Health or DME ordered at discharge?--No  Clinician/Team notified?--No  Primary reason clinician notified?  Transition Note:Plan:      Patient requests transportation referral for follow up appointment? No  Instructions:     Reviewed After Visit [place] (AVS) and discharge instructions as outlined on AVS. Yes  Agrees to contact PCP for non-acute symptoms during the hours of 8 a.m. - 4 p.m., PCP number provided/reinforced. Yes  Nurse Navigator toll free number, resources available, and 24/7 hours of operation provided to patient and/or caregiver. Yes    Referrals to population health? No    Additional information/assessment:  Patient denies any needs or  concerns.  Patient verbalizes understanding of instructions, information, education and has no further questions.         Dreama Saa, RN    [image]

## 2023-04-21 ENCOUNTER — Other Ambulatory Visit: Payer: Self-pay

## 2023-04-21 ENCOUNTER — Ambulatory Visit: Payer: Medicare Other | Attending: Internal Medicine | Admitting: Internal Medicine

## 2023-04-21 ENCOUNTER — Encounter (HOSPITAL_BASED_OUTPATIENT_CLINIC_OR_DEPARTMENT_OTHER): Payer: Self-pay | Admitting: Internal Medicine

## 2023-04-21 VITALS — BP 102/66 | HR 79 | Ht 62.0 in | Wt <= 1120 oz

## 2023-04-21 DIAGNOSIS — Z09 Encounter for follow-up examination after completed treatment for conditions other than malignant neoplasm: Secondary | ICD-10-CM | POA: Insufficient documentation

## 2023-04-21 DIAGNOSIS — K5900 Constipation, unspecified: Secondary | ICD-10-CM | POA: Insufficient documentation

## 2023-04-21 DIAGNOSIS — E43 Unspecified severe protein-calorie malnutrition: Secondary | ICD-10-CM | POA: Insufficient documentation

## 2023-04-21 NOTE — H&P (Signed)
[facility] Internal Medicine  [address] [address]  Dept: [phone]  Dept Fax: [phone]    NAME:  [name]  DOB:  05/11/1935  AGE:  89 y.o.  MRN:  [id number]  APPT:  04/21/2023 10:00 AM EDT    SUBJECTIVE:  [name] is a 88 y.o. female is here today for hospital discharge follow up.  She was admitted from 04/13/23 to 04/15/23 for severe constipation.  Gastrografin enema worked.  Significant bowel movements afterwards.  No obvious obstruction.  Patient has follow-up appointment with Gastroenterology however that is not until August 03, 2023.  Will keep that appointment.  Patient has been compliant with her medicines at home but not as compliant as she needs to be to allow her family to blenderized her diet.  She had that type of diet in the hospital very pleased with that more recently to digest..    Transition of Care Contact Information  Discharge Date: 04/15/2023  Transition Facility Type--Hospital (Inpatient or Observation)  Facility [facility]  Interactive Contact(s): Completed or attempted contact indicated by Date/Time  Completed Contact: 04/16/2023  9:11 AM  First Attempt Call: 04/16/2023  9:09 AM  Contact Method(s)-- Patient/Caregiver Telephone  Clinical Staff Name/Role who contacted--Lisa Peggye Form, BSN, RN  Transition Assessment  Discharge Summary obtained?--Yes  How are you recovering?--Improving  Discharge Meds obtained?--Yes  Discharge medication changes reviewed?--Yes  Full Medication Reconciliation Completed?--No  Medication understanding --knows new medication(s)--knows purpose of medication  Medication Concerns?--No  Have everything needed for recovery?--Yes  Care Coordination:   Patient has transition follow-up appointment date and time?--Yes  Follow up appointment date:--04/21/2023  Specialist Transition Visit planned?  Specialist Transition Visit date:  Patient/caregiver plans to attend transition visit?--Yes  Primary Follow-up  Barrier  Interventions provided --reinforced discharge instructions--follow-up appointment date/time reinforced--med list reviewed with patient--patient expresses understanding of follow-up plan  Home Health or DME ordered at discharge?--No  Clinician/Team notified?--No  Primary reason clinician notified?  Transition Note:Plan:      Patient requests transportation referral for follow up appointment? No  Instructions:     Reviewed After Visit [place] (AVS) and discharge instructions as outlined on AVS. Yes  Agrees to contact PCP for non-acute symptoms during the hours of 8 a.m. - 4 p.m., PCP number provided/reinforced. Yes  Nurse Navigator toll free number, resources available, and 24/7 hours of operation provided to patient and/or caregiver. Yes    Referrals to population health? No    Additional information/assessment:  Patient denies any needs or concerns.  Patient verbalizes understanding of instructions, information, education and has no further questions.         Dreama Saa, RN    [image]                Face to face completed on  04/21/2023    Results reviewed:     Labs and radiological images reviewed in Epic.    I have reviewed the patient's medical, surgical, family and social history in detail today, 04/21/2023  and updated the computerized patient record as appropriate.  I reviewed the discharge summary from the recent hospital stay.    Current Outpatient Medications   Medication Sig    lactulose (ENULOSE) 10 gram/15 mL Oral Solution Take 30 mL by mouth Twice daily    levothyroxine (SYNTHROID) 75 mcg Oral Tablet Take 1 Tablet (75 mcg total) by mouth Every morning    lidocaine 4 % Adhesive Patch, Medicated Place 1 Patch on  the skin Once a day    metoclopramide HCl (REGLAN) 5 mg Oral Tablet Take 1 Tablet (5 mg total) by mouth Three times daily before meals    simethicone (MYLICON) 80 mg Oral Tablet, Chewable Chew and swallow 2 Tablets (160 mg total) by mouth Four times a day       Discharge medications were  reviewed with the patient and the current medication list has been reconciled.      REVIEW OF SYSTEMS:  CARDIAC:  No chest pain, DOE, or palpitations. No orthopnea or PND.    PULMONARY:  No cough, sputum, or hemoptysis. No fever,chills, or night sweats.    GI:  No nausea or vomiting. No abdominal pain. No change in bowel habits          No melena or bright red rectal bleeding.    GU :  Voiding without difficulty         OBJECTIVE:   Physical Exam:  BP 102/66   Pulse 79   Ht 1.575 m (5\' 2" )   Wt (!) 31.3 kg (69 lb)   LMP  (LMP Unknown)   SpO2 96%   BMI 12.62 kg/m       General appearance: alert, oriented x 3, in her normal state, cooperative, not in apparent distress, appearing stated age   Neck: No lymphadenopathy or thyroid anomalies noted, carotids 2 + bilaterally without bruit  Lungs: clear to auscultation bilaterally. No crackles or wheeze noted. Normal respiratory effort  Heart: No JVD. Regular rate and rhythm, S1, S2 normal.  No gallop or rub. No murmur  Abdomen: soft, non-tender. Bowel sounds normal. No hepatosplenomegaly.  Extremities: no cyanosis or edema.  Pulses intact.  No calf pain  ASSESSMENT and PLAN:    1. Hospital discharge follow-up  Completed    2. Constipation, unspecified constipation type  Continue present doses of cathartics.  Tolerating well    3. Severe protein-calorie malnutrition (CMS HCC)  Counseled on nutrition.  Patient now agreeable to allow family to blenderized her food.  Will also add probiotic.      medical decision making:moderate    No orders of the defined types were placed in this encounter.      The patient was given the opportunity to ask questions and those questions were answered to the patient's satisfaction. The patient was encouraged to call with any additional questions or concerns. Instructed patient to call back if symptoms worse.   Discussed with patient effects and side effects of medications. Medication safety was discussed. A copy of the patient's  medication list was printed and given to the patient. A good faith effort was made to reconcile the patient's medications.   Medication reconciliation was performed and medication education was provided.  Education and care coordination was provided.    Education  disease management    Follow up in: 1 month    Advised to call back directly if there are further questions, or if these symptoms fail to improve as anticipated or worsen.    Marylee Floras, MD  INTERNAL MEDICINE, PHYSICIAN OFFICE BUILDING    [address]  [place] [address]  Dept: [phone]  Dept Fax: [phone]

## 2023-04-27 ENCOUNTER — Other Ambulatory Visit: Payer: Self-pay

## 2023-04-29 ENCOUNTER — Encounter: Payer: Self-pay | Admitting: Oncology

## 2023-05-19 ENCOUNTER — Ambulatory Visit (HOSPITAL_BASED_OUTPATIENT_CLINIC_OR_DEPARTMENT_OTHER): Payer: Self-pay | Admitting: Internal Medicine

## 2023-05-19 ENCOUNTER — Telehealth (HOSPITAL_BASED_OUTPATIENT_CLINIC_OR_DEPARTMENT_OTHER): Payer: Self-pay | Admitting: Internal Medicine

## 2023-05-19 NOTE — Telephone Encounter (Signed)
The patients daughter called wanting [place] to order a home health aid to come to the home to do an enema for the patient. Advised the daughter that unless the patient is already on service with Home Health they will not just come to the home one time for that. Advised that if the patient needed she would have to do it or take the patient to the ER. She states that the patient says that she wasn't feeling good today but says that this afternoon she is some better. The daughter will wait to see how the patient is progressing tomorrow and if her bowels move and if needed will take her to the ER.  Danella Deis, MA

## 2023-05-22 ENCOUNTER — Other Ambulatory Visit: Payer: Self-pay

## 2023-05-22 ENCOUNTER — Emergency Department
Admission: EM | Admit: 2023-05-22 | Discharge: 2023-05-22 | Disposition: A | Discharge status: Home / Self Care | Payer: Medicare Other | Source: Home / Self Care | Attending: EMERGENCY MEDICINE | Admitting: EMERGENCY MEDICINE

## 2023-05-22 ENCOUNTER — Encounter (HOSPITAL_COMMUNITY): Payer: Self-pay

## 2023-05-22 ENCOUNTER — Telehealth (HOSPITAL_BASED_OUTPATIENT_CLINIC_OR_DEPARTMENT_OTHER): Payer: Self-pay | Admitting: Internal Medicine

## 2023-05-22 DIAGNOSIS — I4589 Other specified conduction disorders: Secondary | ICD-10-CM | POA: Insufficient documentation

## 2023-05-22 DIAGNOSIS — R9431 Abnormal electrocardiogram [ECG] [EKG]: Secondary | ICD-10-CM | POA: Insufficient documentation

## 2023-05-22 DIAGNOSIS — I491 Atrial premature depolarization: Secondary | ICD-10-CM | POA: Insufficient documentation

## 2023-05-22 DIAGNOSIS — K5900 Constipation, unspecified: Secondary | ICD-10-CM | POA: Insufficient documentation

## 2023-05-22 LAB — URINALYSIS, MACRO/MICRO
BILIRUBIN: NEGATIVE mg/dL
BLOOD: NEGATIVE mg/dL
COLOR: NORMAL
GLUCOSE: NORMAL mg/dL
KETONES: NEGATIVE mg/dL
LEUKOCYTES: 500 WBCs/uL — AB
PH: 7 (ref 5.0–8.0)
PROTEIN: NEGATIVE mg/dL
SPECIFIC GRAVITY: 1.003 — ABNORMAL LOW (ref 1.005–1.030)
UROBILINOGEN: NORMAL mg/dL

## 2023-05-22 LAB — CBC WITH DIFF
BASOPHIL #: <0.1 10*3/uL (ref <=0.20)
BASOPHIL %: 1 %
EOSINOPHIL #: <0.1 10*3/uL (ref <=0.50)
EOSINOPHIL %: 0.8 %
HCT: 37.8 % (ref 34.8–46.0)
HGB: 12.8 g/dL (ref 11.5–16.0)
IMMATURE GRANULOCYTE #: <0.1 10*3/uL (ref <0.10)
IMMATURE GRANULOCYTE %: 0.3 % (ref 0.0–1.0)
LYMPHOCYTE #: 1.94 10*3/uL (ref 1.00–4.80)
LYMPHOCYTE %: 26.6 %
MCH: 31.8 pg (ref 26.0–32.0)
MCHC: 33.9 g/dL (ref 31.0–35.5)
MCV: 93.8 fL (ref 78.0–100.0)
MONOCYTE #: 0.71 10*3/uL (ref 0.20–1.10)
MONOCYTE %: 9.8 %
MPV: 8.7 fL (ref 8.7–12.5)
NEUTROPHIL #: 4.48 10*3/uL (ref 1.50–7.70)
NEUTROPHIL %: 61.5 %
PLATELETS: 228 10*3/uL (ref 150–400)
RBC: 4.03 10*6/uL (ref 3.85–5.22)
RDW-CV: 14.7 % (ref 11.5–15.5)
WBC: 7.3 10*3/uL (ref 3.7–11.0)

## 2023-05-22 LAB — BASIC METABOLIC PANEL
ANION GAP: 6 mmol/L (ref 4–13)
BUN/CREA RATIO: 18 (ref 6–22)
BUN: 14 mg/dL (ref 8–25)
CALCIUM: 8.4 mg/dL — ABNORMAL LOW (ref 8.6–10.3)
CHLORIDE: 102 mmol/L (ref 96–111)
CO2 TOTAL: 26 mmol/L (ref 23–31)
CREATININE: 0.77 mg/dL (ref 0.60–1.05)
ESTIMATED GFR - FEMALE: 75 mL/min/BSA (ref >=60)
GLUCOSE: 122 mg/dL (ref 65–125)
POTASSIUM: 3.8 mmol/L (ref 3.5–5.1)
SODIUM: 134 mmol/L — ABNORMAL LOW (ref 136–145)

## 2023-05-22 LAB — TROPONIN-I: TROPONIN-I HS: <2.7 ng/L (ref <=14.0)

## 2023-05-22 LAB — HEPATIC FUNCTION PANEL
ALBUMIN: 3.4 g/dL (ref 3.4–4.8)
ALKALINE PHOSPHATASE: 59 U/L (ref 55–145)
ALT (SGPT): 7 U/L — ABNORMAL LOW (ref 8–22)
AST (SGOT): 17 U/L (ref 8–45)
BILIRUBIN DIRECT: 0.3 mg/dL (ref 0.1–0.4)
BILIRUBIN TOTAL: 0.8 mg/dL (ref 0.3–1.3)
PROTEIN TOTAL: 6 g/dL (ref 6.0–8.0)

## 2023-05-22 LAB — LIPASE: LIPASE: 54 U/L (ref 10–60)

## 2023-05-22 MED ORDER — SODIUM PHOSPHATES 19 GRAM-7 GRAM/118 ML ENEMA
133.0000 mL | ENEMA | Freq: Once | RECTAL | Status: AC | PRN
Start: 2023-05-22 — End: 2023-05-22
  Administered 2023-05-22: 133 mL via RECTAL

## 2023-05-22 MED ORDER — CEPHALEXIN 500 MG CAPSULE
500.0000 mg | ORAL_CAPSULE | Freq: Three times a day (TID) | ORAL | 0 refills | Status: AC
Start: 2023-05-22 — End: 2023-05-29
  Filled 2023-05-22: qty 21, 7d supply, fill #0

## 2023-05-22 NOTE — ED Nurses Note (Signed)

## 2023-05-22 NOTE — Discharge Instructions (Signed)
You were seen in the emergency department for abdominal discomfort associated with constipation.  You had a digital rectal exam and manual disimpaction to relieve stool burden.  I believe it is safe for you to take lactulose once in the morning and once in the evening through the weekend to promote stool passage, however pass the weekend I recommend that you follow up with your regular doctor to discuss if long-term use of lactulose twice a day is safe.  Return to emergency department with any abdominal pain associated with fever, any large volume blood in stool or other worrisome symptoms.

## 2023-05-22 NOTE — Telephone Encounter (Signed)
noted 

## 2023-05-22 NOTE — ED Nurses Note (Signed)
The risk and benefits have been discussed with the patient in regards to placing a peripheral intravenous catheter (PIV) and/or drawing labs in the triage area. The patient was advised that they may have to wait in the ED waiting room after the PIV is placed. The patient was advised not to tamper with the PIV or infuse anything through the PIV. The patient was advised if there is any concern or problem with the PIV to contact a nurse or a staff representative to contact a nurse for the patient. The patient was advised if they choose to leave before being seen by a provider or without treatment; the patient needs to notify the a nurse or a staff representative to contact a nurse, so the PIV can be removed. The patient is aware not to leave the ED waiting room area while the PIV is in place. The patient verbalizes understanding and agrees with the plan of action.

## 2023-05-22 NOTE — Telephone Encounter (Signed)
Almeta's daughter called and stated she is taking her to the ER. She said she is constipated and extremely weak.  Delfin Gant, MA

## 2023-05-22 NOTE — ED Provider Notes (Signed)
[facility] - Emergency Department  ED Primary Provider Note  History of Present Illness   chief complaint  Patient 88 year old female with fibromyalgia, hypothyroidism, intermittent constipation here for abdominal discomfort.  She describes history of recurrent constipation, she has been admitted to her primary care provider proximally 4-5 weeks ago where she she underwent a Gastrografin enema which relieved her symptoms, she has been on lactulose daily in addition to pureed food diet to assist with passage of stool.  She describes over the last week she has had lower abdominal fullness, discomfort when attempting to pass stool, she passed several small hard pellets within the last 48 hours, still feels as if there is more stool to pass and feels that she has, "backed up".  Denies vomiting, denies fever chills chest discomfort or shortness of breath.      Abdominal Pain  Associated symptoms: constipation    Constipation  Associated symptoms: abdominal pain          Review of Systems: Negative outside of components mentioned above    Physical Exam   ED Triage Vitals [05/22/23 0931]   BP (Non-Invasive) (!) 152/91   Heart Rate 99   Respiratory Rate 20   Temperature 36.3 C (97.3 F)   SpO2 96 %   Weight (!) 33.2 kg (73 lb 3.1 oz)   Height 1.575 m (5\' 2" )     Physical Exam  Vitals and nursing note reviewed.   Constitutional:       General: She is not in acute distress.     Appearance: She is well-developed.      Comments: Well-appearing frail elderly woman, in no acute distress   HENT:      Head: Normocephalic and atraumatic.      Mouth/Throat:      Mouth: Mucous membranes are moist.   Eyes:      Extraocular Movements: Extraocular movements intact.      Conjunctiva/sclera: Conjunctivae normal.      Pupils: Pupils are equal, round, and reactive to light.   Cardiovascular:      Rate and Rhythm: Normal rate and regular rhythm.      Heart sounds: No murmur heard.  Pulmonary:      Effort: Pulmonary effort is  normal. No respiratory distress.      Breath sounds: Normal breath sounds.   Abdominal:      General: There is distension.      Palpations: Abdomen is soft.      Tenderness: There is no abdominal tenderness.      Comments: Mild distention lower abdomen, soft no guarding no rebound   Genitourinary:     Rectum: Normal.      Comments: No sign of external hemorrhoids or anal fissure, no sign of rectal prolapse.  On digital rectal exam formed stool ball palpated.  Musculoskeletal:         General: No swelling.      Cervical back: Neck supple.   Skin:     General: Skin is warm and dry.      Capillary Refill: Capillary refill takes less than 2 seconds.   Neurological:      General: No focal deficit present.      Mental Status: She is alert and oriented to person, place, and time.   Psychiatric:         Mood and Affect: Mood normal.         Behavior: Behavior normal.       Patient  Data     Labs Ordered/Reviewed   BASIC METABOLIC PANEL - Abnormal; Notable for the following components:       Result Value    SODIUM 134 (*)     CALCIUM 8.4 (*)     All other components within normal limits   HEPATIC FUNCTION PANEL - Abnormal; Notable for the following components:    ALT (SGPT) 7 (*)     All other components within normal limits   URINALYSIS, MACRO/MICRO - Abnormal; Notable for the following components:    APPEARANCE Turbid (*)     SPECIFIC GRAVITY 1.003 (*)     LEUKOCYTES 500 (*)     NITRITE 2+ (*)     RBCS 5-10 (*)     WBCS 50-75 (*)     All other components within normal limits   LIPASE - Normal   TROPONIN-I - Normal   URINE CULTURE,ROUTINE   CBC/DIFF    Narrative:     The following orders were created for panel order CBC/DIFF.  Procedure                               Abnormality         Status                     ---------                               -----------         ------                     CBC WITH VHQI[[id number]]                                    Final result                 Please view results for these tests on  the individual orders.   URINALYSIS WITH REFLEX MICROSCOPIC AND CULTURE IF POSITIVE    Narrative:     The following orders were created for panel order URINALYSIS WITH REFLEX MICROSCOPIC AND CULTURE IF POSITIVE.  Procedure                               Abnormality         Status                     ---------                               -----------         ------                     URINALYSIS, MACRO/MICRO[[id number]]      Abnormal            Final result                 Please view results for these tests on the individual orders.   CBC WITH DIFF     No orders to display     Medical Decision Making        Medical Decision Making  Patient arrived to emergency department overall well-appearing vitals within acceptable limits and in no acute distress.  Patient overall well-appearing with benign abdominal exam, does not appear consistent with toxic megacolon or other acute surgical process.  Patient without any vomiting or decrease in oral intake, does not appear consistent with small-bowel obstruction.  Most likely diagnosis is acute on chronic constipation.  Offered patient digital rectal exam versus enema, enemas have helped in the past, she would like to attempt enema and reassess.    Risk  OTC drugs.        ED Course as of 05/22/23 1851   Fri May 22, 2023   4782 EKG reviewed by me showing what appears to be sinus rhythm with prolonged PR interval, on comparison to prior EKGs appears patient with chronically prolonged QTC, left bundle morphology although QRS less than 120 milliseconds, T-wave inversions in V3 through V6 which appear chronic in addition to T-wave inversions in lead 1 aVL and lead 2 which appear chronic.   1032 CBC and CMP within acceptable limits   1154 Manual disimpaction performed at bedside, moderate amount of soft stool with portions of hard pellets removed patient tolerated procedure bleeding or skin tear noted during or after procedure.   1209 Urinalysis appears concerning for urinary tract  infection, we will begin outpatient treatment.            Medications Administered in the ED   sodium phosphates (FLEET) 133 mL RETENTION ENEMA (133 mL Rectal Given 05/22/23 1106)     Clinical Impression   Constipation, unspecified constipation type (Primary)       Disposition: Discharged

## 2023-05-23 ENCOUNTER — Telehealth (HOSPITAL_COMMUNITY): Payer: Self-pay | Admitting: Internal Medicine

## 2023-05-23 NOTE — Telephone Encounter (Signed)
Post Ed Follow-Up    Post ED Follow-Up:   Document completed and/or attempted interactive contact(s) after transition to home after emergency department stay.:   Transition Facility and relevant Date:   Discharge Date: 05/22/23  Discharge from [facility] Emergency Department?: Yes  Discharge Facility: [facility]  Contacted by: Milinda Pointer RN  Contact method: Patient/Caregiver Telephone  Contact first attempt: 05/23/2023  1:03 PM  MyChart message sent?: No (Comment: pending status)  Continuous Ring, no Answer, No VM pickup, MyChart is pending.   Milinda Pointer, RN

## 2023-05-24 DIAGNOSIS — I252 Old myocardial infarction: Secondary | ICD-10-CM

## 2023-05-24 DIAGNOSIS — R9431 Abnormal electrocardiogram [ECG] [EKG]: Secondary | ICD-10-CM

## 2023-05-24 DIAGNOSIS — I492 Junctional premature depolarization: Secondary | ICD-10-CM

## 2023-05-24 LAB — ECG 12 LEAD - ED USE
Atrial Rate: 93 {beats}/min
Calculated P Axis: 61 degrees
Calculated R Axis: 3 degrees
Calculated T Axis: 164 degrees
PR Interval: 208 ms
QRS Duration: 116 ms
QT Interval: 412 ms
QTC Calculation: 512 ms
Ventricular rate: 93 {beats}/min

## 2023-05-25 LAB — URINE CULTURE,ROUTINE
URINE CULTURE: 50000 — AB
URINE CULTURE: 100000 — AB

## 2023-05-27 ENCOUNTER — Encounter: Payer: Self-pay | Admitting: Psychiatry

## 2023-06-03 DIAGNOSIS — R55 Syncope and collapse: Secondary | ICD-10-CM | POA: Diagnosis not present

## 2023-06-15 ENCOUNTER — Other Ambulatory Visit: Payer: Self-pay

## 2023-06-29 ENCOUNTER — Other Ambulatory Visit: Payer: Self-pay

## 2023-06-29 ENCOUNTER — Inpatient Hospital Stay (HOSPITAL_COMMUNITY)
Admission: RE | Admit: 2023-06-29 | Discharge: 2023-06-29 | Disposition: A | Discharge status: Home / Self Care | Payer: Medicare Other | Source: Ambulatory Visit

## 2023-06-29 ENCOUNTER — Emergency Department (HOSPITAL_COMMUNITY): Payer: Medicare Other

## 2023-06-29 ENCOUNTER — Inpatient Hospital Stay
Admission: EM | Admit: 2023-06-29 | Discharge: 2023-07-02 | DRG: 291 | Disposition: A | Discharge status: Home / Self Care | Payer: Medicare Other | Attending: Internal Medicine | Admitting: Internal Medicine

## 2023-06-29 ENCOUNTER — Encounter (HOSPITAL_COMMUNITY): Payer: Self-pay

## 2023-06-29 ENCOUNTER — Inpatient Hospital Stay (HOSPITAL_COMMUNITY): Payer: Medicare Other | Admitting: Internal Medicine

## 2023-06-29 DIAGNOSIS — R54 Age-related physical debility: Secondary | ICD-10-CM

## 2023-06-29 DIAGNOSIS — K5909 Other constipation: Secondary | ICD-10-CM

## 2023-06-29 DIAGNOSIS — I509 Heart failure, unspecified: Secondary | ICD-10-CM

## 2023-06-29 DIAGNOSIS — M7989 Other specified soft tissue disorders: Secondary | ICD-10-CM

## 2023-06-29 DIAGNOSIS — J90 Pleural effusion, not elsewhere classified: Secondary | ICD-10-CM

## 2023-06-29 DIAGNOSIS — Z79899 Other long term (current) drug therapy: Secondary | ICD-10-CM

## 2023-06-29 DIAGNOSIS — R0600 Dyspnea, unspecified: Principal | ICD-10-CM

## 2023-06-29 DIAGNOSIS — J918 Pleural effusion in other conditions classified elsewhere: Secondary | ICD-10-CM | POA: Diagnosis present

## 2023-06-29 DIAGNOSIS — Z79890 Hormone replacement therapy (postmenopausal): Secondary | ICD-10-CM

## 2023-06-29 DIAGNOSIS — I5023 Acute on chronic systolic (congestive) heart failure: Principal | ICD-10-CM | POA: Diagnosis present

## 2023-06-29 DIAGNOSIS — E43 Unspecified severe protein-calorie malnutrition: Secondary | ICD-10-CM

## 2023-06-29 DIAGNOSIS — Z681 Body mass index (BMI) 19 or less, adult: Secondary | ICD-10-CM

## 2023-06-29 LAB — CBC WITH DIFF
BASOPHIL #: <0.1 10*3/uL (ref <=0.20)
BASOPHIL %: 1.2 %
EOSINOPHIL #: <0.1 10*3/uL (ref <=0.50)
EOSINOPHIL %: 0.4 %
HCT: 40.2 % (ref 34.8–46.0)
HGB: 13.3 g/dL (ref 11.5–16.0)
IMMATURE GRANULOCYTE #: <0.1 10*3/uL (ref <0.10)
IMMATURE GRANULOCYTE %: 0.2 % (ref 0.0–1.0)
LYMPHOCYTE #: 1.29 10*3/uL (ref 1.00–4.80)
LYMPHOCYTE %: 24.9 %
MCH: 31.6 pg (ref 26.0–32.0)
MCHC: 33.1 g/dL (ref 31.0–35.5)
MCV: 95.5 fL (ref 78.0–100.0)
MONOCYTE #: 0.48 10*3/uL (ref 0.20–1.10)
MONOCYTE %: 9.3 %
MPV: 9.6 fL (ref 8.7–12.5)
NEUTROPHIL #: 3.32 10*3/uL (ref 1.50–7.70)
NEUTROPHIL %: 64 %
PLATELETS: 248 10*3/uL (ref 150–400)
RBC: 4.21 10*6/uL (ref 3.85–5.22)
RDW-CV: 13.7 % (ref 11.5–15.5)
WBC: 5.2 10*3/uL (ref 3.7–11.0)

## 2023-06-29 LAB — COVID-19, FLU A/B, RSV RAPID BY PCR
INFLUENZA VIRUS TYPE A: NOT DETECTED
INFLUENZA VIRUS TYPE B: NOT DETECTED
RESPIRATORY SYNCTIAL VIRUS (RSV): NOT DETECTED
SARS-CoV-2: NOT DETECTED

## 2023-06-29 LAB — BASIC METABOLIC PANEL
ANION GAP: 11 mmol/L (ref 4–13)
BUN/CREA RATIO: 14 (ref 6–22)
BUN: 11 mg/dL (ref 8–25)
CALCIUM: 8.7 mg/dL (ref 8.6–10.3)
CHLORIDE: 100 mmol/L (ref 96–111)
CO2 TOTAL: 23 mmol/L (ref 23–31)
CREATININE: 0.76 mg/dL (ref 0.60–1.05)
ESTIMATED GFR - FEMALE: 76 mL/min/BSA (ref >=60)
GLUCOSE: 98 mg/dL (ref 65–125)
POTASSIUM: 4.1 mmol/L (ref 3.5–5.1)
SODIUM: 134 mmol/L — ABNORMAL LOW (ref 136–145)

## 2023-06-29 LAB — BLOOD GAS
%FIO2 (VENOUS): 21 %
BASE EXCESS: 6.3 mmol/L — ABNORMAL HIGH (ref 0.0–3.0)
BICARBONATE (VENOUS): 28.4 mmol/L (ref 22.0–29.0)
O2 SATURATION (VENOUS): 43.8 % (ref 40.0–85.0)
PCO2 (VENOUS): 56 mm[Hg] — ABNORMAL HIGH (ref 41–51)
PH (VENOUS): 7.38 (ref 7.32–7.43)
PO2 (VENOUS): 25 mm[Hg] (ref 35–50)

## 2023-06-29 LAB — B-TYPE NATRIURETIC PEPTIDE (BNP),PLASMA: BNP: 244 pg/mL — ABNORMAL HIGH (ref 0–100)

## 2023-06-29 LAB — TROPONIN-I
TROPONIN-I HS: 2.7 ng/L (ref <=14.0)
TROPONIN-I HS: 5.4 ng/L (ref <=14.0)

## 2023-06-29 LAB — PT/INR: INR: 1.02 (ref 0.80–1.10)

## 2023-06-29 MED ORDER — LACTULOSE 20 GRAM/30 ML ORAL SOLUTION
30.0000 mL | Freq: Two times a day (BID) | ORAL | Status: DC
Start: 2023-06-29 — End: 2023-06-30
  Administered 2023-06-29 – 2023-06-30 (×2): 30 mL via ORAL
  Filled 2023-06-29 (×2): qty 30

## 2023-06-29 MED ORDER — SIMETHICONE 80 MG CHEWABLE TABLET
160.0000 mg | CHEWABLE_TABLET | Freq: Four times a day (QID) | ORAL | Status: DC
Start: 2023-06-29 — End: 2023-07-02
  Administered 2023-06-29 – 2023-07-02 (×11): 160 mg via ORAL
  Filled 2023-06-29 (×11): qty 2

## 2023-06-29 MED ORDER — ONDANSETRON HCL (PF) 4 MG/2 ML INJECTION SOLUTION
4.0000 mg | Freq: Four times a day (QID) | INTRAMUSCULAR | Status: DC | PRN
Start: 2023-06-29 — End: 2023-07-02

## 2023-06-29 MED ORDER — IOPAMIDOL 300 MG IODINE/ML (61 %) INTRAVENOUS SOLUTION
30.0000 mL | INTRAVENOUS | Status: AC
Start: 2023-06-29 — End: 2023-06-29
  Administered 2023-06-29: 30 mL via INTRAVENOUS

## 2023-06-29 MED ORDER — SODIUM CHLORIDE 0.9 % (FLUSH) INJECTION SYRINGE
3.0000 mL | INJECTION | INTRAMUSCULAR | Status: DC | PRN
Start: 2023-06-29 — End: 2023-07-02

## 2023-06-29 MED ORDER — LEVOTHYROXINE 75 MCG TABLET
75.0000 ug | ORAL_TABLET | Freq: Every morning | ORAL | Status: DC
Start: 2023-06-29 — End: 2023-07-02
  Administered 2023-06-29: 0 ug via ORAL
  Administered 2023-06-30 – 2023-07-02 (×3): 75 ug via ORAL
  Filled 2023-06-29 (×4): qty 1

## 2023-06-29 MED ORDER — METOCLOPRAMIDE 5 MG TABLET
5.0000 mg | ORAL_TABLET | Freq: Three times a day (TID) | ORAL | Status: DC
Start: 2023-06-29 — End: 2023-07-02
  Administered 2023-06-29 – 2023-07-02 (×9): 5 mg via ORAL
  Filled 2023-06-29 (×9): qty 1

## 2023-06-29 MED ORDER — FUROSEMIDE 10 MG/ML INJECTION SOLUTION
20.0000 mg | Freq: Once | INTRAMUSCULAR | Status: AC
Start: 2023-06-29 — End: 2023-06-29
  Administered 2023-06-29: 20 mg via INTRAVENOUS
  Filled 2023-06-29: qty 4

## 2023-06-29 MED ORDER — ALUMINUM-MAG HYDROXIDE-SIMETHICONE 200 MG-200 MG-20 MG/5 ML ORAL SUSP
20.0000 mL | ORAL | Status: DC | PRN
Start: 2023-06-29 — End: 2023-07-02
  Administered 2023-06-30: 20 mL via ORAL
  Filled 2023-06-29: qty 30

## 2023-06-29 MED ORDER — ACETAMINOPHEN 325 MG TABLET
650.0000 mg | ORAL_TABLET | Freq: Four times a day (QID) | ORAL | Status: DC | PRN
Start: 2023-06-29 — End: 2023-07-02

## 2023-06-29 MED ORDER — FUROSEMIDE 10 MG/ML INJECTION SOLUTION
20.0000 mg | Freq: Every day | INTRAMUSCULAR | Status: DC
Start: 2023-06-29 — End: 2023-07-02
  Administered 2023-06-29 – 2023-07-02 (×4): 20 mg via INTRAVENOUS
  Filled 2023-06-29 (×4): qty 2

## 2023-06-29 MED ORDER — SODIUM CHLORIDE 0.9 % (FLUSH) INJECTION SYRINGE
3.0000 mL | INJECTION | Freq: Three times a day (TID) | INTRAMUSCULAR | Status: DC
Start: 2023-06-29 — End: 2023-07-02
  Administered 2023-06-29: 3 mL
  Administered 2023-06-29 – 2023-06-30 (×3): 0 mL
  Administered 2023-06-30: 3 mL
  Administered 2023-07-01 – 2023-07-02 (×4): 0 mL

## 2023-06-29 MED ORDER — SODIUM CHLORIDE 0.9 % (FLUSH) INJECTION SYRINGE
10.0000 mL | INJECTION | INTRAMUSCULAR | Status: DC
Start: 2023-06-29 — End: 2023-07-02

## 2023-06-29 NOTE — Care Management Notes (Signed)
[facility]  Care Management Initial Evaluation    Patient Name: ERMINIE MCELMURRAY  Date of Birth: [birth year]  Sex: female  Date/Time of Admission: 06/29/2023  9:02 AM  Room/Bed: 4136/A  Payor: MEDICARE / Plan: MEDICARE PART A AND B / Product Type: Medicare /   PCP: Marylee Floras, MD    Pharmacy Info:   Preferred Pharmacy       [facility] [address], [place] - [address]    [address] [facility] [place] [address]    Phone: [phone] Fax: [phone]    Hours: Not open 24 hours    [facility]    [address] [place] [address]    Phone: [phone] Fax: [phone]    Hours: Monday-Friday 7AM-7PM, Saturday 10AM-3PM, Closed Sunday          Emergency Contact Info:   Extended Emergency Contact Information  Primary Emergency Contact: vincent,donna   [place]  Home Phone: [phone]  Mobile Phone: [phone]  Relation: None    History:   Mairany L Sharma is a 88 y.o., female, admitted shortness of breath    Height/Weight: 158.8 cm (5\' 2.5") / (!) 35.5 kg (78 lb 4.2 oz)     LOS: 0 days   Admitting Diagnosis: No admission diagnoses are documented for this encounter.    Assessment:      12 /16/24 1443   Assessment Details   Assessment Type Admission   Date of Care Management Update 06/29/23   Readmission   Is this a readmission? No   Insurance Information/Type   Insurance type Medicare   Employment/Financial   Patient has Prescription Coverage?  Yes        Name of Insurance Coverage for Medications Unknown   Financial/Environmental Concerns none   Living Environment   Lives With child(ren), adult   Living Arrangements house   Able to Return to Prior Arrangements yes   Home Safety   Home Assessment: No Problems Identified   Home Accessibility no concerns   Care Management Plan   Discharge Planning Status initial meeting   Projected Discharge Date 07/02/23   Discharge plan discussed with: Patient   CM will evaluate for rehabilitation potential yes   Patient choice  offered to patient/family no   Form for patient choice reviewed/signed and on chart no   Discharge Needs Assessment   Equipment Currently Used at Home walker, rolling;shower chair   Equipment Needed After Discharge none   Discharge Facility/Level of Care Needs Home vs Home with Home Health   Transportation Available family or friend will provide   Referral Information   Admission Type inpatient   Address Verified verified-no changes   Arrived From home or self-care   ADVANCE DIRECTIVES   Does the Patient have an Advance Directive? No, Information Offered and Refused     Discharge Plan:  Home vs home with Home Health  Patient confirmed address, phone number, emergency contact(s), PCP, and pharmacy on file. Patient can afford medications, however prescription insurance is unknown at this time. Patient lives with adult son on the first floor of the home. Adult daughter comes to assist in care multiple times a week. Patient has a rollator and shower chair at home to use when needed. Utilities on and working. Reports no unprescribed drug use or alcohol intake. Patient has good support and feels safe to DC home once medically stable and appropriate. Patient has no needs at this time.     The  patient will continue to be evaluated for developing discharge needs.     Case Manager: Shelbie Hutching, SOCIAL WORKER  Phone: [phone]

## 2023-06-29 NOTE — ED Provider Notes (Signed)
[facility] - Emergency Department  ED Primary Provider Note  History of Present Illness   Chief Complaint   Patient presents with    Shortness of Breath     Pt presents to ED with c/o SOB x 1 week.  Symptoms worse today.       [name] is a 88 y.o. female who had concerns including Shortness of Breath.  Arrival: The patient arrived by Private Vehicle    HPI  Patient presents to the ED via POV with c/o shortness of breath. Patient states for the past week she has been experiencing shortness of breath with exertion, however over the past couple days it has been with rest as well. Patient confirms fatigue, cough, and bilateral ankle swelling. Patient also reports abdominal pain and low back pain as well, however reports this is chronic with niece reporting they have been placing pain patches to patient's back with mild alleviation. Patient denies fever, chest pain, cardiac stents, tobacco use, hx blood clots, as well as any other medical concerns or issues at this time.     History Reviewed This Encounter: Medical History  Surgical History  Family History  Social History    Physical Exam   ED Triage Vitals   BP (Non-Invasive) 06/29/23 0823 (!) 185/107   Heart Rate 06/29/23 0822 (!) 105   Respiratory Rate 06/29/23 0822 19   Temperature 06/29/23 0822 (!) 35.6 C (96.1 F)   SpO2 06/29/23 0822 96 %   Weight 06/29/23 0822 (!) 35.5 kg (78 lb 4.2 oz)   Height 06/29/23 0822 1.575 m (5\' 2" )     Physical Exam  Vitals reviewed.   Constitutional:       Appearance: She is cachectic.      Comments: Appears frail   Eyes:      Extraocular Movements: Extraocular movements intact.      Pupils: Pupils are equal, round, and reactive to light.   Cardiovascular:      Rate and Rhythm: Normal rate and regular rhythm.      Heart sounds: Normal heart sounds.   Pulmonary:      Effort: Pulmonary effort is normal.      Comments: Course lung sounds R lower lobe   Abdominal:      General: Abdomen is flat. There is no  distension.      Palpations: Abdomen is soft. There is no mass.      Tenderness: There is no abdominal tenderness.   Musculoskeletal:         General: No signs of injury.      Right ankle: Swelling present.      Left ankle: Swelling present.   Skin:     General: Skin is warm and dry.   Neurological:      General: No focal deficit present.      Mental Status: She is alert and oriented to person, place, and time.      Sensory: No sensory deficit.      Motor: No weakness.       Patient Data     Labs Ordered/Reviewed   BLOOD GAS - Abnormal; Notable for the following components:       Result Value    PCO2 (VENOUS) 56 (*)     BASE EXCESS 6.3 (*)     All other components within normal limits    Narrative:     Manufacturer does not recommend venous sample for assessment of patient oxygenation status. A reference range  for pO2, Oxyhemoglobin and Oxygen Saturation is provided but abnormal results will not flag in Epic.   BASIC METABOLIC PANEL - Abnormal; Notable for the following components:    SODIUM 134 (*)     All other components within normal limits   B-TYPE NATRIURETIC PEPTIDE (BNP),PLASMA - Abnormal; Notable for the following components:    BNP 244 (*)     All other components within normal limits   TROPONIN-I - Normal   PT/INR - Normal    Narrative:     Coumadin therapy INR range for Conventional Anticoagulation is 2.0 to 3.0 and for Intensive Anticoagulation 2.5 to 3.5.   COVID-19, FLU A/B, RSV RAPID BY PCR - Normal    Narrative:     Results are for the simultaneous qualitative identification of SARS-CoV-2 (formerly 2019-nCoV), Influenza A, Influenza B, and RSV RNA. These etiologic agents are generally detectable in nasopharyngeal and nasal swabs during the ACUTE PHASE of infection. Hence, this test is intended to be performed on respiratory specimens collected from individuals with signs and symptoms of upper respiratory tract infection who meet Centers for Disease Control and Prevention (CDC) clinical and/or  epidemiological criteria for Coronavirus Disease 2019 (COVID-19) testing. CDC COVID-19 criteria for testing on human specimens is available at [facility] webpage information for Healthcare Professionals: Coronavirus Disease 2019 (COVID-19) ([employer]).     False-negative results may occur if the virus has genomic mutations, insertions, deletions, or rearrangements or if performed very early in the course of illness. Otherwise, negative results indicate virus specific RNA targets are not detected, however negative results do not preclude SARS-CoV-2 infection/COVID-19, Influenza, or Respiratory syncytial virus infection. Results should not be used as the sole basis for patient management decisions. Negative results must be combined with clinical observations, patient history, and epidemiological information. If upper respiratory tract infection is still suspected based on exposure history together with other clinical findings, re-testing should be considered.    Test methodology:   Cepheid Xpert Xpress SARS-CoV-2/Flu/RSV Assay real-time polymerase chain reaction (RT-PCR) test on the GeneXpert Dx and Xpert Xpress systems.   CBC/DIFF    Narrative:     The following orders were created for panel order CBC/DIFF.  Procedure                               Abnormality         Status                     ---------                               -----------         ------                     CBC WITH GNFA[[id number]]                                    Final result                 Please view results for these tests on the individual orders.   CBC WITH DIFF     CT ANGIO CHEST FOR PULMONARY EMBOLUS W IV CONTRAST   Final Result by Edi, Radresults In (12/16 1054)   1. Moderate-sized partially loculated left pleural effusion  with small right pleural effusion. Near-complete collapse of the left lower lobe is noted.   2. No pulmonary embolus.   3. Left hydronephrosis.   4. Multiple thoracic and  lumbar compression fractures, of uncertain chronicity.                  The CT exam was performed using one or more of the following dose reduction techniques: Automated exposure control, adjustment of the mA and/or kV according to the patient's size, or use of iterative reconstruction technique.            Radiologist location ID: [id number]         XR CHEST PA AND LATERAL   Final Result by Edi, Radresults In (12/16 0951)   Small right and moderate left pleural effusions with associated atelectasis or consolidation.                        Radiologist location ID: [id number]           Medical Decision Making        Medical Decision Making  Amount and/or Complexity of Data Reviewed  Radiology: ordered.  ECG/medicine tests: independent interpretation performed.    Risk  Prescription drug management.  Decision regarding hospitalization.      ED Course as of 06/29/23 1213   Mon Jun 29, 2023   665 88 year old female presenting to the emergency room for shortness of breath.  Patient states that over the last week has had increasing shortness of breath even at rest.  No chest pain.  No reported fevers.  She does state that she has had a cough which has not been very productive.  No smoking history.  Patient does have family history of blood clots.  On exam regular heart rate and rhythm with coarse lung sounds in right lower lobe, edema around bilateral ankles.  Frail and cachectic appearing.   2202 EKG shows sinus tachycardia with nonspecific T-wave abnormalities   1005 Chest xray:  IMPRESSION:  Small right and moderate left pleural effusions with associated atelectasis or consolidation.     1006 SODIUM(!): 134   1006 POTASSIUM: 4.1   1006 CREATININE: 0.76   1006 WBC: 5.2   1006 HGB: 13.3   1008 B-TYPE NATRIURETIC PEPTIDE(!): 244   1211 CT chest:  IMPRESSION:  1.Moderate-sized partially loculated left pleural effusion with small right pleural effusion. Near-complete collapse of the left lower lobe is noted.  2.No  pulmonary embolus.  3.Left hydronephrosis.  4.Multiple thoracic and lumbar compression fractures, of uncertain chronicity.   1211 Given patient's leg swelling, shortness of breath with bilateral pleural effusions suspect patient may have component of heart failure.  20 mg of IV Lasix provided.  Discussed patient care with her family medicine doctor who is agreeable for admission and further evaluation.            Medications Administered in the ED   NS flush syringe (has no administration in time range)   furosemide (LASIX) 10 mg/mL injection (has no administration in time range)   iopamidol (ISOVUE-300) 61% infusion (30 mL Intravenous Given 06/29/23 1042)     Clinical Impression   Dyspnea, unspecified type (Primary)   Pleural effusion       Disposition: Admitted         I am scribing for, and in the presence of Dr. Curly Rim for services provided on 06/29/2023.  Gomez Cleverly, SCRIBE  06/29/2023, 09:03    I personally  performed the services described in this documentation, as scribed  in my presence, and it is both accurate  and complete.    Curly Rim, MD

## 2023-06-29 NOTE — Nurses Notes (Signed)
Patient is alert and oriented x4. VS stable. IV is clean dry and intact. Patient has not had complaints of pain this shift. Patient is very hard of hearing. Patient stated she has to eat her food pureed and medications crushed and in applesauce. Patient is in bed with HOB elevated, wheels locked, call light in reach, bed alarm on. Amador Cunas, LPN

## 2023-06-29 NOTE — H&P (Signed)
General Medicine  Admission H&P    Date of Service:  06/29/2023  [name], [name], 88 y.o. female  Date of Admission:  06/29/2023  Date of Birth:  [birth year]  PCP: Marylee Floras, MD          Information Obtained from: patient and relative  Chief Complaint:  Shortness a breath    HPI: [name] is a 88 y.o., White female who presents with several weeks progressively worsening shortness for breath with exertion.  Last several days shortness for breath at rest.  Degree of orthopnea.  One episode of PND.  No chest pain or palpitations.  Evaluated in the emergency room.  Found to have a large left pleural effusion small right pleural effusion and evidence of congestive heart failure.  Subsequent mid to the hospital.      Patient's last echocardiogram was 4-5 years ago.  Had mildly reduced left ventricular function.  No significant valvular anomalies were noted at that time.    PAST MEDICAL:    Past Medical History:   Diagnosis Date    Fibromyalgia     Hypercholesterolemia     Thyroid disease         Past Surgical History:   Procedure Laterality Date    HX BREAST LUMPECTOMY Right             Medications Prior to Admission       Prescriptions    lactulose (ENULOSE) 10 gram/15 mL Oral Solution    Take 30 mL by mouth Twice daily    levothyroxine (SYNTHROID) 75 mcg Oral Tablet    Take 1 Tablet (75 mcg total) by mouth Every morning    lidocaine 4 % Adhesive Patch, Medicated    Place 1 Patch on the skin Once a day    metoclopramide HCl (REGLAN) 5 mg Oral Tablet    Take 1 Tablet (5 mg total) by mouth Three times daily before meals    simethicone (MYLICON) 80 mg Oral Tablet, Chewable    Chew and swallow 2 Tablets (160 mg total) by mouth Four times a day          Allergies   Allergen Reactions    Lactose Rash    Grass Pollen        Family History  Family Medical History:       Problem Relation (Age of Onset)    Hypertension (High Blood Pressure) Mother, Father               Social History  Social History     Tobacco  Use    Smoking status: Never    Smokeless tobacco: Never   Substance Use Topics    Alcohol use: Never        ROS:  Remainder review of systems was negative were appear to be at baseline and noncontributory    Examination:  Temperature: (!) 35.6 C (96.1 F) Heart Rate: 96 BP (Non-Invasive): (!) 140/85   Respiratory Rate: (!) 22 SpO2: 96 %       Vitals: BP (!) 140/85   Pulse 96   Temp (!) 35.6 C (96.1 F)   Resp (!) 22   Ht 1.575 m (5\' 2" )   Wt (!) 35.5 kg (78 lb 4.2 oz)   LMP  (LMP Unknown)   SpO2 96%   BMI 14.31 kg/m       General: mild distress.  On oxygen  Eyes: Conjunctiva clear., Sclera non-icteric.   Lungs:  Decreased breath  sounds in the bases especially on the left.  Crackles on the right.  No bronchospasm.  Increased respiratory effort with conversation noted.  Cardiovascular:    Heart regular rate and rhythm, S1, S2 normal, no murmur, click, rub or gallop and    Vascular   pulses 1+ throughout  Abdomen: soft, non-tender, bowel sounds normal, and non-distended  Extremities:  1+ pitting bilateral lower leg edema  Skin: Skin warm and dry  Neurologic: grossly normal  Psychiatric: AOx3   Labs:    Lab Results Today:    Results for orders placed or performed during the hospital encounter of 06/29/23 (from the past 24 hour(s))   ECG 12 LEAD - ED USE   Result Value Ref Range    Ventricular rate 103 BPM    Atrial Rate 103 BPM    PR Interval 198 ms    QRS Duration 122 ms    QT Interval 372 ms    QTC Calculation 487 ms    Calculated P Axis 51 degrees    Calculated R Axis 23 degrees    Calculated T Axis 141 degrees   BLOOD GAS Venous   Result Value Ref Range    %FIO2 (VENOUS) 21.0 %    PH (VENOUS) 7.38 7.32 - 7.43    PCO2 (VENOUS) 56 (H) 41 - 51 mm/Hg    PO2 (VENOUS) 25 35 - 50 mm/Hg    BICARBONATE (VENOUS) 28.4 22.0 - 29.0 mmol/L    BASE EXCESS 6.3 (H) 0.0 - 3.0 mmol/L    O2 SATURATION (VENOUS) 43.8 40.0 - 85.0 %   BASIC METABOLIC PANEL   Result Value Ref Range    SODIUM 134 (L) 136 - 145 mmol/L    POTASSIUM  4.1 3.5 - 5.1 mmol/L    CHLORIDE 100 96 - 111 mmol/L    CO2 TOTAL 23 23 - 31 mmol/L    ANION GAP 11 4 - 13 mmol/L    CALCIUM 8.7 8.6 - 10.3 mg/dL    GLUCOSE 98 65 - 295 mg/dL    BUN 11 8 - 25 mg/dL    CREATININE 6.21 3.08 - 1.05 mg/dL    BUN/CREA RATIO 14 6 - 22    ESTIMATED GFR - FEMALE 76 >=60 mL/min/BSA   TROPONIN-I   Result Value Ref Range    TROPONIN-I HS 2.7 <=14.0 ng/L ng/L   B-TYPE NATRIURETIC PEPTIDE   Result Value Ref Range    BNP 244 (H) 0 - 100 pg/mL   PT/INR   Result Value Ref Range    INR 1.02 0.80 - 1.10   CBC WITH DIFF   Result Value Ref Range    WBC 5.2 3.7 - 11.0 x10^3/uL    RBC 4.21 3.85 - 5.22 x10^6/uL    HGB 13.3 11.5 - 16.0 g/dL    HCT 65.7 84.6 - 96.2 %    MCV 95.5 78.0 - 100.0 fL    MCH 31.6 26.0 - 32.0 pg    MCHC 33.1 31.0 - 35.5 g/dL    RDW-CV 95.2 84.1 - 32.4 %    PLATELETS 248 150 - 400 x10^3/uL    MPV 9.6 8.7 - 12.5 fL    NEUTROPHIL % 64.0 %    LYMPHOCYTE % 24.9 %    MONOCYTE % 9.3 %    EOSINOPHIL % 0.4 %    BASOPHIL % 1.2 %    NEUTROPHIL # 3.32 1.50 - 7.70 x10^3/uL    LYMPHOCYTE # 1.29 1.00 - 4.80 x10^3/uL  MONOCYTE # 0.48 0.20 - 1.10 x10^3/uL    EOSINOPHIL # <0.10 <=0.50 x10^3/uL    BASOPHIL # <0.10 <=0.20 x10^3/uL    IMMATURE GRANULOCYTE % 0.2 0.0 - 1.0 %    IMMATURE GRANULOCYTE # <0.10 <0.10 x10^3/uL   COVID-19, FLU A/B, RSV RAPID BY PCR   Result Value Ref Range    SARS-CoV-2 Not Detected Not Detected    INFLUENZA VIRUS TYPE A Not Detected Not Detected    INFLUENZA VIRUS TYPE B Not Detected Not Detected    RESPIRATORY SYNCTIAL VIRUS (RSV) Not Detected Not Detected       Imaging Studies:  Reviewed  Results for orders placed or performed during the hospital encounter of 06/29/23 (from the past 24 hour(s))   XR CHEST PA AND LATERAL     Status: None    Narrative    Austin L Ingraham    PROCEDURE DESCRIPTION: XR CHEST PA AND LATERAL    CLINICAL INDICATION: sob    TECHNIQUE: 2 views / 2 images submitted.    COMPARISON: 04/01/2023      FINDINGS: The heart is enlarged. There is a small  right and moderate left pleural effusion with associated atelectasis. No pneumothorax is seen. Scoliosis of the thoracolumbar spine is noted.      Impression    Small right and moderate left pleural effusions with associated atelectasis or consolidation.                Radiologist location ID: [id number]     CT ANGIO CHEST FOR PULMONARY EMBOLUS W IV CONTRAST     Status: None    Narrative    Nylia L Baldwin    PROCEDURE DESCRIPTION: CT ANGIO CHEST FOR PULMONARY EMBOLUS W IV CONTRAST    CTA chest was performed to evaluate for pulmonary embolism with 3D/MIP reconstructions generated.    CLINICAL INDICATION: dyspnea    COMPARISON: No prior studies were compared.      FINDINGS: There is a moderate-sized partially loculated left pleural effusion and a small right pleural effusion. Near complete collapse left lower lobe is seen. The central airways are clear.    The right atrium and ventricle are enlarged. No pericardial effusion is seen. There is no pulmonary embolus. No mediastinal or hilar adenopathy or mass is seen.    There is dilation of the left renal collecting system, only partially imaged. There is scoliosis of the thoracolumbar spine with compression fracture noted at T6, T11, T12, and L1. These are of uncertain chronicity. There are advanced degenerative changes of the bilateral shoulders.      Impression    1. Moderate-sized partially loculated left pleural effusion with small right pleural effusion. Near-complete collapse of the left lower lobe is noted.  2. No pulmonary embolus.  3. Left hydronephrosis.  4. Multiple thoracic and lumbar compression fractures, of uncertain chronicity.            The CT exam was performed using one or more of the following dose reduction techniques: Automated exposure control, adjustment of the mA and/or kV according to the patient's size, or use of iterative reconstruction technique.        Radiologist location ID: [id number]         DNR Status:   Prior    Assessment/Plan:   There are no active hospital problems to display for this patient.    1. Congestive heart failure.  Admit to the hospital.  Repeat 2D echo.  Diurese patient.  Monitor labs closely.  2. Large left since dramatic pleural effusion.  Discussed with patient's family member.  Will proceed with thoracentesis for therapy in addition to analysis.      3. Severe protein calorie malnutrition.  Add food supplements.  Encourage p.o. intake.  4. Chronic constipation continue medications from home.        Marylee Floras, MD 06/29/2023 12:46

## 2023-06-29 NOTE — ED Nurses Note (Signed)
Report called to [facility], RN on 4N.

## 2023-06-29 NOTE — Care Plan (Signed)
Problem: Adult Inpatient Plan of Care  Goal: Plan of Care Review  Outcome: Ongoing (see interventions/notes)  Goal: Patient-Specific Goal (Individualized)  Outcome: Ongoing (see interventions/notes)  Goal: Absence of Hospital-Acquired Illness or Injury  Outcome: Ongoing (see interventions/notes)  Intervention: Identify and Manage Fall Risk  Recent Flowsheet Documentation  Taken 06/29/2023 2200 by Adin Hector, LPN  Safety Promotion/Fall Prevention:   activity supervised   fall prevention program maintained   nonskid shoes/slippers when out of bed   safety round/check completed  Taken 06/29/2023 2021 by Adin Hector, LPN  Safety Promotion/Fall Prevention:   activity supervised   fall prevention program maintained   nonskid shoes/slippers when out of bed   safety round/check completed  Intervention: Prevent and Manage VTE (Venous Thromboembolism) Risk  Recent Flowsheet Documentation  Taken 06/29/2023 2331 by Adin Hector, LPN  VTE Prevention/Management:   ambulation promoted   patient refused intervention  Taken 06/29/2023 2021 by Adin Hector, LPN  VTE Prevention/Management: ambulation promoted  Goal: Optimal Comfort and Wellbeing  Outcome: Ongoing (see interventions/notes)  Goal: Rounds/Family Conference  Outcome: Ongoing (see interventions/notes)     Problem: Health Knowledge, Opportunity to Enhance (Adult,Obstetrics,Pediatric)  Goal: Knowledgeable about Health Subject/Topic  Description: Patient will demonstrate the desired outcomes by discharge/transition of care.  Outcome: Ongoing (see interventions/notes)     Problem: Skin Injury Risk Increased  Goal: Skin Health and Integrity  Outcome: Ongoing (see interventions/notes)     Problem: Pain Chronic (Persistent) (Comorbidity Management)  Goal: Acceptable Pain Control and Functional Ability  Outcome: Ongoing (see interventions/notes)     Problem: Breathing Pattern Ineffective  Goal: Effective Breathing Pattern  Outcome: Ongoing (see interventions/notes)

## 2023-06-29 NOTE — ED Attending Note (Signed)
I was physically present and directly supervised this patient's care.  Patient seen and examined with Dr Belva Agee.  Resident history and exam reviewed.   Key elements in addition to and/or correction of that documentation are as follows:    HPI :    88 y.o. female presents with chief complaint of shortness a breath.  88 year old female presenting for evaluation with shortness of breath.  Symptoms have progressed over the past several weeks.  Patient feels difficulty breathing when she is at rest.  Has a mild nonproductive cough.  Has some mild bilateral lower extremity swelling as well.  No chest pain.  No other associated symptoms    PE :   VS on presentation: BP (!) 185/107   Pulse (!) 105   Temp (!) 35.6 C (96.1 F)   Resp 19   Ht 1.575 m (5\' 2" )   Wt (!) 35.5 kg (78 lb 4.2 oz)   LMP  (LMP Unknown)   SpO2 96%   BMI 14.31 kg/m      I have seen and examined with resident and agree with exam except as documented differently below.      Nursing note and vitals reviewed.      Constitutional: Pt is alert and oriented and appears well-developed and well-nourished. No acute distress.   HENT:   Mouth/Throat: Oropharynx is clear and moist.   Eyes: Conjunctivae are clear.   Neck: Normal range of motion. Neck supple.   Cardiovascular: Normal rate, regular rhythm and intact distal pulses.    Pulmonary/Chest:  Tachypnea, speaking in full sentences, clear lungs bilaterally  Abdominal: Abdomen is soft, nontender, nondistended.    Musculoskeletal: Normal range of motion.  1+ symmetric lower extremity edema is present  Neurological: Pt is alert and oriented. Cranial nerves are grossly intact (2-12).    Skin: Skin is warm and dry without rash.  Color normal.       Data/Test :    EKG :  Sinus rhythm rate of 103 left bundle branch block PACs are present no acute ST elevation or depression QTC is 47  Images Review by me : None  Image Reports:    Labs :     Review of Prior Data :       Prior Images :    Prior EKG :   Online  Medical Records : EPIC  Transfer Docs/Images :     MDM:  88 year old female presenting for evaluation with shortness a breath.    Workup is showing new bilateral pleural effusions with a moderate left-sided loculated effusion.  We will start diuresis and admit to the hospital for further workup.    Clinical Impression :   Pleural effusions       ED Course :   Per Dr Resident     Plan :   Per Dr Resident    Dispo :   Per Dr Resident    CRITICAL CARE :

## 2023-06-30 ENCOUNTER — Inpatient Hospital Stay (HOSPITAL_COMMUNITY): Payer: Medicare Other

## 2023-06-30 DIAGNOSIS — I252 Old myocardial infarction: Secondary | ICD-10-CM

## 2023-06-30 DIAGNOSIS — R000 Tachycardia, unspecified: Secondary | ICD-10-CM

## 2023-06-30 DIAGNOSIS — R9431 Abnormal electrocardiogram [ECG] [EKG]: Secondary | ICD-10-CM

## 2023-06-30 DIAGNOSIS — R0602 Shortness of breath: Secondary | ICD-10-CM

## 2023-06-30 DIAGNOSIS — I509 Heart failure, unspecified: Secondary | ICD-10-CM

## 2023-06-30 LAB — TRANSTHORACIC ECHOCARDIOGRAM - ADULT
AV LVOT peak gradient: 2 mm[Hg]
Ao root annulus: 3 cm
Ao root annulus: 3 cm
Ascending aorta: 3.3 cm
E wave decelartion time: 262 ms
E/E' ratio: 8.5
Inferior Vena Cava Diameter: 1.4 cm
Inferior Vena Cava Diameter: 1.4 cm
LA size: 1.8 cm
LA size: 1.8 cm
LA volume: 7.03 cm3
LVOT diameter: 2 cm
Left Atrium to Aortic Root Ratio: 0.6
Left Ventricular Outflow Tract Peak Velocity: 75.9 cm/s
MV Peak E Vel: 156 cm/s
MV stenosis pressure 1/2 time: 77 ms
PISA AR VN Nyquist: 358 cm/s
TDI: 18.4 cm/s
TDI: 18.4 cm/s
Triscuspid Valve Regurgitation Peak Gradient: 22 mm[Hg]

## 2023-06-30 LAB — CBC WITH DIFF
BASOPHIL #: <0.1 10*3/uL (ref <=0.20)
BASOPHIL %: 1.1 %
EOSINOPHIL #: <0.1 10*3/uL (ref <=0.50)
EOSINOPHIL %: 0.5 %
HCT: 37.6 % (ref 34.8–46.0)
HGB: 13 g/dL (ref 11.5–16.0)
IMMATURE GRANULOCYTE #: <0.1 10*3/uL (ref <0.10)
IMMATURE GRANULOCYTE %: 0.4 % (ref 0.0–1.0)
LYMPHOCYTE #: 1.21 10*3/uL (ref 1.00–4.80)
LYMPHOCYTE %: 22 %
MCH: 32.7 pg — ABNORMAL HIGH (ref 26.0–32.0)
MCHC: 34.6 g/dL (ref 31.0–35.5)
MCV: 94.5 fL (ref 78.0–100.0)
MONOCYTE #: 0.59 10*3/uL (ref 0.20–1.10)
MONOCYTE %: 10.7 %
MPV: 9.3 fL (ref 8.7–12.5)
NEUTROPHIL #: 3.59 10*3/uL (ref 1.50–7.70)
NEUTROPHIL %: 65.3 %
PLATELETS: 237 10*3/uL (ref 150–400)
RBC: 3.98 10*6/uL (ref 3.85–5.22)
RDW-CV: 13.6 % (ref 11.5–15.5)
WBC: 5.5 10*3/uL (ref 3.7–11.0)

## 2023-06-30 LAB — TROPONIN-I
TROPONIN-I HS: 4.3 ng/L (ref <=14.0)
TROPONIN-I HS: 4.5 ng/L (ref <=14.0)

## 2023-06-30 LAB — COMPREHENSIVE METABOLIC PNL, FASTING
ALBUMIN: 3.4 g/dL (ref 3.4–4.8)
ALKALINE PHOSPHATASE: 59 U/L (ref 55–145)
ALT (SGPT): 7 U/L — ABNORMAL LOW (ref 8–22)
ANION GAP: 9 mmol/L (ref 4–13)
AST (SGOT): 19 U/L (ref 8–45)
BILIRUBIN TOTAL: 0.8 mg/dL (ref 0.3–1.3)
BUN/CREA RATIO: 17 (ref 6–22)
BUN: 14 mg/dL (ref 8–25)
CALCIUM: 9.2 mg/dL (ref 8.6–10.3)
CHLORIDE: 97 mmol/L (ref 96–111)
CO2 TOTAL: 30 mmol/L (ref 23–31)
CREATININE: 0.82 mg/dL (ref 0.60–1.05)
ESTIMATED GFR - FEMALE: 70 mL/min/BSA (ref >=60)
GLUCOSE: 86 mg/dL (ref 70–99)
POTASSIUM: 3.9 mmol/L (ref 3.5–5.1)
PROTEIN TOTAL: 5.9 g/dL — ABNORMAL LOW (ref 6.0–8.0)
SODIUM: 136 mmol/L (ref 136–145)

## 2023-06-30 LAB — ECG 12 LEAD - ED USE
Atrial Rate: 103 {beats}/min
Calculated P Axis: 51 degrees
Calculated R Axis: 23 degrees
Calculated T Axis: 141 degrees
PR Interval: 198 ms
QRS Duration: 122 ms
QT Interval: 372 ms
QTC Calculation: 487 ms
Ventricular rate: 103 {beats}/min

## 2023-06-30 LAB — PHOSPHORUS: PHOSPHORUS: 4.5 mg/dL — ABNORMAL HIGH (ref 2.3–4.0)

## 2023-06-30 LAB — MAGNESIUM: MAGNESIUM: 2 mg/dL (ref 1.8–2.6)

## 2023-06-30 LAB — LDH, BODY FLUID: LDH BODY FLUID: 101 U/L

## 2023-06-30 LAB — PT/INR: INR: 1.12 — ABNORMAL HIGH (ref 0.80–1.10)

## 2023-06-30 LAB — PROTEIN BODY FLUID: PROTEIN BODY FLUID: 3.4 g/dL

## 2023-06-30 LAB — LDH: LDH: 186 U/L (ref 125–220)

## 2023-06-30 LAB — GLUCOSE BODY FLUID: GLUCOSE BODY FLUID: 92 mg/dL (ref 65–125)

## 2023-06-30 MED ORDER — LISINOPRIL 5 MG TABLET
2.5000 mg | ORAL_TABLET | Freq: Every day | ORAL | Status: DC
Start: 2023-06-30 — End: 2023-07-02
  Administered 2023-06-30 – 2023-07-02 (×3): 2.5 mg via ORAL
  Filled 2023-06-30 (×3): qty 1

## 2023-06-30 MED ORDER — LACTULOSE 20 GRAM/30 ML ORAL SOLUTION
30.0000 mL | Freq: Three times a day (TID) | ORAL | Status: DC
Start: 2023-06-30 — End: 2023-07-02
  Administered 2023-06-30 – 2023-07-02 (×6): 30 mL via ORAL
  Filled 2023-06-30 (×6): qty 30

## 2023-06-30 MED ORDER — SENNOSIDES 8.6 MG-DOCUSATE SODIUM 50 MG TABLET
1.0000 | ORAL_TABLET | Freq: Two times a day (BID) | ORAL | Status: DC
Start: 2023-06-30 — End: 2023-07-02
  Administered 2023-06-30 – 2023-07-02 (×5): 1 via ORAL
  Filled 2023-06-30 (×5): qty 1

## 2023-06-30 MED ORDER — CARVEDILOL 3.125 MG TABLET
3.1250 mg | ORAL_TABLET | Freq: Two times a day (BID) | ORAL | Status: DC
Start: 2023-06-30 — End: 2023-07-02
  Administered 2023-06-30 – 2023-07-02 (×4): 3.125 mg via ORAL
  Filled 2023-06-30 (×4): qty 1

## 2023-06-30 NOTE — Care Management Notes (Signed)
[facility]  Care Management Note    Patient Name: [name]  Date of Birth: [birth year]  Sex: female  Date/Time of Admission: 06/29/2023  9:02 AM  Room/Bed: 4136/A  Payor: MEDICARE / Plan: MEDICARE PART A AND B / Product Type: Medicare /    LOS: 1 day   PCP: Marylee Floras, MD    Admitting Diagnosis:  CHF (congestive heart failure) (CMS HCC) [I50.9]    Assessment:      06/30/23 1100   Medicare Intent to Discharge Documentation   Admit IMM given to: Patient   Admit IMM letter given date 06/30/23   Admit IMM letter time given 1010   IMM explained/reviewed with:  Patient;verbalized understanding       Visited patient, explained Inpatient Status/IMM.  Patient verbalized understanding.  Patient indicated too weak to sign and provided undersigned auth/consent to date/time/sign.  Provided copy of IMM to patient.  Original IMM placed in patients chart.    Discharge Plan:        The patient will continue to be evaluated for developing discharge needs.     Case Manager: Farley Ly  Phone: [phone]

## 2023-06-30 NOTE — Nurses Notes (Signed)
Patient is resting in bed with call light in reach. Alert and oriented. Vitals stable this shift. Running normal sinus to sinus tachycardia on tele. No complaints at this time. Patient remains free from falls this shift.    BP (!) 110/56   Pulse 92   Temp 36.2 C (97.2 F)   Resp 16   Ht 1.588 m (5' 2.5")   Wt (!) 35.5 kg (78 lb 4.2 oz)   LMP  (LMP Unknown)   SpO2 97%   BMI 14.09 kg/m     Murriel Hopper, LPN

## 2023-06-30 NOTE — Care Plan (Signed)
Patient rested well today. Patient has no complaints of SOB. Thoracentesis performed,  fluid drained tolerated procedure well. Patient is currently on pure wick catheter and tolerates well.  Patient up in chair and walked halls with PT. Patient had some complaints of constipation, MD notified and increased bowel regimen. Patient is on telemetry and has been running sinus rhythm to sinus tach. No complaints of pains and free from falls.     Merdis Delay, LPN

## 2023-06-30 NOTE — Nurses Notes (Signed)
 Currently this patient meets requirements for a low to mid level of nursing care.  The patient will continue to be monitored and re-evaluated for any changes.     Roselyn Bering, RN

## 2023-06-30 NOTE — Ancillary Notes (Signed)
Medical Nutrition Therapy Assessment        SUBJECTIVE:   [name] is an 88 yo female who presents with worsening shortness of breath with exertion. Noted large left pleural effusion and evidence of CHF. PMH includes HLD. Pt is s/p thoracentesis on 12/17 which removed 300 ml fluid. Familiar with pt from previous admission. Pt is hard of hearing. PO intake is poor at this time, 25% intake during lunch today. Pt reports she is eating "okay". Denies nausea or abdominal pain. Does not eat large portions at baseline. Noted 5# weight gain since 11/8 (73#). Pt reports she eats pureed food at home. Likes to eat pureed spaghetti, split pea soup, and wedding soup. Pt states she is okay with Ensure Plus HP shakes even though she does not like them very much. Labs reviewed. Will continue to monitor.    OBJECTIVE:     Current Diet Order/Nutrition Support:  MNT PROTOCOL FOR DIETITIAN  DIETARY ORAL SUPPLEMENTS Product Name: Ensure Plus High Protein - Chocolate; MORNING/AFTERNOON SNACK; 1 Each  DIET REGULAR Calorie amount: CC 2500; Consistency/thickening: Solid: PUREE     Height Used for Calculations: 158.8 cm (5' 2.52")  Weight Used For Calculations: 35.5 kg (78 lb 4.2 oz)  BMI (kg/m2): 14.11  BMI Assessment: less than 18.5 - underweight  Ideal Body Weight (IBW) (kg): 51.62  % Ideal Body Weight: 68.77           Wt Readings from Last 8 Encounters:   06/29/23 (!) 35.5 kg (78 lb 4.2 oz)   05/22/23 (!) 33.2 kg (73 lb 3.1 oz)   04/21/23 (!) 31.3 kg (69 lb)   04/13/23 (!) 31 kg (68 lb 5.5 oz)   04/09/23 (!) 31.8 kg (70 lb)   04/01/23 (!) 31.3 kg (69 lb 0.1 oz)   03/10/23 (!) 31.8 kg (70 lb)   02/20/23 (!) 31.3 kg (69 lb 0.1 oz)      Comprehensive Metabolic Profile    Lab Results   Component Value Date/Time    SODIUM 136 06/30/2023 04:19 AM    POTASSIUM 3.9 06/30/2023 04:19 AM    CHLORIDE 97 06/30/2023 04:19 AM    CO2 30 06/30/2023 04:19 AM    ANIONGAP 9 06/30/2023 04:19 AM    BUN 14 06/30/2023 04:19 AM    CREATININE 0.82  06/30/2023 04:19 AM    GLUCOSE 86 06/30/2023 04:19 AM    Lab Results   Component Value Date/Time    CALCIUM 9.2 06/30/2023 04:19 AM    PHOSPHORUS 4.5 (H) 06/30/2023 04:19 AM    ALBUMIN 3.4 06/30/2023 04:19 AM    TOTALPROTEIN 5.9 (L) 06/30/2023 04:19 AM    ALKPHOS 59 06/30/2023 04:19 AM    AST 19 06/30/2023 04:19 AM    ALT 7 (L) 06/30/2023 04:19 AM    TOTBILIRUBIN 0.8 06/30/2023 04:19 AM         Current Facility-Administered Medications   Medication Dose Route Frequency    acetaminophen (TYLENOL) tablet  650 mg Oral Q6H PRN    aluminum-magnesium hydroxide-simethicone (MAG-AL PLUS) 200-200-20 mg per 5 mL oral liquid  20 mL Oral Q4H PRN    furosemide (LASIX) 10 mg/mL injection  20 mg Intravenous Daily    lactulose (ENULOSE) 20g per 30mL oral liquid  30 mL Oral 3x/day    levothyroxine (SYNTHROID) tablet  75 mcg Oral QAM    metoclopramide (REGLAN) tablet  5 mg Oral 3x/day AC    NS flush syringe  10 mL Intravenous Give in Radiology  NS flush syringe  3 mL Intracatheter Q8HRS    NS flush syringe  3 mL Intracatheter Q1H PRN    ondansetron (ZOFRAN) 2 mg/mL injection  4 mg Intravenous Q6H PRN    sennosides-docusate sodium (SENOKOT-S) 8.6-50mg  per tablet  1 Tablet Oral 2x/day    simethicone (MYLICON) chewable tablet  160 mg Oral 4x/day      Physical Assessment:   No pressure injuries or edema noted. LBM: 12/13.     A nutrition-focused physical exam was performed.  Results:  Subcutaneous fat loss (3+): orbital 3+, cheek 3+, tricep 3+  Muscle mass loss (3+): temple 3+, clavicle 3+, shoulder 3+    Estimated Needs:  Energy Calorie Requirements: 1200-1350 kcal per day (34-38 kcals/35.5 kg)  Protein Requirements (gms/day): 50-64 g per day (1.4-1.8g/35.5 kg)  Fluid Requirements: 1200-1350 ml per day (34-38 mLs/35.5 kg)    Plan/Interventions:   1. Pureed regular CC 2500 diet  2. Ensure Plus HP BID ordered  - Will order additional Gelatein to try  3. Monitor po intake, body weight, lab values    RD available as needed.    Nutrition  Diagnosis:   Unspecified severe protein-calorie malnutrition  Related to: inadequate nutrient intake   As evidenced by: Energy Intake: Less than 75% of EER in greater than or equal to 3 months, Body Fat Loss: Severe (Subcutaneous fat loss (3+): orbital 3+, cheek 3+, tricep 3+), Muscle Mass Loss: Severe (Muscle mass loss (3+): temple 3+, clavicle 3+, shoulder 3+), Other Factors: CHF, BMI Assessment: less than 18.5 - underweight (BMI: 14.1).      Creola Corn, MS, RD, LD  06/30/2023, 13:58  [facility] Clinical Dietitian  Phone ext [phone]  Ascom ext [phone]

## 2023-06-30 NOTE — OR PostOp (Signed)
IR POST-PROCEDURE NOTE  DIAGNOSIS:  PLEURAL EFFUSION  PROCEDURE:  ULTRASOUND GUIDED THORACENTESIS, left  RESULTS:  SUCCESSFUL, POST PROCEDURE CXR showed no evidence of pneumothorax (please see that report for complete details).  PERFORMING PROVIDER: Maryland Pink, PA-C  ATTENDING PHYSICIAN:  Morene Antu, MD  CONDITION:  GOOD  COMPLICATIONS: NONE  BLOOD LOSS:  < 1 cc  SPECIMEN REMOVED:      300 mL FLUID

## 2023-06-30 NOTE — Care Plan (Signed)
[facility]  Rehabilitation Services  Physical Therapy Initial Evaluation    Patient Name: [name]  Date of Birth: 07/17/1934  Height: Height: 158.8 cm (5' 2.5")  Weight: Weight: (!) 35.5 kg (78 lb 4.2 oz)  Room/Bed: 4136/A  Payor: MEDICARE / Plan: MEDICARE PART A AND B / Product Type: Medicare /     Assessment:      Pt demonstrated ability to ambulate household distances usnig rollator. Pt completed transfers on her own. Pt ambulated on room air. Pt has no concerns going home. Pt notes good family support. Pt denies recent falls. Recommend home with assist.    Discharge Needs:    Equipment Recommendation: front wheeled walker       Discharge Disposition: home with assist    JUSTIFICATION OF DISCHARGE RECOMMENDATION   Based on current diagnosis, functional performance prior to admission, and current functional performance, this patient requires continued PT services in home with assist in order to achieve significant functional improvements in these deficit areas: aerobic capacity/endurance, gait, locomotion, and balance.        Plan:   Current Intervention: balance training, bed mobility training, gait training, home exercise program, neuromuscular re-education, patient/family education, postural re-education, stair training, stretching, ROM (range of motion), strengthening, transfer training  To provide physical therapy services minimum of 5x/week  for duration of until goals are met.    The risks/benefits of therapy have been discussed with the patient/caregiver and he/she is in agreement with the established plan of care.       Subjective & Objective        06/30/23 1537   Therapist Pager   PT Assigned/ Pager # [phone]   Rehab Session   Document Type evaluation   PT Visit Date 06/30/23   Total PT Minutes: 18   Patient Effort good   Symptoms Noted During/After Treatment fatigue   General Information   Patient Profile Reviewed yes   Onset of Illness/Injury or Date of Surgery 06/29/23    Patient/Family/Caregiver Comments/Observations Pt agreeable to PT. Pt notes fatigue, as just getting up in chair   Pertinent History of Current Functional Problem Pt admitted due to [facility].  Pt underwent thoracentensis on 12/17 PMHx: HLD   Medical Lines PIV Line   Respiratory Status room air   Existing Precautions/Restrictions fall precautions   Mutuality/Individual Preferences   Individualized Care Needs FWW/assistx 1   Plan of Care Reviewed With patient   Living Environment   Lives With child(ren), adult   Living Arrangements house   Home Assessment: No Problems Identified   Home Accessibility no concerns   Living Environment Comment PT notes needs met on 1 level   Functional Level Prior   Ambulation 1 - assistive equipment  (uses no AD, FWW or cane.)   Transferring 0 - independent   Toileting 0 - independent   Bathing 0 - independent   Dressing 0 - independent   Eating 0 - independent   Communication 0 - understands/communicates without difficulty   Prior Functional Level Comment Pt reports performing ADLS on her own, meal prep on her own. Pt does not always use AD, but occasionally uses FWW or cane. Pt ison room air   Self-Care   Usual Activity Tolerance good   Current Activity Tolerance moderate   Equipment Currently Used at Home yes   Equipment Currently Used at Home walker, rolling   Pre Treatment Status   Pre Treatment Patient Status Patient sitting in bedside chair or w/c  Support Present Pre Treatment  None   Cognition   Behavior/Mood Observations behavior appropriate to situation, WNL/WFL   Orientation Status oriented x 4   Attention WNL/WFL   Follows Commands follows one step commands  (due to [facility])   Vital Signs   O2 Delivery Post Treatment room air   Pain Assessment   Pretreatment Pain Rating 5/10   Posttreatment Pain Rating 5/10   Pain Location abdomen   Pre/Posttreatment Pain Comment "gas pain"   RUE Assessment   RUE Assessment WFL for stated baseline   LUE Assessment   LUE Assessment WFL for stated  baseline   RLE Assessment   RLE Assessment WFL for stated baseline   LLE Assessment   LLE Assessment WFL for stated baseline   Bed Mobility   Supine-Sit Independence not tested   Safety Issues decreased use of arms for pushing/pulling;decreased use of legs for bridging/pushing;unable to safely maintain weight bearing restrictions   Impairments balance impaired;endurance;ROM decreased   Transfer Assessment/Treatment   Sit-Stand Independence stand-by assistance   Stand-Sit Independence stand-by assistance   Sit-Stand-Sit, Assist Device walker, four wheeled   Transfer Impairments balance impaired;endurance;strength decreased   Gait Assessment/Treatment   Independence  contact guard assist;stand-by assistance   Assistive Device  walker, four wheeled   Distance in Feet 180ftx1   Deviations  cadence decreased;step length decreased;stride length decreased   Impairments  balance impaired;endurance;strength decreased   Balance   Sitting Balance: Static fair + balance   Sitting, Dynamic (Balance) fair + balance   Sit-to-Stand Balance fair balance   Standing Balance: Static fair balance   Standing Balance: Dynamic fair balance   Post Treatment Status   Post Treatment Patient Status Patient sitting in bedside chair or w/c;Call light within reach;Patient safety alarm activated;Telephone within reach   Support Present Post Treatment  None   Plan of Care Review   Plan Of Care Reviewed With patient   Physical Therapy Clinical Impression   Assessment Pt demonstrated ability to ambulate household distances usnig rollator. Pt completed transfers on her own. Pt ambulated on room air. Pt has no concerns going home. Pt notes good family support. Pt denies recent falls. Recommend home with assist.   Criteria for Skilled Therapeutic yes;meets criteria   Pathology/Pathophysiology Noted musculoskeletal;neuromuscular;pulmonary;cardiovascular   Impairments Found (describe specific impairments) aerobic capacity/endurance;gait, locomotion, and  balance   Functional Limitations in Following  self-care   Rehab Potential good   Therapy Frequency minimum of 5x/week   Predicted Duration of Therapy Intervention (days/wks) until goals are met   Anticipated Equipment Needs at Discharge (PT) front wheeled walker   Anticipated Discharge Disposition home with assist   Evaluation Complexity Justification   Patient History: Co-morbidity/factors that impact Plan of Care 3 or more that impact Plan of Care;1-2 that impact Plan of Care   Examination Components 1-2 Exam elements addressed;3 or more Exam elements addressed   Presentation Evolving: Symptoms, complaints, characteristics of condition changing &/or cognitive deficits present   Clinical Decision Making Moderate complexity   Evaluation Complexity Moderate complexity   Care Plan Goals   PT Rehab Goals Bed Mobility Goal;Gait Training Goal;Stairs Training Goal;Transfer Training Goal   Bed Mobility Goal   Bed Mobility Goal, Date Established 06/30/23   Bed Mobility Goal, Time to Achieve 30 days   Bed Mobility Goal, Activity Type all bed mobility activities   Bed Mobility Goal, Independence Level modified independence   Bed Mobility Goal, Assistive Device least restrictive assistive device   Gait Training  Goal,  Distance to Achieve   Gait Training  Goal, Date Established 06/30/23   Gait Training  Goal, Time to Achieve 30 days   Gait Training  Goal, Independence Level modified independence   Gait Training  Goal, Assist Device least restricted assistive device   Gait Training  Goal, Distance to Achieve 300   Stairs Training Goal   Stairs Training Goal, Date Established 06/30/23   Stairs Training Goal, Time to Achieve 30 days   Stairs Training Goal, Independence Level modified independence   Stairs Training Goal, Assist Device least restrictive assistive device   Stairs Training Goal, Number of Stairs to Achieve 4   Transfer Training Goal   Transfer Training Goal, Date Established 06/30/23   Transfer Training Goal, Time to  Achieve 30 days   Transfer Training Goal, Activity Type all transfers   Transfer Training Goal, Independence Level modified independence   Planned Therapy Interventions, PT Eval   Planned Therapy Interventions (PT) balance training;bed mobility training;gait training;home exercise program;neuromuscular re-education;patient/family education;postural re-education;stair training;stretching;ROM (range of motion);strengthening;transfer training       Therapist:   Mora Bellman, PT 06/30/2023 15:43  Pager #: [phone]

## 2023-06-30 NOTE — Progress Notes (Signed)
Internal Medicine Progress Note    Names:  [name]  Date of service: 06/30/2023  Date of Admission:  06/29/2023  Hospital Day:  LOS: 1 day     Assessment/ Plan:   Active Hospital Problems    Diagnosis    CHF (congestive heart failure) (CMS HCC)       1. Congestive heart failure.  Follow up on echocardiogram.  Responded nicely to diuresis.      2. Large left pleural effusion.  Tolerated the thoracentesis well.  Some improvement symptoms.  Follow up on studies.    3. Severe protein calorie malnutrition.  Continue to encourage patient with p.o. intake and provide supplements.      PT/OT: Yes    Disposition Planning:   Home with home health possibly later this week    Subjective:  Some improvement shortness a breath.  No chest pain or palpitations.  Denies dizziness nausea vomiting.  No abdominal pain.  Passing flatus.  No bowel movement as of yet.  Voiding without difficulty.    acetaminophen (TYLENOL) tablet, 650 mg, Oral, Q6H PRN  aluminum-magnesium hydroxide-simethicone (MAG-AL PLUS) 200-200-20 mg per 5 mL oral liquid, 20 mL, Oral, Q4H PRN  furosemide (LASIX) 10 mg/mL injection, 20 mg, Intravenous, Daily  lactulose (ENULOSE) 20g per 30mL oral liquid, 30 mL, Oral, 2x/day  levothyroxine (SYNTHROID) tablet, 75 mcg, Oral, QAM  metoclopramide (REGLAN) tablet, 5 mg, Oral, 3x/day AC  NS flush syringe, 10 mL, Intravenous, Give in Radiology  NS flush syringe, 3 mL, Intracatheter, Q8HRS  NS flush syringe, 3 mL, Intracatheter, Q1H PRN  ondansetron (ZOFRAN) 2 mg/mL injection, 4 mg, Intravenous, Q6H PRN  simethicone (MYLICON) chewable tablet, 160 mg, Oral, 4x/day        Physical Exam:    Vital Signs:  BP 130/72   Pulse 98   Temp 36.4 C (97.6 F)   Resp 18   Ht 1.588 m (5' 2.5")   Wt (!) 35.5 kg (78 lb 4.2 oz)   LMP  (LMP Unknown)   SpO2 96%   BMI 14.09 kg/m         I/O:  I/O last 24 hours:    Intake/Output Summary (Last 24 hours) at 06/30/2023 1212  Last data filed at 06/29/2023 1946  Gross per 24 hour    Intake --   Output 1200 ml   Net -1200 ml     I/O current shift:  No intake/output data recorded.  Blood Sugars:     General:  Alert.  Cooperative.  Difficulty hearing.  Cardio: Regular rate and rhythm. Normal S1 and S2. No murmurs, gallops, or rubs.   Resp: Clear to auscultation bilaterally. No wheezes, rales, rhonchi or crackles. Normal resp effort.  Abd: Bowel sounds present, Soft, non-tender  Extremities:  Trace edema persists      Labs:     Results for orders placed or performed during the hospital encounter of 06/29/23 (from the past 24 hour(s))   TROPONIN-I   Result Value Ref Range    TROPONIN-I HS 5.4 <=14.0 ng/L ng/L   TROPONIN-I   Result Value Ref Range    TROPONIN-I HS 4.3 <=14.0 ng/L ng/L   LDH   Result Value Ref Range    LDH 186 125 - 220 U/L   CBC/DIFF    Narrative    The following orders were created for panel order CBC/DIFF.  Procedure  Abnormality         Status                     ---------                               -----------         ------                     CBC WITH ZOXW[[id number]]                Abnormal            Final result                 Please view results for these tests on the individual orders.   MAGNESIUM   Result Value Ref Range    MAGNESIUM 2.0 1.8 - 2.6 mg/dL   PHOSPHORUS   Result Value Ref Range    PHOSPHORUS 4.5 (H) 2.3 - 4.0 mg/dL   PT/INR   Result Value Ref Range    INR 1.12 (H) 0.80 - 1.10    Narrative    Coumadin therapy INR range for Conventional Anticoagulation is 2.0 to 3.0 and for Intensive Anticoagulation 2.5 to 3.5.   COMPREHENSIVE METABOLIC PNL, FASTING   Result Value Ref Range    SODIUM 136 136 - 145 mmol/L    POTASSIUM 3.9 3.5 - 5.1 mmol/L    CHLORIDE 97 96 - 111 mmol/L    CO2 TOTAL 30 23 - 31 mmol/L    ANION GAP 9 4 - 13 mmol/L    BUN 14 8 - 25 mg/dL    CREATININE 1.19 1.47 - 1.05 mg/dL    BUN/CREA RATIO 17 6 - 22    ALBUMIN 3.4 3.4 - 4.8 g/dL     CALCIUM 9.2 8.6 - 82.9 mg/dL    GLUCOSE 86 70 - 99 mg/dL    ALKALINE PHOSPHATASE  59 55 - 145 U/L    ALT (SGPT) 7 (L) 8 - 22 U/L    AST (SGOT)  19 8 - 45 U/L    BILIRUBIN TOTAL 0.8 0.3 - 1.3 mg/dL    PROTEIN TOTAL 5.9 (L) 6.0 - 8.0 g/dL    ESTIMATED GFR - FEMALE 70 >=60 mL/min/BSA   CBC WITH DIFF   Result Value Ref Range    WBC 5.5 3.7 - 11.0 x10^3/uL    RBC 3.98 3.85 - 5.22 x10^6/uL    HGB 13.0 11.5 - 16.0 g/dL    HCT 56.2 13.0 - 86.5 %    MCV 94.5 78.0 - 100.0 fL    MCH 32.7 (H) 26.0 - 32.0 pg    MCHC 34.6 31.0 - 35.5 g/dL    RDW-CV 78.4 69.6 - 29.5 %    PLATELETS 237 150 - 400 x10^3/uL    MPV 9.3 8.7 - 12.5 fL    NEUTROPHIL % 65.3 %    LYMPHOCYTE % 22.0 %    MONOCYTE % 10.7 %    EOSINOPHIL % 0.5 %    BASOPHIL % 1.1 %    NEUTROPHIL # 3.59 1.50 - 7.70 x10^3/uL    LYMPHOCYTE # 1.21 1.00 - 4.80 x10^3/uL    MONOCYTE # 0.59 0.20 - 1.10 x10^3/uL    EOSINOPHIL # <0.10 <=0.50 x10^3/uL    BASOPHIL # <0.10 <=0.20 x10^3/uL    IMMATURE GRANULOCYTE % 0.4 0.0 - 1.0 %  IMMATURE GRANULOCYTE # <0.10 <0.10 x10^3/uL   TROPONIN-I   Result Value Ref Range    TROPONIN-I HS 4.5 <=14.0 ng/L ng/L       Micro: No results found for any visits on 06/29/23 (from the past 96 hour(s)).    Radiology:    Results for orders placed or performed during the hospital encounter of 06/29/23 (from the past 24 hour(s))   US THORACENTESIS     Status: None    Narrative    Herberta L Matranga    PROCEDURE DESCRIPTION:  US THORACENTESIS    CLINICAL INDICATION:  Symptomatic left pleural effusion.  Therapeutic and   diagnostic    TECHNIQUE / FINDINGS: Informed consent was obtained. Ultrasound was used   to localize pleural fluid at the posterior left chest. The skin over this   area was marked, prepped, and draped in a sterile fashion. Local   anesthetic was used with 1% subcutaneous lidocaine. A 6 mm incision was   made with a #11 blade. Sterile technique was used for placement of an 8   Fr. thoracentesis catheter into the pleural space. The catheter was   connected to a vacuum container and 300 cc of fluid was obtained. The   catheter was  removed and there were no immediate complications. The   specimen was sent to the lab. Post procedure chest x-ray showed no   evidence of pneumothorax, please see that report for complete details.      Impression    Successful ultrasound-guided thoracentesis.      Procedure performed by Maryland Pink, PA-C with direct supervision   immediately available by the signing radiologist.        Radiologist location ID: [id number]     XR AP MOBILE CHEST     Status: None    Narrative    Sacoya L [place]    PROCEDURE DESCRIPTION: XR AP MOBILE CHEST    CLINICAL INDICATION: s/p left thora    TECHNIQUE: 1 views / 1 images submitted.    COMPARISON: 06/29/2023      FINDINGS: Portable chest shows improvement in left pleural effusion after thoracentesis with no pneumothorax. Chronic lung changes and biapical pleural thickening are again seen. Cardiac size is stable. Scoliosis noted. Vascularity is normal. 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      Impression    Improvement in left pleural effusion after thoracentesis with no pneumothorax.                Radiologist location ID: [id number]         Marylee Floras, MD    06/30/2023   12:12  [facility]  [facility]

## 2023-06-30 NOTE — Care Plan (Signed)
Problem: Adult Inpatient Plan of Care  Goal: Plan of Care Review  Outcome: Ongoing (see interventions/notes)  Goal: Patient-Specific Goal (Individualized)  Outcome: Ongoing (see interventions/notes)  Goal: Absence of Hospital-Acquired Illness or Injury  Outcome: Ongoing (see interventions/notes)  Intervention: Identify and Manage Fall Risk  Recent Flowsheet Documentation  Taken 06/30/2023 2200 by Adin Hector, LPN  Safety Promotion/Fall Prevention:   activity supervised   fall prevention program maintained   nonskid shoes/slippers when out of bed   safety round/check completed  Taken 06/30/2023 2000 by Adin Hector, LPN  Safety Promotion/Fall Prevention:   activity supervised   fall prevention program maintained   nonskid shoes/slippers when out of bed   safety round/check completed  Intervention: Prevent and Manage VTE (Venous Thromboembolism) Risk  Recent Flowsheet Documentation  Taken 06/30/2023 2000 by Lequita Halt T, LPN  VTE Prevention/Management:   ambulation promoted   sequential compression devices off  Goal: Optimal Comfort and Wellbeing  Outcome: Ongoing (see interventions/notes)  Goal: Rounds/Family Conference  Outcome: Ongoing (see interventions/notes)     Problem: Health Knowledge, Opportunity to Enhance (Adult,Obstetrics,Pediatric)  Goal: Knowledgeable about Health Subject/Topic  Description: Patient will demonstrate the desired outcomes by discharge/transition of care.  Outcome: Ongoing (see interventions/notes)     Problem: Skin Injury Risk Increased  Goal: Skin Health and Integrity  Outcome: Ongoing (see interventions/notes)     Problem: Pain Chronic (Persistent) (Comorbidity Management)  Goal: Acceptable Pain Control and Functional Ability  Outcome: Ongoing (see interventions/notes)     Problem: Breathing Pattern Ineffective  Goal: Effective Breathing Pattern  Outcome: Ongoing (see interventions/notes)

## 2023-06-30 NOTE — Care Plan (Signed)
Problem: Adult Inpatient Plan of Care  Goal: Plan of Care Review  Outcome: Ongoing (see interventions/notes)  Goal: Patient-Specific Goal (Individualized)  Outcome: Ongoing (see interventions/notes)  Goal: Absence of Hospital-Acquired Illness or Injury  Outcome: Ongoing (see interventions/notes)  Intervention: Prevent and Manage VTE (Venous Thromboembolism) Risk  Recent Flowsheet Documentation  Taken 06/30/2023 0858 by Merdis Delay, RN STUDENT  VTE Prevention/Management: ambulation promoted  Goal: Optimal Comfort and Wellbeing  Outcome: Ongoing (see interventions/notes)  Goal: Rounds/Family Conference  Outcome: Ongoing (see interventions/notes)     Problem: Health Knowledge, Opportunity to Enhance (Adult,Obstetrics,Pediatric)  Goal: Knowledgeable about Health Subject/Topic  Description: Patient will demonstrate the desired outcomes by discharge/transition of care.  Outcome: Ongoing (see interventions/notes)     Problem: Skin Injury Risk Increased  Goal: Skin Health and Integrity  Outcome: Ongoing (see interventions/notes)     Problem: Pain Chronic (Persistent) (Comorbidity Management)  Goal: Acceptable Pain Control and Functional Ability  Outcome: Ongoing (see interventions/notes)     Problem: Breathing Pattern Ineffective  Goal: Effective Breathing Pattern  Outcome: Ongoing (see interventions/notes)

## 2023-07-01 DIAGNOSIS — K5900 Constipation, unspecified: Secondary | ICD-10-CM

## 2023-07-01 DIAGNOSIS — I5023 Acute on chronic systolic (congestive) heart failure: Principal | ICD-10-CM

## 2023-07-01 LAB — CBC
HCT: 36.5 % (ref 34.8–46.0)
HGB: 12.3 g/dL (ref 11.5–16.0)
MCH: 32 pg (ref 26.0–32.0)
MCHC: 33.7 g/dL (ref 31.0–35.5)
MCV: 95.1 fL (ref 78.0–100.0)
MPV: 9.2 fL (ref 8.7–12.5)
PLATELETS: 218 10*3/uL (ref 150–400)
RBC: 3.84 10*6/uL — ABNORMAL LOW (ref 3.85–5.22)
RDW-CV: 13.6 % (ref 11.5–15.5)
WBC: 5 10*3/uL (ref 3.7–11.0)

## 2023-07-01 LAB — BASIC METABOLIC PANEL
ANION GAP: 5 mmol/L (ref 4–13)
BUN/CREA RATIO: 20 (ref 6–22)
BUN: 16 mg/dL (ref 8–25)
CALCIUM: 8.9 mg/dL (ref 8.6–10.3)
CHLORIDE: 94 mmol/L — ABNORMAL LOW (ref 96–111)
CO2 TOTAL: 33 mmol/L — ABNORMAL HIGH (ref 23–31)
CREATININE: 0.82 mg/dL (ref 0.60–1.05)
ESTIMATED GFR - FEMALE: 70 mL/min/BSA (ref >=60)
GLUCOSE: 85 mg/dL (ref 65–125)
POTASSIUM: 4.5 mmol/L (ref 3.5–5.1)
SODIUM: 132 mmol/L — ABNORMAL LOW (ref 136–145)

## 2023-07-01 LAB — MAGNESIUM: MAGNESIUM: 2 mg/dL (ref 1.8–2.6)

## 2023-07-01 LAB — PHOSPHORUS: PHOSPHORUS: 3.9 mg/dL (ref 2.3–4.0)

## 2023-07-01 NOTE — Care Plan (Signed)
Problem: Adult Inpatient Plan of Care  Goal: Absence of Hospital-Acquired Illness or Injury  Intervention: Identify and Manage Fall Risk  Recent Flowsheet Documentation  Taken 07/01/2023 2200 by Desma Mcgregor, LPN  Safety Promotion/Fall Prevention:   activity supervised   fall prevention program maintained   nonskid shoes/slippers when out of bed   safety round/check completed  Taken 07/01/2023 1900 by Desma Mcgregor, LPN  Safety Promotion/Fall Prevention:   activity supervised   fall prevention program maintained   nonskid shoes/slippers when out of bed   safety round/check completed  Intervention: Prevent Skin Injury  Recent Flowsheet Documentation  Taken 07/01/2023 2000 by Desma Mcgregor, LPN  Skin Protection: adhesive use limited  Intervention: Prevent and Manage VTE (Venous Thromboembolism) Risk  Recent Flowsheet Documentation  Taken 07/01/2023 2000 by Desma Mcgregor, LPN  VTE Prevention/Management: ambulation promoted     Problem: Skin Injury Risk Increased  Goal: Skin Health and Integrity  Intervention: Optimize Skin Protection  Recent Flowsheet Documentation  Taken 07/01/2023 2000 by Desma Mcgregor, LPN  Pressure Reduction Techniques: Frequent weight shifting encouraged  Pressure Reduction Devices: Repositioning wedges/pillows utilized  Skin Protection: adhesive use limited     Problem: Pain Chronic (Persistent) (Comorbidity Management)  Goal: Acceptable Pain Control and Functional Ability  Intervention: Manage Persistent Pain  Recent Flowsheet Documentation  Taken 07/01/2023 2000 by Desma Mcgregor, LPN  Bowel Elimination Promotion:   adequate fluid intake promoted   ambulation promoted

## 2023-07-01 NOTE — Care Plan (Signed)
Problem: Adult Inpatient Plan of Care  Goal: Absence of Hospital-Acquired Illness or Injury  Intervention: Identify and Manage Fall Risk  Recent Flowsheet Documentation  Taken 07/01/2023 0800 by Merdis Delay, RN STUDENT  Safety Promotion/Fall Prevention: activity supervised  Taken 07/01/2023 0705 by Merdis Delay, RN STUDENT  Safety Promotion/Fall Prevention: activity supervised  Intervention: Prevent Skin Injury  Recent Flowsheet Documentation  Taken 07/01/2023 0800 by Merdis Delay, RN STUDENT  Body Position: supine, head elevated  Taken 07/01/2023 0705 by Merdis Delay, RN STUDENT  Body Position: supine, head elevated  Intervention: Prevent and Manage VTE (Venous Thromboembolism) Risk  Recent Flowsheet Documentation  Taken 07/01/2023 0705 by Merdis Delay, RN STUDENT  VTE Prevention/Management: ambulation promoted     Problem: Skin Injury Risk Increased  Goal: Skin Health and Integrity  Intervention: Optimize Skin Protection  Recent Flowsheet Documentation  Taken 07/01/2023 0800 by Merdis Delay, RN STUDENT  Activity Management: up ad lib  Head of Bed [facility]) Positioning: HOB elevated  Taken 07/01/2023 0705 by Merdis Delay, RN STUDENT  Activity Management: up ad lib  Head of Bed (HOB) Positioning: HOB elevated     Problem: Pain Chronic (Persistent) (Comorbidity Management)  Goal: Acceptable Pain Control and Functional Ability  Intervention: Manage Persistent Pain  Recent Flowsheet Documentation  Taken 07/01/2023 0705 by Merdis Delay, RN STUDENT  Bowel Elimination Promotion: ambulation promoted     Problem: Breathing Pattern Ineffective  Goal: Effective Breathing Pattern  Intervention: Promote Improved Breathing Pattern  Recent Flowsheet Documentation  Taken 07/01/2023 0800 by Merdis Delay, RN STUDENT  Head of Bed [facility]) Positioning: HOB elevated  Taken 07/01/2023 0705 by Merdis Delay, RN STUDENT  Head of Bed [facility]) Positioning: HOB elevated

## 2023-07-01 NOTE — Nurses Notes (Signed)
Patient is resting in bed with call light in reach. Alert and oriented. Vitals stable this shift. Running normal sinus on tele. No complaints at this time. Patient remains free from falls this shift.    BP (!) 90/51   Pulse 72   Temp 36.4 C (97.6 F)   Resp 16   Ht 1.588 m (5' 2.5")   Wt (!) 35.5 kg (78 lb 4.2 oz)   LMP  (LMP Unknown)   SpO2 98%   BMI 14.09 kg/m     Murriel Hopper, LPN

## 2023-07-01 NOTE — Care Plan (Signed)
Patient alert and oriented x4. Patient has rested well today with no complaints. Patient had a soap suds enema performed that was effective. Patient has had 2 bowel movements since enema was performed. No complaints of pain at this time or SOB. Patient has been up walking with a stand by assist and walker in halls with aide and sitting in chair. Patient is on telemetry and has been running at normal sinus rhythm. Patient's family has been at bed side this evening and asking about any changes. Patient possible discharge on 07/02/2023.  Bed in lowest position, wheels locked, call bell and personal items within reach. Patient has been free from falls this shift.     Merdis Delay, LPN

## 2023-07-01 NOTE — Care Management Notes (Signed)
Referral Information  ++++++ Placed Provider #1 ++++++  Case Manager: Shannon McDaniel  Provider Type: Home Health  Provider Name: [facility]- [place],  [place], [place], [place], [place], [place], [place]  Address:  [address]  [place], [place] [address]  Contact: stephenie Hull    Phone: [phone] x  Fax:   Fax: [phone]

## 2023-07-01 NOTE — Care Plan (Signed)
Restorative Nursing Note      Patient ambulated 225 feet with rollater . Tolerated very well.     Aggie Moats, PAT CARE [facility]  07/01/2023, 14:49

## 2023-07-01 NOTE — Progress Notes (Signed)
Internal Medicine Progress Note    Names:  [name]  Date of service: 07/01/2023  Date of Admission:  06/29/2023  Hospital Day:  LOS: 2 days     Assessment/ Plan:   Active Hospital Problems    Diagnosis    CHF (congestive heart failure) (CMS HCC)       1. Acute on chronic systolic Congestive heart failure.  Echocardiogram noted .  Ejection fraction 35%.  Begin low-dose ACE inhibitor in low-dose beta-blocker.  Continue diuresis.      2. Large left pleural effusion.  Tolerated the thoracentesis well.  Some improvement symptoms.  So far appears to be a transudate    3. Severe protein calorie malnutrition.  Continue to encourage patient with p.o. intake and provide supplements.      4. Constipation.  Soapsuds enema.    PT/OT: Yes    Disposition Planning:   Home with home health possibly later this week    Subjective:  Some improvement shortness a breath.  No chest pain or palpitations.  Denies dizziness nausea vomiting.  No abdominal pain.  Passing flatus.  No bowel movement as of yet.  Voiding without difficulty.    acetaminophen (TYLENOL) tablet, 650 mg, Oral, Q6H PRN  aluminum-magnesium hydroxide-simethicone (MAG-AL PLUS) 200-200-20 mg per 5 mL oral liquid, 20 mL, Oral, Q4H PRN  carvedilol (COREG) tablet, 3.125 mg, Oral, 2x/day-Food  furosemide (LASIX) 10 mg/mL injection, 20 mg, Intravenous, Daily  lactulose (ENULOSE) 20g per 30mL oral liquid, 30 mL, Oral, 3x/day  levothyroxine (SYNTHROID) tablet, 75 mcg, Oral, QAM  lisinopril (PRINIVIL) tablet, 2.5 mg, Oral, Daily  metoclopramide (REGLAN) tablet, 5 mg, Oral, 3x/day AC  NS flush syringe, 10 mL, Intravenous, Give in Radiology  NS flush syringe, 3 mL, Intracatheter, Q8HRS  NS flush syringe, 3 mL, Intracatheter, Q1H PRN  ondansetron (ZOFRAN) 2 mg/mL injection, 4 mg, Intravenous, Q6H PRN  sennosides-docusate sodium (SENOKOT-S) 8.6-50mg  per tablet, 1 Tablet, Oral, 2x/day  simethicone (MYLICON) chewable tablet, 160 mg, Oral, 4x/day        Physical  Exam:    Vital Signs:  BP 112/61   Pulse 76   Temp 36.4 C (97.6 F)   Resp 18   Ht 1.588 m (5' 2.5")   Wt (!) 35.5 kg (78 lb 4.2 oz)   LMP  (LMP Unknown)   SpO2 95%   BMI 14.09 kg/m         I/O:  I/O last 24 hours:    Intake/Output Summary (Last 24 hours) at 07/01/2023 1329  Last data filed at 07/01/2023 1200  Gross per 24 hour   Intake 720 ml   Output --   Net 720 ml     I/O current shift:  12/18 0700 - 12/18 1859  In: 480 [P.O.:480]  Out: -   Blood Sugars:     General:  Alert.  Cooperative.  Difficulty hearing.  Cardio: Regular rate and rhythm. Normal S1 and S2. No murmurs, gallops, or rubs.   Resp: Clear to auscultation bilaterally. No wheezes, rales, rhonchi or crackles. Normal resp effort.  Abd: Bowel sounds present, Soft, non-tender.  No distention  Extremities:  Trace edema persists      Labs:     Results for orders placed or performed during the hospital encounter of 06/29/23 (from the past 24 hour(s))   BASIC METABOLIC PANEL   Result Value Ref Range    SODIUM 132 (L) 136 - 145 mmol/L    POTASSIUM 4.5 3.5 - 5.1 mmol/L  CHLORIDE 94 (L) 96 - 111 mmol/L    CO2 TOTAL 33 (H) 23 - 31 mmol/L    ANION GAP 5 4 - 13 mmol/L    CALCIUM 8.9 8.6 - 10.3 mg/dL    GLUCOSE 85 65 - 742 mg/dL    BUN 16 8 - 25 mg/dL    CREATININE 5.95 6.38 - 1.05 mg/dL    BUN/CREA RATIO 20 6 - 22    ESTIMATED GFR - FEMALE 70 >=60 mL/min/BSA   CBC   Result Value Ref Range    WBC 5.0 3.7 - 11.0 x10^3/uL    RBC 3.84 (L) 3.85 - 5.22 x10^6/uL    HGB 12.3 11.5 - 16.0 g/dL    HCT 75.6 43.3 - 29.5 %    MCV 95.1 78.0 - 100.0 fL    MCH 32.0 26.0 - 32.0 pg    MCHC 33.7 31.0 - 35.5 g/dL    RDW-CV 18.8 41.6 - 60.6 %    PLATELETS 218 150 - 400 x10^3/uL    MPV 9.2 8.7 - 12.5 fL   MAGNESIUM   Result Value Ref Range    MAGNESIUM 2.0 1.8 - 2.6 mg/dL   PHOSPHORUS   Result Value Ref Range    PHOSPHORUS 3.9 2.3 - 4.0 mg/dL       Micro:   Hospital Encounter on 06/29/23 (from the past 96 hour(s))   STERILE SITE CULTURE AND GRAM STAIN, AEROBIC    Collection  Time: 06/30/23  7:56 AM    Specimen: Pleural Fluid; Body fluid   Culture Result Status    FLC No Growth 18-24 hrs. Preliminary    GRAM STAIN 1+ Rare WBCs Preliminary    GRAM STAIN No Organisms Seen Preliminary       Radiology:           Marylee Floras, MD    07/01/2023   12:12  [facility]  [facility]

## 2023-07-01 NOTE — Nurses Notes (Signed)
 Currently this patient meets requirements for a low to mid level of nursing care.  The patient will continue to be monitored and re-evaluated for any changes.     Roselyn Bering, RN

## 2023-07-01 NOTE — Nurses Notes (Signed)
I have reviewed this patient's orders and plan of care. Currently this patient meets requirements for a low to mid level of nursing care.  Ryle Buscemi L Carder, RN

## 2023-07-01 NOTE — Care Management Notes (Signed)
[facility]  Care Management Note    Patient Name: [name]  Date of Birth: 10-Jan-1935  Sex: female  Date/Time of Admission: 06/29/2023  9:02 AM  Room/Bed: 4136/A  Payor: MEDICARE / Plan: MEDICARE PART A AND B / Product Type: Medicare /    LOS: 2 days   Primary Care Providers:  Marylee Floras, MD, MD (General)    Admitting Diagnosis:  CHF (congestive heart failure) (CMS HCC) [I50.9]    Assessment:   Spoke to patient regarding home health. Patient would like home health. Provided patient CarePort listing with CMS ratings for Home Health serving the [phone] zip code. Patient chose [facility] home [facility]. Order Pended. FOC signed and placed on chart. Referral sent.     Discharge Plan:  Home with Home Health (code 6)  Home with [facility]     The patient will continue to be evaluated for developing discharge needs.     Case Manager: Shelbie Hutching, SOCIAL WORKER  Phone: [phone]

## 2023-07-02 ENCOUNTER — Non-Acute Institutional Stay: Payer: Medicare Hospice

## 2023-07-02 ENCOUNTER — Other Ambulatory Visit: Payer: Self-pay

## 2023-07-02 LAB — BASIC METABOLIC PANEL
ANION GAP: 5 mmol/L (ref 4–13)
BUN/CREA RATIO: 20 (ref 6–22)
BUN: 17 mg/dL (ref 8–25)
CALCIUM: 8.8 mg/dL (ref 8.6–10.3)
CHLORIDE: 93 mmol/L — ABNORMAL LOW (ref 96–111)
CO2 TOTAL: 32 mmol/L — ABNORMAL HIGH (ref 23–31)
CREATININE: 0.84 mg/dL (ref 0.60–1.05)
ESTIMATED GFR - FEMALE: 68 mL/min/BSA (ref >=60)
GLUCOSE: 85 mg/dL (ref 65–125)
POTASSIUM: 4.2 mmol/L (ref 3.5–5.1)
SODIUM: 130 mmol/L — ABNORMAL LOW (ref 136–145)

## 2023-07-02 LAB — LAVENDER TOP TUBE

## 2023-07-02 LAB — MAGNESIUM: MAGNESIUM: 2 mg/dL (ref 1.8–2.6)

## 2023-07-02 MED ORDER — SENNOSIDES 8.6 MG-DOCUSATE SODIUM 50 MG TABLET
1.0000 | ORAL_TABLET | Freq: Two times a day (BID) | ORAL | 11 refills | Status: AC
Start: 2023-07-02 — End: 2024-06-26

## 2023-07-02 MED ORDER — FUROSEMIDE 20 MG TABLET
20.0000 mg | ORAL_TABLET | Freq: Every day | ORAL | 11 refills | Status: DC
Start: 2023-07-02 — End: 2023-07-17

## 2023-07-02 MED ORDER — LACTULOSE 20 GRAM/30 ML ORAL SOLUTION
30.0000 mL | Freq: Three times a day (TID) | ORAL | 3 refills | Status: AC
Start: 2023-07-02 — End: 2024-04-09

## 2023-07-02 MED ORDER — CARVEDILOL 3.125 MG TABLET
3.1250 mg | ORAL_TABLET | Freq: Two times a day (BID) | ORAL | 11 refills | Status: DC
Start: 2023-07-02 — End: 2023-08-13

## 2023-07-02 MED ORDER — LISINOPRIL 2.5 MG TABLET
2.5000 mg | ORAL_TABLET | Freq: Every day | ORAL | 11 refills | Status: AC
Start: 2023-07-03 — End: 2024-06-27

## 2023-07-02 NOTE — Care Plan (Signed)
Problem: Adult Inpatient Plan of Care  Goal: Plan of Care Review  07/02/2023 1224 by Alfonso Ellis, RN  Outcome: Ongoing (see interventions/notes)  07/02/2023 1030 by Alfonso Ellis, RN  Outcome: Ongoing (see interventions/notes)  Goal: Patient-Specific Goal (Individualized)  07/02/2023 1224 by Alfonso Ellis, RN  Outcome: Ongoing (see interventions/notes)  07/02/2023 1030 by Alfonso Ellis, RN  Outcome: Ongoing (see interventions/notes)  Goal: Absence of Hospital-Acquired Illness or Injury  07/02/2023 1224 by Alfonso Ellis, RN  Outcome: Ongoing (see interventions/notes)  07/02/2023 1030 by Alfonso Ellis, RN  Outcome: Ongoing (see interventions/notes)  Intervention: Prevent Skin Injury  Recent Flowsheet Documentation  Taken 07/02/2023 0839 by Alfonso Ellis, RN  Skin Protection:   adhesive use limited   tubing/devices free from skin contact  Intervention: Prevent and Manage VTE (Venous Thromboembolism) Risk  Recent Flowsheet Documentation  Taken 07/02/2023 0839 by Alfonso Ellis, RN  VTE Prevention/Management:   ambulation promoted   dorsiflexion/plantar flexion performed  Goal: Optimal Comfort and Wellbeing  07/02/2023 1224 by Alfonso Ellis, RN  Outcome: Ongoing (see interventions/notes)  07/02/2023 1030 by Alfonso Ellis, RN  Outcome: Ongoing (see interventions/notes)  Goal: Rounds/Family Conference  07/02/2023 1224 by Alfonso Ellis, RN  Outcome: Ongoing (see interventions/notes)  07/02/2023 1030 by Alfonso Ellis, RN  Outcome: Ongoing (see interventions/notes)     Problem: Health Knowledge, Opportunity to Enhance (Adult,Obstetrics,Pediatric)  Goal: Knowledgeable about Health Subject/Topic  Description: Patient will demonstrate the desired outcomes by discharge/transition of care.  07/02/2023 1224 by Alfonso Ellis, RN  Outcome: Ongoing (see interventions/notes)  07/02/2023 1030 by Alfonso Ellis, RN  Outcome: Ongoing (see interventions/notes)     Problem: Skin Injury Risk Increased  Goal: Skin Health and Integrity  07/02/2023 1224 by Alfonso Ellis, RN  Outcome: Ongoing (see  interventions/notes)  07/02/2023 1030 by Alfonso Ellis, RN  Outcome: Ongoing (see interventions/notes)  Intervention: Optimize Skin Protection  Recent Flowsheet Documentation  Taken 07/02/2023 0839 by Alfonso Ellis, RN  Pressure Reduction Techniques: Moisture, shear and nutrition are maximized  Pressure Reduction Devices: Repositioning wedges/pillows utilized  Skin Protection:   adhesive use limited   tubing/devices free from skin contact     Problem: Pain Chronic (Persistent) (Comorbidity Management)  Goal: Acceptable Pain Control and Functional Ability  07/02/2023 1224 by Alfonso Ellis, RN  Outcome: Ongoing (see interventions/notes)  07/02/2023 1030 by Alfonso Ellis, RN  Outcome: Ongoing (see interventions/notes)  Intervention: Manage Persistent Pain  Recent Flowsheet Documentation  Taken 07/02/2023 0839 by Alfonso Ellis, RN  Sleep/Rest Enhancement:   awakenings minimized   consistent schedule promoted     Problem: Breathing Pattern Ineffective  Goal: Effective Breathing Pattern  07/02/2023 1224 by Alfonso Ellis, RN  Outcome: Ongoing (see interventions/notes)  07/02/2023 1030 by Alfonso Ellis, RN  Outcome: Ongoing (see interventions/notes)

## 2023-07-02 NOTE — Nurses Notes (Signed)
Pt alert and oriented this shift. Vitals stable. On RA. No complaints voiced. Will continue to monitor.      [place] Akyla Vavrek,LPN

## 2023-07-02 NOTE — Nurses Notes (Signed)
I have reviewed this patient's orders and plan of care.  Currently this patient meets requirements for a low to mid level of nursing care.  Eevee Borbon, RN

## 2023-07-02 NOTE — Care Plan (Signed)
Problem: Adult Inpatient Plan of Care  Goal: Plan of Care Review  07/02/2023 1224 by Alfonso Ellis, RN  Outcome: Adequate for Discharge  07/02/2023 1224 by Alfonso Ellis, RN  Outcome: Ongoing (see interventions/notes)  07/02/2023 1030 by Alfonso Ellis, RN  Outcome: Ongoing (see interventions/notes)  Goal: Patient-Specific Goal (Individualized)  07/02/2023 1224 by Alfonso Ellis, RN  Outcome: Adequate for Discharge  07/02/2023 1224 by Alfonso Ellis, RN  Outcome: Ongoing (see interventions/notes)  07/02/2023 1030 by Alfonso Ellis, RN  Outcome: Ongoing (see interventions/notes)  Goal: Absence of Hospital-Acquired Illness or Injury  07/02/2023 1224 by Alfonso Ellis, RN  Outcome: Adequate for Discharge  07/02/2023 1224 by Alfonso Ellis, RN  Outcome: Ongoing (see interventions/notes)  07/02/2023 1030 by Alfonso Ellis, RN  Outcome: Ongoing (see interventions/notes)  Intervention: Prevent Skin Injury  Recent Flowsheet Documentation  Taken 07/02/2023 0839 by Alfonso Ellis, RN  Skin Protection:   adhesive use limited   tubing/devices free from skin contact  Intervention: Prevent and Manage VTE (Venous Thromboembolism) Risk  Recent Flowsheet Documentation  Taken 07/02/2023 0839 by Alfonso Ellis, RN  VTE Prevention/Management:   ambulation promoted   dorsiflexion/plantar flexion performed  Goal: Optimal Comfort and Wellbeing  07/02/2023 1224 by Alfonso Ellis, RN  Outcome: Adequate for Discharge  07/02/2023 1224 by Alfonso Ellis, RN  Outcome: Ongoing (see interventions/notes)  07/02/2023 1030 by Alfonso Ellis, RN  Outcome: Ongoing (see interventions/notes)  Goal: Rounds/Family Conference  07/02/2023 1224 by Alfonso Ellis, RN  Outcome: Adequate for Discharge  07/02/2023 1224 by Alfonso Ellis, RN  Outcome: Ongoing (see interventions/notes)  07/02/2023 1030 by Alfonso Ellis, RN  Outcome: Ongoing (see interventions/notes)     Problem: Health Knowledge, Opportunity to Enhance (Adult,Obstetrics,Pediatric)  Goal: Knowledgeable about Health Subject/Topic  Description: Patient will demonstrate the desired outcomes by  discharge/transition of care.  07/02/2023 1224 by Alfonso Ellis, RN  Outcome: Adequate for Discharge  07/02/2023 1224 by Alfonso Ellis, RN  Outcome: Ongoing (see interventions/notes)  07/02/2023 1030 by Alfonso Ellis, RN  Outcome: Ongoing (see interventions/notes)     Problem: Skin Injury Risk Increased  Goal: Skin Health and Integrity  07/02/2023 1224 by Alfonso Ellis, RN  Outcome: Adequate for Discharge  07/02/2023 1224 by Alfonso Ellis, RN  Outcome: Ongoing (see interventions/notes)  07/02/2023 1030 by Alfonso Ellis, RN  Outcome: Ongoing (see interventions/notes)  Intervention: Optimize Skin Protection  Recent Flowsheet Documentation  Taken 07/02/2023 0839 by Alfonso Ellis, RN  Pressure Reduction Techniques: Moisture, shear and nutrition are maximized  Pressure Reduction Devices: Repositioning wedges/pillows utilized  Skin Protection:   adhesive use limited   tubing/devices free from skin contact     Problem: Pain Chronic (Persistent) (Comorbidity Management)  Goal: Acceptable Pain Control and Functional Ability  07/02/2023 1224 by Alfonso Ellis, RN  Outcome: Adequate for Discharge  07/02/2023 1224 by Alfonso Ellis, RN  Outcome: Ongoing (see interventions/notes)  07/02/2023 1030 by Alfonso Ellis, RN  Outcome: Ongoing (see interventions/notes)  Intervention: Manage Persistent Pain  Recent Flowsheet Documentation  Taken 07/02/2023 0839 by Alfonso Ellis, RN  Sleep/Rest Enhancement:   awakenings minimized   consistent schedule promoted     Problem: Breathing Pattern Ineffective  Goal: Effective Breathing Pattern  07/02/2023 1224 by Alfonso Ellis, RN  Outcome: Adequate for Discharge  07/02/2023 1224 by Alfonso Ellis, RN  Outcome: Ongoing (see interventions/notes)  07/02/2023 1030 by Alfonso Ellis, RN  Outcome: Ongoing (see interventions/notes)

## 2023-07-02 NOTE — Care Plan (Signed)
Problem: Adult Inpatient Plan of Care  Goal: Plan of Care Review  Outcome: Ongoing (see interventions/notes)  Goal: Patient-Specific Goal (Individualized)  Outcome: Ongoing (see interventions/notes)  Goal: Absence of Hospital-Acquired Illness or Injury  Outcome: Ongoing (see interventions/notes)  Intervention: Prevent Skin Injury  Recent Flowsheet Documentation  Taken 07/02/2023 0839 by Alfonso Ellis, RN  Skin Protection:   adhesive use limited   tubing/devices free from skin contact  Intervention: Prevent and Manage VTE (Venous Thromboembolism) Risk  Recent Flowsheet Documentation  Taken 07/02/2023 0839 by Alfonso Ellis, RN  VTE Prevention/Management:   ambulation promoted   dorsiflexion/plantar flexion performed  Goal: Optimal Comfort and Wellbeing  Outcome: Ongoing (see interventions/notes)  Goal: Rounds/Family Conference  Outcome: Ongoing (see interventions/notes)     Problem: Health Knowledge, Opportunity to Enhance (Adult,Obstetrics,Pediatric)  Goal: Knowledgeable about Health Subject/Topic  Description: Patient will demonstrate the desired outcomes by discharge/transition of care.  Outcome: Ongoing (see interventions/notes)     Problem: Skin Injury Risk Increased  Goal: Skin Health and Integrity  Outcome: Ongoing (see interventions/notes)  Intervention: Optimize Skin Protection  Recent Flowsheet Documentation  Taken 07/02/2023 0839 by Alfonso Ellis, RN  Pressure Reduction Techniques: Moisture, shear and nutrition are maximized  Pressure Reduction Devices: Repositioning wedges/pillows utilized  Skin Protection:   adhesive use limited   tubing/devices free from skin contact     Problem: Pain Chronic (Persistent) (Comorbidity Management)  Goal: Acceptable Pain Control and Functional Ability  Outcome: Ongoing (see interventions/notes)  Intervention: Manage Persistent Pain  Recent Flowsheet Documentation  Taken 07/02/2023 0839 by Alfonso Ellis, RN  Sleep/Rest Enhancement:   awakenings minimized   consistent schedule promoted      Problem: Breathing Pattern Ineffective  Goal: Effective Breathing Pattern  Outcome: Ongoing (see interventions/notes)

## 2023-07-02 NOTE — Nursing Note (Signed)
Case conference scheduled

## 2023-07-02 NOTE — Discharge Summary (Signed)
DISCHARGE SUMMARY      Date of Service:  07/02/2023  Atiya, [name], 88 y.o. female  Date of Birth:  [birth year]  PCP: Marylee Floras, MD    ADMISSION DATE:  06/29/2023  DISCHARGE DATE:  07/02/2023    ATTENDING PHYSICIAN: Marylee Floras, MD  PRIMARY CARE PHYSICIAN: Marylee Floras, MD     PRIMARY ADMISSION DIAGNOSIS: <principal problem not specified>    DISCHARGE DIAGNOSES:     1. Acute on chronic congestive heart failure     2. Transudative left pleural effusion    3. Severe protein calorie malnutrition    4. Chronic constipation  Active Hospital Problems    Diagnosis Date Noted    CHF (congestive heart failure) (CMS HCC) [I50.9] 06/29/2023      Resolved Hospital Problems   No resolved problems to display.     Active Non-Hospital Problems    Diagnosis Date Noted    Constipation 04/01/2023    Fecal impaction of colon (CMS HCC) 02/05/2023    Protein-calorie malnutrition, unspecified severity (CMS HCC) 09/08/2022    Vitamin D deficiency 12/23/2016    High risk medication use 12/23/2016    HLD (hyperlipidemia) 12/03/2016    Hypothyroidism 06/30/2006        DISCHARGE MEDICATIONS:     Current Discharge Medication List        START taking these medications.        Details   carvediloL 3.125 mg Tablet  Commonly known as: COREG   3.125 mg, Oral, 2 TIMES DAILY WITH FOOD  Qty: 60 Tablet  Refills: 11     furosemide 20 mg Tablet  Commonly known as: LASIX   20 mg, Oral, DAILY  Qty: 30 Tablet  Refills: 11     lisinopriL 2.5 mg Tablet  Commonly known as: PRINIVIL  Start taking on: July 03, 2023   2.5 mg, Oral, DAILY  Qty: 30 Tablet  Refills: 11     sennosides-docusate sodium 8.6-50 mg Tablet  Commonly known as: SENOKOT-S   1 Tablet, Oral, 2 TIMES DAILY  Qty: 60 Tablet  Refills: 11            CONTINUE these medications which have CHANGED during your visit.        Details   lactulose 20 gram/30 mL Solution  Commonly known as: ENULOSE  What changed:   medication strength  when to take this   30 mL, Oral, 3 TIMES  DAILY  Qty: 8100 mL  Refills: 3            CONTINUE these medications - NO CHANGES were made during your visit.        Details   levothyroxine 75 mcg Tablet  Commonly known as: SYNTHROID   75 mcg, Oral, EVERY MORNING  Qty: 90 Tablet  Refills: 3     lidocaine 4 % Adhesive Patch, Medicated   Place 1 Patch on the skin Once a day  Qty: 30 Patch  Refills: 11     metoclopramide HCl 5 mg Tablet  Commonly known as: REGLAN   Take 1 Tablet (5 mg total) by mouth Three times daily before meals  Qty: 90 Tablet  Refills: 11     simethicone 80 mg Tablet, Chewable  Commonly known as: MYLICON   Chew and swallow 2 Tablets (160 mg total) by mouth Four times a day  Qty: 240 Tablet  Refills: 11              DISCHARGE  INSTRUCTIONS:      Refer to Home Health - [facility] - [facility] - [place]   Referral Type: Home Health   Requested Specialty: HOME HEALTH AGENCY   Number of Visits Requested: 999       REASON FOR HOSPITALIZATION AND HOSPITAL COURSE:  This is a 88 y.o. female shortness of breath.  Congestive heart failure.  Large left pleural effusion.  Thoracentesis performed.  Transudate.  Secondary to heart failure.  Ejection fraction 35% on echocardiogram.  Diuresed.  Low-dose beta-blocker and afterload reducers added.  Follow up in the office one-week after discharge for further evaluation.    Patient received counseling on severe protein calorie malnutrition.  Also on constipation.    PHYSICAL EXAM AT DISCHARGE:   Temperature: 36.5 C (97.7 F)  Heart Rate: 72  BP (Non-Invasive): (!) 108/51  Respiratory Rate: 18  SpO2: 97 %  General: no distress  Eyes: Conjunctiva clear., Sclera non-icteric.   Lungs: Breathing nonlabored, Clear to auscultation bilaterally.   Cardiovascular: regular rate and rhythm, S1, S2 normal, no murmur, click, rub or gallop  Abdomen: non-distended, Soft, non-tender, Bowel sounds normal  Extremities: No edema  Skin: Skin warm and dry      SIGNIFICANT LAB: Lab Results for Last 24 Hours:    Results for  orders placed or performed during the hospital encounter of 06/29/23 (from the past 24 hour(s))   BASIC METABOLIC PANEL   Result Value Ref Range    SODIUM 130 (L) 136 - 145 mmol/L    POTASSIUM 4.2 3.5 - 5.1 mmol/L    CHLORIDE 93 (L) 96 - 111 mmol/L    CO2 TOTAL 32 (H) 23 - 31 mmol/L    ANION GAP 5 4 - 13 mmol/L    CALCIUM 8.8 8.6 - 10.3 mg/dL    GLUCOSE 85 65 - 595 mg/dL    BUN 17 8 - 25 mg/dL    CREATININE 6.38 7.56 - 1.05 mg/dL    BUN/CREA RATIO 20 6 - 22    ESTIMATED GFR - FEMALE 68 >=60 mL/min/BSA   MAGNESIUM   Result Value Ref Range    MAGNESIUM 2.0 1.8 - 2.6 mg/dL   LAVENDER TOP TUBE   Result Value Ref Range    RAINBOW/EXTRA TUBE AUTO RESULT Yes        SIGNIFICANT RADIOLOGY:  Reviewed    CONSULTATIONS:  None    PROCEDURES PERFORMED:  None    CONDITION ON DISCHARGE: Alert, Oriented, and VS Stable    DISCHARGE DISPOSITION:  Home discharge     ISSUES FOR OUTPATIENT F/U:   None    I spent more than 30 minutes discharging this patient, including review of current and discharge medications, final assessment, diagnosis and treatment of the patient; discussion of hospital course and discharge instructions with the patient and/or family; and arrangements for follow-up care, including appointments, needed prescriptions, referrals, and/or preparation of discharge records.      cc: Primary Care Physician:  Marylee Floras, MD  [address]  [facility] [id number]     [id number] Physician:  No referring provider defined for this encounter.     Marylee Floras, MD12/19/202412:35

## 2023-07-03 ENCOUNTER — Telehealth (HOSPITAL_COMMUNITY): Payer: Self-pay | Admitting: Internal Medicine

## 2023-07-03 ENCOUNTER — Other Ambulatory Visit: Payer: Medicare Hospice

## 2023-07-03 LAB — STERILE SITE CULTURE AND GRAM STAIN, AEROBIC
FLC: NO GROWTH
GRAM STAIN: NONE SEEN

## 2023-07-03 LAB — CYTOPATHOLOGY, NON GYN

## 2023-07-03 NOTE — Telephone Encounter (Signed)
Transition of Care Contact Information  Discharge Date: 07/02/2023  Transition Facility Type--Hospital (Inpatient or Observation)  Facility [facility]  Interactive Contact(s): Completed or attempted contact indicated by Date/Time  First Attempt Call: 07/03/2023 11:57 AM  Second Attempted Contact: 07/03/2023 12:01 PM  Contact Method(s)-- Patient/Caregiver Telephone  Clinical Staff Name/Role who [place] Carbo RN  Transition Note:Unable to leave voicemail, call attempted 2 times, continuous ring, no VM pickup.  MyChart is pending  Milinda Pointer, RN

## 2023-07-06 ENCOUNTER — Ambulatory Visit: Payer: Self-pay

## 2023-07-07 ENCOUNTER — Ambulatory Visit: Payer: Medicare Hospice

## 2023-07-11 ENCOUNTER — Encounter (HOSPITAL_COMMUNITY): Payer: Self-pay

## 2023-07-11 ENCOUNTER — Inpatient Hospital Stay (HOSPITAL_COMMUNITY)
Admission: RE | Admit: 2023-07-11 | Discharge: 2023-07-11 | Disposition: A | Discharge status: Home / Self Care | Payer: Medicare Other | Source: Ambulatory Visit

## 2023-07-11 ENCOUNTER — Other Ambulatory Visit: Payer: Self-pay

## 2023-07-11 ENCOUNTER — Inpatient Hospital Stay (HOSPITAL_COMMUNITY): Payer: Medicare Other | Admitting: Internal Medicine

## 2023-07-11 ENCOUNTER — Inpatient Hospital Stay
Admission: EM | Admit: 2023-07-11 | Discharge: 2023-07-17 | DRG: 291 | Disposition: A | Discharge status: Home / Self Care | Payer: Medicare Other | Attending: Internal Medicine | Admitting: Internal Medicine

## 2023-07-11 DIAGNOSIS — J9601 Acute respiratory failure with hypoxia: Secondary | ICD-10-CM | POA: Diagnosis not present

## 2023-07-11 DIAGNOSIS — I5023 Acute on chronic systolic (congestive) heart failure: Principal | ICD-10-CM | POA: Diagnosis present

## 2023-07-11 DIAGNOSIS — M797 Fibromyalgia: Secondary | ICD-10-CM | POA: Diagnosis present

## 2023-07-11 DIAGNOSIS — Z79899 Other long term (current) drug therapy: Secondary | ICD-10-CM

## 2023-07-11 DIAGNOSIS — E782 Mixed hyperlipidemia: Secondary | ICD-10-CM | POA: Diagnosis present

## 2023-07-11 DIAGNOSIS — R627 Adult failure to thrive: Secondary | ICD-10-CM | POA: Diagnosis present

## 2023-07-11 DIAGNOSIS — Z79890 Hormone replacement therapy (postmenopausal): Secondary | ICD-10-CM

## 2023-07-11 DIAGNOSIS — E43 Unspecified severe protein-calorie malnutrition: Secondary | ICD-10-CM | POA: Diagnosis present

## 2023-07-11 DIAGNOSIS — E039 Hypothyroidism, unspecified: Secondary | ICD-10-CM | POA: Diagnosis present

## 2023-07-11 DIAGNOSIS — Z681 Body mass index (BMI) 19 or less, adult: Secondary | ICD-10-CM

## 2023-07-11 DIAGNOSIS — I255 Ischemic cardiomyopathy: Secondary | ICD-10-CM | POA: Diagnosis present

## 2023-07-11 DIAGNOSIS — I959 Hypotension, unspecified: Secondary | ICD-10-CM | POA: Diagnosis present

## 2023-07-11 DIAGNOSIS — Z66 Do not resuscitate: Secondary | ICD-10-CM | POA: Diagnosis not present

## 2023-07-11 DIAGNOSIS — I509 Heart failure, unspecified: Secondary | ICD-10-CM

## 2023-07-11 DIAGNOSIS — R0600 Dyspnea, unspecified: Principal | ICD-10-CM

## 2023-07-11 DIAGNOSIS — I420 Dilated cardiomyopathy: Secondary | ICD-10-CM | POA: Diagnosis present

## 2023-07-11 DIAGNOSIS — Z7982 Long term (current) use of aspirin: Secondary | ICD-10-CM

## 2023-07-11 DIAGNOSIS — K5909 Other constipation: Secondary | ICD-10-CM | POA: Diagnosis present

## 2023-07-11 DIAGNOSIS — J90 Pleural effusion, not elsewhere classified: Secondary | ICD-10-CM | POA: Diagnosis present

## 2023-07-11 LAB — B-TYPE NATRIURETIC PEPTIDE (BNP),PLASMA: BNP: 115 pg/mL — ABNORMAL HIGH (ref 0–100)

## 2023-07-11 LAB — EXTENDED RESPIRATORY VIRUS PANEL

## 2023-07-11 LAB — CBC WITH DIFF
BASOPHIL #: <0.1 10*3/uL (ref <=0.20)
BASOPHIL %: 0.9 %
EOSINOPHIL #: <0.1 10*3/uL (ref <=0.50)
EOSINOPHIL %: 0.9 %
HCT: 35.1 % (ref 34.8–46.0)
HGB: 12.2 g/dL (ref 11.5–16.0)
IMMATURE GRANULOCYTE #: <0.1 10*3/uL (ref <0.10)
IMMATURE GRANULOCYTE %: 0.3 % (ref 0.0–1.0)
LYMPHOCYTE #: 1.33 10*3/uL (ref 1.00–4.80)
LYMPHOCYTE %: 20.8 %
MCH: 32.9 pg — ABNORMAL HIGH (ref 26.0–32.0)
MCHC: 34.8 g/dL (ref 31.0–35.5)
MCV: 94.6 fL (ref 78.0–100.0)
MONOCYTE #: 0.77 10*3/uL (ref 0.20–1.10)
MONOCYTE %: 12.1 %
MPV: 9.6 fL (ref 8.7–12.5)
NEUTROPHIL #: 4.14 10*3/uL (ref 1.50–7.70)
NEUTROPHIL %: 65 %
PLATELETS: 232 10*3/uL (ref 150–400)
RBC: 3.71 10*6/uL — ABNORMAL LOW (ref 3.85–5.22)
RDW-CV: 13.1 % (ref 11.5–15.5)
WBC: 6.4 10*3/uL (ref 3.7–11.0)

## 2023-07-11 LAB — BASIC METABOLIC PANEL
ANION GAP: 7 mmol/L (ref 4–13)
BUN/CREA RATIO: 17 (ref 6–22)
BUN: 14 mg/dL (ref 8–25)
CALCIUM: 8.8 mg/dL (ref 8.6–10.3)
CHLORIDE: 101 mmol/L (ref 96–111)
CO2 TOTAL: 29 mmol/L (ref 23–31)
CREATININE: 0.84 mg/dL (ref 0.60–1.05)
ESTIMATED GFR - FEMALE: 68 mL/min/BSA (ref >=60)
GLUCOSE: 80 mg/dL (ref 65–125)
POTASSIUM: 4.3 mmol/L (ref 3.5–5.1)
SODIUM: 137 mmol/L (ref 136–145)

## 2023-07-11 LAB — BLOOD GAS
%FIO2 (VENOUS): 21 %
BASE EXCESS: 6.2 mmol/L — ABNORMAL HIGH (ref 0.0–3.0)
BICARBONATE (VENOUS): 29 mmol/L (ref 22.0–29.0)
O2 SATURATION (VENOUS): 68.3 % (ref 40.0–85.0)
PCO2 (VENOUS): 54 mm[Hg] — ABNORMAL HIGH (ref 41–51)
PH (VENOUS): 7.39 (ref 7.32–7.43)
PO2 (VENOUS): 36 mm[Hg] (ref 35–50)

## 2023-07-11 LAB — PT/INR: INR: 1.07 (ref 0.80–1.10)

## 2023-07-11 NOTE — ED Provider Notes (Signed)
Advanced Practice Provider: Francia Greaves  Supervising Attending: Dr. Doren Custard    Chief Complaint:  Chief Complaint   Patient presents with   . Shortness of Breath     Patient brought in by daughter with complaints of feeling like she was being smothered and can't breath, was here 12/16 with same problems        History of Present Illness:  [name] is a 88 y.o. female with a PMH of fibromyalgia who presents via pov with a chief complaint of shortness of breath. Daughter at bedside. She reports she developed shortness of breath like she did last time when admitted for CHF. Reports she was placed on diuretics which she has been taking. Noticed ankles swollen today. Keeps legs elevated. No chest pain. No fevers. No other complaints.    History Limitations: none       Allergies:  Allergies   Allergen Reactions   . Lactose Rash   . Grass Pollen      Past Medical History:  Past Medical History:   Diagnosis Date   . Fibromyalgia    . Hypercholesterolemia    . Thyroid disease          Past Surgical History:  Past Surgical History:   Procedure Laterality Date   . HX BREAST LUMPECTOMY Right          Social History:  Social History     Tobacco Use   . Smoking status: Never   . Smokeless tobacco: Never   Substance Use Topics   . Alcohol use: Never     Family History:  Family Medical History:       Problem Relation (Age of Onset)    Hypertension (High Blood Pressure) Mother, Father            Physical Exam:  All nurse's notes reviewed.  Filed Vitals:    07/11/23 2118   BP: 137/68   Pulse: 81   Resp: 20   Temp: 36.9 C (98.4 F)   SpO2: 97%       Constitutional: No acute distress.  Alert and Oriented x3. Cachetic   HENT:   Head: Normocephalic, Atraumatic  Mouth/Throat: Oropharynx is clear and moist.  Eyes:Conjunctivae without discharge.  Neck: Trachea midline.   Cardiovascular: Regular rate and rhythm  Pulmonary/Chest: Breath sounds equal bilaterally, good air movement. No respiratory distress. No wheezes, rales or chest  tenderness.  Abdominal: Normal Bowel Sounds. Abdomen soft, no tenderness, no rebound or guarding.  Musculoskeletal: No obvious deformity. 2+ peripheral pulses. Bilateral lower extremity edema.  Skin: Warm and dry. No rash, erythema, pallor or cyanosis.  Psychiatric: Behavior is normal. Mood and affect congruent.  Neurological: Alert & Oriented x3. Grossly intact. Moves all extremities spontaneously.    Orders, Abnormal Labs and Imaging Results:  Results up to the Time the Disposition was Entered   BLOOD GAS - Abnormal; Notable for the following components:       Result Value    PCO2 (VENOUS) 54 (*)     BASE EXCESS 6.2 (*)     All other components within normal limits    Narrative:     Manufacturer does not recommend venous sample for assessment of patient oxygenation status. A reference range for pO2, Oxyhemoglobin and Oxygen Saturation is provided but abnormal results will not flag in Epic.   EXTENDED RESPIRATORY VIRUS PANEL - Normal   ECG 12 LEAD - ED USE    Narrative:     Sinus rhythm with 1st  degree AV block  Left axis deviation  Incomplete right bundle branch block  Left ventricular hypertrophy with repolarization abnormality ( R in aVL , Cornell product )  Inferior infarct , age undetermined  Anterior infarct (cited on or before 22-May-2023)  Abnormal ECG  When compared with ECG of 29-Jun-2023 08:30,  fusion complexes are no longer present  PR interval has increased  Incomplete right bundle branch block is now present  Inferior infarct is now present   XR AP MOBILE CHEST    Narrative:     [name]    PROCEDURE DESCRIPTION: XR AP MOBILE CHEST    CLINICAL INDICATION: Respiratory problem    TECHNIQUE: 1 views / 1 images submitted.    COMPARISON: Chest x-ray dated June 30, 2023      FINDINGS: The heart is enlarged. Stable scarring at both lung apices noted. Persistent left basilar opacity consistent with pleural fluid. Severe degeneration of the shoulders and spine noted.     CBC/DIFF    Narrative:      The following orders were created for panel order CBC/DIFF.  Procedure                               Abnormality         Status                     ---------                               -----------         ------                     CBC WITH YNWG[[id number]]                                                                 Please view results for these tests on the individual orders.   BASIC METABOLIC PANEL   TROPONIN-I   PT/INR   CBC WITH DIFF   B-TYPE NATRIURETIC PEPTIDE (BNP),PLASMA   DIET NPO NOW   CONTINUOUS CARDIAC MONITORING (ED USE ONLY)   PULSE OXIMETRY       EKG: If performed, reviewed with attending physician.    MDM:   MDM  ED Course as of 07/11/23 2359   Sat Jul 11, 2023   2359 B-TYPE NATRIURETIC PEPTIDE(!): 115       Consults: ***  Impression:   Clinical Impression   None     Disposition:  Data Unavailable    Admission: Patient will be admitted to *** service for further evaluation and management.    Sign-out: Care of patient will be transferred to Dr. Marland Kitchen at this time.  They were made aware of history/physical, relevant labs/imaging and pending studies.    Discharge: Following the above history, physical exam, and studies, the patient was deemed stable and suitable for discharge.  It was advised that the patient return to the ED if they develop new or any other concerning symptoms and follow up as directed.   The patient's family verbalized understanding of all instructions and had no further questions or concerns.  Follow Up:   No follow-up provider specified.  Prescriptions:      Current Discharge Medication List        CONTINUE these medications which have CHANGED during your visit.        Details   lactulose 20 gram/30 mL Solution  Commonly known as: ENULOSE  What changed: Another medication with the same name was removed. Continue taking this medication, and follow the directions you see here.   30 mL, Oral, 3 TIMES DAILY  Qty: 8100 mL  Refills: 3            CONTINUE these medications - NO  CHANGES were made during your visit.        Details   carvediloL 3.125 mg Tablet  Commonly known as: COREG   3.125 mg, Oral, 2 TIMES DAILY WITH FOOD  Qty: 60 Tablet  Refills: 11     furosemide 20 mg Tablet  Commonly known as: LASIX   20 mg, Oral, DAILY  Qty: 30 Tablet  Refills: 11     levothyroxine 75 mcg Tablet  Commonly known as: SYNTHROID   75 mcg, Oral, EVERY MORNING  Qty: 90 Tablet  Refills: 3     lidocaine 4 % Adhesive Patch, Medicated   Place 1 Patch on the skin Once a day  Qty: 30 Patch  Refills: 11     lisinopriL 2.5 mg Tablet  Commonly known as: PRINIVIL   2.5 mg, Oral, DAILY  Qty: 30 Tablet  Refills: 11     metoclopramide HCl 5 mg Tablet  Commonly known as: REGLAN   Take 1 Tablet (5 mg total) by mouth Three times daily before meals  Qty: 90 Tablet  Refills: 11     sennosides-docusate sodium 8.6-50 mg Tablet  Commonly known as: SENOKOT-S   1 Tablet, Oral, 2 TIMES DAILY  Qty: 60 Tablet  Refills: 11     simethicone 80 mg Tablet, Chewable  Commonly known as: MYLICON   Chew and swallow 2 Tablets (160 mg total) by mouth Four times a day  Qty: 240 Tablet  Refills: 11                 Future Appointments   Date Time Provider Department Center   07/20/2023 11:45 AM Joslyn Devon, MD [facility]   03/10/2024 10:00 AM Angotti, Inocente Salles, MD [facility]       Patient seen independently with co-signing physician present in clinic.       Francia Greaves, APRN,FNP-BC  07/11/2023, 23:21

## 2023-07-12 ENCOUNTER — Encounter (HOSPITAL_COMMUNITY): Payer: Self-pay | Admitting: Internal Medicine

## 2023-07-12 DIAGNOSIS — I440 Atrioventricular block, first degree: Secondary | ICD-10-CM

## 2023-07-12 DIAGNOSIS — I4510 Unspecified right bundle-branch block: Secondary | ICD-10-CM

## 2023-07-12 DIAGNOSIS — I517 Cardiomegaly: Secondary | ICD-10-CM

## 2023-07-12 DIAGNOSIS — R9431 Abnormal electrocardiogram [ECG] [EKG]: Secondary | ICD-10-CM

## 2023-07-12 DIAGNOSIS — E782 Mixed hyperlipidemia: Secondary | ICD-10-CM

## 2023-07-12 DIAGNOSIS — I252 Old myocardial infarction: Secondary | ICD-10-CM

## 2023-07-12 DIAGNOSIS — J90 Pleural effusion, not elsewhere classified: Secondary | ICD-10-CM | POA: Diagnosis present

## 2023-07-12 LAB — ECG 12 LEAD - ED USE
Atrial Rate: 82 {beats}/min
Calculated P Axis: 45 degrees
Calculated R Axis: -37 degrees
Calculated T Axis: 138 degrees
PR Interval: 240 ms
QRS Duration: 108 ms
QT Interval: 410 ms
QTC Calculation: 479 ms
Ventricular rate: 82 {beats}/min

## 2023-07-12 LAB — ECG 12 LEAD - ADULT
Atrial Rate: 79 {beats}/min
Calculated P Axis: 24 degrees
Calculated R Axis: -34 degrees
Calculated T Axis: 157 degrees
PR Interval: 208 ms
QRS Duration: 126 ms
QT Interval: 468 ms
QTC Calculation: 536 ms
Ventricular rate: 79 {beats}/min

## 2023-07-12 LAB — TROPONIN-I
TROPONIN-I HS: 4.2 ng/L (ref <=14.0)
TROPONIN-I HS: 3.8 ng/L (ref <=14.0)
TROPONIN-I HS: 3.3 ng/L (ref <=14.0)
TROPONIN-I HS: 8.9 ng/L (ref <=14.0)

## 2023-07-12 LAB — B-TYPE NATRIURETIC PEPTIDE (BNP),PLASMA: BNP: 112 pg/mL — ABNORMAL HIGH (ref 0–100)

## 2023-07-12 MED ORDER — ACETAMINOPHEN 325 MG TABLET
650.0000 mg | ORAL_TABLET | Freq: Four times a day (QID) | ORAL | Status: DC | PRN
Start: 2023-07-12 — End: 2023-07-17

## 2023-07-12 MED ORDER — METOCLOPRAMIDE 5 MG TABLET
5.0000 mg | ORAL_TABLET | Freq: Three times a day (TID) | ORAL | Status: DC
Start: 2023-07-12 — End: 2023-07-17
  Administered 2023-07-12 – 2023-07-13 (×4): 5 mg via ORAL
  Administered 2023-07-13 (×2): 0 mg via ORAL
  Administered 2023-07-14 – 2023-07-17 (×11): 5 mg via ORAL
  Filled 2023-07-12 (×15): qty 1

## 2023-07-12 MED ORDER — FUROSEMIDE 10 MG/ML INJECTION SOLUTION
40.0000 mg | Freq: Two times a day (BID) | INTRAMUSCULAR | Status: DC
Start: 2023-07-12 — End: 2023-07-17
  Administered 2023-07-12 (×2): 40 mg via INTRAVENOUS
  Administered 2023-07-13: 0 mg via INTRAVENOUS
  Administered 2023-07-13 – 2023-07-17 (×8): 40 mg via INTRAVENOUS
  Filled 2023-07-12 (×10): qty 4

## 2023-07-12 MED ORDER — SENNOSIDES 8.6 MG-DOCUSATE SODIUM 50 MG TABLET
1.0000 | ORAL_TABLET | Freq: Two times a day (BID) | ORAL | Status: DC
Start: 2023-07-12 — End: 2023-07-17
  Administered 2023-07-12 – 2023-07-17 (×11): 1 via ORAL
  Filled 2023-07-12 (×12): qty 1

## 2023-07-12 MED ORDER — SODIUM CHLORIDE 0.9 % (FLUSH) INJECTION SYRINGE
3.0000 mL | INJECTION | Freq: Three times a day (TID) | INTRAMUSCULAR | Status: DC
Start: 2023-07-12 — End: 2023-07-17
  Administered 2023-07-12 (×2): 3 mL
  Administered 2023-07-12 – 2023-07-13 (×2): 0 mL
  Administered 2023-07-13: 3 mL
  Administered 2023-07-13: 0 mL
  Administered 2023-07-14 – 2023-07-15 (×4): 3 mL
  Administered 2023-07-15: 0 mL
  Administered 2023-07-15 – 2023-07-16 (×2): 3 mL
  Administered 2023-07-16 – 2023-07-17 (×4): 0 mL

## 2023-07-12 MED ORDER — LISINOPRIL 5 MG TABLET
2.5000 mg | ORAL_TABLET | Freq: Every day | ORAL | Status: DC
Start: 2023-07-12 — End: 2023-07-12

## 2023-07-12 MED ORDER — ALUMINUM-MAG HYDROXIDE-SIMETHICONE 200 MG-200 MG-20 MG/5 ML ORAL SUSP
30.0000 mL | ORAL | Status: DC | PRN
Start: 2023-07-12 — End: 2023-07-17

## 2023-07-12 MED ORDER — ONDANSETRON HCL (PF) 4 MG/2 ML INJECTION SOLUTION
4.0000 mg | Freq: Four times a day (QID) | INTRAMUSCULAR | Status: DC | PRN
Start: 2023-07-12 — End: 2023-07-17

## 2023-07-12 MED ORDER — LISINOPRIL 5 MG TABLET
5.0000 mg | ORAL_TABLET | Freq: Every day | ORAL | Status: DC
Start: 2023-07-12 — End: 2023-07-17
  Administered 2023-07-12 – 2023-07-17 (×6): 5 mg via ORAL
  Filled 2023-07-12 (×6): qty 1

## 2023-07-12 MED ORDER — SODIUM CHLORIDE 0.9 % (FLUSH) INJECTION SYRINGE
3.0000 mL | INJECTION | INTRAMUSCULAR | Status: DC | PRN
Start: 2023-07-12 — End: 2023-07-17

## 2023-07-12 MED ORDER — MAGNESIUM HYDROXIDE 400 MG/5 ML ORAL SUSPENSION
15.0000 mL | Freq: Four times a day (QID) | ORAL | Status: DC | PRN
Start: 2023-07-12 — End: 2023-07-17
  Administered 2023-07-13: 1200 mg via ORAL
  Filled 2023-07-12: qty 30

## 2023-07-12 MED ORDER — LEVOTHYROXINE 75 MCG TABLET
75.0000 ug | ORAL_TABLET | Freq: Every morning | ORAL | Status: DC
Start: 2023-07-12 — End: 2023-07-17
  Administered 2023-07-12: 75 ug via ORAL
  Administered 2023-07-13: 0 ug via ORAL
  Administered 2023-07-14 – 2023-07-17 (×4): 75 ug via ORAL
  Filled 2023-07-12 (×5): qty 1

## 2023-07-12 MED ORDER — SIMETHICONE 80 MG CHEWABLE TABLET
160.0000 mg | CHEWABLE_TABLET | Freq: Four times a day (QID) | ORAL | Status: DC
Start: 2023-07-12 — End: 2023-07-17
  Administered 2023-07-12 (×4): 160 mg via ORAL
  Administered 2023-07-13 (×2): 0 mg via ORAL
  Administered 2023-07-13 – 2023-07-17 (×16): 160 mg via ORAL
  Filled 2023-07-12 (×20): qty 2

## 2023-07-12 MED ORDER — ASPIRIN 81 MG TABLET,DELAYED RELEASE
81.0000 mg | DELAYED_RELEASE_TABLET | Freq: Every day | ORAL | Status: DC
Start: 2023-07-12 — End: 2023-07-17
  Administered 2023-07-12 – 2023-07-17 (×6): 81 mg via ORAL
  Filled 2023-07-12 (×6): qty 1

## 2023-07-12 MED ORDER — ATORVASTATIN 40 MG TABLET
40.0000 mg | ORAL_TABLET | Freq: Every evening | ORAL | Status: DC
Start: 2023-07-12 — End: 2023-07-17
  Administered 2023-07-12 – 2023-07-16 (×5): 40 mg via ORAL
  Filled 2023-07-12 (×5): qty 1

## 2023-07-12 MED ORDER — DOCUSATE SODIUM 100 MG CAPSULE
100.0000 mg | ORAL_CAPSULE | Freq: Two times a day (BID) | ORAL | Status: DC | PRN
Start: 2023-07-12 — End: 2023-07-17

## 2023-07-12 MED ORDER — PANTOPRAZOLE 40 MG TABLET,DELAYED RELEASE
40.0000 mg | DELAYED_RELEASE_TABLET | Freq: Every morning | ORAL | Status: DC
Start: 2023-07-12 — End: 2023-07-17
  Administered 2023-07-12: 40 mg via ORAL
  Administered 2023-07-13: 0 mg via ORAL
  Administered 2023-07-14 – 2023-07-17 (×4): 40 mg via ORAL
  Filled 2023-07-12 (×5): qty 1

## 2023-07-12 MED ORDER — CARVEDILOL 3.125 MG TABLET
3.1250 mg | ORAL_TABLET | Freq: Two times a day (BID) | ORAL | Status: DC
Start: 2023-07-12 — End: 2023-07-17
  Administered 2023-07-12: 0 mg via ORAL
  Administered 2023-07-12: 3.125 mg via ORAL
  Administered 2023-07-13: 0 mg via ORAL
  Administered 2023-07-13 – 2023-07-17 (×8): 3.125 mg via ORAL
  Filled 2023-07-12 (×11): qty 1

## 2023-07-12 MED ORDER — ENOXAPARIN 30 MG/0.3 ML SUBCUTANEOUS SYRINGE
30.0000 mg | INJECTION | SUBCUTANEOUS | Status: DC
Start: 2023-07-12 — End: 2023-07-17
  Administered 2023-07-12: 30 mg via SUBCUTANEOUS
  Administered 2023-07-13 – 2023-07-14 (×2): 0 mg via SUBCUTANEOUS
  Administered 2023-07-15 – 2023-07-17 (×3): 30 mg via SUBCUTANEOUS
  Filled 2023-07-12 (×6): qty 0.3

## 2023-07-12 MED ORDER — LACTULOSE 20 GRAM/30 ML ORAL SOLUTION
30.0000 mL | Freq: Three times a day (TID) | ORAL | Status: DC
Start: 2023-07-12 — End: 2023-07-17
  Administered 2023-07-12: 0 mL via ORAL
  Administered 2023-07-12 – 2023-07-13 (×4): 30 mL via ORAL
  Administered 2023-07-13: 0 mL via ORAL
  Administered 2023-07-14: 30 mL via ORAL
  Administered 2023-07-14: 0 mL via ORAL
  Administered 2023-07-14 – 2023-07-15 (×2): 30 mL via ORAL
  Administered 2023-07-15: 0 mL via ORAL
  Administered 2023-07-15: 30 mL via ORAL
  Administered 2023-07-16: 0 mL via ORAL
  Administered 2023-07-16 (×2): 30 mL via ORAL
  Administered 2023-07-17: 0 mL via ORAL
  Administered 2023-07-17: 30 mL via ORAL
  Filled 2023-07-12 (×14): qty 30

## 2023-07-12 NOTE — ED Nurses Note (Signed)
Warm blankets provided. Daughter remains at bedside.

## 2023-07-12 NOTE — ED Nurses Note (Signed)
 Report called to [facility] RN on 4N.

## 2023-07-12 NOTE — Care Plan (Signed)
Problem: Health Knowledge, Opportunity to Enhance (Adult,Obstetrics,Pediatric)  Goal: Knowledgeable about Health Subject/Topic  Description: Patient will demonstrate the desired outcomes by discharge/transition of care.  Outcome: Ongoing (see interventions/notes)     Problem: ARDS (Acute Respiratory Distress Syndrome)  Goal: Effective Oxygenation  Outcome: Ongoing (see interventions/notes)     Problem: Pain Acute  Goal: Optimal Pain Control and Function  Outcome: Ongoing (see interventions/notes)     Problem: Fall Injury Risk  Goal: Absence of Fall and Fall-Related Injury  Outcome: Ongoing (see interventions/notes)     Problem: Adult Inpatient Plan of Care  Goal: Plan of Care Review  Outcome: Ongoing (see interventions/notes)  Goal: Patient-Specific Goal (Individualized)  Outcome: Ongoing (see interventions/notes)  Goal: Absence of Hospital-Acquired Illness or Injury  Outcome: Ongoing (see interventions/notes)  Goal: Optimal Comfort and Wellbeing  Outcome: Ongoing (see interventions/notes)  Goal: Rounds/Family Conference  Outcome: Ongoing (see interventions/notes)     Problem: Skin Injury Risk Increased  Goal: Skin Health and Integrity  Outcome: Ongoing (see interventions/notes)     Problem: Heart Failure  Goal: Optimal Coping  Outcome: Ongoing (see interventions/notes)  Goal: Optimal Cardiac Output and Blood Flow  Outcome: Ongoing (see interventions/notes)  Goal: Stable Heart Rate and Rhythm  Outcome: Ongoing (see interventions/notes)  Goal: Fluid and Electrolyte Balance  Outcome: Ongoing (see interventions/notes)  Goal: Optimal Functional Ability  Outcome: Ongoing (see interventions/notes)  Goal: Improved Oral Intake  Outcome: Ongoing (see interventions/notes)  Goal: Effective Oxygenation and Ventilation  Outcome: Ongoing (see interventions/notes)  Goal: Effective Breathing Pattern During Sleep  Outcome: Ongoing (see interventions/notes)     Problem: Gas Exchange Impaired  Goal: Optimal Gas Exchange  Outcome:  Ongoing (see interventions/notes)

## 2023-07-12 NOTE — Nurses Notes (Signed)
Patient alert and oriented free from falls. Patient on room air. IV intact and patnet. I have reviewed this patient's orders and plan of care.  Currently this patient meets requirements for a mid to high level of nursing care.  The patient will continue to be monitored and reevaluated for any changes.  Orvis Brill., RN

## 2023-07-12 NOTE — Progress Notes (Addendum)
[facility]  Cardiology   Progress Note      [name], [name]  Date of Admission:  07/11/2023  Date of service:  07/13/2023  Date of Birth:  [birth year]  MRN:  [id number]    Hospital Day:  LOS: 0 days     Assessment/ Plan:  Acute on chronic systolic congestive heart failure continue Lasix intravenously  Small left pleural effusion status post thoracentesis  Severe protein and calorie malnutrition encouraging to take Ensure improved  Chronic constipation stable  Hypothyroid   Hypotension  Patient is DNR right now  Failure to thrive  Active Hospital Problems    Diagnosis    Primary Problem: Pleural effusion         Subjective  The patient had no chest pain  Still have sub dyspnea  Patient otherwise still not eating well    Current Medications:  acetaminophen (TYLENOL) tablet, 650 mg, Oral, Q6H PRN  aluminum-magnesium hydroxide-simethicone (MAG-AL PLUS) 200-200-20 mg per 5 mL oral liquid, 30 mL, Oral, Q4H PRN  aspirin (ECOTRIN) enteric coated tablet 81 mg, 81 mg, Oral, Daily  atorvastatin (LIPITOR) tablet, 40 mg, Oral, QPM  carvedilol (COREG) tablet, 3.125 mg, Oral, 2x/day-Food  docusate sodium (COLACE) capsule, 100 mg, Oral, 2x/day PRN  enoxaparin PF (LOVENOX) 30 mg/0.3 mL SubQ injection, 30 mg, Subcutaneous, Q24H  furosemide (LASIX) 10 mg/mL injection, 40 mg, Intravenous, 2x/day  lactulose (ENULOSE) 20g per 30mL oral liquid, 30 mL, Oral, 3x/day  levothyroxine (SYNTHROID) tablet, 75 mcg, Oral, QAM  lisinopril (PRINIVIL) tablet, 5 mg, Oral, Daily  magnesium hydroxide (MILK OF MAGNESIA) 400mg  per 5mL oral liquid, 15 mL, Oral, 4x/day PRN  metoclopramide (REGLAN) tablet, 5 mg, Oral, 3x/day AC  NS flush syringe, 3 mL, Intracatheter, Q8HRS  NS flush syringe, 3 mL, Intracatheter, Q1H PRN  ondansetron (ZOFRAN) 2 mg/mL injection, 4 mg, Intravenous, Q6H PRN  pantoprazole (PROTONIX) delayed release tablet, 40 mg, Oral, Daily before Breakfast  sennosides-docusate sodium (SENOKOT-S) 8.6-50mg  per tablet, 1 Tablet, Oral,  2x/day  simethicone (MYLICON) chewable tablet, 160 mg, Oral, 4x/day AC      Allergies   Allergen Reactions    Lactose Rash    Grass Pollen              Review system  Otherwise negative    Vital Signs:  All vital signs reviewed    Physical exam  Patient alert oriented x3 not in acute distress  HEENT unremarkable  Neck no JVD or bruit carotid upstroke within normal limit neck is supple  Lungs clear no rales rhonchi or wheezing  Heart Regular rhythm no gallop, rub or murmur     Abdomen soft nontender no masses or organomegaly, bowel sounds positive, no masses.  Extremities no cyanosis, clubbing   One point edema  Skin no rash.  Neuro grossly within normal limit no focal finding      Temp (24hrs) Max:36.9 C (98.4 F)    Temperature: 36.5 C (97.7 F) (07/12/23 1127)  BP (Non-Invasive): (!) 90/51 (07/12/23 1127)  MAP (Non-Invasive): (!) 59 mmHG (07/12/23 1127)  Heart Rate: 89 (07/12/23 1127)  Respiratory Rate: 18 (07/12/23 1127)  SpO2: 94 % (07/12/23 1127)      I/O last 24 hours:      Intake/Output Summary (Last 24 hours) at 07/12/2023 1455  Last data filed at 07/12/2023 1200  Gross per 24 hour   Intake 360 ml   Output 800 ml   Net -440 ml     I/O current shift:  12/29 0700 - 12/29 1859  In: 360 [P.O.:360]  Out: 800 [Urine:800]  No results for input(s): "GLUIP" in the last 48 hours.            Labs:  All Lab Results reviewed by myself   for Last 24 Hours:    Results for orders placed or performed during the hospital encounter of 07/11/23 (from the past 24 hour(s))   ECG 12 LEAD - ED USE   Result Value Ref Range    Ventricular rate 82 BPM    Atrial Rate 82 BPM    PR Interval 240 ms    QRS Duration 108 ms    QT Interval 410 ms    QTC Calculation 479 ms    Calculated P Axis 45 degrees    Calculated R Axis -37 degrees    Calculated T Axis 138 degrees   EXTENDED RESPIRATORY VIRUS PANEL    Specimen: Nasopharyngeal Swab   Result Value Ref Range    ADENOVIRUS ARRAY Not Detected Not Detected    CORONAVIRUS 229E Not Detected  Not Detected    CORONAVIRUS HKU1 Not Detected Not Detected    CORONAVIRUS NL63 Not Detected Not Detected    CORONAVIRUS OC43 Not Detected Not Detected    SARS CORONAVIRUS 2 (SARS-CoV-2) Not Detected Not Detected    METAPNEUMOVIRUS ARRAY Not Detected Not Detected    RHINOVIRUS/ENTEROVIRUS ARRAY Not Detected Not Detected    INFLUENZA A Not Detected Not Detected    INFLUENZA B ARRAY Not Detected Not Detected    PARAINFLUENZA 1 ARRAY Not Detected Not Detected    PARAINFLUENZA 2 ARRAY Not Detected Not Detected    PARAINFLUENZA 3 ARRAY Not Detected Not Detected    PARAINFLUENZA 4 ARRAY Not Detected Not Detected    RSV ARRAY Not Detected Not Detected    BORDETELLA PARAPERTUSSIS (IS 1001) Not Detected Not Detected    BORDETELLA PERTUSSIS ARRAY Not Detected Not Detected    CHLAMYDOPHILA PNEUMONIAE ARRAY Not Detected Not Detected    MYCOPLASMA PNEUMONIAE ARRAY Not Detected Not Detected   BASIC METABOLIC PANEL   Result Value Ref Range    SODIUM 137 136 - 145 mmol/L    POTASSIUM 4.3 3.5 - 5.1 mmol/L    CHLORIDE 101 96 - 111 mmol/L    CO2 TOTAL 29 23 - 31 mmol/L    ANION GAP 7 4 - 13 mmol/L    CALCIUM 8.8 8.6 - 10.3 mg/dL    GLUCOSE 80 65 - 875 mg/dL    BUN 14 8 - 25 mg/dL    CREATININE 6.43 3.29 - 1.05 mg/dL    BUN/CREA RATIO 17 6 - 22    ESTIMATED GFR - FEMALE 68 >=60 mL/min/BSA   TROPONIN-I   Result Value Ref Range    TROPONIN-I HS 4.2 <=14.0 ng/L ng/L   PT/INR   Result Value Ref Range    INR 1.07 0.80 - 1.10   CBC WITH DIFF   Result Value Ref Range    WBC 6.4 3.7 - 11.0 x10^3/uL    RBC 3.71 (L) 3.85 - 5.22 x10^6/uL    HGB 12.2 11.5 - 16.0 g/dL    HCT 51.8 84.1 - 66.0 %    MCV 94.6 78.0 - 100.0 fL    MCH 32.9 (H) 26.0 - 32.0 pg    MCHC 34.8 31.0 - 35.5 g/dL    RDW-CV 63.0 16.0 - 10.9 %    PLATELETS 232 150 - 400 x10^3/uL    MPV 9.6 8.7 -  12.5 fL    NEUTROPHIL % 65.0 %    LYMPHOCYTE % 20.8 %    MONOCYTE % 12.1 %    EOSINOPHIL % 0.9 %    BASOPHIL % 0.9 %    NEUTROPHIL # 4.14 1.50 - 7.70 x10^3/uL    LYMPHOCYTE # 1.33 1.00 - 4.80  x10^3/uL    MONOCYTE # 0.77 0.20 - 1.10 x10^3/uL    EOSINOPHIL # <0.10 <=0.50 x10^3/uL    BASOPHIL # <0.10 <=0.20 x10^3/uL    IMMATURE GRANULOCYTE % 0.3 0.0 - 1.0 %    IMMATURE GRANULOCYTE # <0.10 <0.10 x10^3/uL   B-TYPE NATRIURETIC PEPTIDE   Result Value Ref Range    BNP 115 (H) 0 - 100 pg/mL   B-TYPE NATRIURETIC PEPTIDE (BNP),PLASMA   Result Value Ref Range    BNP 112 (H) 0 - 100 pg/mL   BLOOD GAS Venous   Result Value Ref Range    %FIO2 (VENOUS) 21.0 %    PH (VENOUS) 7.39 7.32 - 7.43    PCO2 (VENOUS) 54 (H) 41 - 51 mm/Hg    PO2 (VENOUS) 36 35 - 50 mm/Hg    BICARBONATE (VENOUS) 29.0 22.0 - 29.0 mmol/L    BASE EXCESS 6.2 (H) 0.0 - 3.0 mmol/L    O2 SATURATION (VENOUS) 68.3 40.0 - 85.0 %   TROPONIN-I   Result Value Ref Range    TROPONIN-I HS 3.8 <=14.0 ng/L ng/L   ECG 12 LEAD - ADULT   Result Value Ref Range    Ventricular rate 79 BPM    Atrial Rate 79 BPM    PR Interval 208 ms    QRS Duration 126 ms    QT Interval 468 ms    QTC Calculation 536 ms    Calculated P Axis 24 degrees    Calculated R Axis -34 degrees    Calculated T Axis 157 degrees         Rosie Fate, MD      This note may have been partially generated using MModal Fluency Direct system, and there may be some incorrect words, spellings, and punctuation that were not noted in checking the note before saving.

## 2023-07-12 NOTE — Nurses Notes (Signed)
Patient is alert and oriented times four. IV remains clean, dry, and intact. Vitals are stable. Pain is controlled with PRN pain medicine. Pt is on continuous pulse ox sating in the high 90's and on room air. No reports of pain just discomfort in her abdomen. Free from falls.     Alphonzo Lemmings, GN

## 2023-07-12 NOTE — H&P (Addendum)
[facility]  General  History and Physical    Haley, Cantrell Start Date:  07/11/2023  Inpatient Admission Date:  07/12/2023   Date of Birth:  07-Aug-1934      PCP: Marylee Floras, MD  Chief Complaint:  Shortness of breaths       HPI: [name] is a 88 y.o., White female who presents with 3-4 day history of gradually increasing shortness a breath.  This started with dyspnea on exertion and now is occurring at rest.  Peripheral edema has been worsening over this time frame.  Patient has been unable to lay flat in bed because of such.  Denies chest pain or palpitations.  Previous history of congestive heart failure was noted to present similarly.  She reports compliance with her salt restricted diet.  She has also been compliant with her medical regimen.  No other complaints are reported.  She denies any cough, sputum production or hemoptysis.  Previous left pleural effusion noted to have been drained via thoracentesis a few weeks ago.    Patient Active Problem List    Diagnosis Date Noted    Pleural effusion 07/12/2023    CHF (congestive heart failure) (CMS HCC) 06/29/2023    Constipation 04/01/2023    Fecal impaction of colon (CMS HCC) 02/05/2023    Protein-calorie malnutrition, unspecified severity (CMS HCC) 09/08/2022    Vitamin D deficiency 12/23/2016    High risk medication use 12/23/2016    HLD (hyperlipidemia) 12/03/2016    Hypothyroidism 06/30/2006       Past Medical History:   Diagnosis Date    Fibromyalgia     Hypercholesterolemia     Thyroid disease            Past Surgical History:   Procedure Laterality Date    HX BREAST LUMPECTOMY Right            Medications Prior to Admission       Prescriptions    carvediloL (COREG) 3.125 mg Oral Tablet    Take 1 Tablet (3.125 mg total) by mouth Twice daily with food for 360 days    furosemide (LASIX) 20 mg Oral Tablet    Take 1 Tablet (20 mg total) by mouth Once a day for 360 days    lactulose (ENULOSE) 20 gram/30 mL Oral Solution     Take 30 mL by mouth Three times a day for 846 doses    levothyroxine (SYNTHROID) 75 mcg Oral Tablet    Take 1 Tablet (75 mcg total) by mouth Every morning    lidocaine 4 % Adhesive Patch, Medicated    Place 1 Patch on the skin Once a day    lisinopriL (PRINIVIL) 2.5 mg Oral Tablet    Take 1 Tablet (2.5 mg total) by mouth Once a day for 360 days    metoclopramide HCl (REGLAN) 5 mg Oral Tablet    Take 1 Tablet (5 mg total) by mouth Three times daily before meals    sennosides-docusate sodium (SENOKOT-S) 8.6-50 mg Oral Tablet    Take 1 Tablet by mouth Twice daily for 360 days    simethicone (MYLICON) 80 mg Oral Tablet, Chewable    Chew and swallow 2 Tablets (160 mg total) by mouth Four times a day          acetaminophen (TYLENOL) tablet, 650 mg, Oral, Q6H PRN  aluminum-magnesium hydroxide-simethicone (MAG-AL PLUS) 200-200-20 mg per 5 mL oral liquid, 30 mL, Oral, Q4H PRN  aspirin (ECOTRIN)  enteric coated tablet 81 mg, 81 mg, Oral, Daily  atorvastatin (LIPITOR) tablet, 40 mg, Oral, QPM  carvedilol (COREG) tablet, 3.125 mg, Oral, 2x/day-Food  docusate sodium (COLACE) capsule, 100 mg, Oral, 2x/day PRN  enoxaparin PF (LOVENOX) 30 mg/0.3 mL SubQ injection, 30 mg, Subcutaneous, Q24H  furosemide (LASIX) 10 mg/mL injection, 40 mg, Intravenous, 2x/day  lactulose (ENULOSE) 20g per 30mL oral liquid, 30 mL, Oral, 3x/day  levothyroxine (SYNTHROID) tablet, 75 mcg, Oral, QAM  lisinopril (PRINIVIL) tablet, 5 mg, Oral, Daily  magnesium hydroxide (MILK OF MAGNESIA) 400mg  per 5mL oral liquid, 15 mL, Oral, 4x/day PRN  metoclopramide (REGLAN) tablet, 5 mg, Oral, 3x/day AC  NS flush syringe, 3 mL, Intracatheter, Q8HRS  NS flush syringe, 3 mL, Intracatheter, Q1H PRN  ondansetron (ZOFRAN) 2 mg/mL injection, 4 mg, Intravenous, Q6H PRN  pantoprazole (PROTONIX) delayed release tablet, 40 mg, Oral, Daily before Breakfast  sennosides-docusate sodium (SENOKOT-S) 8.6-50mg  per tablet, 1 Tablet, Oral, 2x/day  simethicone (MYLICON) chewable tablet, 160  mg, Oral, 4x/day AC        Allergies   Allergen Reactions    Lactose Rash    Grass Pollen        Social History     Tobacco Use    Smoking status: Never    Smokeless tobacco: Never   Substance Use Topics    Alcohol use: Never       Family History:    Family Medical History:       Problem Relation (Age of Onset)    Hypertension (High Blood Pressure) Mother, Father              ROS: Other than ROS in the HPI, all other systems were negative.  Cardiovascular-see HPI  Pulmonary-see HPI  Neurologic-patient denies hemiparesis, paresthesias, dysesthesias, dysarthrias, balance problems, syncopal episodes, seizure disorder or headache.  GI-patient denies nausea, vomiting, hematemesis, melena, hematochezia, diarrhea, constipation or other abdominal complaints.  GU-patient denies dysuria, hematuria, urinary frequency, kidney stone, kidney infection, flank pain, fever, chills, diaphoresis or night sweats.  Endocrine-patient denies change in appetite, thirst, weight or temperature intolerance.  Musculoskeletal-the patient denies arthralgias, myalgias, myopathies or weakness.  Hematologic-patient denies any clotting abnormalities, bleeding diatheses or bruising nor any blood dyscrasias.  Integumentary-patient denies rashes, lesions or any other changing skin disorder  ENT-patient denies sinus, throat or ear congestion, drainage or pain   Psychiatric-patient denies anxiety, depression or other behavioral disturbance    DNR Status:  FULL CODE: ATTEMPT RESUSCITATION/CPR    EXAM:  Temperature: 36.9 C (98.4 F)  Heart Rate: 81  BP (Non-Invasive): (!) 140/73  Respiratory Rate: 18  SpO2: 97 %  Constitutional: mild distress  Eyes: Conjunctiva clear.  ENT: ENMT without erythema or injection, mucous membranes moist.  Neck: supple, symmetrical, trachea midline and no adenopathy  Respiratory: Clear to auscultation bilaterally.  Decreased breath sounds in the left base.  Breathing was unlabored at rest  Cardiovascular: regular rate and  rhythm, S1, S2 normal, no  click, rub or gallop; 2/6 systolic murmur heard best at the right upper sternal border radiating over the precordium  No JVD  Homans sign is negative, no sign of DVT  pedal edema 2+ bilateral  Gastrointestinal: Soft, non-tender, Bowel sounds normal, non-distended, No hepatosplenomegaly, No masses, Abdominal bruit absent  Genitourinary: Deferred  Musculoskeletal: Head atraumatic and normocephalic and Other joints are grossly normal  Integumentary:  Skin warm and dry  Neurologic: Grossly normal  Lymphatic/Immunologic/Hematologic: No lymphadenopathy  Psychiatric: Normal        Labs:  Lab Results Today:    Results for orders placed or performed during the hospital encounter of 07/11/23 (from the past 24 hour(s))   ECG 12 LEAD - ED USE   Result Value Ref Range    Ventricular rate 82 BPM    Atrial Rate 82 BPM    PR Interval 240 ms    QRS Duration 108 ms    QT Interval 410 ms    QTC Calculation 479 ms    Calculated P Axis 45 degrees    Calculated R Axis -37 degrees    Calculated T Axis 138 degrees   EXTENDED RESPIRATORY VIRUS PANEL    Specimen: Nasopharyngeal Swab   Result Value Ref Range    ADENOVIRUS ARRAY Not Detected Not Detected    CORONAVIRUS 229E Not Detected Not Detected    CORONAVIRUS HKU1 Not Detected Not Detected    CORONAVIRUS NL63 Not Detected Not Detected    CORONAVIRUS OC43 Not Detected Not Detected    SARS CORONAVIRUS 2 (SARS-CoV-2) Not Detected Not Detected    METAPNEUMOVIRUS ARRAY Not Detected Not Detected    RHINOVIRUS/ENTEROVIRUS ARRAY Not Detected Not Detected    INFLUENZA A Not Detected Not Detected    INFLUENZA B ARRAY Not Detected Not Detected    PARAINFLUENZA 1 ARRAY Not Detected Not Detected    PARAINFLUENZA 2 ARRAY Not Detected Not Detected    PARAINFLUENZA 3 ARRAY Not Detected Not Detected    PARAINFLUENZA 4 ARRAY Not Detected Not Detected    RSV ARRAY Not Detected Not Detected    BORDETELLA PARAPERTUSSIS (IS 1001) Not Detected Not Detected    BORDETELLA PERTUSSIS  ARRAY Not Detected Not Detected    CHLAMYDOPHILA PNEUMONIAE ARRAY Not Detected Not Detected    MYCOPLASMA PNEUMONIAE ARRAY Not Detected Not Detected   BASIC METABOLIC PANEL   Result Value Ref Range    SODIUM 137 136 - 145 mmol/L    POTASSIUM 4.3 3.5 - 5.1 mmol/L    CHLORIDE 101 96 - 111 mmol/L    CO2 TOTAL 29 23 - 31 mmol/L    ANION GAP 7 4 - 13 mmol/L    CALCIUM 8.8 8.6 - 10.3 mg/dL    GLUCOSE 80 65 - 725 mg/dL    BUN 14 8 - 25 mg/dL    CREATININE 3.66 4.40 - 1.05 mg/dL    BUN/CREA RATIO 17 6 - 22    ESTIMATED GFR - FEMALE 68 >=60 mL/min/BSA   TROPONIN-I   Result Value Ref Range    TROPONIN-I HS 4.2 <=14.0 ng/L ng/L   PT/INR   Result Value Ref Range    INR 1.07 0.80 - 1.10   CBC WITH DIFF   Result Value Ref Range    WBC 6.4 3.7 - 11.0 x10^3/uL    RBC 3.71 (L) 3.85 - 5.22 x10^6/uL    HGB 12.2 11.5 - 16.0 g/dL    HCT 34.7 42.5 - 95.6 %    MCV 94.6 78.0 - 100.0 fL    MCH 32.9 (H) 26.0 - 32.0 pg    MCHC 34.8 31.0 - 35.5 g/dL    RDW-CV 38.7 56.4 - 33.2 %    PLATELETS 232 150 - 400 x10^3/uL    MPV 9.6 8.7 - 12.5 fL    NEUTROPHIL % 65.0 %    LYMPHOCYTE % 20.8 %    MONOCYTE % 12.1 %    EOSINOPHIL % 0.9 %    BASOPHIL % 0.9 %    NEUTROPHIL # 4.14 1.50 - 7.70  x10^3/uL    LYMPHOCYTE # 1.33 1.00 - 4.80 x10^3/uL    MONOCYTE # 0.77 0.20 - 1.10 x10^3/uL    EOSINOPHIL # <0.10 <=0.50 x10^3/uL    BASOPHIL # <0.10 <=0.20 x10^3/uL    IMMATURE GRANULOCYTE % 0.3 0.0 - 1.0 %    IMMATURE GRANULOCYTE # <0.10 <0.10 x10^3/uL   B-TYPE NATRIURETIC PEPTIDE   Result Value Ref Range    BNP 115 (H) 0 - 100 pg/mL   BLOOD GAS Venous   Result Value Ref Range    %FIO2 (VENOUS) 21.0 %    PH (VENOUS) 7.39 7.32 - 7.43    PCO2 (VENOUS) 54 (H) 41 - 51 mm/Hg    PO2 (VENOUS) 36 35 - 50 mm/Hg    BICARBONATE (VENOUS) 29.0 22.0 - 29.0 mmol/L    BASE EXCESS 6.2 (H) 0.0 - 3.0 mmol/L    O2 SATURATION (VENOUS) 68.3 40.0 - 85.0 %       Imaging Studies:    Results for orders placed or performed during the hospital encounter of 07/11/23   XR AP MOBILE CHEST     Status:  None    Narrative    Trust L Stahnke    PROCEDURE DESCRIPTION: XR AP MOBILE CHEST    CLINICAL INDICATION: Respiratory problem    TECHNIQUE: 1 views / 1 images submitted.    COMPARISON: Chest x-ray dated June 30, 2023      FINDINGS: The heart is enlarged. Stable scarring at both lung apices noted. Persistent left basilar opacity consistent with pleural fluid. Severe degeneration of the shoulders and spine noted.      Impression    Persistent left basilar pleural effusion.                Radiologist location ID: [id number]           DVT RISK FACTORS HAVE BEN ASSESSED AND PROPHYLAXIS ORDERED (SEE [place] [facility] ONLINE - REFERENCE TOOLS - MD, DVT PROPHY OR POCKET CARD)    Assessment/Plan:  Acute recurrent on chronic, systolic congestive heart failure with left pleural effusion noted previously to be transudate.  Will attempt diuresis.  Consult Cardiology.  Previous echocardiogram report noted.  We will empirically increase the patient's ACE inhibitor and add aspirin with statin.  We will rule out a myocardial infarction per isoenzymes and serial EKGs  Left pleural effusion-see above.  Repeat thoracentesis may be indicated   Severe protein calorie malnutrition-this is likely contributing to the above.  Consult nutrition Services   Chronic constipation-stable, continue as is   Mixed hyperlipidemia-see above   Acquired hypothyroidism-stable, continue as is   Peptic ulcer disease, deep vein thrombosis and atelectasis prophylaxis   Discharge plans pending     DVT/PE Prophylaxis: Enoxaparin  Disposition Planning: Home discharge     More than 75 minutes were spent reviewing the patient's records, acquiring history, performing physical exam, reviewing labs and x-ray reports, reviewing procedures as applicable, ordering medications as applicable, educating the patient in plain [place], coordinating care with staff and other providers as applicable, independent interpretation of lab results as applicable, medical decision making,  post-visit activities and documentation of the above.  Old medical records were reviewed as available and no other relevant information was obtained.    This report was generated using voice recognition software, and is inherently subject to errors including those of syntax and "sound-alike " substitutions which may escape proof reading. In such instances, original meaning may be extrapolated by contextual derivation.      Jonny Ruiz  Jeffie Pollock, MD

## 2023-07-12 NOTE — Care Plan (Signed)
Problem: Health Knowledge, Opportunity to Enhance (Adult,Obstetrics,Pediatric)  Goal: Knowledgeable about Health Subject/Topic  Description: Patient will demonstrate the desired outcomes by discharge/transition of care.  Outcome: Ongoing (see interventions/notes)     Problem: ARDS (Acute Respiratory Distress Syndrome)  Goal: Effective Oxygenation  Outcome: Ongoing (see interventions/notes)  Intervention: Optimize Oxygenation, Ventilation and Perfusion  Recent Flowsheet Documentation  Taken 07/12/2023 0000 by Bethanne Ginger, GN  Head of Bed (HOB) Positioning: HOB elevated     Problem: Pain Acute  Goal: Optimal Pain Control and Function  Outcome: Ongoing (see interventions/notes)     Problem: Fall Injury Risk  Goal: Absence of Fall and Fall-Related Injury  Outcome: Ongoing (see interventions/notes)  Intervention: Promote Injury-Free Environment  Recent Flowsheet Documentation  Taken 07/12/2023 0400 by Bethanne Ginger, GN  Safety Promotion/Fall Prevention: safety round/check completed  Taken 07/12/2023 0000 by Bethanne Ginger, GN  Safety Promotion/Fall Prevention: safety round/check completed

## 2023-07-13 ENCOUNTER — Other Ambulatory Visit: Payer: Self-pay

## 2023-07-13 ENCOUNTER — Inpatient Hospital Stay (HOSPITAL_COMMUNITY): Payer: Medicare Other

## 2023-07-13 DIAGNOSIS — K5909 Other constipation: Secondary | ICD-10-CM

## 2023-07-13 DIAGNOSIS — E039 Hypothyroidism, unspecified: Secondary | ICD-10-CM

## 2023-07-13 DIAGNOSIS — E43 Unspecified severe protein-calorie malnutrition: Secondary | ICD-10-CM

## 2023-07-13 DIAGNOSIS — I5022 Chronic systolic (congestive) heart failure: Secondary | ICD-10-CM

## 2023-07-13 DIAGNOSIS — Z79899 Other long term (current) drug therapy: Secondary | ICD-10-CM

## 2023-07-13 DIAGNOSIS — Z681 Body mass index (BMI) 19 or less, adult: Secondary | ICD-10-CM

## 2023-07-13 LAB — ECG 12 LEAD - ADULT
Atrial Rate: 81 {beats}/min
Calculated P Axis: 56 degrees
Calculated R Axis: -37 degrees
Calculated T Axis: 139 degrees
PR Interval: 204 ms
QRS Duration: 110 ms
QT Interval: 436 ms
QTC Calculation: 506 ms
Ventricular rate: 81 {beats}/min

## 2023-07-13 LAB — COMPREHENSIVE METABOLIC PANEL, NON-FASTING
ALBUMIN: 3.1 g/dL — ABNORMAL LOW (ref 3.4–4.8)
ALKALINE PHOSPHATASE: 50 U/L — ABNORMAL LOW (ref 55–145)
ALT (SGPT): 5 U/L — ABNORMAL LOW (ref 8–22)
ANION GAP: 8 mmol/L (ref 4–13)
AST (SGOT): 17 U/L (ref 8–45)
BILIRUBIN TOTAL: 0.6 mg/dL (ref 0.3–1.3)
BUN/CREA RATIO: 24 — ABNORMAL HIGH (ref 6–22)
BUN: 19 mg/dL (ref 8–25)
CALCIUM: 8.3 mg/dL — ABNORMAL LOW (ref 8.6–10.3)
CHLORIDE: 97 mmol/L (ref 96–111)
CO2 TOTAL: 32 mmol/L — ABNORMAL HIGH (ref 23–31)
CREATININE: 0.78 mg/dL (ref 0.60–1.05)
ESTIMATED GFR - FEMALE: 74 mL/min/BSA (ref >=60)
GLUCOSE: 81 mg/dL (ref 65–125)
POTASSIUM: 3.4 mmol/L — ABNORMAL LOW (ref 3.5–5.1)
PROTEIN TOTAL: 5.3 g/dL — ABNORMAL LOW (ref 6.0–8.0)
SODIUM: 137 mmol/L (ref 136–145)

## 2023-07-13 LAB — CBC
HCT: 33.3 % — ABNORMAL LOW (ref 34.8–46.0)
HGB: 11.4 g/dL — ABNORMAL LOW (ref 11.5–16.0)
MCH: 32.6 pg — ABNORMAL HIGH (ref 26.0–32.0)
MCHC: 34.2 g/dL (ref 31.0–35.5)
MCV: 95.1 fL (ref 78.0–100.0)
MPV: 9.3 fL (ref 8.7–12.5)
PLATELETS: 219 10*3/uL (ref 150–400)
RBC: 3.5 10*6/uL — ABNORMAL LOW (ref 3.85–5.22)
RDW-CV: 13.2 % (ref 11.5–15.5)
WBC: 5.5 10*3/uL (ref 3.7–11.0)

## 2023-07-13 LAB — MAGNESIUM: MAGNESIUM: 2.1 mg/dL (ref 1.8–2.6)

## 2023-07-13 LAB — PHOSPHORUS: PHOSPHORUS: 3.7 mg/dL (ref 2.3–4.0)

## 2023-07-13 NOTE — Care Plan (Signed)
Problem: Health Knowledge, Opportunity to Enhance (Adult,Obstetrics,Pediatric)  Goal: Knowledgeable about Health Subject/Topic  Description: Patient will demonstrate the desired outcomes by discharge/transition of care.  Outcome: Ongoing (see interventions/notes)  Intervention: Enhance Health Knowledge  Recent Flowsheet Documentation  Taken 07/13/2023 0856 by Lenna Sciara, LPN  Family/Support System Care:   support provided   self-care encouraged     Problem: ARDS (Acute Respiratory Distress Syndrome)  Goal: Effective Oxygenation  Outcome: Ongoing (see interventions/notes)     Problem: Pain Acute  Goal: Optimal Pain Control and Function  Outcome: Ongoing (see interventions/notes)  Intervention: Optimize Psychosocial Wellbeing  Recent Flowsheet Documentation  Taken 07/13/2023 0856 by Lenna Sciara, LPN  Diversional Activities: television  Intervention: Prevent or Manage Pain  Recent Flowsheet Documentation  Taken 07/13/2023 0856 by Lenna Sciara, LPN  Sleep/Rest Enhancement:   awakenings minimized   consistent schedule promoted     Problem: Fall Injury Risk  Goal: Absence of Fall and Fall-Related Injury  Outcome: Ongoing (see interventions/notes)  Intervention: Promote Injury-Free Environment  Recent Flowsheet Documentation  Taken 07/13/2023 0856 by Lenna Sciara, LPN  Safety Promotion/Fall Prevention: safety round/check completed     Problem: Adult Inpatient Plan of Care  Goal: Plan of Care Review  Outcome: Ongoing (see interventions/notes)  Goal: Patient-Specific Goal (Individualized)  Outcome: Ongoing (see interventions/notes)  Goal: Absence of Hospital-Acquired Illness or Injury  Outcome: Ongoing (see interventions/notes)  Intervention: Identify and Manage Fall Risk  Recent Flowsheet Documentation  Taken 07/13/2023 0856 by Lenna Sciara, LPN  Safety Promotion/Fall Prevention: safety round/check completed  Intervention: Prevent Skin Injury  Recent Flowsheet Documentation  Taken 07/13/2023 0856 by Lenna Sciara, LPN  Body Position: supine,  head elevated  Intervention: Prevent and Manage VTE (Venous Thromboembolism) Risk  Recent Flowsheet Documentation  Taken 07/13/2023 0856 by Lenna Sciara, LPN  VTE Prevention/Management:   ambulation promoted   dorsiflexion/plantar flexion performed  Goal: Optimal Comfort and Wellbeing  Outcome: Ongoing (see interventions/notes)  Intervention: Provide Person-Centered Care  Recent Flowsheet Documentation  Taken 07/13/2023 0856 by Lenna Sciara, LPN  Trust Relationship/Rapport:   care explained   choices provided   questions answered   questions encouraged   thoughts/feelings acknowledged  Goal: Rounds/Family Conference  Outcome: Ongoing (see interventions/notes)     Problem: Skin Injury Risk Increased  Goal: Skin Health and Integrity  Outcome: Ongoing (see interventions/notes)  Intervention: Optimize Skin Protection  Recent Flowsheet Documentation  Taken 07/13/2023 0856 by Lenna Sciara, LPN  Pressure Reduction Techniques: Frequent weight shifting encouraged  Activity Management: up ad lib     Problem: Heart Failure  Goal: Optimal Coping  Outcome: Ongoing (see interventions/notes)  Intervention: Support Psychosocial Response  Recent Flowsheet Documentation  Taken 07/13/2023 0856 by Lenna Sciara, LPN  Family/Support System Care:   support provided   self-care encouraged  Goal: Optimal Cardiac Output and Blood Flow  Outcome: Ongoing (see interventions/notes)  Goal: Stable Heart Rate and Rhythm  Outcome: Ongoing (see interventions/notes)  Goal: Fluid and Electrolyte Balance  Outcome: Ongoing (see interventions/notes)  Goal: Optimal Functional Ability  Outcome: Ongoing (see interventions/notes)  Intervention: Optimize Functional Ability  Recent Flowsheet Documentation  Taken 07/13/2023 0856 by Lenna Sciara, LPN  Activity Management: up ad lib  Goal: Improved Oral Intake  Outcome: Ongoing (see interventions/notes)  Goal: Effective Oxygenation and Ventilation  Outcome: Ongoing (see interventions/notes)  Intervention: Promote Airway Secretion  Clearance  Recent Flowsheet Documentation  Taken 07/13/2023 0856 by Lenna Sciara, LPN  Activity Management: up ad lib  Goal: Effective Breathing Pattern During Sleep  Outcome:  Ongoing (see interventions/notes)     Problem: Gas Exchange Impaired  Goal: Optimal Gas Exchange  Outcome: Ongoing (see interventions/notes)

## 2023-07-13 NOTE — Care Management Notes (Signed)
[facility]  Care Management Note    Patient Name: DETRA POLIZZI  Date of Birth: 1934-12-09  Sex: female  Date/Time of Admission: 07/11/2023 11:28 PM  Room/Bed: 4115/A  Payor: MEDICARE / Plan: MEDICARE PART A AND B / Product Type: Medicare /    LOS: 1 day   PCP: Marylee Floras, MD    Admitting Diagnosis:  Pleural effusion [J90]    Assessment:      07/13/23 1051   Medicare Intent to Discharge Documentation   Admit IMM given to: Patient   Admit IMM letter given date 07/13/23   Admit IMM letter time given 0955   IMM explained/reviewed with:  Patient;verbalized understanding     Patient class listed as inpatient, CM Assistant visited patient, explained inpatient status/reviewed IMM. Patient verbalized understanding signed/dated/timed IMM. Patient was provided a copy of the IMM letter. CM Assistant placed signed copy on chart.      Jorene Minors Robinson-CM ASSISTANT  Phone: [phone]

## 2023-07-13 NOTE — Nurses Notes (Signed)
Vital signs stable, patient alert and oriented x 3. She went for an echo. Daughter has been at bedside.  Patient is able to pivot to the bedside commode. She has been sinus rhythm on telemetry. Discussed plan of care during shift with patient and answered questions.

## 2023-07-13 NOTE — Progress Notes (Signed)
[facility]  Cardiology   Progress Note      [name], [name]  Date of Admission:  07/11/2023  Date of service:  07/17/2023  MRN:  [id number]    Hospital Day:  LOS: 1 day     Assessment/ Plan:  Acute on chronic systolic congestive heart failure continue Lasix heart catheterization stable continue medical treatment  Small left pleural effusion status post thoracentesis  Severe protein and calorie malnutrition encouraging to take Ensure  Chronic constipation  Hypothyroid continue Synthroid  Hypotension blood pressure improved  Patient is DNR right now  Failure to thrive  Active Hospital Problems    Diagnosis    Primary Problem: Pleural effusion       Subjective  The patient is feeling better new had no chest pain  No nausea no vomiting no fever or chills    Stable cardiac-wise for discharge to follow up with me in 2 weeks  Current Medications:  acetaminophen (TYLENOL) tablet, 650 mg, Oral, Q6H PRN  aluminum-magnesium hydroxide-simethicone (MAG-AL PLUS) 200-200-20 mg per 5 mL oral liquid, 30 mL, Oral, Q4H PRN  aspirin (ECOTRIN) enteric coated tablet 81 mg, 81 mg, Oral, Daily  atorvastatin (LIPITOR) tablet, 40 mg, Oral, QPM  carvedilol (COREG) tablet, 3.125 mg, Oral, 2x/day-Food  docusate sodium (COLACE) capsule, 100 mg, Oral, 2x/day PRN  enoxaparin PF (LOVENOX) 30 mg/0.3 mL SubQ injection, 30 mg, Subcutaneous, Q24H  furosemide (LASIX) 10 mg/mL injection, 40 mg, Intravenous, 2x/day  lactulose (ENULOSE) 20g per 30mL oral liquid, 30 mL, Oral, 3x/day  levothyroxine (SYNTHROID) tablet, 75 mcg, Oral, QAM  lisinopril (PRINIVIL) tablet, 5 mg, Oral, Daily  magnesium hydroxide (MILK OF MAGNESIA) 400mg  per 5mL oral liquid, 15 mL, Oral, 4x/day PRN  metoclopramide (REGLAN) tablet, 5 mg, Oral, 3x/day AC  NS flush syringe, 3 mL, Intracatheter, Q8HRS  NS flush syringe, 3 mL, Intracatheter, Q1H PRN  ondansetron (ZOFRAN) 2 mg/mL injection, 4 mg, Intravenous, Q6H PRN  pantoprazole (PROTONIX) delayed release tablet, 40 mg, Oral,  Daily before Breakfast  sennosides-docusate sodium (SENOKOT-S) 8.6-50mg  per tablet, 1 Tablet, Oral, 2x/day  simethicone (MYLICON) chewable tablet, 160 mg, Oral, 4x/day AC      Allergies   Allergen Reactions    Lactose Rash    Grass Pollen              Review system  Otherwise negative    Vital Signs:  All vital signs reviewed    Physical exam  Patient alert oriented x3 not in acute distress  HEENT unremarkable  Neck no JVD or bruit carotid upstroke within normal limit neck is supple  Lungs clear no rales rhonchi or wheezing  Heart Regular rhythm no gallop, rub or murmur     Abdomen soft nontender no masses or organomegaly, bowel sounds positive, no masses.  Extremities no cyanosis, clubbing or edema  Skin no rash.  Neuro grossly within normal limit no focal finding      Temp (24hrs) Max:36.9 C (98.4 F)    Temperature: 36.6 C (97.9 F) (07/13/23 0856)  BP (Non-Invasive): 122/63 (07/13/23 1421)  MAP (Non-Invasive): 77 mmHG (07/13/23 1421)  Heart Rate: 82 (07/13/23 1421)  Respiratory Rate: 17 (07/13/23 0856)  SpO2: 95 % (07/13/23 0856)      I/O last 24 hours:      Intake/Output Summary (Last 24 hours) at 07/13/2023 1453  Last data filed at 07/12/2023 1800  Gross per 24 hour   Intake 120 ml   Output 1950 ml   Net -1830 ml  I/O current shift:  No intake/output data recorded.  No results for input(s): "GLUIP" in the last 48 hours.            Labs:  All Lab Results reviewed by myself   for Last 24 Hours:    Results for orders placed or performed during the hospital encounter of 07/11/23 (from the past 24 hour(s))   TROPONIN-I   Result Value Ref Range    TROPONIN-I HS 3.3 <=14.0 ng/L ng/L   TROPONIN-I   Result Value Ref Range    TROPONIN-I HS 8.9 <=14.0 ng/L ng/L   CBC   Result Value Ref Range    WBC 5.5 3.7 - 11.0 x10^3/uL    RBC 3.50 (L) 3.85 - 5.22 x10^6/uL    HGB 11.4 (L) 11.5 - 16.0 g/dL    HCT 60.4 (L) 54.0 - 46.0 %    MCV 95.1 78.0 - 100.0 fL    MCH 32.6 (H) 26.0 - 32.0 pg    MCHC 34.2 31.0 - 35.5 g/dL     RDW-CV 98.1 19.1 - 15.5 %    PLATELETS 219 150 - 400 x10^3/uL    MPV 9.3 8.7 - 12.5 fL   COMPREHENSIVE METABOLIC PANEL, NON-FASTING   Result Value Ref Range    SODIUM 137 136 - 145 mmol/L    POTASSIUM 3.4 (L) 3.5 - 5.1 mmol/L    CHLORIDE 97 96 - 111 mmol/L    CO2 TOTAL 32 (H) 23 - 31 mmol/L    ANION GAP 8 4 - 13 mmol/L    BUN 19 8 - 25 mg/dL    CREATININE 4.78 2.95 - 1.05 mg/dL    BUN/CREA RATIO 24 (H) 6 - 22    ALBUMIN 3.1 (L) 3.4 - 4.8 g/dL     CALCIUM 8.3 (L) 8.6 - 10.3 mg/dL    GLUCOSE 81 65 - 621 mg/dL    ALKALINE PHOSPHATASE 50 (L) 55 - 145 U/L    ALT (SGPT) 5 (L) 8 - 22 U/L    AST (SGOT)  17 8 - 45 U/L    BILIRUBIN TOTAL 0.6 0.3 - 1.3 mg/dL    PROTEIN TOTAL 5.3 (L) 6.0 - 8.0 g/dL    ESTIMATED GFR - FEMALE 74 >=60 mL/min/BSA   MAGNESIUM   Result Value Ref Range    MAGNESIUM 2.1 1.8 - 2.6 mg/dL   PHOSPHORUS   Result Value Ref Range    PHOSPHORUS 3.7 2.3 - 4.0 mg/dL   ECG 12 LEAD - ADULT   Result Value Ref Range    Ventricular rate 81 BPM    Atrial Rate 81 BPM    PR Interval 204 ms    QRS Duration 110 ms    QT Interval 436 ms    QTC Calculation 506 ms    Calculated P Axis 56 degrees    Calculated R Axis -37 degrees    Calculated T Axis 139 degrees   TRANSTHORACIC ECHOCARDIOGRAM - ADULT   Result Value Ref Range    PISA AR VN Nyquist 423.00 cm/s    Mitral Regurgitant Velocity Time Integral 171.00 cm    Mr max vel 471.00 cm/s    Tricuspid Valve Regurgitation Velocity Time Interval 72.50 cm    Triscuspid Valve Regurgitation Peak Gradient 20.00 mmHg         Rosie Fate, MD      This note may have been partially generated using MModal Fluency Direct system, and there may be some incorrect words, spellings, and punctuation  that were not noted in checking the note before saving.

## 2023-07-13 NOTE — Care Management Notes (Addendum)
[facility]  Care Management Initial Evaluation    Patient Name: [name] KIENTZ  Date of Birth: [birth year]  Sex: female  Date/Time of Admission: 07/11/2023 11:28 PM  Room/Bed: 4115/A  Payor: MEDICARE / Plan: MEDICARE PART A AND B / Product Type: Medicare /   Primary Care Providers:  Marylee Floras, MD, MD (General)    Pharmacy Info:   Preferred Pharmacy       [facility] [address] [place] - [address]    [address] [place] [address]    Phone: [phone] Fax: [phone]    Hours: Not open 24 hours    [facility]    [address] [place] [address]    Phone: [phone] Fax: [phone]    Hours: Monday-Friday 7AM-7PM, Saturday 10AM-3PM, Closed Sunday          Emergency Contact Info:   Extended Emergency Contact Information  Primary Emergency Contact: vincent,donna   [place]  Home Phone: [phone]  Mobile Phone: [phone]  Relation: None    History:   Shantil L Mclouth is a 88 y.o., female, admitted Pleural effusion.    Height/Weight: 157.5 cm (5\' 2") / (!) 33.1 kg (72 lb 15.6 oz)     LOS: 1 day   Admitting Diagnosis: Pleural effusion [J90]    Assessment:      12 /30/24 1518   Assessment Details   Assessment Type Admission   Date of Care Management Update 07/13/23   Readmission   Is this a readmission? Yes   Is this a scheduled readmission? No   During your last admission, did you name someone who would be helping you at home (Lay Caregiver)? No   No Other (comment)   IF YOU WERE DISCHARGED TO HOME AFTER YOUR PRIOR ADMISSION, DID YOU HAVE ANY DISCHARGE RESOURCES SUCH AS:? Yes   Discharge Resources on Prior Admission Home Health   Have you followed the recommended medication instructions that were given following your prior admission? Yes   Were you able to make your hospital follow-up appointments? No   If answered no, why? Appointment date is in the future   Insurance Information/Type   Insurance type Medicare   Employment/Financial   Patient  has Prescription Coverage?  Yes        Name of Insurance Coverage for Medications Medicare   Financial/Environmental Concerns none   Living Environment   Select an age group to open "lives with" row.  Adult   Lives With child(ren), adult  (Adult son, Loraine Leriche.)   Living Arrangements house   Able to Return to Prior Arrangements yes   Living Arrangement Comments Patient lives in a 2 story house with 1 step at entrance. Patient stays on main level of home which has all necessities available.   Home Safety   Home Accessibility no concerns   Care Management Plan   Discharge Planning Status initial meeting   Discharge plan discussed with: Patient;Other (Comment)  (Daughter, Lupita Leash.)   Discharge Needs Assessment   Equipment Currently Used at Home other (see comments);raised toilet;commode  (Rollator walker)   Equipment Needed After Discharge none   Discharge Facility/Level of Care Needs Home with Home Health (code 6)   Transportation Available car;family or friend will provide   Referral Information   Admission Type inpatient   Address Verified verified-no changes   Arrived From home or self-care   ADVANCE DIRECTIVES   Does the Patient have an Advance Directive? No, Information Offered  and Given   Mutuality/Individual Preferences    Patient-Specific Goals (Include Timeframe) Patient will return home once medically stable.   Patient-Specific Preferences n.a.   Anxieties, Fears or Concerns n.a.   Individualized Care Needs Alert and oriented.     Patient was recently admitted to [facility] from 06/29/23 - 07/02/23 for CHF (congestive heart failure).    CM met with patient and her daughter, Lupita Leash, to complete initial assessment and discuss discharge planning. Patient lives with her adult son, Loraine Leriche, in a 2 story house with 1 step at entrance. Patient stays on main level of house with all necessities available. At baseline patient is independent of her ADLs and transporation is provided by family. PCP is Casimiro Needle T. Angotti, MD  and preferred pharmacy is [facility] in [place], [place]. Patient reports she is able to afford her medications as currently prescribed. DME at home include rollator walker, raised toilet seat and shower chair. Patient is currently established with [facility].    Patient is agreeable to continue home health services at time of discharge. FOC signed for [facility] - resumption. HH order pended. Discharge follow-up information updated with [facility]. FOC uploaded to [employer] and referral sent to [facility].    Patient expressed interest in completing advance directives during hospital stay.    CM sent a secure chat to verify patients capacity with Dr. Harrie Foreman. Per Dr. Jules Schick patient has capacity.    CM provided patient/ her daughter with blank MPOA paperwork to be completed.    Discharge Plan:  Home with Home Health (code 6)  Patient reports that home has all basic necessities and that she feels safe at home. Patient anticipates returning home at time of discharge; pending hospital progress.     CM offered patient time to voice any needs or questions; none noted at this time.    The patient will continue to be evaluated for developing discharge needs.     Case Manager: Willaim Rayas, RN  Phone: [phone]

## 2023-07-13 NOTE — Nurses Notes (Signed)
I have reviewed this patient's orders and plan of care. Currently this patient meets requirements for low to mid level nursing care. Will continue to monitor and reevaluate for any changes.   Burech Mcfarland, RN

## 2023-07-13 NOTE — Ancillary Notes (Addendum)
Medical Nutrition Therapy Assessment        SUBJECTIVE : Pt out of room at time of visit. Currently NPO for procedure. 50-100% intakes recorded. Wt trending stable x > 1 year. Per previous RD note 06/30/23 pt normally eats pureed diet at home, does not eat large portions. Pt was previously agreeable to Ensure.    OBJECTIVE:   Current Facility-Administered Medications   Medication Dose Route Frequency    acetaminophen (TYLENOL) tablet  650 mg Oral Q6H PRN    aluminum-magnesium hydroxide-simethicone (MAG-AL PLUS) 200-200-20 mg per 5 mL oral liquid  30 mL Oral Q4H PRN    aspirin (ECOTRIN) enteric coated tablet 81 mg  81 mg Oral Daily    atorvastatin (LIPITOR) tablet  40 mg Oral QPM    carvedilol (COREG) tablet  3.125 mg Oral 2x/day-Food    docusate sodium (COLACE) capsule  100 mg Oral 2x/day PRN    enoxaparin PF (LOVENOX) 30 mg/0.3 mL SubQ injection  30 mg Subcutaneous Q24H    furosemide (LASIX) 10 mg/mL injection  40 mg Intravenous 2x/day    lactulose (ENULOSE) 20g per 30mL oral liquid  30 mL Oral 3x/day    levothyroxine (SYNTHROID) tablet  75 mcg Oral QAM    lisinopril (PRINIVIL) tablet  5 mg Oral Daily    magnesium hydroxide (MILK OF MAGNESIA) 400mg  per 5mL oral liquid  15 mL Oral 4x/day PRN    metoclopramide (REGLAN) tablet  5 mg Oral 3x/day AC    NS flush syringe  3 mL Intracatheter Q8HRS    NS flush syringe  3 mL Intracatheter Q1H PRN    ondansetron (ZOFRAN) 2 mg/mL injection  4 mg Intravenous Q6H PRN    pantoprazole (PROTONIX) delayed release tablet  40 mg Oral Daily before Breakfast    sennosides-docusate sodium (SENOKOT-S) 8.6-50mg  per tablet  1 Tablet Oral 2x/day    simethicone (MYLICON) chewable tablet  160 mg Oral 4x/day AC     Comprehensive Metabolic Profile    Lab Results   Component Value Date/Time    SODIUM 137 07/13/2023 04:29 AM    POTASSIUM 3.4 (L) 07/13/2023 04:29 AM    CHLORIDE 97 07/13/2023 04:29 AM    CO2 32 (H) 07/13/2023 04:29 AM    ANIONGAP 8 07/13/2023 04:29 AM    BUN 19 07/13/2023 04:29 AM     CREATININE 0.78 07/13/2023 04:29 AM    GLUCOSE 81 07/13/2023 04:29 AM    Lab Results   Component Value Date/Time    CALCIUM 8.3 (L) 07/13/2023 04:29 AM    PHOSPHORUS 3.7 07/13/2023 04:29 AM    ALBUMIN 3.1 (L) 07/13/2023 04:29 AM    TOTALPROTEIN 5.3 (L) 07/13/2023 04:29 AM    ALKPHOS 50 (L) 07/13/2023 04:29 AM    AST 17 07/13/2023 04:29 AM    ALT 5 (L) 07/13/2023 04:29 AM    TOTBILIRUBIN 0.6 07/13/2023 04:29 AM          Current Diet Order/Nutrition Support:  MNT PROTOCOL FOR DIETITIAN  DIET NPO NOW    Height Used for Calculations: 157.5 cm (5' 2.01")  Weight Used For Calculations: 33.1 kg (72 lb 15.6 oz)  BMI (kg/m2): 13.37     Ideal Body Weight (IBW) (kg): 50.45  % Ideal Body Weight: 65.61           Wt Readings from Last 15 Encounters:   07/12/23 (!) 33.1 kg (72 lb 15.6 oz)   06/29/23 (!) 35.5 kg (78 lb 4.2 oz)   05/22/23 (!) 33.2 kg (  73 lb 3.1 oz)   04/21/23 (!) 31.3 kg (69 lb)   04/13/23 (!) 31 kg (68 lb 5.5 oz)   04/09/23 (!) 31.8 kg (70 lb)   04/01/23 (!) 31.3 kg (69 lb 0.1 oz)   03/10/23 (!) 31.8 kg (70 lb)   02/20/23 (!) 31.3 kg (69 lb 0.1 oz)   02/12/23 (!) 31.2 kg (68 lb 12.8 oz)   02/04/23 (!) 30.7 kg (67 lb 10.9 oz)   01/29/23 (!) 32 kg (70 lb 8.8 oz)   09/08/22 (!) 33.6 kg (74 lb)   03/05/22 (!) 31.8 kg (70 lb)   09/04/21 (!) 34.5 kg (76 lb)     Physical Assessment: Bilateral ankle trace +1 edema. No reported PI.    NFPE performed 06/30/23:   Subcutaneous fat loss (3+): orbital 3+, cheek 3+, tricep 3+  Muscle mass loss (3+): temple 3+, clavicle 3+, shoulder 3+    Estimated Needs:     1160-1325 kcal per day (35-40 kcals/33.1 kg)   40-50 g protein per day (1.2-1.5 g/33.1 kg)   Fluids per physician    Plan/Interventions :   Advance diet as medically appropriate  Upon diet advancement offer ONS Ensure plus HP BID  Monitor wt, PO intakes, labs      Nutrition Diagnosis:  Severe protein calorie malnutrition  related to Current medical condition as evidenced by  energy intake < 75% x > 1 month, severe muscle  wasting, and severe fat depletion.    Suezanne Jacquet, RD       Pager # [phone]

## 2023-07-13 NOTE — Consults (Signed)
[facility]  Cardiology Consult  Initial Consultation      Haley, Cantrell, 88 y.o. female  Date of service: 07/12/2023  Date of Birth:  1935/07/06    Assessment/Plan:  Acute congestive heart failure on chronic systolic in nature the patient be intravenous Lasix  Pleural effusion secondary to above  Deep vein thrombosis prophylaxis  Severe protein calorie malnutrition  Chronic constipation  Hyperlipidemia continue statin  Hypothyroid on Synthroid  Peptic ulcer disease  Generalized weakness      At this time the patient intravenous Lasix monitor the closely white count 6.4 hemoglobin 12.2 hematocrit 35 platelet 233 chest x-ray with cardiomegaly bilateral scarring and persistent left opacity and pleural effusion troponin was -4.2 INR 1 EKG no acute ischemic abnormalities        Subjective     HPI: [name] is a 88 y.o., White female who presents with increased shortness of breath increased weakness orthopnea and the PND the patient denied any syncope the patient having dyspnea with any exertion she had increases the end edema despite taking the Lasix  Had no chest pain no syncope no seizure and the patient had no changes on the EKG had is history of thoracentesis  Had the previous the echocardiogram on 06/30/23 ejection fraction of 35%    ROS:   No hematemesis hematochezia minute no seizure the patient had no chest pain  No abdominal pain  Patient had the been feeling very weak   All other systems reviewed and are negative.  Past Medical History:   Diagnosis Date    Fibromyalgia     Hypercholesterolemia     Thyroid disease          Past Surgical History:   Procedure Laterality Date    HX BREAST LUMPECTOMY Right          Prior to Admission Medications:  Medications Prior to Admission       Prescriptions    carvediloL (COREG) 3.125 mg Oral Tablet    Take 1 Tablet (3.125 mg total) by mouth Twice daily with food for 360 days    furosemide (LASIX) 20 mg Oral Tablet    Take 1 Tablet (20 mg total) by  mouth Once a day for 360 days    lactulose (ENULOSE) 20 gram/30 mL Oral Solution    Take 30 mL by mouth Three times a day for 846 doses    levothyroxine (SYNTHROID) 75 mcg Oral Tablet    Take 1 Tablet (75 mcg total) by mouth Every morning    lidocaine 4 % Adhesive Patch, Medicated    Place 1 Patch on the skin Once a day    lisinopriL (PRINIVIL) 2.5 mg Oral Tablet    Take 1 Tablet (2.5 mg total) by mouth Once a day for 360 days    metoclopramide HCl (REGLAN) 5 mg Oral Tablet    Take 1 Tablet (5 mg total) by mouth Three times daily before meals    sennosides-docusate sodium (SENOKOT-S) 8.6-50 mg Oral Tablet    Take 1 Tablet by mouth Twice daily for 360 days    simethicone (MYLICON) 80 mg Oral Tablet, Chewable    Chew and swallow 2 Tablets (160 mg total) by mouth Four times a day          Inpatient Medications:  acetaminophen (TYLENOL) tablet, 650 mg, Oral, Q6H PRN  aluminum-magnesium hydroxide-simethicone (MAG-AL PLUS) 200-200-20 mg per 5 mL oral liquid, 30 mL, Oral, Q4H PRN  aspirin (ECOTRIN) enteric coated  tablet 81 mg, 81 mg, Oral, Daily  atorvastatin (LIPITOR) tablet, 40 mg, Oral, QPM  carvedilol (COREG) tablet, 3.125 mg, Oral, 2x/day-Food  docusate sodium (COLACE) capsule, 100 mg, Oral, 2x/day PRN  enoxaparin PF (LOVENOX) 30 mg/0.3 mL SubQ injection, 30 mg, Subcutaneous, Q24H  furosemide (LASIX) 10 mg/mL injection, 40 mg, Intravenous, 2x/day  lactulose (ENULOSE) 20g per 30mL oral liquid, 30 mL, Oral, 3x/day  levothyroxine (SYNTHROID) tablet, 75 mcg, Oral, QAM  lisinopril (PRINIVIL) tablet, 5 mg, Oral, Daily  magnesium hydroxide (MILK OF MAGNESIA) 400mg  per 5mL oral liquid, 15 mL, Oral, 4x/day PRN  metoclopramide (REGLAN) tablet, 5 mg, Oral, 3x/day AC  NS flush syringe, 3 mL, Intracatheter, Q8HRS  NS flush syringe, 3 mL, Intracatheter, Q1H PRN  ondansetron (ZOFRAN) 2 mg/mL injection, 4 mg, Intravenous, Q6H PRN  pantoprazole (PROTONIX) delayed release tablet, 40 mg, Oral, Daily before Breakfast  sennosides-docusate  sodium (SENOKOT-S) 8.6-50mg  per tablet, 1 Tablet, Oral, 2x/day  simethicone (MYLICON) chewable tablet, 160 mg, Oral, 4x/day AC      Allergies   Allergen Reactions    Lactose Rash    Grass Pollen          Family History  Family Medical History:       Problem Relation (Age of Onset)    Hypertension (High Blood Pressure) Mother, Father              Social History  Social History     Socioeconomic History    Marital status: Widowed     Spouse name: Not on file    Number of children: Not on file    Years of education: Not on file    Highest education level: Not on file   Occupational History    Not on file   Tobacco Use    Smoking status: Never    Smokeless tobacco: Never   Vaping Use    Vaping status: Never Used   Substance and Sexual Activity    Alcohol use: Never    Drug use: Never    Sexual activity: Not Currently   Other Topics Concern    Not on file   Social History Narrative    Not on file     Social Determinants of Health     Financial Resource Strain: Not on file   Transportation Needs: Low Risk  (04/13/2023)    Transportation Needs     SDOH Transportation: No   Social Connections: Low Risk  (07/12/2023)    Social Connections     SDOH Social Isolation: 5 or more times a week   Intimate Partner Violence: Not on file   Housing Stability: Low Risk  (02/04/2023)    Housing Stability     SDOH Housing Situation: I have housing.     SDOH Housing Worry: No       Objective     Exam:  Filed Vitals:    07/12/23 1127 07/12/23 1637 07/12/23 2019 07/12/23 2306   BP: (!) 90/51 (!) 99/57 (!) 97/58 (!) 111/57   Pulse: 89 84 86 71   Resp: 18 18 18 18    Temp: 36.5 C (97.7 F) 36.9 C (98.4 F) 36.5 C (97.7 F) 36.8 C (98.2 F)   SpO2: 94% 96% 94% 95%     GENERAL:  Patient is alert and oriented x3, not in acute distress.  Mildly dyspneic  HEENT:  Head is normocephalic atraumatic.  Eyes: Anicteric.  PERRLA, EOMI.   NECK:  Neck vein elevation with JVD no bruit.  Carotid upstroke within normal limits.  No lymphadenopathy or  thyromegaly.   LUNGS:  Bilateral basal rales no wheezing.   HEART:  Regular rate and rhythm.  No gallop, rub or murmur.  No lift, no heaves. PMI was not appreciated.  Pulses are intact.   ABDOMEN:  Soft, nontender.  No masses or organomegaly.  Bowel sounds positive.  No rebound, no guarding.   EXTREMETIES:  No cyanosis, clubbing   2+ edema.   NEUROLOGIC:  Grossly within normal limits.  Cranial nerves 2-12 are within normal limits.  No focal finding.  Speech is normal.   Femoral and distal pulses are intact.   LYMPH NODE:  No adenopathy.   PSYCHIATRIC:  Normal affect.  Alert and oriented x3.   SKIN:  No rash.   BACK:  No CVA tenderness.   Labs:   Results for orders placed or performed during the hospital encounter of 07/11/23 (from the past 24 hour(s))   TROPONIN-I   Result Value Ref Range    TROPONIN-I HS 3.8 <=14.0 ng/L ng/L   ECG 12 LEAD - ADULT   Result Value Ref Range    Ventricular rate 79 BPM    Atrial Rate 79 BPM    PR Interval 208 ms    QRS Duration 126 ms    QT Interval 468 ms    QTC Calculation 536 ms    Calculated P Axis 24 degrees    Calculated R Axis -34 degrees    Calculated T Axis 157 degrees   TROPONIN-I   Result Value Ref Range    TROPONIN-I HS 3.3 <=14.0 ng/L ng/L   TROPONIN-I   Result Value Ref Range    TROPONIN-I HS 8.9 <=14.0 ng/L ng/L          Imaging Studies:    XR AP MOBILE CHEST   Final Result   Persistent left basilar pleural effusion.                        Radiologist location ID: [id number]                 Rosie Fate, MD        This note may have been partially generated using MModal Fluency Direct system, and there may be some incorrect words, spellings, and punctuation that were not noted in checking the note before saving.

## 2023-07-13 NOTE — Care Plan (Signed)
Patient alert and oriented for the shift. Patient had their call bell near them and remained free from falls. Vitals remained stable for the shift. Patient had no complaints of pain. Patient was made NPO for an upcoming procedure. EKG was done in the morning as well. I have reviewed this patient's orders and plan of care. Currently this patient meets requirements for a low to mid level of nursing care.  [name], GN

## 2023-07-13 NOTE — Progress Notes (Signed)
Internal Medicine Progress Note    Names:  [name]  Date of service: 07/13/2023  Date of Admission:  07/11/2023  Hospital Day:  LOS: 1 day     Assessment/ Plan:   Active Hospital Problems    Diagnosis    Primary Problem: Pleural effusion       1. Acute recurrent on chronic systolic congestive heart failure.  Borderline blood pressure makes difficult utilizing ACE inhibitor and Aldactone on a regular basis.    2. Residual small Left pleural effusion with recent thoracentesis.  300 cc were removed several weeks ago.  Remaining effusion seems to be very small.  Discussed risk benefit ratio with patient and family.  Will monitor.    3. Severe protein calorie malnutrition.  Continue to provide supplements     4. Chronic constipation.  No bowel movements since admission.  Patient requested enema.  This is worked in the past.    5. Acquired hypothyroidism.  Stable on present doses.        PT/OT: Yes    Disposition Planning:   Home with home health    Subjective:  No chest pain or palpitations.  Improved shortness a breath at rest.  Denies dizziness nausea vomiting.  Passing flatus.  No bowel movement.  No abdominal pain.  Voiding.    acetaminophen (TYLENOL) tablet, 650 mg, Oral, Q6H PRN  aluminum-magnesium hydroxide-simethicone (MAG-AL PLUS) 200-200-20 mg per 5 mL oral liquid, 30 mL, Oral, Q4H PRN  aspirin (ECOTRIN) enteric coated tablet 81 mg, 81 mg, Oral, Daily  atorvastatin (LIPITOR) tablet, 40 mg, Oral, QPM  carvedilol (COREG) tablet, 3.125 mg, Oral, 2x/day-Food  docusate sodium (COLACE) capsule, 100 mg, Oral, 2x/day PRN  enoxaparin PF (LOVENOX) 30 mg/0.3 mL SubQ injection, 30 mg, Subcutaneous, Q24H  furosemide (LASIX) 10 mg/mL injection, 40 mg, Intravenous, 2x/day  lactulose (ENULOSE) 20g per 30mL oral liquid, 30 mL, Oral, 3x/day  levothyroxine (SYNTHROID) tablet, 75 mcg, Oral, QAM  lisinopril (PRINIVIL) tablet, 5 mg, Oral, Daily  magnesium hydroxide (MILK OF MAGNESIA) 400mg  per 5mL oral liquid, 15 mL,  Oral, 4x/day PRN  metoclopramide (REGLAN) tablet, 5 mg, Oral, 3x/day AC  NS flush syringe, 3 mL, Intracatheter, Q8HRS  NS flush syringe, 3 mL, Intracatheter, Q1H PRN  ondansetron (ZOFRAN) 2 mg/mL injection, 4 mg, Intravenous, Q6H PRN  pantoprazole (PROTONIX) delayed release tablet, 40 mg, Oral, Daily before Breakfast  sennosides-docusate sodium (SENOKOT-S) 8.6-50mg  per tablet, 1 Tablet, Oral, 2x/day  simethicone (MYLICON) chewable tablet, 160 mg, Oral, 4x/day AC        Physical Exam:    Vital Signs:  BP (!) 94/53   Pulse 94   Temp 36.6 C (97.9 F)   Resp 17   Ht 1.575 m (5\' 2" )   Wt (!) 33.1 kg (72 lb 15.6 oz)   LMP  (LMP Unknown)   SpO2 95%   BMI 13.35 kg/m         I/O:  I/O last 24 hours:    Intake/Output Summary (Last 24 hours) at 07/13/2023 1212  Last data filed at 07/12/2023 1800  Gross per 24 hour   Intake 120 ml   Output 1950 ml   Net -1830 ml     I/O current shift:  No intake/output data recorded.  Blood Sugars:     General: No acute distress, alert and oriented x3  Cardio: Regular rate and rhythm. Normal S1 and S2. No murmurs, gallops, or rubs.   Resp:  Crackles in the bases bilaterally.  Perhaps decreased  breath sounds bilaterally.  Abd: Bowel sounds present, Soft, non-tender  Extremities:  No calf pain but trace pitting bilateral lower leg edema      Labs:     Results for orders placed or performed during the hospital encounter of 07/11/23 (from the past 24 hour(s))   TROPONIN-I   Result Value Ref Range    TROPONIN-I HS 3.3 <=14.0 ng/L ng/L   TROPONIN-I   Result Value Ref Range    TROPONIN-I HS 8.9 <=14.0 ng/L ng/L   CBC   Result Value Ref Range    WBC 5.5 3.7 - 11.0 x10^3/uL    RBC 3.50 (L) 3.85 - 5.22 x10^6/uL    HGB 11.4 (L) 11.5 - 16.0 g/dL    HCT 25.4 (L) 27.0 - 46.0 %    MCV 95.1 78.0 - 100.0 fL    MCH 32.6 (H) 26.0 - 32.0 pg    MCHC 34.2 31.0 - 35.5 g/dL    RDW-CV 62.3 76.2 - 83.1 %    PLATELETS 219 150 - 400 x10^3/uL    MPV 9.3 8.7 - 12.5 fL   COMPREHENSIVE METABOLIC PANEL, NON-FASTING    Result Value Ref Range    SODIUM 137 136 - 145 mmol/L    POTASSIUM 3.4 (L) 3.5 - 5.1 mmol/L    CHLORIDE 97 96 - 111 mmol/L    CO2 TOTAL 32 (H) 23 - 31 mmol/L    ANION GAP 8 4 - 13 mmol/L    BUN 19 8 - 25 mg/dL    CREATININE 5.17 6.16 - 1.05 mg/dL    BUN/CREA RATIO 24 (H) 6 - 22    ALBUMIN 3.1 (L) 3.4 - 4.8 g/dL     CALCIUM 8.3 (L) 8.6 - 10.3 mg/dL    GLUCOSE 81 65 - 073 mg/dL    ALKALINE PHOSPHATASE 50 (L) 55 - 145 U/L    ALT (SGPT) 5 (L) 8 - 22 U/L    AST (SGOT)  17 8 - 45 U/L    BILIRUBIN TOTAL 0.6 0.3 - 1.3 mg/dL    PROTEIN TOTAL 5.3 (L) 6.0 - 8.0 g/dL    ESTIMATED GFR - FEMALE 74 >=60 mL/min/BSA   MAGNESIUM   Result Value Ref Range    MAGNESIUM 2.1 1.8 - 2.6 mg/dL   PHOSPHORUS   Result Value Ref Range    PHOSPHORUS 3.7 2.3 - 4.0 mg/dL       Micro:   Hospital Encounter on 07/11/23 (from the past 96 hour(s))   EXTENDED RESPIRATORY VIRUS PANEL    Collection Time: 07/11/23  9:26 PM    Specimen: Nasopharyngeal Swab   Culture Result Status    ADENOVIRUS ARRAY Not Detected Final    CORONAVIRUS 229E Not Detected Final    CORONAVIRUS HKU1 Not Detected Final    CORONAVIRUS NL63 Not Detected Final    CORONAVIRUS OC43 Not Detected Final    SARS CORONAVIRUS 2 (SARS-CoV-2) Not Detected Final    METAPNEUMOVIRUS ARRAY Not Detected Final    RHINOVIRUS/ENTEROVIRUS ARRAY Not Detected Final    INFLUENZA A Not Detected Final    INFLUENZA B ARRAY Not Detected Final    PARAINFLUENZA 1 ARRAY Not Detected Final    PARAINFLUENZA 2 ARRAY Not Detected Final    PARAINFLUENZA 3 ARRAY Not Detected Final    PARAINFLUENZA 4 ARRAY Not Detected Final    RSV ARRAY Not Detected Final    BORDETELLA PARAPERTUSSIS (IS 1001) Not Detected Final    BORDETELLA PERTUSSIS ARRAY Not Detected Final  CHLAMYDOPHILA PNEUMONIAE ARRAY Not Detected Final    MYCOPLASMA PNEUMONIAE ARRAY Not Detected Final       Radiology:    Results for orders placed or performed during the hospital encounter of 07/11/23 (from the past 24 hour(s))   XR CHEST AP     Status: None     Narrative    Elzada L Caporaso    XR CHEST AP performed on 07/13/2023 7:24 AM    INDICATION: 88 years old Female; CHF    COMPARISON: 07/03/2023.      Impression    FINDINGS/IMPRESSION:  Heart appears grossly stable in size. No overt pulmonary edema. Small to moderate left pleural effusion, increased compared to previous exam. Mild associated atelectasis or consolidation. Redemonstration of chronic lung changes. No visible pneumothorax.          Radiologist location ID: [id number]         Marylee Floras, MD    07/13/2023   12:12  [facility]  [facility]

## 2023-07-14 LAB — CBC
HCT: 37.8 % (ref 34.8–46.0)
HGB: 12.7 g/dL (ref 11.5–16.0)
MCH: 32.6 pg — ABNORMAL HIGH (ref 26.0–32.0)
MCHC: 33.6 g/dL (ref 31.0–35.5)
MCV: 97.2 fL (ref 78.0–100.0)
MPV: 9.3 fL (ref 8.7–12.5)
PLATELETS: 251 10*3/uL (ref 150–400)
RBC: 3.89 10*6/uL (ref 3.85–5.22)
RDW-CV: 13.1 % (ref 11.5–15.5)
WBC: 6.1 10*3/uL (ref 3.7–11.0)

## 2023-07-14 LAB — BASIC METABOLIC PANEL
ANION GAP: 9 mmol/L (ref 4–13)
BUN/CREA RATIO: 21 (ref 6–22)
BUN: 18 mg/dL (ref 8–25)
CALCIUM: 8.7 mg/dL (ref 8.6–10.3)
CHLORIDE: 94 mmol/L — ABNORMAL LOW (ref 96–111)
CO2 TOTAL: 33 mmol/L — ABNORMAL HIGH (ref 23–31)
CREATININE: 0.85 mg/dL (ref 0.60–1.05)
ESTIMATED GFR - FEMALE: 67 mL/min/BSA (ref >=60)
GLUCOSE: 86 mg/dL (ref 65–125)
POTASSIUM: 3.7 mmol/L (ref 3.5–5.1)
SODIUM: 136 mmol/L (ref 136–145)

## 2023-07-14 LAB — MAGNESIUM: MAGNESIUM: 2.4 mg/dL (ref 1.8–2.6)

## 2023-07-14 LAB — PHOSPHORUS: PHOSPHORUS: 3.2 mg/dL (ref 2.3–4.0)

## 2023-07-14 NOTE — Progress Notes (Signed)
[facility]  Cardiology   Progress Note      [name], [name]  Date of Admission:  07/11/2023  Date of service: 07/14/2023  Date of Birth:  04-12-1935  MRN:  [id number]    Hospital Day:  LOS: 2 days     Assessment/ Plan:  Acute on chronic systolic congestive heart failure continue Lasix symptoms improved continue Lasix  Small left pleural effusion status post thoracentesis  Severe protein and calorie malnutrition encouraging to take Ensure still  Chronic constipation  Hypothyroid on since  Hypotension  Patient is DNR right now  Failure to thrive continue encouraged to eat  Active Hospital Problems    Diagnosis    Primary Problem: Pleural effusion     Subjective  The patient had no chest pain the patient otherwise very weak and denied any nausea or vomiting no fever or chills        Current Medications:  acetaminophen (TYLENOL) tablet, 650 mg, Oral, Q6H PRN  aluminum-magnesium hydroxide-simethicone (MAG-AL PLUS) 200-200-20 mg per 5 mL oral liquid, 30 mL, Oral, Q4H PRN  aspirin (ECOTRIN) enteric coated tablet 81 mg, 81 mg, Oral, Daily  atorvastatin (LIPITOR) tablet, 40 mg, Oral, QPM  carvedilol (COREG) tablet, 3.125 mg, Oral, 2x/day-Food  docusate sodium (COLACE) capsule, 100 mg, Oral, 2x/day PRN  enoxaparin PF (LOVENOX) 30 mg/0.3 mL SubQ injection, 30 mg, Subcutaneous, Q24H  furosemide (LASIX) 10 mg/mL injection, 40 mg, Intravenous, 2x/day  lactulose (ENULOSE) 20g per 30mL oral liquid, 30 mL, Oral, 3x/day  levothyroxine (SYNTHROID) tablet, 75 mcg, Oral, QAM  lisinopril (PRINIVIL) tablet, 5 mg, Oral, Daily  magnesium hydroxide (MILK OF MAGNESIA) 400mg  per 5mL oral liquid, 15 mL, Oral, 4x/day PRN  metoclopramide (REGLAN) tablet, 5 mg, Oral, 3x/day AC  NS flush syringe, 3 mL, Intracatheter, Q8HRS  NS flush syringe, 3 mL, Intracatheter, Q1H PRN  ondansetron (ZOFRAN) 2 mg/mL injection, 4 mg, Intravenous, Q6H PRN  pantoprazole (PROTONIX) delayed release tablet, 40 mg, Oral, Daily before  Breakfast  sennosides-docusate sodium (SENOKOT-S) 8.6-50mg  per tablet, 1 Tablet, Oral, 2x/day  simethicone (MYLICON) chewable tablet, 160 mg, Oral, 4x/day AC        Allergies   Allergen Reactions    Lactose Rash    Grass Pollen              Review system  Otherwise negative    Vital Signs:  All vital signs reviewed    Physical exam  Patient alert oriented x3 not in acute distress  HEENT unremarkable  Neck no JVD or bruit carotid upstroke within normal limit neck is supple  Lungs clear no rales rhonchi or wheezing  Heart Regular rhythm no gallop, rub or murmur     Abdomen soft nontender no masses or organomegaly, bowel sounds positive, no masses.  Extremities no cyanosis, clubbing or edema  Skin no rash.  Neuro grossly within normal limit no focal finding      Temp (24hrs) Max:36.8 C (98.2 F)      Temperature: 36.3 C (97.3 F) (07/14/23 1642)  BP (Non-Invasive): 114/60 (07/14/23 1642)  MAP (Non-Invasive): 71 mmHG (07/14/23 1642)  Heart Rate: 71 (07/14/23 1642)  Respiratory Rate: 18 (07/14/23 1642)  SpO2: 94 % (07/14/23 1642)      I/O last 24 hours:    Intake/Output Summary (Last 24 hours) at 07/14/2023 1754  Last data filed at 07/14/2023 1200  Gross per 24 hour   Intake 720 ml   Output 200 ml   Net 520 ml  I/O current shift:  12/31 0700 - 12/31 1859  In: 480 [P.O.:480]  Out: 200 [Urine:200]  No results for input(s): "GLUIP" in the last 48 hours.            Labs:  All Lab Results reviewed by myself   for Last 24 Hours:    Results for orders placed or performed during the hospital encounter of 07/11/23 (from the past 24 hour(s))   BASIC METABOLIC PANEL   Result Value Ref Range    SODIUM 136 136 - 145 mmol/L    POTASSIUM 3.7 3.5 - 5.1 mmol/L    CHLORIDE 94 (L) 96 - 111 mmol/L    CO2 TOTAL 33 (H) 23 - 31 mmol/L    ANION GAP 9 4 - 13 mmol/L    CALCIUM 8.7 8.6 - 10.3 mg/dL    GLUCOSE 86 65 - 161 mg/dL    BUN 18 8 - 25 mg/dL    CREATININE 0.96 0.45 - 1.05 mg/dL    BUN/CREA RATIO 21 6 - 22    ESTIMATED GFR - FEMALE 67  >=60 mL/min/BSA   CBC   Result Value Ref Range    WBC 6.1 3.7 - 11.0 x10^3/uL    RBC 3.89 3.85 - 5.22 x10^6/uL    HGB 12.7 11.5 - 16.0 g/dL    HCT 40.9 81.1 - 91.4 %    MCV 97.2 78.0 - 100.0 fL    MCH 32.6 (H) 26.0 - 32.0 pg    MCHC 33.6 31.0 - 35.5 g/dL    RDW-CV 78.2 95.6 - 21.3 %    PLATELETS 251 150 - 400 x10^3/uL    MPV 9.3 8.7 - 12.5 fL   MAGNESIUM   Result Value Ref Range    MAGNESIUM 2.4 1.8 - 2.6 mg/dL   PHOSPHORUS   Result Value Ref Range    PHOSPHORUS 3.2 2.3 - 4.0 mg/dL   ECG 12 LEAD - ADULT   Result Value Ref Range    Ventricular rate 68 BPM    Atrial Rate 68 BPM    PR Interval 240 ms    QRS Duration 114 ms    QT Interval 486 ms    QTC Calculation 516 ms    Calculated P Axis 65 degrees    Calculated R Axis -29 degrees    Calculated T Axis 125 degrees         Rosie Fate, MD      This note may have been partially generated using MModal Fluency Direct system, and there may be some incorrect words, spellings, and punctuation that were not noted in checking the note before saving.

## 2023-07-14 NOTE — Care Plan (Signed)
Patient alert and oriented for the shift. Patient had their call bell near them and remained free from falls. Vitals remained stable for the shift. Patient had no complaints of pain. EKG was done in the morning as well. I have reviewed this patient's orders and plan of care. Currently this patient meets requirements for a low to mid level of nursing care.  [name], GN

## 2023-07-14 NOTE — Progress Notes (Signed)
Internal Medicine Progress Note    Names:  [name]  Date of service: 07/14/2023  Date of Admission:  07/11/2023  Hospital Day:  LOS: 2 days     Assessment/ Plan:   Active Hospital Problems    Diagnosis    Primary Problem: Pleural effusion       1. Acute recurrent on chronic systolic congestive heart failure.  Borderline blood pressure makes difficult utilizing ACE inhibitor and Aldactone on a regular basis.  Continue to dose appropriately    2. Residual small Left pleural effusion with recent thoracentesis.  300 cc were removed several weeks ago.  Remaining effusion seems to be very small.  Discussed risk benefit ratio with patient and family.  Will monitor.    3. Severe protein calorie malnutrition.  Continue to provide supplements     4. Chronic constipation.  Multiple bowel movements after enema.  Continue to monitor  5. Acquired hypothyroidism.  Stable on present doses.        PT/OT: Yes    Disposition Planning:   Home with home health    Subjective:  No chest pain or palpitations.  Improved shortness a breath at rest.  Denies dizziness nausea vomiting.  Passing flatus.  No abdominal pain.  Multiple bowel movements after enema.  Voiding.        acetaminophen (TYLENOL) tablet, 650 mg, Oral, Q6H PRN  aluminum-magnesium hydroxide-simethicone (MAG-AL PLUS) 200-200-20 mg per 5 mL oral liquid, 30 mL, Oral, Q4H PRN  aspirin (ECOTRIN) enteric coated tablet 81 mg, 81 mg, Oral, Daily  atorvastatin (LIPITOR) tablet, 40 mg, Oral, QPM  carvedilol (COREG) tablet, 3.125 mg, Oral, 2x/day-Food  docusate sodium (COLACE) capsule, 100 mg, Oral, 2x/day PRN  enoxaparin PF (LOVENOX) 30 mg/0.3 mL SubQ injection, 30 mg, Subcutaneous, Q24H  furosemide (LASIX) 10 mg/mL injection, 40 mg, Intravenous, 2x/day  lactulose (ENULOSE) 20g per 30mL oral liquid, 30 mL, Oral, 3x/day  levothyroxine (SYNTHROID) tablet, 75 mcg, Oral, QAM  lisinopril (PRINIVIL) tablet, 5 mg, Oral, Daily  magnesium hydroxide (MILK OF MAGNESIA) 400mg  per 5mL  oral liquid, 15 mL, Oral, 4x/day PRN  metoclopramide (REGLAN) tablet, 5 mg, Oral, 3x/day AC  NS flush syringe, 3 mL, Intracatheter, Q8HRS  NS flush syringe, 3 mL, Intracatheter, Q1H PRN  ondansetron (ZOFRAN) 2 mg/mL injection, 4 mg, Intravenous, Q6H PRN  pantoprazole (PROTONIX) delayed release tablet, 40 mg, Oral, Daily before Breakfast  sennosides-docusate sodium (SENOKOT-S) 8.6-50mg  per tablet, 1 Tablet, Oral, 2x/day  simethicone (MYLICON) chewable tablet, 160 mg, Oral, 4x/day AC        Physical Exam:    Vital Signs:  BP (!) 95/50   Pulse 85   Temp 36.6 C (97.9 F)   Resp 18   Ht 1.575 m (5\' 2" )   Wt (!) 33.1 kg (72 lb 15.6 oz)   LMP  (LMP Unknown)   SpO2 97%   BMI 13.35 kg/m         I/O:  I/O last 24 hours:    Intake/Output Summary (Last 24 hours) at 07/14/2023 1119  Last data filed at 07/14/2023 0800  Gross per 24 hour   Intake 480 ml   Output 200 ml   Net 280 ml     I/O current shift:  12/31 0700 - 12/31 1859  In: 240 [P.O.:240]  Out: 200 [Urine:200]  Blood Sugars:     General: No acute distress, alert and oriented x3  Cardio: Regular rate and rhythm. Normal S1 and S2. No murmurs, gallops, or rubs.  Resp:  Crackles in the bases bilaterally.  Perhaps decreased breath sounds bilaterally.  Abd: Bowel sounds present, Soft, non-tender  Extremities:  No calf pain but trace pitting bilateral lower leg edema      Labs:     Results for orders placed or performed during the hospital encounter of 07/11/23 (from the past 24 hour(s))   BASIC METABOLIC PANEL   Result Value Ref Range    SODIUM 136 136 - 145 mmol/L    POTASSIUM 3.7 3.5 - 5.1 mmol/L    CHLORIDE 94 (L) 96 - 111 mmol/L    CO2 TOTAL 33 (H) 23 - 31 mmol/L    ANION GAP 9 4 - 13 mmol/L    CALCIUM 8.7 8.6 - 10.3 mg/dL    GLUCOSE 86 65 - 161 mg/dL    BUN 18 8 - 25 mg/dL    CREATININE 0.96 0.45 - 1.05 mg/dL    BUN/CREA RATIO 21 6 - 22    ESTIMATED GFR - FEMALE 67 >=60 mL/min/BSA   CBC   Result Value Ref Range    WBC 6.1 3.7 - 11.0 x10^3/uL    RBC 3.89 3.85 -  5.22 x10^6/uL    HGB 12.7 11.5 - 16.0 g/dL    HCT 40.9 81.1 - 91.4 %    MCV 97.2 78.0 - 100.0 fL    MCH 32.6 (H) 26.0 - 32.0 pg    MCHC 33.6 31.0 - 35.5 g/dL    RDW-CV 78.2 95.6 - 21.3 %    PLATELETS 251 150 - 400 x10^3/uL    MPV 9.3 8.7 - 12.5 fL   MAGNESIUM   Result Value Ref Range    MAGNESIUM 2.4 1.8 - 2.6 mg/dL   PHOSPHORUS   Result Value Ref Range    PHOSPHORUS 3.2 2.3 - 4.0 mg/dL       Micro:   Hospital Encounter on 07/11/23 (from the past 96 hour(s))   EXTENDED RESPIRATORY VIRUS PANEL    Collection Time: 07/11/23  9:26 PM    Specimen: Nasopharyngeal Swab   Culture Result Status    ADENOVIRUS ARRAY Not Detected Final    CORONAVIRUS 229E Not Detected Final    CORONAVIRUS HKU1 Not Detected Final    CORONAVIRUS NL63 Not Detected Final    CORONAVIRUS OC43 Not Detected Final    SARS CORONAVIRUS 2 (SARS-CoV-2) Not Detected Final    METAPNEUMOVIRUS ARRAY Not Detected Final    RHINOVIRUS/ENTEROVIRUS ARRAY Not Detected Final    INFLUENZA A Not Detected Final    INFLUENZA B ARRAY Not Detected Final    PARAINFLUENZA 1 ARRAY Not Detected Final    PARAINFLUENZA 2 ARRAY Not Detected Final    PARAINFLUENZA 3 ARRAY Not Detected Final    PARAINFLUENZA 4 ARRAY Not Detected Final    RSV ARRAY Not Detected Final    BORDETELLA PARAPERTUSSIS (IS 1001) Not Detected Final    BORDETELLA PERTUSSIS ARRAY Not Detected Final    CHLAMYDOPHILA PNEUMONIAE ARRAY Not Detected Final    MYCOPLASMA PNEUMONIAE ARRAY Not Detected Final       Radiology:           Marylee Floras, MD    07/14/2023   12:12  [facility]  [facility]

## 2023-07-14 NOTE — Care Plan (Signed)
Problem: Health Knowledge, Opportunity to Enhance (Adult,Obstetrics,Pediatric)  Goal: Knowledgeable about Health Subject/Topic  Description: Patient will demonstrate the desired outcomes by discharge/transition of care.  07/14/2023 2028 by Fredia Sorrow, RN  Outcome: Ongoing (see interventions/notes)  07/14/2023 2028 by Fredia Sorrow, RN  Outcome: Ongoing (see interventions/notes)  Intervention: Enhance Health Knowledge  Recent Flowsheet Documentation  Taken 07/14/2023 1944 by Fredia Sorrow, RN  Family/Support System Care:   involvement promoted   presence promoted   self-care encouraged   support provided  Supportive Measures:   active listening utilized   decision-making supported   self-care encouraged     Problem: ARDS (Acute Respiratory Distress Syndrome)  Goal: Effective Oxygenation  07/14/2023 2028 by Fredia Sorrow, RN  Outcome: Ongoing (see interventions/notes)  07/14/2023 2028 by Fredia Sorrow, RN  Outcome: Ongoing (see interventions/notes)  Intervention: Optimize Oxygenation, Ventilation and Perfusion  Recent Flowsheet Documentation  Taken 07/14/2023 1944 by Fredia Sorrow, RN  Head of Bed [facility]) Positioning: HOB elevated     Problem: Pain Acute  Goal: Optimal Pain Control and Function  07/14/2023 2028 by Fredia Sorrow, RN  Outcome: Ongoing (see interventions/notes)  07/14/2023 2028 by Fredia Sorrow, RN  Outcome: Ongoing (see interventions/notes)  Intervention: Optimize Psychosocial Wellbeing  Recent Flowsheet Documentation  Taken 07/14/2023 1944 by Fredia Sorrow, RN  Spiritual Activities Assistance: personal rituals encouraged  Diversional Activities: television  Supportive Measures:   active listening utilized   decision-making supported   self-care encouraged  Intervention: Prevent or Manage Pain  Recent Flowsheet Documentation  Taken 07/14/2023 1944 by Fredia Sorrow, RN  Sensory Stimulation Regulation:   auditory stimulation minimized   care clustered   lighting decreased   quiet environment promoted   tactile stimulation provided   television on   visual stimulation  minimized  Sleep/Rest Enhancement:   awakenings minimized   consistent schedule promoted   family presence promoted   noise level reduced   regular sleep/rest pattern promoted   relaxation techniques promoted   room darkened  Bowel Elimination Promotion:   adequate fluid intake promoted   ambulation promoted   privacy promoted     Problem: Fall Injury Risk  Goal: Absence of Fall and Fall-Related Injury  07/14/2023 2028 by Fredia Sorrow, RN  Outcome: Ongoing (see interventions/notes)  07/14/2023 2028 by Fredia Sorrow, RN  Outcome: Ongoing (see interventions/notes)  Intervention: Identify and Manage Contributors  Recent Flowsheet Documentation  Taken 07/14/2023 1944 by Fredia Sorrow, RN  Self-Care Promotion:   independence encouraged   BADL personal objects within reach   BADL personal routines maintained   adaptive equipment use encouraged  Intervention: Promote Injury-Free Environment  Recent Flowsheet Documentation  Taken 07/14/2023 1944 by Fredia Sorrow, RN  Safety Promotion/Fall Prevention:   activity supervised   safety round/check completed   nonskid shoes/slippers when out of bed   fall prevention program maintained     Problem: Adult Inpatient Plan of Care  Goal: Plan of Care Review  07/14/2023 2028 by Fredia Sorrow, RN  Outcome: Ongoing (see interventions/notes)  07/14/2023 2028 by Fredia Sorrow, RN  Outcome: Ongoing (see interventions/notes)  Goal: Patient-Specific Goal (Individualized)  07/14/2023 2028 by Fredia Sorrow, RN  Outcome: Ongoing (see interventions/notes)  07/14/2023 2028 by Fredia Sorrow, RN  Outcome: Ongoing (see interventions/notes)  Goal: Absence of Hospital-Acquired Illness or Injury  07/14/2023 2028 by Fredia Sorrow, RN  Outcome: Ongoing (see interventions/notes)  07/14/2023 2028 by Fredia Sorrow, RN  Outcome: Ongoing (see interventions/notes)  Intervention: Identify and Manage Fall Risk  Recent Flowsheet Documentation  Taken 07/14/2023 1944 by Fredia Sorrow, RN  Safety Promotion/Fall Prevention:   activity supervised   safety round/check completed   nonskid  shoes/slippers when out of bed   fall prevention program maintained  Intervention: Prevent Skin Injury  Recent Flowsheet Documentation  Taken 07/14/2023 1944 by Fredia Sorrow, RN  Body Position: supine, head elevated  Skin Protection: adhesive use limited  Intervention: Prevent and Manage VTE (Venous Thromboembolism) Risk  Recent Flowsheet Documentation  Taken 07/14/2023 1944 by Fredia Sorrow, RN  VTE Prevention/Management: ambulation promoted  Intervention: Prevent Infection  Recent Flowsheet Documentation  Taken 07/14/2023 1944 by Fredia Sorrow, RN  Infection Prevention:   personal protective equipment utilized   rest/sleep promoted   promote handwashing   visitors restricted/screened   single patient room provided  Goal: Optimal Comfort and Wellbeing  07/14/2023 2028 by Fredia Sorrow, RN  Outcome: Ongoing (see interventions/notes)  07/14/2023 2028 by Fredia Sorrow, RN  Outcome: Ongoing (see interventions/notes)  Intervention: Provide Person-Centered Care  Recent Flowsheet Documentation  Taken 07/14/2023 1944 by Fredia Sorrow, RN  Trust Relationship/Rapport:   care explained   questions answered   questions encouraged   thoughts/feelings acknowledged  Goal: Rounds/Family Conference  07/14/2023 2028 by Fredia Sorrow, RN  Outcome: Ongoing (see interventions/notes)  07/14/2023 2028 by Fredia Sorrow, RN  Outcome: Ongoing (see interventions/notes)     Problem: Skin Injury Risk Increased  Goal: Skin Health and Integrity  07/14/2023 2028 by Fredia Sorrow, RN  Outcome: Ongoing (see interventions/notes)  07/14/2023 2028 by Fredia Sorrow, RN  Outcome: Ongoing (see interventions/notes)  Intervention: Optimize Skin Protection  Recent Flowsheet Documentation  Taken 07/14/2023 1944 by Fredia Sorrow, RN  Pressure Reduction Techniques: Frequent weight shifting encouraged  Pressure Reduction Devices:   Pressure redistributing mattress utilized   Repositioning wedges/pillows utilized  Skin Protection: adhesive use limited  Activity Management: up ad lib  Head of Bed (HOB) Positioning: HOB  elevated  Intervention: Promote and Optimize Oral Intake  Recent Flowsheet Documentation  Taken 07/14/2023 1944 by Fredia Sorrow, RN  Oral Nutrition Promotion: rest periods promoted     Problem: Heart Failure  Goal: Optimal Coping  07/14/2023 2028 by Fredia Sorrow, RN  Outcome: Ongoing (see interventions/notes)  07/14/2023 2028 by Fredia Sorrow, RN  Outcome: Ongoing (see interventions/notes)  Intervention: Support Psychosocial Response  Recent Flowsheet Documentation  Taken 07/14/2023 1944 by Fredia Sorrow, RN  Family/Support System Care:   involvement promoted   presence promoted   self-care encouraged   support provided  Supportive Measures:   active listening utilized   decision-making supported   self-care encouraged  Goal: Optimal Cardiac Output and Blood Flow  07/14/2023 2028 by Fredia Sorrow, RN  Outcome: Ongoing (see interventions/notes)  07/14/2023 2028 by Fredia Sorrow, RN  Outcome: Ongoing (see interventions/notes)  Intervention: Optimize Cardiac Output  Recent Flowsheet Documentation  Taken 07/14/2023 1944 by Fredia Sorrow, RN  Environmental Support:   calm environment promoted   caregiver consistency promoted   comfort object encouraged   distractions minimized   personal routine supported   environmental consistency promoted   rest periods encouraged  Goal: Stable Heart Rate and Rhythm  07/14/2023 2028 by Fredia Sorrow, RN  Outcome: Ongoing (see interventions/notes)  07/14/2023 2028 by Fredia Sorrow, RN  Outcome: Ongoing (see interventions/notes)  Goal: Fluid and Electrolyte Balance  07/14/2023 2028 by Fredia Sorrow, RN  Outcome: Ongoing (see interventions/notes)  07/14/2023 2028 by Fredia Sorrow, RN  Outcome: Ongoing (see interventions/notes)  Intervention: Monitor and Manage Fluid and Electrolyte Balance  Recent Flowsheet  Documentation  Taken 07/14/2023 1944 by Fredia Sorrow, RN  Fluid/Electrolyte Management: fluids provided  Goal: Optimal Functional Ability  07/14/2023 2028 by Fredia Sorrow, RN  Outcome: Ongoing (see interventions/notes)  07/14/2023 2028 by Fredia Sorrow, RN  Outcome: Ongoing  (see interventions/notes)  Intervention: Optimize Functional Ability  Recent Flowsheet Documentation  Taken 07/14/2023 1944 by Fredia Sorrow, RN  Self-Care Promotion:   independence encouraged   BADL personal objects within reach   BADL personal routines maintained   adaptive equipment use encouraged  Activity Management: up ad lib  Goal: Improved Oral Intake  07/14/2023 2028 by Fredia Sorrow, RN  Outcome: Ongoing (see interventions/notes)  07/14/2023 2028 by Fredia Sorrow, RN  Outcome: Ongoing (see interventions/notes)  Intervention: Promote and Optimize Nutrition Intake  Recent Flowsheet Documentation  Taken 07/14/2023 1944 by Fredia Sorrow, RN  Oral Nutrition Promotion: rest periods promoted  Goal: Effective Oxygenation and Ventilation  07/14/2023 2028 by Fredia Sorrow, RN  Outcome: Ongoing (see interventions/notes)  07/14/2023 2028 by Fredia Sorrow, RN  Outcome: Ongoing (see interventions/notes)  Intervention: Promote Airway Secretion Clearance  Recent Flowsheet Documentation  Taken 07/14/2023 1944 by Fredia Sorrow, RN  Activity Management: up ad lib  Intervention: Optimize Oxygenation and Ventilation  Recent Flowsheet Documentation  Taken 07/14/2023 1944 by Fredia Sorrow, RN  Head of Bed [facility]) Positioning: HOB elevated  Goal: Effective Breathing Pattern During Sleep  07/14/2023 2028 by Fredia Sorrow, RN  Outcome: Ongoing (see interventions/notes)  07/14/2023 2028 by Fredia Sorrow, RN  Outcome: Ongoing (see interventions/notes)     Problem: Gas Exchange Impaired  Goal: Optimal Gas Exchange  07/14/2023 2028 by Fredia Sorrow, RN  Outcome: Ongoing (see interventions/notes)  07/14/2023 2028 by Fredia Sorrow, RN  Outcome: Ongoing (see interventions/notes)  Intervention: Optimize Oxygenation and Ventilation  Recent Flowsheet Documentation  Taken 07/14/2023 1944 by Fredia Sorrow, RN  Head of Bed [facility]) Positioning: HOB elevated

## 2023-07-14 NOTE — Care Plan (Signed)
Problem: Health Knowledge, Opportunity to Enhance (Adult,Obstetrics,Pediatric)  Goal: Knowledgeable about Health Subject/Topic  Description: Patient will demonstrate the desired outcomes by discharge/transition of care.  Outcome: Ongoing (see interventions/notes)     Problem: ARDS (Acute Respiratory Distress Syndrome)  Goal: Effective Oxygenation  Outcome: Ongoing (see interventions/notes)     Problem: Pain Acute  Goal: Optimal Pain Control and Function  Outcome: Ongoing (see interventions/notes)     Problem: Fall Injury Risk  Goal: Absence of Fall and Fall-Related Injury  Outcome: Ongoing (see interventions/notes)     Problem: Adult Inpatient Plan of Care  Goal: Plan of Care Review  Outcome: Ongoing (see interventions/notes)  Goal: Patient-Specific Goal (Individualized)  Outcome: Ongoing (see interventions/notes)  Goal: Absence of Hospital-Acquired Illness or Injury  Outcome: Ongoing (see interventions/notes)  Goal: Optimal Comfort and Wellbeing  Outcome: Ongoing (see interventions/notes)  Goal: Rounds/Family Conference  Outcome: Ongoing (see interventions/notes)     Problem: Skin Injury Risk Increased  Goal: Skin Health and Integrity  Outcome: Ongoing (see interventions/notes)     Problem: Heart Failure  Goal: Optimal Coping  Outcome: Ongoing (see interventions/notes)  Goal: Optimal Cardiac Output and Blood Flow  Outcome: Ongoing (see interventions/notes)  Goal: Stable Heart Rate and Rhythm  Outcome: Ongoing (see interventions/notes)  Goal: Fluid and Electrolyte Balance  Outcome: Ongoing (see interventions/notes)  Goal: Optimal Functional Ability  Outcome: Ongoing (see interventions/notes)  Goal: Improved Oral Intake  Outcome: Ongoing (see interventions/notes)  Goal: Effective Oxygenation and Ventilation  Outcome: Ongoing (see interventions/notes)  Goal: Effective Breathing Pattern During Sleep  Outcome: Ongoing (see interventions/notes)     Problem: Gas Exchange Impaired  Goal: Optimal Gas Exchange  Outcome:  Ongoing (see interventions/notes)

## 2023-07-14 NOTE — Nurses Notes (Signed)
I have reviewed this patient's orders and plan of care. Currently this patient meets requirements for a low to mid level of nursing care.  Ryle Buscemi L Carder, RN

## 2023-07-14 NOTE — Care Plan (Signed)
Problem: Health Knowledge, Opportunity to Enhance (Adult,Obstetrics,Pediatric)  Goal: Knowledgeable about Health Subject/Topic  Description: Patient will demonstrate the desired outcomes by discharge/transition of care.  Outcome: Ongoing (see interventions/notes)  Intervention: Enhance Health Knowledge  Recent Flowsheet Documentation  Taken 07/14/2023 0900 by Ragena Fiola R, LPN  Family/Support System Care: self-care encouraged  Supportive Measures: active listening utilized     Problem: ARDS (Acute Respiratory Distress Syndrome)  Goal: Effective Oxygenation  Outcome: Ongoing (see interventions/notes)  Intervention: Optimize Oxygenation, Ventilation and Perfusion  Recent Flowsheet Documentation  Taken 07/14/2023 0900 by Hidaya Daniel R, LPN  Head of Bed (HOB) Positioning: HOB elevated     Problem: Pain Acute  Goal: Optimal Pain Control and Function  Outcome: Ongoing (see interventions/notes)  Intervention: Optimize Psychosocial Wellbeing  Recent Flowsheet Documentation  Taken 07/14/2023 0900 by Lachrista Heslin R, LPN  Diversional Activities: television  Supportive Measures: active listening utilized  Intervention: Prevent or Manage Pain  Recent Flowsheet Documentation  Taken 07/14/2023 0900 by Jobin Montelongo R, LPN  Sleep/Rest Enhancement: consistent schedule promoted  Bowel Elimination Promotion:   adequate fluid intake promoted   commode/bedpan at bedside     Problem: Fall Injury Risk  Goal: Absence of Fall and Fall-Related Injury  Outcome: Ongoing (see interventions/notes)  Intervention: Promote Scientist, clinical (histocompatibility and immunogenetics) Documentation  Taken 07/14/2023 0900 by Arna Snipe, LPN  Safety Promotion/Fall Prevention:   nonskid shoes/slippers when out of bed   safety round/check completed     Problem: Adult Inpatient Plan of Care  Goal: Plan of Care Review  Outcome: Ongoing (see interventions/notes)  Goal: Patient-Specific Goal (Individualized)  Outcome: Ongoing (see interventions/notes)  Goal: Absence of Hospital-Acquired Illness or  Injury  Outcome: Ongoing (see interventions/notes)  Intervention: Identify and Manage Fall Risk  Recent Flowsheet Documentation  Taken 07/14/2023 0900 by Jandi Swiger R, LPN  Safety Promotion/Fall Prevention:   nonskid shoes/slippers when out of bed   safety round/check completed  Intervention: Prevent Skin Injury  Recent Flowsheet Documentation  Taken 07/14/2023 0900 by Lan Mcneill R, LPN  Body Position: supine, head elevated  Intervention: Prevent and Manage VTE (Venous Thromboembolism) Risk  Recent Flowsheet Documentation  Taken 07/14/2023 0900 by Jamilee Lafosse R, LPN  VTE Prevention/Management: ambulation promoted  Intervention: Prevent Infection  Recent Flowsheet Documentation  Taken 07/14/2023 0900 by Marvina Danner R, LPN  Infection Prevention: promote handwashing  Goal: Optimal Comfort and Wellbeing  Outcome: Ongoing (see interventions/notes)  Intervention: Provide Person-Centered Care  Recent Flowsheet Documentation  Taken 07/14/2023 0900 by Arna Snipe, LPN  Trust Relationship/Rapport:   care explained   choices provided   questions answered   reassurance provided  Goal: Rounds/Family Conference  Outcome: Ongoing (see interventions/notes)     Problem: Skin Injury Risk Increased  Goal: Skin Health and Integrity  Outcome: Ongoing (see interventions/notes)  Intervention: Optimize Skin Protection  Recent Flowsheet Documentation  Taken 07/14/2023 0900 by Ancil Dewan R, LPN  Pressure Reduction Techniques: Frequent weight shifting encouraged  Pressure Reduction Devices: Repositioning wedges/pillows utilized  Activity Management: ambulated to bathroom  Head of Bed (HOB) Positioning: HOB elevated     Problem: Heart Failure  Goal: Optimal Coping  Outcome: Ongoing (see interventions/notes)  Intervention: Support Psychosocial Response  Recent Flowsheet Documentation  Taken 07/14/2023 0900 by Arna Snipe, LPN  Family/Support System Care: self-care encouraged  Supportive Measures: active listening utilized  Goal: Optimal Cardiac Output and Blood Flow  Outcome: Ongoing (see  interventions/notes)  Intervention: Optimize Cardiac Output  Recent Flowsheet Documentation  Taken 07/14/2023 0900 by Arna Snipe, LPN  Environmental Support: calm environment promoted  Goal: Stable Heart Rate and Rhythm  Outcome: Ongoing (see interventions/notes)  Goal: Fluid and Electrolyte Balance  Outcome: Ongoing (see interventions/notes)  Goal: Optimal Functional Ability  Outcome: Ongoing (see interventions/notes)  Intervention: Optimize Functional Ability  Recent Flowsheet Documentation  Taken 07/14/2023 0900 by Ruairi Stutsman R, LPN  Activity Management: ambulated to bathroom  Goal: Improved Oral Intake  Outcome: Ongoing (see interventions/notes)  Goal: Effective Oxygenation and Ventilation  Outcome: Ongoing (see interventions/notes)  Intervention: Promote Airway Secretion Clearance  Recent Flowsheet Documentation  Taken 07/14/2023 0900 by Aniyha Tate R, LPN  Activity Management: ambulated to bathroom  Intervention: Optimize Oxygenation and Ventilation  Recent Flowsheet Documentation  Taken 07/14/2023 0900 by Caidin Heidenreich R, LPN  Head of Bed (HOB) Positioning: HOB elevated  Goal: Effective Breathing Pattern During Sleep  Outcome: Ongoing (see interventions/notes)     Problem: Gas Exchange Impaired  Goal: Optimal Gas Exchange  Outcome: Ongoing (see interventions/notes)  Intervention: Optimize Oxygenation and Ventilation  Recent Flowsheet Documentation  Taken 07/14/2023 0900 by Antoni Stefan R, LPN  Head of Bed (HOB) Positioning: HOB elevated

## 2023-07-15 DIAGNOSIS — I959 Hypotension, unspecified: Secondary | ICD-10-CM

## 2023-07-15 DIAGNOSIS — Z66 Do not resuscitate: Secondary | ICD-10-CM

## 2023-07-15 LAB — ECG 12 LEAD - ADULT
Atrial Rate: 68 {beats}/min
Calculated P Axis: 65 degrees
Calculated R Axis: -29 degrees
Calculated T Axis: 125 degrees
PR Interval: 240 ms
QRS Duration: 114 ms
QT Interval: 486 ms
QTC Calculation: 516 ms
Ventricular rate: 68 {beats}/min

## 2023-07-15 LAB — BASIC METABOLIC PANEL
ANION GAP: 7 mmol/L (ref 4–13)
BUN/CREA RATIO: 21 (ref 6–22)
BUN: 17 mg/dL (ref 8–25)
CALCIUM: 8.3 mg/dL — ABNORMAL LOW (ref 8.6–10.3)
CHLORIDE: 95 mmol/L — ABNORMAL LOW (ref 96–111)
CO2 TOTAL: 32 mmol/L — ABNORMAL HIGH (ref 23–31)
CREATININE: 0.81 mg/dL (ref 0.60–1.05)
ESTIMATED GFR - FEMALE: 71 mL/min/BSA (ref >=60)
GLUCOSE: 81 mg/dL (ref 65–125)
POTASSIUM: 4.1 mmol/L (ref 3.5–5.1)
SODIUM: 134 mmol/L — ABNORMAL LOW (ref 136–145)

## 2023-07-15 LAB — CBC
HCT: 34.2 % — ABNORMAL LOW (ref 34.8–46.0)
HGB: 11.5 g/dL (ref 11.5–16.0)
MCH: 32.1 pg — ABNORMAL HIGH (ref 26.0–32.0)
MCHC: 33.6 g/dL (ref 31.0–35.5)
MCV: 95.5 fL (ref 78.0–100.0)
MPV: 9.4 fL (ref 8.7–12.5)
PLATELETS: 229 10*3/uL (ref 150–400)
RBC: 3.58 10*6/uL — ABNORMAL LOW (ref 3.85–5.22)
RDW-CV: 13.2 % (ref 11.5–15.5)
WBC: 5.7 10*3/uL (ref 3.7–11.0)

## 2023-07-15 LAB — TRANSTHORACIC ECHOCARDIOGRAM - ADULT
Mitral Regurgitant Velocity Time Integral: 171 cm
Mr max vel: 471 cm/s
PISA AR VN Nyquist: 423 cm/s
Tricuspid Valve Regurgitation Velocity Time Interval: 72.5 cm
Triscuspid Valve Regurgitation Peak Gradient: 20 mm[Hg]

## 2023-07-15 LAB — PHOSPHORUS: PHOSPHORUS: 3.3 mg/dL (ref 2.3–4.0)

## 2023-07-15 LAB — MAGNESIUM: MAGNESIUM: 2.3 mg/dL (ref 1.8–2.6)

## 2023-07-15 NOTE — Care Plan (Signed)
Problem: Health Knowledge, Opportunity to Enhance (Adult,Obstetrics,Pediatric)  Goal: Knowledgeable about Health Subject/Topic  Description: Patient will demonstrate the desired outcomes by discharge/transition of care.  Intervention: Enhance Health Knowledge  Recent Flowsheet Documentation  Taken 07/15/2023 0831 by Titus Dubin, LPN  Family/Support System Care: self-care encouraged     Problem: ARDS (Acute Respiratory Distress Syndrome)  Goal: Effective Oxygenation  Intervention: Optimize Oxygenation, Ventilation and Perfusion  Recent Flowsheet Documentation  Taken 07/15/2023 0831 by Titus Dubin, LPN  Head of Bed [facility]) Positioning: HOB elevated     Problem: Pain Acute  Goal: Optimal Pain Control and Function  Intervention: Optimize Psychosocial Wellbeing  Recent Flowsheet Documentation  Taken 07/15/2023 0831 by Titus Dubin, LPN  Diversional Activities: television     Problem: Fall Injury Risk  Goal: Absence of Fall and Fall-Related Injury  Intervention: Identify and Manage Contributors  Recent Flowsheet Documentation  Taken 07/15/2023 0831 by Titus Dubin, LPN  Self-Care Promotion:   BADL personal objects within reach   BADL personal routines maintained   meal set-up provided   adaptive equipment use encouraged  Intervention: Promote Injury-Free Environment  Recent Flowsheet Documentation  Taken 07/15/2023 1000 by Titus Dubin, LPN  Safety Promotion/Fall Prevention:   safety round/check completed   activity supervised  Taken 07/15/2023 0831 by Titus Dubin, LPN  Safety Promotion/Fall Prevention:   safety round/check completed   activity supervised     Problem: Adult Inpatient Plan of Care  Goal: Absence of Hospital-Acquired Illness or Injury  Intervention: Identify and Manage Fall Risk  Recent Flowsheet Documentation  Taken 07/15/2023 1000 by Titus Dubin, LPN  Safety Promotion/Fall Prevention:   safety round/check completed   activity supervised  Taken 07/15/2023 0831 by Titus Dubin, LPN  Safety Promotion/Fall Prevention:   safety round/check  completed   activity supervised  Intervention: Prevent Skin Injury  Recent Flowsheet Documentation  Taken 07/15/2023 0831 by Titus Dubin, LPN  Body Position: sitting  Skin Protection: adhesive use limited  Intervention: Prevent and Manage VTE (Venous Thromboembolism) Risk  Recent Flowsheet Documentation  Taken 07/15/2023 0831 by Titus Dubin, LPN  VTE Prevention/Management:   ambulation promoted   dorsiflexion/plantar flexion performed  Goal: Optimal Comfort and Wellbeing  Intervention: Provide Person-Centered Care  Recent Flowsheet Documentation  Taken 07/15/2023 0831 by Titus Dubin, LPN  Trust Relationship/Rapport: care explained     Problem: Skin Injury Risk Increased  Goal: Skin Health and Integrity  Intervention: Optimize Skin Protection  Recent Flowsheet Documentation  Taken 07/15/2023 0831 by Titus Dubin, LPN  Pressure Reduction Techniques: Frequent weight shifting encouraged  Pressure Reduction Devices: Pressure redistributing mattress utilized  Skin Protection: adhesive use limited  Activity Management: up ad lib  Head of Bed (HOB) Positioning: HOB elevated     Problem: Heart Failure  Goal: Optimal Coping  Intervention: Support Psychosocial Response  Recent Flowsheet Documentation  Taken 07/15/2023 0831 by Titus Dubin, LPN  Family/Support System Care: self-care encouraged  Goal: Optimal Functional Ability  Intervention: Optimize Functional Ability  Recent Flowsheet Documentation  Taken 07/15/2023 0831 by Titus Dubin, LPN  Self-Care Promotion:   BADL personal objects within reach   BADL personal routines maintained   meal set-up provided   adaptive equipment use encouraged  Activity Management: up ad lib  Goal: Effective Oxygenation and Ventilation  Intervention: Promote Airway Secretion Clearance  Recent Flowsheet Documentation  Taken 07/15/2023 0831 by Titus Dubin, LPN  Activity Management: up ad lib  Intervention: Optimize Oxygenation and Ventilation  Recent Flowsheet Documentation  Taken 07/15/2023 0831 by Titus Dubin,  LPN  Head of Bed  (HOB) Positioning: HOB elevated     Problem: Gas Exchange Impaired  Goal: Optimal Gas Exchange  Intervention: Optimize Oxygenation and Ventilation  Recent Flowsheet Documentation  Taken 07/15/2023 0831 by Titus Dubin, LPN  Head of Bed [facility]) Positioning: HOB elevated

## 2023-07-15 NOTE — Progress Notes (Signed)
[facility]  Cardiology   Progress Note      [name], [name]  Date of Admission:  07/11/2023  Date of service: 07/15/2023  Date of Birth:  02-Jan-1935  MRN:  [id number]    Hospital Day:  LOS: 3 days     Assessment/ Plan:  Acute on chronic systolic congestive heart failure continue Lasix  Small left pleural effusion status post thoracentesis  Severe protein and calorie malnutrition encouraging to take Ensure  Chronic constipation  Hypothyroid   Hypotension  Patient is DNR right now  Failure to thrive     At this time the patient in current treatment increase activity monitor closely  Active Hospital Problems    Diagnosis    Primary Problem: Pleural effusion             Current Medications:  acetaminophen (TYLENOL) tablet, 650 mg, Oral, Q6H PRN  aluminum-magnesium hydroxide-simethicone (MAG-AL PLUS) 200-200-20 mg per 5 mL oral liquid, 30 mL, Oral, Q4H PRN  aspirin (ECOTRIN) enteric coated tablet 81 mg, 81 mg, Oral, Daily  atorvastatin (LIPITOR) tablet, 40 mg, Oral, QPM  carvedilol (COREG) tablet, 3.125 mg, Oral, 2x/day-Food  docusate sodium (COLACE) capsule, 100 mg, Oral, 2x/day PRN  enoxaparin PF (LOVENOX) 30 mg/0.3 mL SubQ injection, 30 mg, Subcutaneous, Q24H  furosemide (LASIX) 10 mg/mL injection, 40 mg, Intravenous, 2x/day  lactulose (ENULOSE) 20g per 30mL oral liquid, 30 mL, Oral, 3x/day  levothyroxine (SYNTHROID) tablet, 75 mcg, Oral, QAM  lisinopril (PRINIVIL) tablet, 5 mg, Oral, Daily  magnesium hydroxide (MILK OF MAGNESIA) 400mg  per 5mL oral liquid, 15 mL, Oral, 4x/day PRN  metoclopramide (REGLAN) tablet, 5 mg, Oral, 3x/day AC  NS flush syringe, 3 mL, Intracatheter, Q8HRS  NS flush syringe, 3 mL, Intracatheter, Q1H PRN  ondansetron (ZOFRAN) 2 mg/mL injection, 4 mg, Intravenous, Q6H PRN  pantoprazole (PROTONIX) delayed release tablet, 40 mg, Oral, Daily before Breakfast  sennosides-docusate sodium (SENOKOT-S) 8.6-50mg  per tablet, 1 Tablet, Oral, 2x/day  simethicone (MYLICON) chewable tablet, 160 mg,  Oral, 4x/day AC        Allergies   Allergen Reactions    Lactose Rash    Grass Pollen              Review system  Otherwise negative    Vital Signs:  All vital signs reviewed    Physical exam  Patient alert  stablenot in acute distress  HEENT unremarkable  Neck no JVD or bruit carotid upstroke within normal limit neck is supple  Lungs clear no rales rhonchi or wheezing  Heart Regular rhythm no gallop, rub or murmur     Abdomen soft nontender no masses or organomegaly, bowel sounds positive, no masses.  Extremities no cyanosis, clubbing or edema  Skin no rash.  Neuro  weakness      Temp (24hrs) Max:36.5 C (97.7 F)      Temperature: 36.5 C (97.7 F) (07/15/23 0330)  BP (Non-Invasive): (!) 115/58 (07/15/23 1145)  MAP (Non-Invasive): 74 mmHG (07/15/23 1145)  Heart Rate: 88 (07/15/23 0831)  Respiratory Rate: 16 (07/15/23 0831)  SpO2: 99 % (07/15/23 0831)      I/O last 24 hours:    Intake/Output Summary (Last 24 hours) at 07/15/2023 1242  Last data filed at 07/15/2023 0510  Gross per 24 hour   Intake 370 ml   Output 300 ml   Net 70 ml     I/O current shift:  No intake/output data recorded.  No results for input(s): "GLUIP" in the last 48 hours.  Labs:  All Lab Results reviewed by myself   for Last 24 Hours:    Results for orders placed or performed during the hospital encounter of 07/11/23 (from the past 24 hour(s))   BASIC METABOLIC PANEL   Result Value Ref Range    SODIUM 134 (L) 136 - 145 mmol/L    POTASSIUM 4.1 3.5 - 5.1 mmol/L    CHLORIDE 95 (L) 96 - 111 mmol/L    CO2 TOTAL 32 (H) 23 - 31 mmol/L    ANION GAP 7 4 - 13 mmol/L    CALCIUM 8.3 (L) 8.6 - 10.3 mg/dL    GLUCOSE 81 65 - 098 mg/dL    BUN 17 8 - 25 mg/dL    CREATININE 1.19 1.47 - 1.05 mg/dL    BUN/CREA RATIO 21 6 - 22    ESTIMATED GFR - FEMALE 71 >=60 mL/min/BSA   CBC   Result Value Ref Range    WBC 5.7 3.7 - 11.0 x10^3/uL    RBC 3.58 (L) 3.85 - 5.22 x10^6/uL    HGB 11.5 11.5 - 16.0 g/dL    HCT 82.9 (L) 56.2 - 46.0 %    MCV 95.5 78.0 - 100.0 fL    MCH  32.1 (H) 26.0 - 32.0 pg    MCHC 33.6 31.0 - 35.5 g/dL    RDW-CV 13.0 86.5 - 78.4 %    PLATELETS 229 150 - 400 x10^3/uL    MPV 9.4 8.7 - 12.5 fL   MAGNESIUM   Result Value Ref Range    MAGNESIUM 2.3 1.8 - 2.6 mg/dL   PHOSPHORUS   Result Value Ref Range    PHOSPHORUS 3.3 2.3 - 4.0 mg/dL         Rosie Fate, MD      This note may have been partially generated using MModal Fluency Direct system, and there may be some incorrect words, spellings, and punctuation that were not noted in checking the note before saving.

## 2023-07-15 NOTE — Nurses Notes (Signed)
Per Dr. Jules Schick ; give cardiac medications as long as systolic is 85 or above. This is documented by Dr. Jules Schick in his progress note as well.   Maryclare Labrador, LPN

## 2023-07-15 NOTE — Care Plan (Signed)
Patient stable on current shift.  Alert and oriented.  No complaints. Tolerating meds. NSR on telemetry. I have reviewed this patient's orders and plan of care.  Currently this patient meets requirements for a low to mid level of nursing care.   Sava Proby, Charity fundraiser

## 2023-07-15 NOTE — Nurses Notes (Signed)
Patient is alert and oriented. Hard of hearing. Flat affect. Room air. Standby assist to [facility]. Had BM during shift.     Give cardiac meds if systolic is 85 or above per Dr. Jules Schick ; Check his progress note.     Maryclare Labrador, LPN

## 2023-07-15 NOTE — Progress Notes (Signed)
Internal Medicine Progress Note    Names:  [name]  Date of service: 07/15/2023  Date of Admission:  07/11/2023  Hospital Day:  LOS: 3 days     Assessment/ Plan:   Active Hospital Problems    Diagnosis    Primary Problem: Pleural effusion       1. Acute recurrent on chronic systolic congestive heart failure.  Borderline blood pressure makes difficult utilizing ACE inhibitor and Aldactone on a regular basis.  Continue to dose appropriately    2. Residual small Left pleural effusion with recent thoracentesis.  300 cc were removed several weeks ago.  Remaining effusion seems to be very small.  Discussed risk benefit ratio with patient and family.  Will monitor.    3. Severe protein calorie malnutrition.  Continue to provide supplements     4. Chronic constipation.  Multiple bowel movements after enema.  Continue to monitor    5. Acquired hypothyroidism.  Stable on present doses.      6. Hypotension.  Will use systolic blood pressure of 85 as a cut off point to give him medications or not.    7. Family aware of the grim prognosis.  Patient is now DNR        PT/OT: Yes    Disposition Planning:   Home with home health    Subjective:  No chest pain or palpitations.  Improved shortness a breath at rest.  Denies dizziness nausea vomiting.  Passing flatus.  No abdominal pain.  Multiple bowel movements after enema.  Voiding.        acetaminophen (TYLENOL) tablet, 650 mg, Oral, Q6H PRN  aluminum-magnesium hydroxide-simethicone (MAG-AL PLUS) 200-200-20 mg per 5 mL oral liquid, 30 mL, Oral, Q4H PRN  aspirin (ECOTRIN) enteric coated tablet 81 mg, 81 mg, Oral, Daily  atorvastatin (LIPITOR) tablet, 40 mg, Oral, QPM  carvedilol (COREG) tablet, 3.125 mg, Oral, 2x/day-Food  docusate sodium (COLACE) capsule, 100 mg, Oral, 2x/day PRN  enoxaparin PF (LOVENOX) 30 mg/0.3 mL SubQ injection, 30 mg, Subcutaneous, Q24H  furosemide (LASIX) 10 mg/mL injection, 40 mg, Intravenous, 2x/day  lactulose (ENULOSE) 20g per 30mL oral liquid,  30 mL, Oral, 3x/day  levothyroxine (SYNTHROID) tablet, 75 mcg, Oral, QAM  lisinopril (PRINIVIL) tablet, 5 mg, Oral, Daily  magnesium hydroxide (MILK OF MAGNESIA) 400mg  per 5mL oral liquid, 15 mL, Oral, 4x/day PRN  metoclopramide (REGLAN) tablet, 5 mg, Oral, 3x/day AC  NS flush syringe, 3 mL, Intracatheter, Q8HRS  NS flush syringe, 3 mL, Intracatheter, Q1H PRN  ondansetron (ZOFRAN) 2 mg/mL injection, 4 mg, Intravenous, Q6H PRN  pantoprazole (PROTONIX) delayed release tablet, 40 mg, Oral, Daily before Breakfast  sennosides-docusate sodium (SENOKOT-S) 8.6-50mg  per tablet, 1 Tablet, Oral, 2x/day  simethicone (MYLICON) chewable tablet, 160 mg, Oral, 4x/day AC        Physical Exam:    Vital Signs:  BP (!) 97/46   Pulse 88   Temp 36.5 C (97.7 F)   Resp 16   Ht 1.575 m (5\' 2" )   Wt (!) 33.1 kg (72 lb 15.6 oz)   LMP  (LMP Unknown)   SpO2 99%   BMI 13.35 kg/m         I/O:  I/O last 24 hours:    Intake/Output Summary (Last 24 hours) at 07/15/2023 1101  Last data filed at 07/15/2023 0510  Gross per 24 hour   Intake 610 ml   Output 300 ml   Net 310 ml     I/O current shift:  No  intake/output data recorded.  Blood Sugars:     General:  Alert.  Cooperative.  Cardio: Regular rate and rhythm. Normal S1 and S2. No murmurs, gallops, or rubs.   Resp:  Crackles in the bases bilaterally.  Perhaps decreased breath sounds bilaterally.  No bronchospasm  Abd: Bowel sounds present, Soft, non-tender  Extremities:  No calf pain but trace pitting bilateral lower leg edema      Labs:     Results for orders placed or performed during the hospital encounter of 07/11/23 (from the past 24 hour(s))   BASIC METABOLIC PANEL   Result Value Ref Range    SODIUM 134 (L) 136 - 145 mmol/L    POTASSIUM 4.1 3.5 - 5.1 mmol/L    CHLORIDE 95 (L) 96 - 111 mmol/L    CO2 TOTAL 32 (H) 23 - 31 mmol/L    ANION GAP 7 4 - 13 mmol/L    CALCIUM 8.3 (L) 8.6 - 10.3 mg/dL    GLUCOSE 81 65 - 161 mg/dL    BUN 17 8 - 25 mg/dL    CREATININE 0.96 0.45 - 1.05 mg/dL     BUN/CREA RATIO 21 6 - 22    ESTIMATED GFR - FEMALE 71 >=60 mL/min/BSA   CBC   Result Value Ref Range    WBC 5.7 3.7 - 11.0 x10^3/uL    RBC 3.58 (L) 3.85 - 5.22 x10^6/uL    HGB 11.5 11.5 - 16.0 g/dL    HCT 40.9 (L) 81.1 - 46.0 %    MCV 95.5 78.0 - 100.0 fL    MCH 32.1 (H) 26.0 - 32.0 pg    MCHC 33.6 31.0 - 35.5 g/dL    RDW-CV 91.4 78.2 - 95.6 %    PLATELETS 229 150 - 400 x10^3/uL    MPV 9.4 8.7 - 12.5 fL   MAGNESIUM   Result Value Ref Range    MAGNESIUM 2.3 1.8 - 2.6 mg/dL   PHOSPHORUS   Result Value Ref Range    PHOSPHORUS 3.3 2.3 - 4.0 mg/dL       Micro:   Hospital Encounter on 07/11/23 (from the past 96 hour(s))   EXTENDED RESPIRATORY VIRUS PANEL    Collection Time: 07/11/23  9:26 PM    Specimen: Nasopharyngeal Swab   Culture Result Status    ADENOVIRUS ARRAY Not Detected Final    CORONAVIRUS 229E Not Detected Final    CORONAVIRUS HKU1 Not Detected Final    CORONAVIRUS NL63 Not Detected Final    CORONAVIRUS OC43 Not Detected Final    SARS CORONAVIRUS 2 (SARS-CoV-2) Not Detected Final    METAPNEUMOVIRUS ARRAY Not Detected Final    RHINOVIRUS/ENTEROVIRUS ARRAY Not Detected Final    INFLUENZA A Not Detected Final    INFLUENZA B ARRAY Not Detected Final    PARAINFLUENZA 1 ARRAY Not Detected Final    PARAINFLUENZA 2 ARRAY Not Detected Final    PARAINFLUENZA 3 ARRAY Not Detected Final    PARAINFLUENZA 4 ARRAY Not Detected Final    RSV ARRAY Not Detected Final    BORDETELLA PARAPERTUSSIS (IS 1001) Not Detected Final    BORDETELLA PERTUSSIS ARRAY Not Detected Final    CHLAMYDOPHILA PNEUMONIAE ARRAY Not Detected Final    MYCOPLASMA PNEUMONIAE ARRAY Not Detected Final       Radiology:           Marylee Floras, MD    07/15/2023   12:12  [facility]  [facility]

## 2023-07-16 ENCOUNTER — Other Ambulatory Visit: Payer: Self-pay

## 2023-07-16 DIAGNOSIS — J9601 Acute respiratory failure with hypoxia: Secondary | ICD-10-CM

## 2023-07-16 LAB — CBC
HCT: 34.3 % — ABNORMAL LOW (ref 34.8–46.0)
HGB: 11.7 g/dL (ref 11.5–16.0)
MCH: 32 pg (ref 26.0–32.0)
MCHC: 34.1 g/dL (ref 31.0–35.5)
MCV: 93.7 fL (ref 78.0–100.0)
MPV: 9.2 fL (ref 8.7–12.5)
PLATELETS: 202 10*3/uL (ref 150–400)
RBC: 3.66 10*6/uL — ABNORMAL LOW (ref 3.85–5.22)
RDW-CV: 13.2 % (ref 11.5–15.5)
WBC: 5.7 10*3/uL (ref 3.7–11.0)

## 2023-07-16 LAB — PHOSPHORUS: PHOSPHORUS: 3.3 mg/dL (ref 2.3–4.0)

## 2023-07-16 LAB — BASIC METABOLIC PANEL
ANION GAP: 9 mmol/L (ref 4–13)
BUN/CREA RATIO: 16 (ref 6–22)
BUN: 14 mg/dL (ref 8–25)
CALCIUM: 8.3 mg/dL — ABNORMAL LOW (ref 8.6–10.3)
CHLORIDE: 94 mmol/L — ABNORMAL LOW (ref 96–111)
CO2 TOTAL: 30 mmol/L (ref 23–31)
CREATININE: 0.9 mg/dL (ref 0.60–1.05)
ESTIMATED GFR - FEMALE: 62 mL/min/BSA (ref >=60)
GLUCOSE: 109 mg/dL (ref 65–125)
POTASSIUM: 3.5 mmol/L (ref 3.5–5.1)
SODIUM: 133 mmol/L — ABNORMAL LOW (ref 136–145)

## 2023-07-16 LAB — MAGNESIUM: MAGNESIUM: 2.3 mg/dL (ref 1.8–2.6)

## 2023-07-16 NOTE — Care Plan (Signed)
Problem: Health Knowledge, Opportunity to Enhance (Adult,Obstetrics,Pediatric)  Goal: Knowledgeable about Health Subject/Topic  Description: Patient will demonstrate the desired outcomes by discharge/transition of care.  Intervention: Enhance Health Knowledge  Recent Flowsheet Documentation  Taken 07/16/2023 0817 by Titus Dubin, LPN  Family/Support System Care: self-care encouraged     Problem: Pain Acute  Goal: Optimal Pain Control and Function  Intervention: Optimize Psychosocial Wellbeing  Recent Flowsheet Documentation  Taken 07/16/2023 0817 by Titus Dubin, LPN  Diversional Activities: television     Problem: Fall Injury Risk  Goal: Absence of Fall and Fall-Related Injury  Intervention: Identify and Manage Contributors  Recent Flowsheet Documentation  Taken 07/16/2023 0817 by Titus Dubin, LPN  Self-Care Promotion:   BADL personal objects within reach   BADL personal routines maintained   meal set-up provided   adaptive equipment use encouraged  Intervention: Promote Injury-Free Environment  Recent Flowsheet Documentation  Taken 07/16/2023 1000 by Titus Dubin, LPN  Safety Promotion/Fall Prevention:   safety round/check completed   activity supervised  Taken 07/16/2023 0800 by Titus Dubin, LPN  Safety Promotion/Fall Prevention:   safety round/check completed   activity supervised     Problem: Adult Inpatient Plan of Care  Goal: Absence of Hospital-Acquired Illness or Injury  Intervention: Identify and Manage Fall Risk  Recent Flowsheet Documentation  Taken 07/16/2023 1000 by Titus Dubin, LPN  Safety Promotion/Fall Prevention:   safety round/check completed   activity supervised  Taken 07/16/2023 0800 by Titus Dubin, LPN  Safety Promotion/Fall Prevention:   safety round/check completed   activity supervised  Intervention: Prevent Skin Injury  Recent Flowsheet Documentation  Taken 07/16/2023 0817 by Titus Dubin, LPN  Skin Protection: adhesive use limited  Intervention: Prevent and Manage VTE (Venous Thromboembolism) Risk  Recent Flowsheet  Documentation  Taken 07/16/2023 0817 by Titus Dubin, LPN  VTE Prevention/Management:   ambulation promoted   dorsiflexion/plantar flexion performed  Goal: Optimal Comfort and Wellbeing  Intervention: Provide Person-Centered Care  Recent Flowsheet Documentation  Taken 07/16/2023 0817 by Titus Dubin, LPN  Trust Relationship/Rapport: care explained     Problem: Skin Injury Risk Increased  Goal: Skin Health and Integrity  Intervention: Optimize Skin Protection  Recent Flowsheet Documentation  Taken 07/16/2023 0817 by Titus Dubin, LPN  Pressure Reduction Techniques: Frequent weight shifting encouraged  Pressure Reduction Devices: Pressure redistributing mattress utilized  Skin Protection: adhesive use limited  Activity Management: up ad lib     Problem: Heart Failure  Goal: Optimal Coping  Intervention: Support Psychosocial Response  Recent Flowsheet Documentation  Taken 07/16/2023 0817 by Titus Dubin, LPN  Family/Support System Care: self-care encouraged  Goal: Optimal Functional Ability  Intervention: Optimize Functional Ability  Recent Flowsheet Documentation  Taken 07/16/2023 0817 by Titus Dubin, LPN  Self-Care Promotion:   BADL personal objects within reach   BADL personal routines maintained   meal set-up provided   adaptive equipment use encouraged  Activity Management: up ad lib  Goal: Effective Oxygenation and Ventilation  Intervention: Promote Airway Secretion Clearance  Recent Flowsheet Documentation  Taken 07/16/2023 0817 by Titus Dubin, LPN  Activity Management: up ad lib

## 2023-07-16 NOTE — Care Plan (Signed)
Patient is alert and oriented. IV patent. Room air. Home oxygen test completed. Ambulated around room and to restroom and did not drop below 96%. Standby assist. No complaints of pain. NSR on telemetry. Call bell within reach. Has had a BM during shift.   Maryclare Labrador, LPN

## 2023-07-16 NOTE — Progress Notes (Signed)
Internal Medicine Progress Note    Names:  [name]  Date of service: 07/16/2023  Date of Admission:  07/11/2023  Hospital Day:  LOS: 4 days     Assessment/ Plan:   Active Hospital Problems    Diagnosis    Primary Problem: Pleural effusion       1. Acute recurrent on chronic systolic congestive heart failure.  Borderline blood pressure makes difficult utilizing ACE inhibitor and Aldactone on a regular basis.  Continue to dose appropriately    2. Residual small Left pleural effusion with recent thoracentesis.  300 cc were removed several weeks ago.  Remaining effusion seems to be very small.  Discussed risk benefit ratio with patient and family.  Will monitor.    3. Severe protein calorie malnutrition.  Continue to provide supplements     4. Chronic constipation.  Multiple bowel movements after enema.  Continue to monitor    5. Acquired hypothyroidism.  Stable on present doses.      6. Hypotension.  Will use systolic blood pressure of 85 as a cut off point to give him medications or not.    7. Family aware of the grim prognosis.  Patient is now DNR      8. Acute hypoxic respiratory failure.  Oxygen challenge today.      PT/OT: Yes    Disposition Planning:   Home with home health possibly tomorrow    Subjective:  No chest pain or palpitations.  Improved shortness a breath at rest.  Denies dizziness nausea vomiting.  Passing flatus.  No abdominal pain.  Voiding without difficulty.  Had bowel movement yesterday      acetaminophen (TYLENOL) tablet, 650 mg, Oral, Q6H PRN  aluminum-magnesium hydroxide-simethicone (MAG-AL PLUS) 200-200-20 mg per 5 mL oral liquid, 30 mL, Oral, Q4H PRN  aspirin (ECOTRIN) enteric coated tablet 81 mg, 81 mg, Oral, Daily  atorvastatin (LIPITOR) tablet, 40 mg, Oral, QPM  carvedilol (COREG) tablet, 3.125 mg, Oral, 2x/day-Food  docusate sodium (COLACE) capsule, 100 mg, Oral, 2x/day PRN  enoxaparin PF (LOVENOX) 30 mg/0.3 mL SubQ injection, 30 mg, Subcutaneous, Q24H  furosemide (LASIX) 10  mg/mL injection, 40 mg, Intravenous, 2x/day  lactulose (ENULOSE) 20g per 30mL oral liquid, 30 mL, Oral, 3x/day  levothyroxine (SYNTHROID) tablet, 75 mcg, Oral, QAM  lisinopril (PRINIVIL) tablet, 5 mg, Oral, Daily  magnesium hydroxide (MILK OF MAGNESIA) 400mg  per 5mL oral liquid, 15 mL, Oral, 4x/day PRN  metoclopramide (REGLAN) tablet, 5 mg, Oral, 3x/day AC  NS flush syringe, 3 mL, Intracatheter, Q8HRS  NS flush syringe, 3 mL, Intracatheter, Q1H PRN  ondansetron (ZOFRAN) 2 mg/mL injection, 4 mg, Intravenous, Q6H PRN  pantoprazole (PROTONIX) delayed release tablet, 40 mg, Oral, Daily before Breakfast  sennosides-docusate sodium (SENOKOT-S) 8.6-50mg  per tablet, 1 Tablet, Oral, 2x/day  simethicone (MYLICON) chewable tablet, 160 mg, Oral, 4x/day AC        Physical Exam:    Vital Signs:  BP (!) 120/52   Pulse 72   Temp 36.6 C (97.8 F)   Resp 18   Ht 1.575 m (5\' 2" )   Wt (!) 33.1 kg (72 lb 15.6 oz)   LMP  (LMP Unknown)   SpO2 94%   BMI 13.35 kg/m         I/O:  I/O last 24 hours:    Intake/Output Summary (Last 24 hours) at 07/16/2023 1207  Last data filed at 07/16/2023 0800  Gross per 24 hour   Intake 240 ml   Output --  Net 240 ml     I/O current shift:  01/02 0700 - 01/02 1859  In: 240 [P.O.:240]  Out: -   Blood Sugars:     General:  Appears comfortable sitting up in a chair.  Visiting with family.  Cardio: Regular rate and rhythm. Normal S1 and S2. No murmurs, gallops, or rubs.   Resp:  Crackles in the bases bilaterally.  Perhaps decreased breath sounds bilaterally.  No bronchospasm  Abd: Bowel sounds present, Soft, non-tender  Extremities:  No calf pain but trace pitting bilateral lower leg edema      Labs:     Results for orders placed or performed during the hospital encounter of 07/11/23 (from the past 24 hour(s))   BASIC METABOLIC PANEL   Result Value Ref Range    SODIUM 133 (L) 136 - 145 mmol/L    POTASSIUM 3.5 3.5 - 5.1 mmol/L    CHLORIDE 94 (L) 96 - 111 mmol/L    CO2 TOTAL 30 23 - 31 mmol/L    ANION GAP 9  4 - 13 mmol/L    CALCIUM 8.3 (L) 8.6 - 10.3 mg/dL    GLUCOSE 696 65 - 295 mg/dL    BUN 14 8 - 25 mg/dL    CREATININE 2.84 1.32 - 1.05 mg/dL    BUN/CREA RATIO 16 6 - 22    ESTIMATED GFR - FEMALE 62 >=60 mL/min/BSA   CBC   Result Value Ref Range    WBC 5.7 3.7 - 11.0 x10^3/uL    RBC 3.66 (L) 3.85 - 5.22 x10^6/uL    HGB 11.7 11.5 - 16.0 g/dL    HCT 44.0 (L) 10.2 - 46.0 %    MCV 93.7 78.0 - 100.0 fL    MCH 32.0 26.0 - 32.0 pg    MCHC 34.1 31.0 - 35.5 g/dL    RDW-CV 72.5 36.6 - 44.0 %    PLATELETS 202 150 - 400 x10^3/uL    MPV 9.2 8.7 - 12.5 fL   MAGNESIUM   Result Value Ref Range    MAGNESIUM 2.3 1.8 - 2.6 mg/dL   PHOSPHORUS   Result Value Ref Range    PHOSPHORUS 3.3 2.3 - 4.0 mg/dL       Micro:   No results found for any visits on 07/11/23 (from the past 96 hour(s)).      Radiology:           Marylee Floras, MD    07/16/2023   12:12  [facility]  [facility]

## 2023-07-16 NOTE — Care Plan (Signed)
Problem: Health Knowledge, Opportunity to Enhance (Adult,Obstetrics,Pediatric)  Goal: Knowledgeable about Health Subject/Topic  Description: Patient will demonstrate the desired outcomes by discharge/transition of care.  Outcome: Ongoing (see interventions/notes)  Intervention: Enhance Health Knowledge  Recent Flowsheet Documentation  Taken 07/15/2023 2000 by Bethanne Ginger, GN  Supportive Measures: active listening utilized     Problem: ARDS (Acute Respiratory Distress Syndrome)  Goal: Effective Oxygenation  Outcome: Ongoing (see interventions/notes)     Problem: Pain Acute  Goal: Optimal Pain Control and Function  Outcome: Ongoing (see interventions/notes)  Intervention: Optimize Psychosocial Wellbeing  Recent Flowsheet Documentation  Taken 07/15/2023 2000 by Bethanne Ginger, GN  Spiritual Activities Assistance: personal rituals encouraged  Supportive Measures: active listening utilized  Intervention: Prevent or Manage Pain  Recent Flowsheet Documentation  Taken 07/15/2023 2000 by Bethanne Ginger, GN  Bowel Elimination Promotion: adequate fluid intake promoted     Problem: Fall Injury Risk  Goal: Absence of Fall and Fall-Related Injury  Outcome: Ongoing (see interventions/notes)  Intervention: Promote Scientist, clinical (histocompatibility and immunogenetics) Documentation  Taken 07/16/2023 0400 by Clinton Gallant B, GN  Safety Promotion/Fall Prevention: safety round/check completed  Taken 07/16/2023 0000 by Bethanne Ginger, GN  Safety Promotion/Fall Prevention: safety round/check completed  Taken 07/15/2023 2000 by Bethanne Ginger, GN  Safety Promotion/Fall Prevention: safety round/check completed     Problem: Adult Inpatient Plan of Care  Goal: Plan of Care Review  Outcome: Ongoing (see interventions/notes)  Goal: Patient-Specific Goal (Individualized)  Outcome: Ongoing (see interventions/notes)  Goal: Absence of Hospital-Acquired Illness or Injury  Outcome: Ongoing (see interventions/notes)  Intervention: Identify and Manage Fall Risk  Recent Flowsheet Documentation  Taken 07/16/2023  0400 by Clinton Gallant B, GN  Safety Promotion/Fall Prevention: safety round/check completed  Taken 07/16/2023 0000 by Bethanne Ginger, GN  Safety Promotion/Fall Prevention: safety round/check completed  Taken 07/15/2023 2000 by Bethanne Ginger, GN  Safety Promotion/Fall Prevention: safety round/check completed  Intervention: Prevent Skin Injury  Recent Flowsheet Documentation  Taken 07/15/2023 2000 by Bethanne Ginger, GN  Skin Protection: adhesive use limited  Intervention: Prevent and Manage VTE (Venous Thromboembolism) Risk  Recent Flowsheet Documentation  Taken 07/15/2023 2000 by Bethanne Ginger, GN  VTE Prevention/Management: ambulation promoted  Goal: Optimal Comfort and Wellbeing  Outcome: Ongoing (see interventions/notes)  Intervention: Provide Person-Centered Care  Recent Flowsheet Documentation  Taken 07/15/2023 2000 by Bethanne Ginger, GN  Trust Relationship/Rapport:   care explained   empathic listening provided   thoughts/feelings acknowledged  Goal: Rounds/Family Conference  Outcome: Ongoing (see interventions/notes)     Problem: Skin Injury Risk Increased  Goal: Skin Health and Integrity  Outcome: Ongoing (see interventions/notes)  Intervention: Optimize Skin Protection  Recent Flowsheet Documentation  Taken 07/15/2023 2000 by Clinton Gallant B, GN  Pressure Reduction Techniques: Frequent weight shifting encouraged  Pressure Reduction Devices: Pressure reduction chair cushion utilized  Skin Protection: adhesive use limited  Activity Management: up ad lib     Problem: Heart Failure  Goal: Optimal Coping  Outcome: Ongoing (see interventions/notes)  Intervention: Support Psychosocial Response  Recent Flowsheet Documentation  Taken 07/15/2023 2000 by Bethanne Ginger, GN  Supportive Measures: active listening utilized  Goal: Optimal Cardiac Output and Blood Flow  Outcome: Ongoing (see interventions/notes)  Intervention: Optimize Cardiac Output  Recent Flowsheet Documentation  Taken 07/15/2023 2000 by Bethanne Ginger, GN  Environmental Support: calm environment promoted  Goal: Stable Heart Rate  and Rhythm  Outcome: Ongoing (see interventions/notes)  Goal: Fluid and Electrolyte Balance  Outcome: Ongoing (see interventions/notes)  Goal: Optimal Functional Ability  Outcome: Ongoing (see interventions/notes)  Intervention: Optimize Functional Ability  Recent Flowsheet Documentation  Taken 07/15/2023 2000 by Bethanne Ginger, GN  Activity Management: up ad lib  Goal: Improved Oral Intake  Outcome: Ongoing (see interventions/notes)  Goal: Effective Oxygenation and Ventilation  Outcome: Ongoing (see interventions/notes)  Intervention: Promote Airway Secretion Clearance  Recent Flowsheet Documentation  Taken 07/15/2023 2000 by Bethanne Ginger, GN  Activity Management: up ad lib  Goal: Effective Breathing Pattern During Sleep  Outcome: Ongoing (see interventions/notes)     Problem: Gas Exchange Impaired  Goal: Optimal Gas Exchange  Outcome: Ongoing (see interventions/notes)

## 2023-07-16 NOTE — Nurses Notes (Signed)
Patient is alert and oriented times four. IV remains clean, dry, and intact. Vitals are stable. Dressings are clean, dry, and intact. Free from falls. Pt running NSR on telemetry.    Alphonzo Lemmings, GN

## 2023-07-16 NOTE — Progress Notes (Signed)
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 stable continue intravenous Lasix monitor closely  Small pleural effusion status post thoracentesis  Severe protein and calorie malnutrition the   Chronic constipation  Hypothyroid on Synthroid  Hypotension  Patient is DNR   Failure to thrive   At this time patient not having any chest  pain continue medical treatment     Discussed with the family at this time since she is not having chest pain and the will be monitored on current treatment  Active Hospital Problems    Diagnosis    Primary Problem: Pleural effusion             Current Medications:  acetaminophen (TYLENOL) tablet, 650 mg, Oral, Q6H PRN  aluminum-magnesium hydroxide-simethicone (MAG-AL PLUS) 200-200-20 mg per 5 mL oral liquid, 30 mL, Oral, Q4H PRN  aspirin (ECOTRIN) enteric coated tablet 81 mg, 81 mg, Oral, Daily  atorvastatin (LIPITOR) tablet, 40 mg, Oral, QPM  carvedilol (COREG) tablet, 3.125 mg, Oral, 2x/day-Food  docusate sodium (COLACE) capsule, 100 mg, Oral, 2x/day PRN  enoxaparin PF (LOVENOX) 30 mg/0.3 mL SubQ injection, 30 mg, Subcutaneous, Q24H  furosemide (LASIX) 10 mg/mL injection, 40 mg, Intravenous, 2x/day  lactulose (ENULOSE) 20g per 30mL oral liquid, 30 mL, Oral, 3x/day  levothyroxine (SYNTHROID) tablet, 75 mcg, Oral, QAM  lisinopril (PRINIVIL) tablet, 5 mg, Oral, Daily  magnesium hydroxide (MILK OF MAGNESIA) 400mg  per 5mL oral liquid, 15 mL, Oral, 4x/day PRN  metoclopramide (REGLAN) tablet, 5 mg, Oral, 3x/day AC  NS flush syringe, 3 mL, Intracatheter, Q8HRS  NS flush syringe, 3 mL, Intracatheter, Q1H PRN  ondansetron (ZOFRAN) 2 mg/mL injection, 4 mg, Intravenous, Q6H PRN  pantoprazole (PROTONIX) delayed release tablet, 40 mg, Oral, Daily before  Breakfast  sennosides-docusate sodium (SENOKOT-S) 8.6-50mg  per tablet, 1 Tablet, Oral, 2x/day  simethicone (MYLICON) chewable tablet, 160 mg, Oral, 4x/day AC      Allergies   Allergen Reactions    Lactose Rash    Grass Pollen              Review system  Otherwise negative    Vital Signs:  All vital signs reviewed    Physical exam  Patient alert oriented x3 not in acute distress new appeared to be chronically ill  HEENT unremarkable  Neck no JVD or bruit carotid upstroke within normal limit neck is supple  Lungs clear no rales rhonchi or wheezing  Heart Regular rhythm no gallop, rub or murmur     Abdomen soft nontender no masses or organomegaly, bowel sounds positive, no masses.  Extremities no cyanosis, clubbing or edema  Skin no rash.  Neuro grossly within normal limit no focal finding      Temp (24hrs) Max:36.8 C (98.2 F)    Temperature: 36.3 C (97.3 F) (07/16/23 1458)  BP (Non-Invasive): (!) 103/57 (07/16/23 1458)  MAP (Non-Invasive): 67 mmHG (07/16/23 1458)  Heart Rate: 78 (07/16/23 1458)  Respiratory Rate: 18 (07/16/23 1458)  SpO2: 98 % (07/16/23 1458)      I/O last 24 hours:      Intake/Output Summary (Last 24 hours) at 07/16/2023 1803  Last data filed at 07/16/2023 1200  Gross per 24 hour  Intake 480 ml   Output --   Net 480 ml     I/O current shift:    01/02 0700 - 01/02 1859  In: 480 [P.O.:480]  Out: -   No results for input(s): "GLUIP" in the last 48 hours.            Labs:  All Lab Results reviewed by myself   for Last 24 Hours:    Results for orders placed or performed during the hospital encounter of 07/11/23 (from the past 24 hour(s))   BASIC METABOLIC PANEL   Result Value Ref Range    SODIUM 133 (L) 136 - 145 mmol/L    POTASSIUM 3.5 3.5 - 5.1 mmol/L    CHLORIDE 94 (L) 96 - 111 mmol/L    CO2 TOTAL 30 23 - 31 mmol/L    ANION GAP 9 4 - 13 mmol/L    CALCIUM 8.3 (L) 8.6 - 10.3 mg/dL    GLUCOSE 244 65 - 010 mg/dL    BUN 14 8 - 25 mg/dL    CREATININE 2.72 5.36 - 1.05 mg/dL    BUN/CREA RATIO 16 6 - 22     ESTIMATED GFR - FEMALE 62 >=60 mL/min/BSA   CBC   Result Value Ref Range    WBC 5.7 3.7 - 11.0 x10^3/uL    RBC 3.66 (L) 3.85 - 5.22 x10^6/uL    HGB 11.7 11.5 - 16.0 g/dL    HCT 64.4 (L) 03.4 - 46.0 %    MCV 93.7 78.0 - 100.0 fL    MCH 32.0 26.0 - 32.0 pg    MCHC 34.1 31.0 - 35.5 g/dL    RDW-CV 74.2 59.5 - 63.8 %    PLATELETS 202 150 - 400 x10^3/uL    MPV 9.2 8.7 - 12.5 fL   MAGNESIUM   Result Value Ref Range    MAGNESIUM 2.3 1.8 - 2.6 mg/dL   PHOSPHORUS   Result Value Ref Range    PHOSPHORUS 3.3 2.3 - 4.0 mg/dL         Rosie Fate, MD      This note may have been partially generated using MModal Fluency Direct system, and there may be some incorrect words, spellings, and punctuation that were not noted in checking the note before saving.

## 2023-07-17 ENCOUNTER — Non-Acute Institutional Stay: Payer: Medicare Hospice

## 2023-07-17 ENCOUNTER — Other Ambulatory Visit: Payer: Self-pay

## 2023-07-17 DIAGNOSIS — J90 Pleural effusion, not elsewhere classified: Secondary | ICD-10-CM

## 2023-07-17 DIAGNOSIS — I255 Ischemic cardiomyopathy: Secondary | ICD-10-CM

## 2023-07-17 LAB — CBC
HCT: 34.7 % — ABNORMAL LOW (ref 34.8–46.0)
HGB: 11.8 g/dL (ref 11.5–16.0)
MCH: 32.7 pg — ABNORMAL HIGH (ref 26.0–32.0)
MCHC: 34 g/dL (ref 31.0–35.5)
MCV: 96.1 fL (ref 78.0–100.0)
MPV: 9.8 fL (ref 8.7–12.5)
PLATELETS: 227 10*3/uL (ref 150–400)
RBC: 3.61 10*6/uL — ABNORMAL LOW (ref 3.85–5.22)
RDW-CV: 13 % (ref 11.5–15.5)
WBC: 5.2 10*3/uL (ref 3.7–11.0)

## 2023-07-17 LAB — BASIC METABOLIC PANEL
ANION GAP: 10 mmol/L (ref 4–13)
BUN/CREA RATIO: 17 (ref 6–22)
BUN: 14 mg/dL (ref 8–25)
CALCIUM: 8.5 mg/dL — ABNORMAL LOW (ref 8.6–10.3)
CHLORIDE: 92 mmol/L — ABNORMAL LOW (ref 96–111)
CO2 TOTAL: 30 mmol/L (ref 23–31)
CREATININE: 0.82 mg/dL (ref 0.60–1.05)
ESTIMATED GFR - FEMALE: 70 mL/min/BSA (ref >=60)
GLUCOSE: 113 mg/dL (ref 65–125)
POTASSIUM: 3.3 mmol/L — ABNORMAL LOW (ref 3.5–5.1)
SODIUM: 132 mmol/L — ABNORMAL LOW (ref 136–145)

## 2023-07-17 LAB — MAGNESIUM: MAGNESIUM: 2.3 mg/dL (ref 1.8–2.6)

## 2023-07-17 LAB — PHOSPHORUS: PHOSPHORUS: 3 mg/dL (ref 2.3–4.0)

## 2023-07-17 MED ORDER — ASPIRIN 81 MG TABLET,DELAYED RELEASE
81.0000 mg | DELAYED_RELEASE_TABLET | Freq: Every day | ORAL | 11 refills | Status: AC
Start: 2023-07-18 — End: 2024-07-12
  Filled 2023-07-17: qty 30, 30d supply, fill #0

## 2023-07-17 MED ORDER — ATORVASTATIN 40 MG TABLET
40.0000 mg | ORAL_TABLET | Freq: Every evening | ORAL | 11 refills | Status: AC
Start: 2023-07-17 — End: 2024-07-11
  Filled 2023-07-17: qty 30, 30d supply, fill #0

## 2023-07-17 MED ORDER — FUROSEMIDE 40 MG TABLET
40.0000 mg | ORAL_TABLET | Freq: Two times a day (BID) | ORAL | 11 refills | Status: DC
Start: 2023-07-17 — End: 2023-09-24
  Filled 2023-07-17: qty 60, 30d supply, fill #0

## 2023-07-17 NOTE — Care Plan (Signed)
Problem: Health Knowledge, Opportunity to Enhance (Adult,Obstetrics,Pediatric)  Goal: Knowledgeable about Health Subject/Topic  Description: Patient will demonstrate the desired outcomes by discharge/transition of care.  Intervention: Enhance Health Knowledge  Recent Flowsheet Documentation  Taken 07/17/2023 0753 by Titus Dubin, LPN  Family/Support System Care: self-care encouraged     Problem: ARDS (Acute Respiratory Distress Syndrome)  Goal: Effective Oxygenation  Intervention: Optimize Oxygenation, Ventilation and Perfusion  Recent Flowsheet Documentation  Taken 07/17/2023 0753 by Titus Dubin, LPN  Head of Bed (HOB) Positioning: HOB elevated     Problem: Pain Acute  Goal: Optimal Pain Control and Function  Intervention: Optimize Psychosocial Wellbeing  Recent Flowsheet Documentation  Taken 07/17/2023 0753 by Titus Dubin, LPN  Diversional Activities: television     Problem: Fall Injury Risk  Goal: Absence of Fall and Fall-Related Injury  Intervention: Identify and Manage Contributors  Recent Flowsheet Documentation  Taken 07/17/2023 0753 by Titus Dubin, LPN  Self-Care Promotion:   BADL personal objects within reach   BADL personal routines maintained  Intervention: Promote Injury-Free Environment  Recent Flowsheet Documentation  Taken 07/17/2023 1000 by Titus Dubin, LPN  Safety Promotion/Fall Prevention:   safety round/check completed   activity supervised  Taken 07/17/2023 0753 by Titus Dubin, LPN  Safety Promotion/Fall Prevention:   safety round/check completed   activity supervised     Problem: Adult Inpatient Plan of Care  Goal: Absence of Hospital-Acquired Illness or Injury  Intervention: Identify and Manage Fall Risk  Recent Flowsheet Documentation  Taken 07/17/2023 1000 by Titus Dubin, LPN  Safety Promotion/Fall Prevention:   safety round/check completed   activity supervised  Taken 07/17/2023 0753 by Titus Dubin, LPN  Safety Promotion/Fall Prevention:   safety round/check completed   activity supervised  Intervention: Prevent Skin  Injury  Recent Flowsheet Documentation  Taken 07/17/2023 0753 by Titus Dubin, LPN  Body Position: sitting  Skin Protection: adhesive use limited  Intervention: Prevent and Manage VTE (Venous Thromboembolism) Risk  Recent Flowsheet Documentation  Taken 07/17/2023 0753 by Titus Dubin, LPN  VTE Prevention/Management:   ambulation promoted   dorsiflexion/plantar flexion performed  Goal: Optimal Comfort and Wellbeing  Intervention: Provide Person-Centered Care  Recent Flowsheet Documentation  Taken 07/17/2023 0753 by Titus Dubin, LPN  Trust Relationship/Rapport: care explained     Problem: Skin Injury Risk Increased  Goal: Skin Health and Integrity  Intervention: Optimize Skin Protection  Recent Flowsheet Documentation  Taken 07/17/2023 0753 by Titus Dubin, LPN  Pressure Reduction Techniques: Frequent weight shifting encouraged  Pressure Reduction Devices: Repositioning wedges/pillows utilized  Skin Protection: adhesive use limited  Activity Management: up ad lib  Head of Bed (HOB) Positioning: HOB elevated     Problem: Heart Failure  Goal: Optimal Coping  Intervention: Support Psychosocial Response  Recent Flowsheet Documentation  Taken 07/17/2023 0753 by Titus Dubin, LPN  Family/Support System Care: self-care encouraged  Goal: Optimal Functional Ability  Intervention: Optimize Functional Ability  Recent Flowsheet Documentation  Taken 07/17/2023 0753 by Titus Dubin, LPN  Self-Care Promotion:   BADL personal objects within reach   BADL personal routines maintained  Activity Management: up ad lib  Goal: Effective Oxygenation and Ventilation  Intervention: Promote Airway Secretion Clearance  Recent Flowsheet Documentation  Taken 07/17/2023 0753 by Titus Dubin, LPN  Activity Management: up ad lib  Intervention: Optimize Oxygenation and Ventilation  Recent Flowsheet Documentation  Taken 07/17/2023 0753 by Titus Dubin, LPN  Head of Bed (HOB) Positioning: HOB elevated     Problem: Gas Exchange Impaired  Goal: Optimal Gas Exchange  Intervention: Optimize  Oxygenation and Ventilation  Recent Flowsheet Documentation  Taken 07/17/2023 0753 by Titus Dubin, LPN  Head of Bed [facility]) Positioning: HOB elevated

## 2023-07-17 NOTE — Care Plan (Signed)
Problem: Health Knowledge, Opportunity to Enhance (Adult,Obstetrics,Pediatric)  Goal: Knowledgeable about Health Subject/Topic  Description: Patient will demonstrate the desired outcomes by discharge/transition of care.  Outcome: Ongoing (see interventions/notes)  Intervention: Enhance Health Knowledge  Recent Flowsheet Documentation  Taken 07/16/2023 2000 by Bethanne Ginger, GN  Supportive Measures: active listening utilized     Problem: ARDS (Acute Respiratory Distress Syndrome)  Goal: Effective Oxygenation  Outcome: Ongoing (see interventions/notes)     Problem: Pain Acute  Goal: Optimal Pain Control and Function  Outcome: Ongoing (see interventions/notes)  Intervention: Optimize Psychosocial Wellbeing  Recent Flowsheet Documentation  Taken 07/16/2023 2000 by Bethanne Ginger, GN  Spiritual Activities Assistance: personal rituals encouraged  Supportive Measures: active listening utilized  Intervention: Prevent or Manage Pain  Recent Flowsheet Documentation  Taken 07/16/2023 2000 by Bethanne Ginger, GN  Bowel Elimination Promotion: adequate fluid intake promoted     Problem: Fall Injury Risk  Goal: Absence of Fall and Fall-Related Injury  Outcome: Ongoing (see interventions/notes)  Intervention: Promote Scientist, clinical (histocompatibility and immunogenetics) Documentation  Taken 07/16/2023 2000 by Bethanne Ginger, GN  Safety Promotion/Fall Prevention: safety round/check completed     Problem: Adult Inpatient Plan of Care  Goal: Plan of Care Review  Outcome: Ongoing (see interventions/notes)  Goal: Patient-Specific Goal (Individualized)  Outcome: Ongoing (see interventions/notes)  Goal: Absence of Hospital-Acquired Illness or Injury  Outcome: Ongoing (see interventions/notes)  Intervention: Identify and Manage Fall Risk  Recent Flowsheet Documentation  Taken 07/16/2023 2000 by Bethanne Ginger, GN  Safety Promotion/Fall Prevention: safety round/check completed  Intervention: Prevent Skin Injury  Recent Flowsheet Documentation  Taken 07/16/2023 2000 by Bethanne Ginger, GN  Skin  Protection: adhesive use limited  Intervention: Prevent and Manage VTE (Venous Thromboembolism) Risk  Recent Flowsheet Documentation  Taken 07/16/2023 2000 by Clinton Gallant B, GN  VTE Prevention/Management: ambulation promoted  Goal: Optimal Comfort and Wellbeing  Outcome: Ongoing (see interventions/notes)  Intervention: Provide Person-Centered Care  Recent Flowsheet Documentation  Taken 07/16/2023 2000 by Bethanne Ginger, GN  Trust Relationship/Rapport:   care explained   empathic listening provided   thoughts/feelings acknowledged  Goal: Rounds/Family Conference  Outcome: Ongoing (see interventions/notes)     Problem: Skin Injury Risk Increased  Goal: Skin Health and Integrity  Outcome: Ongoing (see interventions/notes)  Intervention: Optimize Skin Protection  Recent Flowsheet Documentation  Taken 07/16/2023 2000 by Clinton Gallant B, GN  Pressure Reduction Techniques: Frequent weight shifting encouraged  Pressure Reduction Devices: Repositioning wedges/pillows utilized  Skin Protection: adhesive use limited  Activity Management: up ad lib     Problem: Heart Failure  Goal: Optimal Coping  Outcome: Ongoing (see interventions/notes)  Intervention: Support Psychosocial Response  Recent Flowsheet Documentation  Taken 07/16/2023 2000 by Bethanne Ginger, GN  Supportive Measures: active listening utilized  Goal: Optimal Cardiac Output and Blood Flow  Outcome: Ongoing (see interventions/notes)  Intervention: Optimize Cardiac Output  Recent Flowsheet Documentation  Taken 07/16/2023 2000 by Bethanne Ginger, GN  Environmental Support: calm environment promoted  Goal: Stable Heart Rate and Rhythm  Outcome: Ongoing (see interventions/notes)  Goal: Fluid and Electrolyte Balance  Outcome: Ongoing (see interventions/notes)  Goal: Optimal Functional Ability  Outcome: Ongoing (see interventions/notes)  Intervention: Optimize Functional Ability  Recent Flowsheet Documentation  Taken 07/16/2023 2000 by Bethanne Ginger, GN  Activity Management: up ad lib  Goal: Improved Oral Intake  Outcome:  Ongoing (see interventions/notes)  Goal: Effective Oxygenation and Ventilation  Outcome: Ongoing (see interventions/notes)  Intervention: Promote Airway Secretion Clearance  Recent Flowsheet Documentation  Taken 07/16/2023 2000 by Bethanne Ginger, GN  Activity Management: up ad lib  Goal: Effective Breathing Pattern During Sleep  Outcome: Ongoing (see interventions/notes)     Problem: Gas Exchange Impaired  Goal: Optimal Gas Exchange  Outcome: Ongoing (see interventions/notes)

## 2023-07-17 NOTE — Nurses Notes (Signed)
Pt discharged to home with [facility]. Patient given discharge instructions, information on new medications and informed of follow up appointments. Pt denies any pain at this time. Patient to leave floor via wheelchair. Kamauri Denardo, RN

## 2023-07-17 NOTE — Nursing Note (Signed)
Case conference scheduled

## 2023-07-17 NOTE — Care Management Notes (Signed)
Referral Information  ++++++ Placed Provider #1 ++++++  Case Manager: Shannon McDaniel  Provider Type: Home Health - Return  Provider Name: [facility]- [place],  [place], [place], [place], [place], [place], [place]  Address:  [address]  [place], [place] [address]  Contact: stephenie bartlett    Phone: [phone] x  Fax:   Fax: [phone]

## 2023-07-17 NOTE — Discharge Summary (Signed)
DISCHARGE SUMMARY      Date of Service:  07/17/2023  [name], [name], 89 y.o. female  Date of Birth:  1935/06/21  PCP: Marylee Floras, MD    ADMISSION DATE:  07/11/2023  DISCHARGE DATE:  07/17/2023    ATTENDING PHYSICIAN: Marylee Floras, MD  PRIMARY CARE PHYSICIAN: Marylee Floras, MD     PRIMARY ADMISSION DIAGNOSIS: Pleural effusion    DISCHARGE DIAGNOSES:     1. Acute recurrent on chronic systolic congestive heart failure     2. Dilated ischemic cardiomyopathy     3. Residual small left pleural effusion with recent thoracentesis.  Transudate.    4. Severe protein calorie malnutrition    5. Chronic constipation     6. Acquired hypothyroidism     7. Hypotension.    8. Acute hypoxic respiratory failure.  Resolved.  Patient did not qualify for home oxygen.    Active Hospital Problems    Diagnosis Date Noted    Principal Problem: Pleural effusion [J90] 07/12/2023      Resolved Hospital Problems   No resolved problems to display.     Active Non-Hospital Problems    Diagnosis Date Noted    CHF (congestive heart failure) (CMS HCC) 06/29/2023    Constipation 04/01/2023    Fecal impaction of colon (CMS HCC) 02/05/2023    Protein-calorie malnutrition, unspecified severity (CMS HCC) 09/08/2022    Vitamin D deficiency 12/23/2016    High risk medication use 12/23/2016    HLD (hyperlipidemia) 12/03/2016    Hypothyroidism 06/30/2006        DISCHARGE MEDICATIONS:     Current Discharge Medication List        START taking these medications.        Details   aspirin 81 mg Tablet, Delayed Release (E.C.)  Commonly known as: ECOTRIN  Start taking on: July 18, 2023   81 mg, Oral, DAILY  Qty: 30 Tablet  Refills: 11     atorvastatin 40 mg Tablet  Commonly known as: LIPITOR   40 mg, Oral, EVERY EVENING  Qty: 30 Tablet  Refills: 11            CONTINUE these medications which have CHANGED during your visit.        Details   furosemide 40 mg Tablet  Commonly known as: LASIX  What changed:   medication strength  how much to take  when  to take this   40 mg, Oral, 2 TIMES DAILY  Qty: 60 Tablet  Refills: 11            CONTINUE these medications - NO CHANGES were made during your visit.        Details   carvediloL 3.125 mg Tablet  Commonly known as: COREG   3.125 mg, Oral, 2 TIMES DAILY WITH FOOD  Qty: 60 Tablet  Refills: 11     lactulose 20 gram/30 mL Solution  Commonly known as: ENULOSE   30 mL, Oral, 3 TIMES DAILY  Qty: 8100 mL  Refills: 3     levothyroxine 75 mcg Tablet  Commonly known as: SYNTHROID   75 mcg, Oral, EVERY MORNING  Qty: 90 Tablet  Refills: 3     lidocaine 4 % Adhesive Patch, Medicated   Place 1 Patch on the skin Once a day  Qty: 30 Patch  Refills: 11     lisinopriL 2.5 mg Tablet  Commonly known as: PRINIVIL   2.5 mg, Oral, DAILY  Qty: 30 Tablet  Refills: 11     metoclopramide HCl 5 mg Tablet  Commonly known as: REGLAN   Take 1 Tablet (5 mg total) by mouth Three times daily before meals  Qty: 90 Tablet  Refills: 11     sennosides-docusate sodium 8.6-50 mg Tablet  Commonly known as: SENOKOT-S   1 Tablet, Oral, 2 TIMES DAILY  Qty: 60 Tablet  Refills: 11     simethicone 80 mg Tablet, Chewable  Commonly known as: MYLICON   Chew and swallow 2 Tablets (160 mg total) by mouth Four times a day  Qty: 240 Tablet  Refills: 11              DISCHARGE INSTRUCTIONS:      Refer to Home Health - [facility] [place]   Referral Type: Home Health   Requested Specialty: HOME HEALTH AGENCY   Number of Visits Requested: 999       REASON FOR HOSPITALIZATION AND HOSPITAL COURSE:  This is a 89 y.o. female shortness of breath.  Hypoxia.  Congestive heart failure.  Dilated ischemic cardiomyopathy.  Ejection fraction 20%.  Treated symptomatically.  Evaluated by Cardiology.  Aspirin and Lipitor added to the medical regiment.  Diuresis with somewhat limited by patient's borderline low blood pressure.  The patient was asymptomatic.  Breathing improved.  Lungs were clear without crackles at the time of discharge.  Adjustments were made in  the patient's diuretic.      Patient has a small residual left pleural effusion.  Thoracentesis several weeks ago demonstrated a transudate.  Effusion was not very large and risk of re tapping was greater than the benefit.      Patient with severe protein calorie malnutrition.  Received counseling during this hospitalization.      Acute hypoxic respiratory failure at admission.  Oxygen challenge unremarkable at the time of discharge.  Did not qualify for home oxygen.    PHYSICAL EXAM AT DISCHARGE:   Temperature: 36.9 C (98.4 F)  Heart Rate: 87  BP (Non-Invasive): (!) 97/52  Respiratory Rate: 16  SpO2: 98 %  General: no distress  Eyes: Conjunctiva clear., Sclera non-icteric.   Lungs: Breathing nonlabored, Clear to auscultation bilaterally. , Clear to auscultation and percussion bilaterally.   Cardiovascular: regular rate and rhythm, S1, S2 normal, no murmur, click, rub or gallop  Abdomen: non-distended, Soft, non-tender, Bowel sounds normal  Extremities: No edema  Skin: Skin warm and dry      SIGNIFICANT LAB: Lab Results for Last 24 Hours:    Results for orders placed or performed during the hospital encounter of 07/11/23 (from the past 24 hour(s))   CBC   Result Value Ref Range    WBC 5.2 3.7 - 11.0 x10^3/uL    RBC 3.61 (L) 3.85 - 5.22 x10^6/uL    HGB 11.8 11.5 - 16.0 g/dL    HCT 16.1 (L) 09.6 - 46.0 %    MCV 96.1 78.0 - 100.0 fL    MCH 32.7 (H) 26.0 - 32.0 pg    MCHC 34.0 31.0 - 35.5 g/dL    RDW-CV 04.5 40.9 - 81.1 %    PLATELETS 227 150 - 400 x10^3/uL    MPV 9.8 8.7 - 12.5 fL   BASIC METABOLIC PANEL   Result Value Ref Range    SODIUM 132 (L) 136 - 145 mmol/L    POTASSIUM 3.3 (L) 3.5 - 5.1 mmol/L    CHLORIDE 92 (L) 96 - 111 mmol/L    CO2  TOTAL 30 23 - 31 mmol/L    ANION GAP 10 4 - 13 mmol/L    CALCIUM 8.5 (L) 8.6 - 10.3 mg/dL    GLUCOSE 782 65 - 956 mg/dL    BUN 14 8 - 25 mg/dL    CREATININE 2.13 0.86 - 1.05 mg/dL    BUN/CREA RATIO 17 6 - 22    ESTIMATED GFR - FEMALE 70 >=60 mL/min/BSA   PHOSPHORUS   Result Value  Ref Range    PHOSPHORUS 3.0 2.3 - 4.0 mg/dL   MAGNESIUM   Result Value Ref Range    MAGNESIUM 2.3 1.8 - 2.6 mg/dL       SIGNIFICANT RADIOLOGY:  Reviewed    CONSULTATIONS:  Cardiology    PROCEDURES PERFORMED:  Transthoracic echo echocardiogram    CONDITION ON DISCHARGE: Alert, Oriented, and VS Stable    DISCHARGE DISPOSITION:  Home discharge     ISSUES FOR OUTPATIENT F/U:   None    I spent more than 30 minutes discharging this patient, including review of current and discharge medications, final assessment, diagnosis and treatment of the patient; discussion of hospital course and discharge instructions with the patient and/or family; and arrangements for follow-up care, including appointments, needed prescriptions, referrals, and/or preparation of discharge records.      cc: Primary Care Physician:  Marylee Floras, MD  [address]  [facility] [id number]     BM:[id number] Physician:  No referring provider defined for this encounter.     Marylee Floras, MD1/3/202512:00

## 2023-07-20 ENCOUNTER — Telehealth (HOSPITAL_COMMUNITY): Payer: Self-pay | Admitting: Internal Medicine

## 2023-07-20 ENCOUNTER — Ambulatory Visit (HOSPITAL_BASED_OUTPATIENT_CLINIC_OR_DEPARTMENT_OTHER): Payer: Medicare Other | Admitting: Student in an Organized Health Care Education/Training Program

## 2023-07-20 NOTE — Progress Notes (Signed)
Transition of Care Contact Information  Discharge Date: 07/17/2023  Transition Facility Type--Hospital (Inpatient or Observation)  Facility [facility]  Interactive Contact(s): Completed or attempted contact indicated by Date/Time  First Attempt Call: 07/20/2023  9:37 AM  Second Attempted Contact: 07/20/2023  1:36 PM  Contact Method(s)-- Patient/Caregiver Telephone  Clinical Staff Name/Role who contacted--Deryn Massengale, RN  Transition Note:Attempted contact x2

## 2023-07-21 ENCOUNTER — Other Ambulatory Visit: Payer: Self-pay

## 2023-07-22 ENCOUNTER — Other Ambulatory Visit: Payer: Self-pay

## 2023-07-22 ENCOUNTER — Ambulatory Visit: Payer: Medicare Hospice | Admitting: Rehabilitative and Restorative Service Providers"

## 2023-07-23 ENCOUNTER — Ambulatory Visit: Payer: Self-pay | Admitting: Rehabilitative and Restorative Service Providers"

## 2023-07-24 NOTE — Progress Notes (Signed)
[facility]  Cardiology   Progress Note      [name], [name]  Date of Admission:  07/11/2023  Date of service:  07/17/2023  Date of Birth:  October 16, 1934  MRN:  [id number]    Hospital Day:  LOS: 5 days     Assessment/ Plan:  Acute on chronic systolic congestive heart failure continue Lasix symptoms improved continue Lasix monitor current treatment stable cardiac-wise for discharge  Small left pleural effusion status post thoracentesis  Severe protein and calorie malnutrition encouraging to take Ensure still  Chronic constipation improved  Hypothyroid on since  Hypotension stable  Patient is DNR right now  Failure to thrive continue encouraged to eat  Active Hospital Problems    Diagnosis    Primary Problem: Pleural effusion     Subjective   The patient this time 0 showing medical treatment family aware of her condition continue current treatment to follow up with me in 2 weeks  Allergies   Allergen Reactions    Lactose Rash    Grass Pollen              Review system  Otherwise negative    Vital Signs:  All vital signs reviewed    Physical exam  Patient alert oriented x3 not in acute distress  HEENT unremarkable  Neck no JVD or bruit carotid upstroke within normal limit neck is supple  Lungs clear no rales rhonchi or wheezing  Heart Regular rhythm no gallop, rub or murmur     Abdomen soft nontender no masses or organomegaly, bowel sounds positive, no masses.  Extremities no cyanosis, clubbing or edema  Skin no rash.  Neuro grossly within normal limit no focal finding      No data recorded.      Temperature: 36.9 C (98.4 F) (07/17/23 0753)  BP (Non-Invasive): (!) 97/52 (07/17/23 0753)  MAP (Non-Invasive): (!) 61 mmHG (07/17/23 0753)  Heart Rate: 87 (07/17/23 0753)  Respiratory Rate: 16 (07/17/23 0753)  SpO2: 98 % (07/17/23 0753)      I/O last 24 hours:  No intake or output data in the 24 hours ending 07/24/23 0133  I/O current shift:  No intake/output data recorded.  No results for input(s): "GLUIP" in the  last 48 hours.            Labs:  All Lab Results reviewed by myself   for Last 24 Hours:  No results found for any visits on 07/11/23 (from the past 24 hour(s)).      Rosie Fate, MD      This note may have been partially generated using MModal Fluency Direct system, and there may be some incorrect words, spellings, and punctuation that were not noted in checking the note before saving.

## 2023-08-07 ENCOUNTER — Inpatient Hospital Stay
Admission: EM | Admit: 2023-08-07 | Discharge: 2023-08-13 | DRG: 177 | Disposition: A | Discharge status: Home / Self Care | Payer: Medicare Other | Attending: Internal Medicine | Admitting: Internal Medicine

## 2023-08-07 ENCOUNTER — Emergency Department (HOSPITAL_COMMUNITY): Payer: Medicare Other

## 2023-08-07 ENCOUNTER — Encounter (HOSPITAL_COMMUNITY): Payer: Self-pay

## 2023-08-07 ENCOUNTER — Other Ambulatory Visit: Payer: Self-pay

## 2023-08-07 ENCOUNTER — Inpatient Hospital Stay (HOSPITAL_COMMUNITY): Payer: Medicare Other | Admitting: Internal Medicine

## 2023-08-07 DIAGNOSIS — R0682 Tachypnea, not elsewhere classified: Secondary | ICD-10-CM

## 2023-08-07 DIAGNOSIS — E039 Hypothyroidism, unspecified: Secondary | ICD-10-CM | POA: Diagnosis present

## 2023-08-07 DIAGNOSIS — U071 COVID-19: Principal | ICD-10-CM | POA: Diagnosis present

## 2023-08-07 DIAGNOSIS — I5023 Acute on chronic systolic (congestive) heart failure: Secondary | ICD-10-CM | POA: Diagnosis present

## 2023-08-07 DIAGNOSIS — I420 Dilated cardiomyopathy: Secondary | ICD-10-CM | POA: Diagnosis present

## 2023-08-07 DIAGNOSIS — E43 Unspecified severe protein-calorie malnutrition: Secondary | ICD-10-CM | POA: Diagnosis present

## 2023-08-07 DIAGNOSIS — I509 Heart failure, unspecified: Secondary | ICD-10-CM

## 2023-08-07 DIAGNOSIS — J9601 Acute respiratory failure with hypoxia: Secondary | ICD-10-CM | POA: Diagnosis present

## 2023-08-07 DIAGNOSIS — Z7982 Long term (current) use of aspirin: Secondary | ICD-10-CM

## 2023-08-07 DIAGNOSIS — I5022 Chronic systolic (congestive) heart failure: Secondary | ICD-10-CM

## 2023-08-07 DIAGNOSIS — Z79890 Hormone replacement therapy (postmenopausal): Secondary | ICD-10-CM

## 2023-08-07 DIAGNOSIS — M797 Fibromyalgia: Secondary | ICD-10-CM | POA: Diagnosis present

## 2023-08-07 DIAGNOSIS — Z681 Body mass index (BMI) 19 or less, adult: Secondary | ICD-10-CM

## 2023-08-07 DIAGNOSIS — R0902 Hypoxemia: Secondary | ICD-10-CM

## 2023-08-07 DIAGNOSIS — I255 Ischemic cardiomyopathy: Secondary | ICD-10-CM | POA: Diagnosis present

## 2023-08-07 HISTORY — DX: Heart failure, unspecified (CMS HCC): I50.9

## 2023-08-07 LAB — CBC WITH DIFF
BASOPHIL #: <0.1 10*3/uL (ref <=0.20)
BASOPHIL %: 0.6 %
EOSINOPHIL #: <0.1 10*3/uL (ref <=0.50)
EOSINOPHIL %: 0.8 %
HCT: 35.6 % (ref 34.8–46.0)
HGB: 12.4 g/dL (ref 11.5–16.0)
IMMATURE GRANULOCYTE #: <0.1 10*3/uL (ref <0.10)
IMMATURE GRANULOCYTE %: 0.2 % (ref 0.0–1.0)
LYMPHOCYTE #: 1.54 10*3/uL (ref 1.00–4.80)
LYMPHOCYTE %: 29 %
MCH: 32.1 pg — ABNORMAL HIGH (ref 26.0–32.0)
MCHC: 34.8 g/dL (ref 31.0–35.5)
MCV: 92.2 fL (ref 78.0–100.0)
MONOCYTE #: 0.45 10*3/uL (ref 0.20–1.10)
MONOCYTE %: 8.5 %
MPV: 9.2 fL (ref 8.7–12.5)
NEUTROPHIL #: 3.24 10*3/uL (ref 1.50–7.70)
NEUTROPHIL %: 60.9 %
PLATELETS: 217 10*3/uL (ref 150–400)
RBC: 3.86 10*6/uL (ref 3.85–5.22)
RDW-CV: 12.4 % (ref 11.5–15.5)
WBC: 5.3 10*3/uL (ref 3.7–11.0)

## 2023-08-07 LAB — BASIC METABOLIC PANEL
ANION GAP: 10 mmol/L (ref 4–13)
BUN/CREA RATIO: 19 (ref 6–22)
BUN: 13 mg/dL (ref 8–25)
CALCIUM: 8.9 mg/dL (ref 8.6–10.3)
CHLORIDE: 94 mmol/L — ABNORMAL LOW (ref 96–111)
CO2 TOTAL: 32 mmol/L — ABNORMAL HIGH (ref 23–31)
CREATININE: 0.69 mg/dL (ref 0.60–1.05)
ESTIMATED GFR - FEMALE: 84 mL/min/BSA (ref >=60)
GLUCOSE: 95 mg/dL (ref 65–125)
POTASSIUM: 3.5 mmol/L (ref 3.5–5.1)
SODIUM: 136 mmol/L (ref 136–145)

## 2023-08-07 LAB — COVID-19, FLU A/B, RSV RAPID BY PCR
INFLUENZA VIRUS TYPE A: NOT DETECTED
INFLUENZA VIRUS TYPE B: NOT DETECTED
RESPIRATORY SYNCTIAL VIRUS (RSV): NOT DETECTED
SARS-CoV-2: DETECTED — AB

## 2023-08-07 LAB — TROPONIN-I
TROPONIN-I HS: <2.7 ng/L (ref <=14.0)
TROPONIN-I HS: <2.7 ng/L (ref <=14.0)

## 2023-08-07 LAB — B-TYPE NATRIURETIC PEPTIDE (BNP),PLASMA: BNP: 263 pg/mL — ABNORMAL HIGH (ref 0–100)

## 2023-08-07 LAB — D-DIMER: D-DIMER: 703 ng/mL — ABNORMAL HIGH (ref 200–229)

## 2023-08-07 MED ORDER — SODIUM CHLORIDE 0.9 % (FLUSH) INJECTION SYRINGE
3.0000 mL | INJECTION | INTRAMUSCULAR | Status: DC | PRN
Start: 2023-08-07 — End: 2023-08-13

## 2023-08-07 MED ORDER — LACTULOSE 20 GRAM/30 ML ORAL SOLUTION
30.0000 mL | Freq: Three times a day (TID) | ORAL | Status: DC
Start: 2023-08-07 — End: 2023-08-13
  Administered 2023-08-07: 0 mL via ORAL
  Administered 2023-08-07: 30 mL via ORAL
  Administered 2023-08-08: 0 mL via ORAL
  Administered 2023-08-08 – 2023-08-12 (×13): 30 mL via ORAL
  Administered 2023-08-12 – 2023-08-13 (×2): 0 mL via ORAL
  Filled 2023-08-07 (×16): qty 30

## 2023-08-07 MED ORDER — NIRMATRELVIR 150 MG-RITONAVIR 100 MG TABLETS IN A DOSE PACK
2.0000 | ORAL_TABLET | Freq: Two times a day (BID) | ORAL | Status: AC
Start: 2023-08-07 — End: 2023-08-12
  Administered 2023-08-08 – 2023-08-12 (×10): 2 via ORAL
  Filled 2023-08-07 (×10): qty 2

## 2023-08-07 MED ORDER — ONDANSETRON HCL (PF) 4 MG/2 ML INJECTION SOLUTION
4.0000 mg | Freq: Four times a day (QID) | INTRAMUSCULAR | Status: DC | PRN
Start: 2023-08-07 — End: 2023-08-13

## 2023-08-07 MED ORDER — ACETAMINOPHEN 325 MG TABLET
650.0000 mg | ORAL_TABLET | Freq: Four times a day (QID) | ORAL | Status: DC | PRN
Start: 2023-08-07 — End: 2023-08-13

## 2023-08-07 MED ORDER — IOPAMIDOL 370 MG IODINE/ML (76 %) INTRAVENOUS SOLUTION
70.0000 mL | INTRAVENOUS | Status: AC
Start: 2023-08-07 — End: 2023-08-07
  Administered 2023-08-07: 45 mL via INTRAVENOUS

## 2023-08-07 MED ORDER — LISINOPRIL 5 MG TABLET
2.5000 mg | ORAL_TABLET | Freq: Every day | ORAL | Status: DC
Start: 2023-08-07 — End: 2023-08-13
  Administered 2023-08-07: 0 mg via ORAL
  Administered 2023-08-08: 2.5 mg via ORAL
  Administered 2023-08-09: 0 mg via ORAL
  Administered 2023-08-10 – 2023-08-11 (×2): 2.5 mg via ORAL
  Administered 2023-08-12: 0 mg via ORAL
  Administered 2023-08-13: 2.5 mg via ORAL
  Filled 2023-08-07 (×6): qty 1

## 2023-08-07 MED ORDER — ATORVASTATIN 40 MG TABLET
40.0000 mg | ORAL_TABLET | Freq: Every evening | ORAL | Status: DC
Start: 2023-08-07 — End: 2023-08-13

## 2023-08-07 MED ORDER — ALUMINUM-MAG HYDROXIDE-SIMETHICONE 200 MG-200 MG-20 MG/5 ML ORAL SUSP
20.0000 mL | ORAL | Status: DC | PRN
Start: 2023-08-07 — End: 2023-08-13

## 2023-08-07 MED ORDER — ASPIRIN 81 MG TABLET,DELAYED RELEASE
81.0000 mg | DELAYED_RELEASE_TABLET | Freq: Every day | ORAL | Status: DC
Start: 2023-08-07 — End: 2023-08-13
  Administered 2023-08-07: 0 mg via ORAL
  Administered 2023-08-08 – 2023-08-13 (×6): 81 mg via ORAL
  Filled 2023-08-07 (×6): qty 1

## 2023-08-07 MED ORDER — ENOXAPARIN 30 MG/0.3 ML SUBCUTANEOUS SYRINGE
30.0000 mg | INJECTION | SUBCUTANEOUS | Status: DC
Start: 2023-08-07 — End: 2023-08-13
  Administered 2023-08-07 – 2023-08-12 (×6): 30 mg via SUBCUTANEOUS
  Filled 2023-08-07 (×6): qty 0.3

## 2023-08-07 MED ORDER — METOCLOPRAMIDE 5 MG TABLET
5.0000 mg | ORAL_TABLET | Freq: Three times a day (TID) | ORAL | Status: DC
Start: 2023-08-07 — End: 2023-08-13
  Administered 2023-08-07 – 2023-08-13 (×18): 5 mg via ORAL
  Filled 2023-08-07 (×11): qty 1
  Filled 2023-08-07 (×2): qty 0.5
  Filled 2023-08-07 (×2): qty 1
  Filled 2023-08-07: qty 0.5
  Filled 2023-08-07 (×2): qty 1
  Filled 2023-08-07: qty 0.5
  Filled 2023-08-07 (×3): qty 1

## 2023-08-07 MED ORDER — SODIUM CHLORIDE 0.9 % (FLUSH) INJECTION SYRINGE
3.0000 mL | INJECTION | Freq: Three times a day (TID) | INTRAMUSCULAR | Status: DC
Start: 2023-08-07 — End: 2023-08-13
  Administered 2023-08-07 – 2023-08-08 (×3): 0 mL
  Administered 2023-08-08 – 2023-08-09 (×5): 3 mL
  Administered 2023-08-10: 0 mL
  Administered 2023-08-10 (×2): 3 mL
  Administered 2023-08-11 (×2): 0 mL
  Administered 2023-08-11: 3 mL
  Administered 2023-08-12: 0 mL
  Administered 2023-08-12: 3 mL
  Administered 2023-08-12 – 2023-08-13 (×3): 0 mL

## 2023-08-07 MED ORDER — CARVEDILOL 3.125 MG TABLET
3.1250 mg | ORAL_TABLET | Freq: Two times a day (BID) | ORAL | Status: DC
Start: 2023-08-07 — End: 2023-08-13
  Administered 2023-08-07 – 2023-08-09 (×4): 3.125 mg via ORAL
  Administered 2023-08-09: 0 mg via ORAL
  Administered 2023-08-10 – 2023-08-13 (×7): 3.125 mg via ORAL
  Filled 2023-08-07 (×11): qty 1

## 2023-08-07 MED ORDER — FUROSEMIDE 10 MG/ML INJECTION SOLUTION
20.0000 mg | Freq: Two times a day (BID) | INTRAMUSCULAR | Status: DC
Start: 2023-08-07 — End: 2023-08-13
  Administered 2023-08-07 – 2023-08-08 (×2): 20 mg via INTRAVENOUS
  Administered 2023-08-08 – 2023-08-09 (×3): 0 mg via INTRAVENOUS
  Administered 2023-08-10: 20 mg via INTRAVENOUS
  Administered 2023-08-10: 0 mg via INTRAVENOUS
  Administered 2023-08-11 – 2023-08-12 (×3): 20 mg via INTRAVENOUS
  Administered 2023-08-12: 0 mg via INTRAVENOUS
  Administered 2023-08-13: 20 mg via INTRAVENOUS
  Filled 2023-08-07 (×4): qty 2
  Filled 2023-08-07: qty 4
  Filled 2023-08-07 (×6): qty 2

## 2023-08-07 MED ORDER — SENNOSIDES 8.6 MG-DOCUSATE SODIUM 50 MG TABLET
1.0000 | ORAL_TABLET | Freq: Two times a day (BID) | ORAL | Status: DC
Start: 2023-08-07 — End: 2023-08-13
  Administered 2023-08-07 – 2023-08-13 (×12): 1 via ORAL
  Filled 2023-08-07 (×12): qty 1

## 2023-08-07 MED ORDER — SODIUM CHLORIDE 0.9 % (FLUSH) INJECTION SYRINGE
10.0000 mL | INJECTION | INTRAMUSCULAR | Status: AC
Start: 2023-08-07 — End: 2023-08-07
  Administered 2023-08-07: 10 mL via INTRAVENOUS

## 2023-08-07 MED ORDER — LEVOTHYROXINE 75 MCG TABLET
75.0000 ug | ORAL_TABLET | Freq: Every morning | ORAL | Status: DC
Start: 2023-08-07 — End: 2023-08-13
  Administered 2023-08-07: 0 ug via ORAL
  Administered 2023-08-08 – 2023-08-13 (×6): 75 ug via ORAL
  Filled 2023-08-07 (×6): qty 1

## 2023-08-07 MED ORDER — SIMETHICONE 80 MG CHEWABLE TABLET
160.0000 mg | CHEWABLE_TABLET | Freq: Four times a day (QID) | ORAL | Status: DC
Start: 2023-08-07 — End: 2023-08-13
  Administered 2023-08-07: 160 mg via ORAL
  Administered 2023-08-07 (×2): 0 mg via ORAL
  Administered 2023-08-08 – 2023-08-11 (×16): 160 mg via ORAL
  Administered 2023-08-12 (×2): 0 mg via ORAL
  Administered 2023-08-12 – 2023-08-13 (×3): 160 mg via ORAL
  Filled 2023-08-07 (×21): qty 2

## 2023-08-07 NOTE — ED Nurses Note (Signed)
This nurse took over care of patient,   Patient sitting in bed, meal tray and meds given by Durenda Age, RN   Cardiac and vital monitoring in place,   Call bell in reach,   No needs at this time

## 2023-08-07 NOTE — ED Attending Note (Signed)
I was physically present and directly supervised this patient's care.  Patient seen and examined with Dr Virgel Manifold.  Resident history and exam reviewed.   Key elements in addition to and/or correction of that documentation are as follows:    HPI :    89 y.o. female presents with chief complaint of shortness a breath.  89 year old female with a history of heart failure with reduced ejection fraction presenting for evaluation with chief complaint of shortness a breath.  Symptoms worsening over the past several days.  Patient was admitted to the hospital with recurrent pleural effusions earlier this month.  Comes in for evaluation of her breathing which she feels like it is getting worse.  No lower extremity swelling.  No chest pain or vomiting.  No fever or chills.    PE :   VS on presentation: BP 118/78   Pulse 97   Temp 36.7 C (98 F)   Resp 18   Ht 1.575 m (5\' 2" )   LMP  (LMP Unknown)   SpO2 98%   BMI 13.35 kg/m      I have seen and examined with resident and agree with exam except as documented differently below.      Nursing note and vitals reviewed.      Constitutional: Pt is alert and oriented and appears well-developed and well-nourished. No acute distress.   HENT:   Mouth/Throat: Oropharynx is clear and moist.   Eyes: Conjunctivae are clear.   Neck: Normal range of motion. Neck supple.   Cardiovascular: Normal rate, regular rhythm and intact distal pulses.    Pulmonary/Chest: Effort normal and no respiratory distress.   Abdominal: Abdomen is soft, nontender, nondistended.    Musculoskeletal: Normal range of motion.   Neurological: Pt is alert and oriented. Cranial nerves are grossly intact (2-12).    Skin: Skin is warm and dry without rash.  Color normal.       Data/Test :    EKG :  Sinus rhythm rate of 93 left axis first-degree AV block PR interval of 234 LVH is present no acute ST elevation or depression  Images Review by me : None  Image Reports:    Labs :     Review of Prior Data :       Prior  Images :    Prior EKG :   Online Medical Records : EPIC  Transfer Docs/Images :     MDM:  89 year old female presenting for evaluation with shortness a breath.    Workup shows changes consistent with COVID and a mild left-sided pleural effusion.  Patient hypoxic on ambulation into the 80s.  We will start diuresis and admit for hypoxia to primary care provider.    Clinical Impression :   Shortness a breath, hypoxia, COVID      ED Course :   Per Dr Resident     Plan :   Per Dr Resident    Dispo :   Per Dr Resident    CRITICAL CARE :

## 2023-08-07 NOTE — ED Triage Notes (Signed)
Will need bed weight.

## 2023-08-07 NOTE — ED Nurses Note (Signed)
Report called to Abby, RN on 4N.

## 2023-08-07 NOTE — H&P (Signed)
General Medicine  Admission H&P    Date of Service:  08/07/2023  [name], [name], 89 y.o. female  Date of Admission:  08/07/2023  Date of Birth:  [birth year]  PCP: Haley Floras, MD          Information Obtained from: patient and history reviewed via medical record  Chief Complaint:  Shortness a breath    HPI: [name] is a 89 y.o., White female who presents with shortness a breath.  Family member at home with a cough.  Patient does not require home oxygen.  Brought to the emergency room.  Pulse ox was 85% on room air.  Positive for COVID.  Otherwise asymptomatic.      Patient has a known dilated ischemic cardiomyopathy.  Ejection fraction proximally 20%.  Has a history of a left pleural effusion that had a thoracentesis several months ago.  300 cc were removed at that time.  Transudate.  Minimal if any reoccurrence.  Patient is compliant with medications.  There has been no change in baseline 2 pillow orthopnea.  There has been no PND or pedal edema.  No chest pain or palpitations reported    PAST MEDICAL:    Past Medical History:   Diagnosis Date    Fibromyalgia     Hypercholesterolemia     Thyroid disease         Past Surgical History:   Procedure Laterality Date    HX BREAST LUMPECTOMY Right             Medications Prior to Admission       Prescriptions    aspirin (ECOTRIN) 81 mg Oral Tablet, Delayed Release (E.C.)    Take 1 Tablet (81 mg total) by mouth Once a day for 360 days    atorvastatin (LIPITOR) 40 mg Oral Tablet    Take 1 Tablet (40 mg total) by mouth Every evening    carvediloL (COREG) 3.125 mg Oral Tablet    Take 1 Tablet (3.125 mg total) by mouth Twice daily with food for 360 days    furosemide (LASIX) 40 mg Oral Tablet    Take 1 Tablet (40 mg total) by mouth Twice daily    lactulose (ENULOSE) 20 gram/30 mL Oral Solution    Take 30 mL by mouth Three times a day for 846 doses    levothyroxine (SYNTHROID) 75 mcg Oral Tablet    Take 1 Tablet (75 mcg total) by mouth Every morning    lidocaine  4 % Adhesive Patch, Medicated    Place 1 Patch on the skin Once a day    lisinopriL (PRINIVIL) 2.5 mg Oral Tablet    Take 1 Tablet (2.5 mg total) by mouth Once a day for 360 days    metoclopramide HCl (REGLAN) 5 mg Oral Tablet    Take 1 Tablet (5 mg total) by mouth Three times daily before meals    sennosides-docusate sodium (SENOKOT-S) 8.6-50 mg Oral Tablet    Take 1 Tablet by mouth Twice daily for 360 days    simethicone (MYLICON) 80 mg Oral Tablet, Chewable    Chew and swallow 2 Tablets (160 mg total) by mouth Four times a day          Allergies   Allergen Reactions    Lactose Rash    Grass Pollen        Family History  Family Medical History:       Problem Relation (Age of Onset)    Hypertension (High  Blood Pressure) Mother, Father               Social History  Social History     Tobacco Use    Smoking status: Never    Smokeless tobacco: Never   Substance Use Topics    Alcohol use: Never        ROS:  Remainder review of systems was negative were appear to be at baseline and noncontributory    Examination:  Temperature: 36.7 C (98 F) Heart Rate: 88 BP (Non-Invasive): 118/75   Respiratory Rate: 17 SpO2: 95 %       Vitals: BP 118/75   Pulse 88   Temp 36.7 C (98 F)   Resp 17   Ht 1.575 m (5\' 2" )   LMP  (LMP Unknown)   SpO2 95%   BMI 13.35 kg/m       General: no distress  Eyes: Conjunctiva clear., Sclera non-icteric.   Lungs:  A few crackles in the bases bilaterally.  No bronchospasm.  Cardiovascular:    Heart regular rate and rhythm, S1, S2 normal, no murmur, click, rub or gallop and    Vascular   pulses 1+ throughout  Abdomen: soft, non-tender, bowel sounds normal, and non-distended  Extremities: no cyanosis or edema  Skin: Skin warm and dry    Labs:    Lab Results Today:    Results for orders placed or performed during the hospital encounter of 08/07/23 (from the past 24 hour(s))   ECG 12 LEAD - ED USE   Result Value Ref Range    Ventricular rate 93 BPM    Atrial Rate 93 BPM    PR Interval 234 ms     QRS Duration 116 ms    QT Interval 400 ms    QTC Calculation 497 ms    Calculated P Axis 0 degrees    Calculated R Axis -28 degrees    Calculated T Axis 133 degrees   BASIC METABOLIC PANEL   Result Value Ref Range    SODIUM 136 136 - 145 mmol/L    POTASSIUM 3.5 3.5 - 5.1 mmol/L    CHLORIDE 94 (L) 96 - 111 mmol/L    CO2 TOTAL 32 (H) 23 - 31 mmol/L    ANION GAP 10 4 - 13 mmol/L    CALCIUM 8.9 8.6 - 10.3 mg/dL    GLUCOSE 95 65 - 540 mg/dL    BUN 13 8 - 25 mg/dL    CREATININE 9.81 1.91 - 1.05 mg/dL    BUN/CREA RATIO 19 6 - 22    ESTIMATED GFR - FEMALE 84 >=60 mL/min/BSA   B-TYPE NATRIURETIC PEPTIDE (BNP),PLASMA   Result Value Ref Range    BNP 263 (H) 0 - 100 pg/mL   D-DIMER   Result Value Ref Range    D-DIMER 703 (H) 200 - 229 ng/mL DDU   CBC WITH DIFF   Result Value Ref Range    WBC 5.3 3.7 - 11.0 x10^3/uL    RBC 3.86 3.85 - 5.22 x10^6/uL    HGB 12.4 11.5 - 16.0 g/dL    HCT 47.8 29.5 - 62.1 %    MCV 92.2 78.0 - 100.0 fL    MCH 32.1 (H) 26.0 - 32.0 pg    MCHC 34.8 31.0 - 35.5 g/dL    RDW-CV 30.8 65.7 - 84.6 %    PLATELETS 217 150 - 400 x10^3/uL    MPV 9.2 8.7 - 12.5 fL    NEUTROPHIL % 60.9 %  LYMPHOCYTE % 29.0 %    MONOCYTE % 8.5 %    EOSINOPHIL % 0.8 %    BASOPHIL % 0.6 %    NEUTROPHIL # 3.24 1.50 - 7.70 x10^3/uL    LYMPHOCYTE # 1.54 1.00 - 4.80 x10^3/uL    MONOCYTE # 0.45 0.20 - 1.10 x10^3/uL    EOSINOPHIL # <0.10 <=0.50 x10^3/uL    BASOPHIL # <0.10 <=0.20 x10^3/uL    IMMATURE GRANULOCYTE % 0.2 0.0 - 1.0 %    IMMATURE GRANULOCYTE # <0.10 <0.10 x10^3/uL   TROPONIN-I   Result Value Ref Range    TROPONIN-I HS <2.7 <=14.0 ng/L ng/L   COVID-19, FLU A/B, RSV RAPID BY PCR   Result Value Ref Range    SARS-CoV-2 Detected (A) Not Detected    INFLUENZA VIRUS TYPE A Not Detected Not Detected    INFLUENZA VIRUS TYPE B Not Detected Not Detected    RESPIRATORY SYNCTIAL VIRUS (RSV) Not Detected Not Detected   TROPONIN-I   Result Value Ref Range    TROPONIN-I HS <2.7 <=14.0 ng/L ng/L       Imaging Studies:  Reviewed  Results for  orders placed or performed during the hospital encounter of 08/07/23 (from the past 24 hour(s))   XR AP MOBILE CHEST     Status: None    Narrative    [name]    PROCEDURE DESCRIPTION: XR AP MOBILE CHEST    CLINICAL INDICATION: SOB    TECHNIQUE: 1 views / 1 images submitted.    COMPARISON: 07/13/2023      FINDINGS: Portal chest shows scoliosis. Pleural effusion on the left persists. Biapical pleural thickening is again seen. Vascularity is normal. No pneumothorax is evident. Degenerative changes are present at the shoulders.      Impression    Impression: Left pleural effusion and biapical pleural thickening. No new abnormality.                Radiologist location ID: [id number]     CT ANGIO CHEST FOR PULMONARY EMBOLUS W IV CONTRAST     Status: None    Narrative    [name]    PROCEDURE DESCRIPTION: CT ANGIO CHEST FOR PULMONARY EMBOLUS W IV CONTRAST    CTA chest was performed to evaluate for pulmonary embolism with 3D/MIP reconstructions generated.    CLINICAL INDICATION: shortness of breath, positive d-dimer    COMPARISON: 06/29/2023      FINDINGS: There is a small to moderate-sized left pleural effusion with associated atelectasis of the left lower lobe. Trace right pleural fluid is also seen. There is no focal consolidation or pneumothorax. The central airways are clear.    There is no pulmonary embolus. The right atrium is enlarged but otherwise the heart is within normal exercise with no pericardial effusion. There is no mediastinal or hilar adenopathy or mass.    Included portions of the abdomen demonstrate no acute abnormality. There is scoliosis of the thoracic spine with straightening of thoracic kyphosis. Compression fractures are again noted at T6, T11, T12, and L1, unchanged from the previous exam.      Impression    1. No pulmonary embolus.  2. Small to moderate left pleural effusion with associated atelectasis, slightly decreased in size from the previous exam.            The CT  exam was performed using one or more of the following dose reduction techniques: Automated exposure control, adjustment of the mA and/or kV according to the  patient's size, or use of iterative reconstruction technique.        Radiologist location ID: [id number]         DNR Status:  FULL CODE: ATTEMPT RESUSCITATION/CPR    Assessment/Plan:   Active Hospital Problems    Diagnosis    Primary Problem: COVID     1. Acute COVID infection.  Admit to the hospital.  Will utilize IV Paxlovid.  Adjust medications.  Monitor closely.      2. Acute hypoxic respiratory failure secondary to COVID.  Supplement with oxygen.  Will require oxygen challenge prior to discharge.    3. Chronic systolic congestive heart failure.  Continue medications and adjust based on laboratory studies     4. Severe protein calorie malnutrition.  Encourage p.o. intake.  Patient on a pureed diet.  Will add supplements.          Haley Floras, MD 08/07/2023 15:51

## 2023-08-07 NOTE — ED Nurses Note (Signed)
Ambulated patient, spo2 dropped to 86% with a heart rate of 110.

## 2023-08-07 NOTE — ED Provider Notes (Signed)
[facility] - Emergency Department  ED Primary Provider Note  History of Present Illness   Chief Complaint   Patient presents with    Shortness of Breath     Family reports increasing shortness of breath. States hx of CHF. Concerned she is "building up with fluid."     [name] is a 89 y.o. female who had concerns including Shortness of Breath.  Arrival: The patient arrived by Private Vehicle    Patient came to ED for 2 day history of worsening SOB. Has significant history of CHF with 35-40% EF, recent admission for left pleural effusion. Patient is worried that she might be building up fluid in her lungs. Patient denies headache, N/V, cough, congestion, febrile episodes, chest pain, abdominal pain and worsening bipedal edema. Per patient's daughter, no change in appetite, sudden weight change.      Shortness of Breath    History Reviewed This Encounter: Medical History  Surgical History  Family History  Social History    Physical Exam   ED Triage Vitals [08/07/23 0932]   BP (Non-Invasive) 118/78   Heart Rate 97   Respiratory Rate 18   Temperature 36.7 C (98 F)   SpO2 98 %   Weight    Height 1.575 m (5\' 2" )     Physical Exam  Constitutional:       Appearance: Normal appearance.   HENT:      Head: Normocephalic.      Mouth/Throat:      Mouth: Mucous membranes are moist.      Pharynx: Oropharynx is clear.   Eyes:      Extraocular Movements: Extraocular movements intact.      Conjunctiva/sclera: Conjunctivae normal.      Pupils: Pupils are equal, round, and reactive to light.   Cardiovascular:      Rate and Rhythm: Normal rate and regular rhythm.      Pulses: Normal pulses.      Heart sounds: Normal heart sounds.   Pulmonary:      Effort: Pulmonary effort is normal.      Breath sounds: Normal breath sounds.      Comments: Occasional fine crackles on lung bases.  Abdominal:      General: Bowel sounds are normal.      Palpations: Abdomen is soft.      Tenderness: There is no abdominal  tenderness.   Musculoskeletal:         General: No swelling or tenderness. Normal range of motion.      Cervical back: Normal range of motion.      Right lower leg: Edema present.      Left lower leg: Edema present.      Comments: +1 bipedal edema   Skin:     General: Skin is warm.      Capillary Refill: Capillary refill takes less than 2 seconds.   Neurological:      General: No focal deficit present.      Mental Status: She is alert.   Psychiatric:         Mood and Affect: Mood normal.         Behavior: Behavior normal.       Patient Data     Labs Ordered/Reviewed   BASIC METABOLIC PANEL - Abnormal; Notable for the following components:       Result Value    CHLORIDE 94 (*)     CO2 TOTAL 32 (*)     All other  components within normal limits   B-TYPE NATRIURETIC PEPTIDE (BNP),PLASMA - Abnormal; Notable for the following components:    BNP 263 (*)     All other components within normal limits   D-DIMER - Abnormal; Notable for the following components:    D-DIMER 703 (*)     All other components within normal limits   CBC WITH DIFF - Abnormal; Notable for the following components:    MCH 32.1 (*)     All other components within normal limits   COVID-19, FLU A/B, RSV RAPID BY PCR - Abnormal; Notable for the following components:    SARS-CoV-2 Detected (*)     All other components within normal limits    Narrative:     Results are for the simultaneous qualitative identification of SARS-CoV-2 (formerly 2019-nCoV), Influenza A, Influenza B, and RSV RNA. These etiologic agents are generally detectable in nasopharyngeal and nasal swabs during the ACUTE PHASE of infection. Hence, this test is intended to be performed on respiratory specimens collected from individuals with signs and symptoms of upper respiratory tract infection who meet Centers for Disease Control and Prevention (CDC) clinical and/or epidemiological criteria for Coronavirus Disease 2019 (COVID-19) testing. CDC COVID-19 criteria for testing on human specimens  is available at [facility] webpage information for Healthcare Professionals: Coronavirus Disease 2019 (COVID-19) ([email or web address]).     False-negative results may occur if the virus has genomic mutations, insertions, deletions, or rearrangements or if performed very early in the course of illness. Otherwise, negative results indicate virus specific RNA targets are not detected, however negative results do not preclude SARS-CoV-2 infection/COVID-19, Influenza, or Respiratory syncytial virus infection. Results should not be used as the sole basis for patient management decisions. Negative results must be combined with clinical observations, patient history, and epidemiological information. If upper respiratory tract infection is still suspected based on exposure history together with other clinical findings, re-testing should be considered.    Test methodology:   Cepheid Xpert Xpress SARS-CoV-2/Flu/RSV Assay real-time polymerase chain reaction (RT-PCR) test on the GeneXpert Dx and Xpert Xpress systems.   TROPONIN-I - Normal   CBC/DIFF    Narrative:     The following orders were created for panel order CBC/DIFF.  Procedure                               Abnormality         Status                     ---------                               -----------         ------                     CBC WITH AOZH[[id number]]                Abnormal            Final result                 Please view results for these tests on the individual orders.     CT ANGIO CHEST FOR PULMONARY EMBOLUS W IV CONTRAST   Final Result by Edi, Radresults In (01/24 1208)   1. No pulmonary embolus.   2. Small to moderate left pleural effusion with associated atelectasis,  slightly decreased in size from the previous exam.                  The CT exam was performed using one or more of the following dose reduction techniques: Automated exposure control, adjustment of the mA and/or kV according to the patient's size, or use  of iterative reconstruction technique.            Radiologist location ID: [id number]         XR AP MOBILE CHEST   Final Result by Edi, Radresults In (01/24 1106)   Impression: Left pleural effusion and biapical pleural thickening. No new abnormality.                        Radiologist location ID: [facility]           Medical Decision Making        Medical Decision Making  Patient seen and examined, history reviewed. Patient currently stable vitals but tachypneic 22-28cpm saturating well on RA, shallow breather.  CXR positive for left pleural effusion which is significantly less in volume compared to prior studies. Tested positive for COVID and an elevated d-dimer. CT PE negative for pulmonary embolism. Planning to discharge patient after ambulatory trial.    On reassessment, still stable on RA at rest, tachypnea still present. Failed ambulatory challenge with 86% and 110bpm that normalized at rest. Dr. Melina Modena paged and agreed to admit patient under his service.    Amount and/or Complexity of Data Reviewed  Labs: ordered.  Radiology: ordered.  ECG/medicine tests: ordered.      ED Course as of 08/07/23 1325   Fri Aug 07, 2023   1117 D-DIMER (QUANT)(!): 703   1246 Failed ambulatory challenge. 86% and 110bpm   1255 Dr. Jules Schick paged and returned call. Will admit patient.            Medications Administered in the ED   iopamidol (ISOVUE-370) 76% infusion (45 mL Intravenous Given 08/07/23 1149)   NS flush syringe (10 mL Intravenous Given 08/07/23 1148)     Clinical Impression   COVID-19 (Primary)       Disposition: Admitted

## 2023-08-08 ENCOUNTER — Other Ambulatory Visit: Payer: Self-pay

## 2023-08-08 LAB — BASIC METABOLIC PANEL
ANION GAP: 8 mmol/L (ref 4–13)
BUN/CREA RATIO: 18 (ref 6–22)
BUN: 15 mg/dL (ref 8–25)
CALCIUM: 8.9 mg/dL (ref 8.6–10.3)
CHLORIDE: 94 mmol/L — ABNORMAL LOW (ref 96–111)
CO2 TOTAL: 36 mmol/L — ABNORMAL HIGH (ref 23–31)
CREATININE: 0.83 mg/dL (ref 0.60–1.05)
ESTIMATED GFR - FEMALE: 69 mL/min/BSA (ref >=60)
GLUCOSE: 84 mg/dL (ref 65–125)
POTASSIUM: 3.2 mmol/L — ABNORMAL LOW (ref 3.5–5.1)
SODIUM: 138 mmol/L (ref 136–145)

## 2023-08-08 LAB — CBC WITH DIFF
BASOPHIL #: <0.1 10*3/uL (ref <=0.20)
BASOPHIL %: 1 %
EOSINOPHIL #: <0.1 10*3/uL (ref <=0.50)
EOSINOPHIL %: 1.4 %
HCT: 35.8 % (ref 34.8–46.0)
HGB: 12.3 g/dL (ref 11.5–16.0)
IMMATURE GRANULOCYTE #: <0.1 10*3/uL (ref <0.10)
IMMATURE GRANULOCYTE %: 0.2 % (ref 0.0–1.0)
LYMPHOCYTE #: 1.54 10*3/uL (ref 1.00–4.80)
LYMPHOCYTE %: 30.7 %
MCH: 31.9 pg (ref 26.0–32.0)
MCHC: 34.4 g/dL (ref 31.0–35.5)
MCV: 92.7 fL (ref 78.0–100.0)
MONOCYTE #: 0.45 10*3/uL (ref 0.20–1.10)
MONOCYTE %: 9 %
MPV: 9.2 fL (ref 8.7–12.5)
NEUTROPHIL #: 2.89 10*3/uL (ref 1.50–7.70)
NEUTROPHIL %: 57.7 %
PLATELETS: 221 10*3/uL (ref 150–400)
RBC: 3.86 10*6/uL (ref 3.85–5.22)
RDW-CV: 12.4 % (ref 11.5–15.5)
WBC: 5 10*3/uL (ref 3.7–11.0)

## 2023-08-08 LAB — MAGNESIUM: MAGNESIUM: 2.3 mg/dL (ref 1.8–2.6)

## 2023-08-08 LAB — TROPONIN-I
TROPONIN-I HS: <2.7 ng/L (ref <=14.0)
TROPONIN-I HS: <2.7 ng/L (ref <=14.0)

## 2023-08-08 LAB — PHOSPHORUS: PHOSPHORUS: 3.4 mg/dL (ref 2.3–4.0)

## 2023-08-08 LAB — PT/INR: INR: 1.08 (ref 0.80–1.10)

## 2023-08-08 MED ORDER — POTASSIUM CHLORIDE ER 10 MEQ TABLET,EXTENDED RELEASE
10.0000 meq | ORAL_TABLET | Freq: Two times a day (BID) | ORAL | Status: DC
Start: 2023-08-08 — End: 2023-08-13
  Administered 2023-08-08 – 2023-08-13 (×10): 10 meq via ORAL
  Filled 2023-08-08 (×10): qty 1

## 2023-08-08 NOTE — Care Plan (Signed)
Problem: Adult Inpatient Plan of Care  Goal: Plan of Care Review  Reactivated  Goal: Patient-Specific Goal (Individualized)  Reactivated  Goal: Absence of Hospital-Acquired Illness or Injury  Reactivated  Intervention: Identify and Manage Fall Risk  Recent Flowsheet Documentation  Taken 08/08/2023 0430 by Courtney-Dee K, GN  Safety Promotion/Fall Prevention:   activity supervised   fall prevention program maintained   safety round/check completed  Taken 08/08/2023 0210 by Courtney-Dee K, GN  Safety Promotion/Fall Prevention:   activity supervised   fall prevention program maintained   safety round/check completed  Intervention: Prevent Skin Injury  Recent Flowsheet Documentation  Taken 08/08/2023 0430 by Courtney-Dee K, GN  Body Position: supine, head elevated  Skin Protection:   adhesive use limited   incontinence pads utilized   transparent dressing maintained   tubing/devices free from skin contact  Taken 08/08/2023 0210 by Courtney-Dee K, GN  Body Position: supine, head elevated  Intervention: Prevent Infection  Recent Flowsheet Documentation  Taken 08/08/2023 0430 by Courtney-Dee K, GN  Infection Prevention:   barrier precautions utilized   personal protective equipment utilized  Taken 08/08/2023 0210 by Courtney-Dee K, GN  Infection Prevention:   barrier precautions utilized   personal protective equipment utilized  Goal: Optimal Comfort and Wellbeing  Reactivated  Goal: Rounds/Family Conference  Reactivated     Problem: Health Knowledge, Opportunity to Enhance (Adult,Obstetrics,Pediatric)  Goal: Knowledgeable about Health Subject/Topic  Description: Patient will demonstrate the desired outcomes by discharge/transition of care.  Reactivated     Problem: Gas Exchange Impaired  Goal: Optimal Gas Exchange  Reactivated  Intervention: Optimize Oxygenation and Ventilation  Recent Flowsheet Documentation  Taken 08/08/2023 0430 by Courtney-Dee K, GN  Head of Bed (HOB) Positioning: HOB at 45 degrees  Taken 08/08/2023 0210 by  Courtney-Dee K, GN  Head of Bed (HOB) Positioning: HOB at 45 degrees     Problem: COVID-19 Confirmed or Rule-Out  Description: Resident requires specific care to avoid complications secondary to COVID-19  Goal: Resident will remain free of complications due to COVID-19  Description: 1. Review and update the resident's isolation status in the Isolation activity  2. Keep the resident's door closed at all times and limit movement of the resident outside of the room to medically essential purposes. If applicable, transfer resident to a single-person room   3. Educate and reinforce infection prevention and control practices recommended by CDC  4. Frequently monitor for development of more severe symptoms   5. Use appropriate PPE when providing care for resident  6. Reinforce no visitor policy and non-essential health care personnel policy, except for certain compassionate care situations  7. If worsening of symptoms occur, alert the nearest Hospital caring for confirmed COVID-19 patients and arrange for transfer with proper precautions including placing a facemask on the resident during transfer  8. Communicate information about known or suspected case of COVID-19 to appropraite public health personnel  9. Avoid procedures that are likely to induce coughing (e.g., sputum induction, open suctioning of airways). If required, do so in a Airborne Infection Isolation Room. The health care provider in the room should wear an N95 or higher-level respirator, eye protection, gloves, and a gown. The number of HCP present during the procedure should be limited to only those essential for resident care and procedure support. Visitors should not be present for the procedure. Clean and disinfect procedure room surfaces promptly  10. Update resident, family, and resident representatives as needed      Reactivated     Problem:  Heart Failure  Goal: Optimal Coping  Reactivated  Goal: Optimal Cardiac Output and Blood  Flow  Reactivated  Goal: Stable Heart Rate and Rhythm  Reactivated  Goal: Fluid and Electrolyte Balance  Reactivated  Goal: Optimal Functional Ability  Reactivated  Goal: Improved Oral Intake  Reactivated  Goal: Effective Oxygenation and Ventilation  Reactivated  Intervention: Optimize Oxygenation and Ventilation  Recent Flowsheet Documentation  Taken 08/08/2023 0430 by Courtney-Dee K, GN  Head of Bed (HOB) Positioning: HOB at 45 degrees  Taken 08/08/2023 0210 by Courtney-Dee K, GN  Head of Bed (HOB) Positioning: HOB at 45 degrees  Goal: Effective Breathing Pattern During Sleep  Reactivated     Problem: Fall Injury Risk  Goal: Absence of Fall and Fall-Related Injury  Reactivated  Intervention: Promote Injury-Free Environment  Recent Flowsheet Documentation  Taken 08/08/2023 0430 by Courtney-Dee K, GN  Safety Promotion/Fall Prevention:   activity supervised   fall prevention program maintained   safety round/check completed  Taken 08/08/2023 0210 by Courtney-Dee K, GN  Safety Promotion/Fall Prevention:   activity supervised   fall prevention program maintained   safety round/check completed     Problem: Skin Injury Risk Increased  Goal: Skin Health and Integrity  Reactivated  Intervention: Optimize Skin Protection  Recent Flowsheet Documentation  Taken 08/08/2023 0430 by Courtney-Dee K, GN  Pressure Reduction Techniques: Frequent weight shifting encouraged  Pressure Reduction Devices: Repositioning wedges/pillows utilized  Skin Protection:   adhesive use limited   incontinence pads utilized   transparent dressing maintained   tubing/devices free from skin contact  Head of Bed (HOB) Positioning: HOB at 45 degrees  Taken 08/08/2023 0210 by Courtney-Dee K, GN  Head of Bed (HOB) Positioning: HOB at 45 degrees     Courtney-Dee Kortlyn Koltz, GN

## 2023-08-08 NOTE — Progress Notes (Signed)
Internal Medicine Progress Note    Names:  [name]  Date of service: 08/08/2023  Date of Admission:  08/07/2023  Hospital Day:  LOS: 1 day     Assessment/ Plan:   Active Hospital Problems    Diagnosis    Primary Problem: COVID       1. Acute COVID infection.  Continue Paxlovid.  Not demonstrating any systemic symptoms    2. Acute hypoxic respiratory failure secondary to COVID.  Will require oxygen challenge prior to discharge    3. Chronic systolic congestive heart failure.  Appears to be stable and euvolemic at this time    4. Severe protein calorie malnutrition.  Encourage p.o. intake.     PT/OT: Yes    Disposition Planning:   Home with home health    Subjective:  No chest pain or palpitations.  Decreased shortness for breath.  No fever chills or night sweats.  No nausea vomiting.  No dizziness.  Passing flatus.  No bowel movement as of yet.  Voiding without difficulty    acetaminophen (TYLENOL) tablet, 650 mg, Oral, Q6H PRN  aluminum-magnesium hydroxide-simethicone (MAG-AL PLUS) 200-200-20 mg per 5 mL oral liquid, 20 mL, Oral, Q4H PRN  aspirin (ECOTRIN) enteric coated tablet 81 mg, 81 mg, Oral, Daily  [Held by provider] atorvastatin (LIPITOR) tablet, 40 mg, Oral, QPM  carvedilol (COREG) tablet, 3.125 mg, Oral, 2x/day-Food  enoxaparin PF (LOVENOX) 30 mg/0.3 mL SubQ injection, 30 mg, Subcutaneous, Q24H  furosemide (LASIX) 10 mg/mL injection, 20 mg, Intravenous, 2x/day  lactulose (ENULOSE) 20g per 30mL oral liquid, 30 mL, Oral, 3x/day  levothyroxine (SYNTHROID) tablet, 75 mcg, Oral, QAM  lisinopril (PRINIVIL) tablet, 2.5 mg, Oral, Daily  metoclopramide (REGLAN) tablet, 5 mg, Oral, 3x/day AC  nirmatrelvir-ritonavir (PAXLOVID) 150-100mg  tablet therapy pack - Renally Dosed Preparation, 2 Tablet, Oral, 2x/day  NS flush syringe, 3 mL, Intracatheter, Q8HRS  NS flush syringe, 3 mL, Intracatheter, Q1H PRN  ondansetron (ZOFRAN) 2 mg/mL injection, 4 mg, Intravenous, Q6H PRN  potassium chloride (KCLOR-CON) extended  release tablet, 10 mEq, Oral, 2x/day-Food  sennosides-docusate sodium (SENOKOT-S) 8.6-50mg  per tablet, 1 Tablet, Oral, 2x/day  simethicone (MYLICON) chewable tablet, 160 mg, Oral, 4x/day        Physical Exam:    Vital Signs:  BP 109/63   Pulse 92   Temp 36.4 C (97.5 F)   Resp 17   Ht 1.575 m (5\' 2" )   Wt (!) 32.7 kg (72 lb 1.5 oz)   LMP  (LMP Unknown)   SpO2 100%   BMI 13.19 kg/m         I/O:  I/O last 24 hours:    Intake/Output Summary (Last 24 hours) at 08/08/2023 1032  Last data filed at 08/08/2023 0800  Gross per 24 hour   Intake 1183 ml   Output --   Net 1183 ml     I/O current shift:  01/25 0700 - 01/25 1859  In: 240 [P.O.:240]  Out: -   Blood Sugars:     General: No acute distress, alert and oriented x3  Cardio: Regular rate and rhythm. Normal S1 and S2. No murmurs, gallops, or rubs.   Resp: Clear to auscultation bilaterally. No wheezes, rales, rhonchi or crackles. Normal resp effort.  Abd: Bowel sounds present, Soft, non-tender  Extremities: No edema or swelling noted      Labs:     Results for orders placed or performed during the hospital encounter of 08/07/23 (from the past 24 hour(s))   TROPONIN-I  Result Value Ref Range    TROPONIN-I HS <2.7 <=14.0 ng/L ng/L   TROPONIN-I   Result Value Ref Range    TROPONIN-I HS <2.7 <=14.0 ng/L ng/L   TROPONIN-I   Result Value Ref Range    TROPONIN-I HS <2.7 <=14.0 ng/L ng/L   CBC/DIFF    Narrative    The following orders were created for panel order CBC/DIFF.  Procedure                               Abnormality         Status                     ---------                               -----------         ------                     CBC WITH WUXL[[id number]]                                    Final result                 Please view results for these tests on the individual orders.   BASIC METABOLIC PANEL, NON-FASTING   Result Value Ref Range    SODIUM 138 136 - 145 mmol/L    POTASSIUM 3.2 (L) 3.5 - 5.1 mmol/L    CHLORIDE 94 (L) 96 - 111 mmol/L    CO2 TOTAL 36  (H) 23 - 31 mmol/L    ANION GAP 8 4 - 13 mmol/L    CALCIUM 8.9 8.6 - 10.3 mg/dL    GLUCOSE 84 65 - 536 mg/dL    BUN 15 8 - 25 mg/dL    CREATININE 6.44 0.34 - 1.05 mg/dL    BUN/CREA RATIO 18 6 - 22    ESTIMATED GFR - FEMALE 69 >=60 mL/min/BSA   MAGNESIUM   Result Value Ref Range    MAGNESIUM 2.3 1.8 - 2.6 mg/dL   PHOSPHORUS   Result Value Ref Range    PHOSPHORUS 3.4 2.3 - 4.0 mg/dL   PT/INR   Result Value Ref Range    INR 1.08 0.80 - 1.10    Narrative    Coumadin therapy INR range for Conventional Anticoagulation is 2.0 to 3.0 and for Intensive Anticoagulation 2.5 to 3.5.   CBC WITH DIFF   Result Value Ref Range    WBC 5.0 3.7 - 11.0 x10^3/uL    RBC 3.86 3.85 - 5.22 x10^6/uL    HGB 12.3 11.5 - 16.0 g/dL    HCT 74.2 59.5 - 63.8 %    MCV 92.7 78.0 - 100.0 fL    MCH 31.9 26.0 - 32.0 pg    MCHC 34.4 31.0 - 35.5 g/dL    RDW-CV 75.6 43.3 - 29.5 %    PLATELETS 221 150 - 400 x10^3/uL    MPV 9.2 8.7 - 12.5 fL    NEUTROPHIL % 57.7 %    LYMPHOCYTE % 30.7 %    MONOCYTE % 9.0 %    EOSINOPHIL % 1.4 %    BASOPHIL % 1.0 %    NEUTROPHIL # 2.89 1.50 - 7.70 x10^3/uL  LYMPHOCYTE # 1.54 1.00 - 4.80 x10^3/uL    MONOCYTE # 0.45 0.20 - 1.10 x10^3/uL    EOSINOPHIL # <0.10 <=0.50 x10^3/uL    BASOPHIL # <0.10 <=0.20 x10^3/uL    IMMATURE GRANULOCYTE % 0.2 0.0 - 1.0 %    IMMATURE GRANULOCYTE # <0.10 <0.10 x10^3/uL       Micro: No results found for any visits on 08/07/23 (from the past 96 hour(s)).    Radiology:    Results for orders placed or performed during the hospital encounter of 08/07/23 (from the past 24 hour(s))   XR AP MOBILE CHEST     Status: None    Narrative    Monette L Gural    PROCEDURE DESCRIPTION: XR AP MOBILE CHEST    CLINICAL INDICATION: SOB    TECHNIQUE: 1 views / 1 images submitted.    COMPARISON: 07/13/2023      FINDINGS: Portal chest shows scoliosis. Pleural effusion on the left persists. Biapical pleural thickening is again seen. Vascularity is normal. No pneumothorax is evident. Degenerative changes are present at  the shoulders.      Impression    Impression: Left pleural effusion and biapical pleural thickening. No new abnormality.                Radiologist location ID: [id number]     CT ANGIO CHEST FOR PULMONARY EMBOLUS W IV CONTRAST     Status: None    Narrative    Jolette L Lenz    PROCEDURE DESCRIPTION: CT ANGIO CHEST FOR PULMONARY EMBOLUS W IV CONTRAST    CTA chest was performed to evaluate for pulmonary embolism with 3D/MIP reconstructions generated.    CLINICAL INDICATION: shortness of breath, positive d-dimer    COMPARISON: 06/29/2023      FINDINGS: There is a small to moderate-sized left pleural effusion with associated atelectasis of the left lower lobe. Trace right pleural fluid is also seen. There is no focal consolidation or pneumothorax. The central airways are clear.    There is no pulmonary embolus. The right atrium is enlarged but otherwise the heart is within normal exercise with no pericardial effusion. There is no mediastinal or hilar adenopathy or mass.    Included portions of the abdomen demonstrate no acute abnormality. There is scoliosis of the thoracic spine with straightening of thoracic kyphosis. Compression fractures are again noted at T6, T11, T12, and L1, unchanged from the previous exam.      Impression    1. No pulmonary embolus.  2. Small to moderate left pleural effusion with associated atelectasis, slightly decreased in size from the previous exam.            The CT exam was performed using one or more of the following dose reduction techniques: Automated exposure control, adjustment of the mA and/or kV according to the patient's size, or use of iterative reconstruction technique.        Radiologist location ID: [id number]         Marylee Floras, MD    08/08/2023   10:32  [facility]  [facility]

## 2023-08-08 NOTE — Nurses Notes (Signed)
Patient is alert and oriented but forgetful, IV is patent, clean, dry, and intact. Significant deficits to vision and hard of hearing. O2 sats within normal ranges on RA, running NSR on telemetry. Bed alarm utilized. Ambulates walker with standby. Patient rested well throughout the shift. Currently this patient meets requirements for a low to mid level of nursing care. The patient will continue to be monitored and re-evaluated for any changes.    Courtney-Dee Frederik Pear, GN

## 2023-08-08 NOTE — Care Plan (Signed)
Problem: Adult Inpatient Plan of Care  Goal: Plan of Care Review  Outcome: Ongoing (see interventions/notes)  Goal: Patient-Specific Goal (Individualized)  Outcome: Ongoing (see interventions/notes)  Goal: Absence of Hospital-Acquired Illness or Injury  Outcome: Ongoing (see interventions/notes)  Intervention: Identify and Manage Fall Risk  Recent Flowsheet Documentation  Taken 08/08/2023 1400 by Constance Goltz, RN  Safety Promotion/Fall Prevention:   activity supervised   fall prevention program maintained   nonskid shoes/slippers when out of bed   safety round/check completed  Taken 08/08/2023 1200 by Constance Goltz, RN  Safety Promotion/Fall Prevention:   activity supervised   fall prevention program maintained   nonskid shoes/slippers when out of bed   safety round/check completed  Taken 08/08/2023 0945 by Constance Goltz, RN  Safety Promotion/Fall Prevention:   activity supervised   fall prevention program maintained   nonskid shoes/slippers when out of bed   safety round/check completed  Intervention: Prevent Skin Injury  Recent Flowsheet Documentation  Taken 08/08/2023 1100 by Constance Goltz, RN  Skin Protection: adhesive use limited  Taken 08/08/2023 0945 by Constance Goltz, RN  Body Position: supine, head elevated  Skin Protection: adhesive use limited  Intervention: Prevent and Manage VTE (Venous Thromboembolism) Risk  Recent Flowsheet Documentation  Taken 08/08/2023 1200 by Constance Goltz, RN  VTE Prevention/Management: anticoagulant therapy maintained  Taken 08/08/2023 0945 by Constance Goltz, RN  VTE Prevention/Management: anticoagulant therapy maintained  Intervention: Prevent Infection  Recent Flowsheet Documentation  Taken 08/08/2023 1400 by Constance Goltz, RN  Infection Prevention: barrier precautions utilized  Taken 08/08/2023 1200 by Constance Goltz, RN  Infection Prevention: barrier precautions utilized  Taken 08/08/2023 0945 by Constance Goltz, RN  Infection Prevention: barrier precautions utilized  Goal: Optimal Comfort and Wellbeing  Outcome:  Ongoing (see interventions/notes)  Intervention: Provide Person-Centered Care  Recent Flowsheet Documentation  Taken 08/08/2023 0945 by Constance Goltz, RN  Trust Relationship/Rapport:   care explained   choices provided   questions answered  Goal: Rounds/Family Conference  Outcome: Ongoing (see interventions/notes)

## 2023-08-08 NOTE — Ancillary Notes (Signed)
Medical Nutrition Therapy Assessment        SUBJECTIVE:   [name] is an 89 yo female who presents with shortness of breath. Noted acute hypoxic respiratory failure. Tested positive for COVID-19 on 08/07/23. PMH includes CHF. Familiar with pt from previous admissions. Currently on a puree regular diet. Eats a pureed diet at home. PO intake is good at this time, 75% intake during breakfast this morning. Tolerating diet. Noted 6# weight loss since 06/29/23 (78#) per weight chart which is a 7.7% body weight loss. Agreeable to Ensure supplements - supplements ordered. Labs reviewed- K decreased to 3.2. K is currently being replaced.    OBJECTIVE:     Current Diet Order/Nutrition Support:  MNT PROTOCOL FOR DIETITIAN  DIET REGULAR Consistency/thickening: Solid: PUREE  DIETARY ORAL SUPPLEMENTS Product Name: Ensure High Protein-Vanilla; AFTERNOON/EVENING SNACK; 1 Each     Height Used for Calculations: 157.5 cm (5' 2.01")  Weight Used For Calculations: 32.7 kg (72 lb 1.5 oz)  BMI (kg/m2): 13.21  BMI Assessment: less than 18.5 - underweight  Ideal Body Weight (IBW) (kg): 50.45  % Ideal Body Weight: 64.82           Wt Readings from Last 8 Encounters:   08/07/23 (!) 32.7 kg (72 lb 1.5 oz)   07/12/23 (!) 33.1 kg (72 lb 15.6 oz)   06/29/23 (!) 35.5 kg (78 lb 4.2 oz)   05/22/23 (!) 33.2 kg (73 lb 3.1 oz)   04/21/23 (!) 31.3 kg (69 lb)   04/13/23 (!) 31 kg (68 lb 5.5 oz)   04/09/23 (!) 31.8 kg (70 lb)   04/01/23 (!) 31.3 kg (69 lb 0.1 oz)      Comprehensive Metabolic Profile    Lab Results   Component Value Date/Time    SODIUM 138 08/08/2023 01:26 AM    POTASSIUM 3.2 (L) 08/08/2023 01:26 AM    CHLORIDE 94 (L) 08/08/2023 01:26 AM    CO2 36 (H) 08/08/2023 01:26 AM    ANIONGAP 8 08/08/2023 01:26 AM    BUN 15 08/08/2023 01:26 AM    CREATININE 0.83 08/08/2023 01:26 AM    GLUCOSE 84 08/08/2023 01:26 AM    Lab Results   Component Value Date/Time    CALCIUM 8.9 08/08/2023 01:26 AM    PHOSPHORUS 3.4 08/08/2023 01:26 AM    ALBUMIN  3.1 (L) 07/13/2023 04:29 AM    TOTALPROTEIN 5.3 (L) 07/13/2023 04:29 AM    ALKPHOS 50 (L) 07/13/2023 04:29 AM    AST 17 07/13/2023 04:29 AM    ALT 5 (L) 07/13/2023 04:29 AM    TOTBILIRUBIN 0.6 07/13/2023 04:29 AM         Current Facility-Administered Medications   Medication Dose Route Frequency    acetaminophen (TYLENOL) tablet  650 mg Oral Q6H PRN    aluminum-magnesium hydroxide-simethicone (MAG-AL PLUS) 200-200-20 mg per 5 mL oral liquid  20 mL Oral Q4H PRN    aspirin (ECOTRIN) enteric coated tablet 81 mg  81 mg Oral Daily    [Held by provider] atorvastatin (LIPITOR) tablet  40 mg Oral QPM    carvedilol (COREG) tablet  3.125 mg Oral 2x/day-Food    enoxaparin PF (LOVENOX) 30 mg/0.3 mL SubQ injection  30 mg Subcutaneous Q24H    furosemide (LASIX) 10 mg/mL injection  20 mg Intravenous 2x/day    lactulose (ENULOSE) 20g per 30mL oral liquid  30 mL Oral 3x/day    levothyroxine (SYNTHROID) tablet  75 mcg Oral QAM    lisinopril (PRINIVIL) tablet  2.5 mg Oral Daily    metoclopramide (REGLAN) tablet  5 mg Oral 3x/day AC    nirmatrelvir-ritonavir (PAXLOVID) 150-100mg  tablet therapy pack - Renally Dosed Preparation  2 Tablet Oral 2x/day    NS flush syringe  3 mL Intracatheter Q8HRS    NS flush syringe  3 mL Intracatheter Q1H PRN    ondansetron (ZOFRAN) 2 mg/mL injection  4 mg Intravenous Q6H PRN    potassium chloride (KCLOR-CON) extended release tablet  10 mEq Oral 2x/day-Food    sennosides-docusate sodium (SENOKOT-S) 8.6-50mg  per tablet  1 Tablet Oral 2x/day    simethicone (MYLICON) chewable tablet  160 mg Oral 4x/day      Physical Assessment:   No pressure injuries or edema noted. LBM: 1/24.    A nutrition-focused physical exam was performed.  Results:  Subcutaneous fat loss (3+): orbital 3+, cheek 3+, tricep 3+  Muscle mass loss (3+): temple 3+, clavicle 3+, shoulder 3+    Estimated Needs:  Energy Calorie Requirements: 1100-1250 kcal per day (34-38 kcals/32.7 kg)  Protein Requirements (gms/day): 46-59 g per day  (1.4-1.8g/32.7 kg)  Fluid Requirements: 1100-1250 ml per day (34-38 mLs/32.7 kg)    Plan/Interventions:   1. Puree regular diet  - Eats puree diet at home  2. Ensure HP BID ordered to maximize nutrient intake  - Adjust supplement order and frequency as needed  3. COVID nutritional recommendations: Vitamin D 50 mcg daily;  elemental zinc 50 mg QD and Vitamin C 250 mg QD  4. Recommend a daily probiotic if GI symptoms are present.  5. Monitor po intake, diet tolerance, body weight, lab values    RD available as needed.    Nutrition Diagnosis:   Unspecified severe protein-calorie malnutrition  Related to: inadequate nutrient intake   As evidenced by: Energy Intake: Less than 75% of EER in greater than or equal to 3 months, Weight Loss: greater than 7.5% in 3 months (7.7% body weight loss since 06/29/23), Body Fat Loss: Severe (Subcutaneous fat loss (3+): orbital 3+, cheek 3+, tricep 3+), Muscle Mass Loss: Severe (Muscle mass loss (3+): temple 3+, clavicle 3+, shoulder 3+), Other Factors: CHF, BMI Assessment: less than 18.5 - underweight (BMI: 13.2).      Creola Corn, MS, RD, LD  08/08/2023, 14:00  [facility] Clinical Dietitian  Phone ext [phone]  Ascom ext [phone]

## 2023-08-08 NOTE — Care Management Notes (Signed)
[facility]  Care Management Note    Patient Name: [name]  Date of Birth: 1935/06/22  Sex: female  Date/Time of Admission: 08/07/2023  9:44 AM  Room/Bed: 4130/A  Payor: MEDICARE / Plan: MEDICARE PART A AND B / Product Type: Medicare /    LOS: 1 day   PCP: Marylee Floras, MD    Admitting Diagnosis:  COVID [U07.1]       08/08/23 0952   Medicare Intent to Discharge Documentation   Admit IMM given to: Patient   Admit IMM letter given date 08/08/23   Admit IMM letter time given 0952   IMM explained/reviewed with:  Patient;verbalized understanding;discussed via phone     Pt in COVID-19 isolation,telephoned pt room.    Case Manager: Ernesta Amble, RN  Phone: [phone]

## 2023-08-08 NOTE — Nurses Notes (Signed)
Patient is alert and oriented. Patient is on room air. Patient is on tele running NSR with a continuous pulse ox. Patient has a purewick in place draining clear, yellow urine. Patient is a stand by assist to the bedside commode. Patient takes pills crushed with applesauce. Patient was seen by Dr. Melina Modena today at bedside. Patient has not complained of pain. Patient has been free from falls.    Larena Sox, RN

## 2023-08-09 ENCOUNTER — Inpatient Hospital Stay (HOSPITAL_COMMUNITY): Payer: Medicare Other

## 2023-08-09 LAB — BASIC METABOLIC PANEL
ANION GAP: 6 mmol/L (ref 4–13)
BUN/CREA RATIO: 16 (ref 6–22)
BUN: 12 mg/dL (ref 8–25)
CALCIUM: 8.5 mg/dL — ABNORMAL LOW (ref 8.6–10.3)
CHLORIDE: 94 mmol/L — ABNORMAL LOW (ref 96–111)
CO2 TOTAL: 34 mmol/L — ABNORMAL HIGH (ref 23–31)
CREATININE: 0.76 mg/dL (ref 0.60–1.05)
ESTIMATED GFR - FEMALE: 76 mL/min/BSA (ref >=60)
GLUCOSE: 82 mg/dL (ref 65–125)
POTASSIUM: 3.9 mmol/L (ref 3.5–5.1)
SODIUM: 134 mmol/L — ABNORMAL LOW (ref 136–145)

## 2023-08-09 LAB — CBC
HCT: 32.7 % — ABNORMAL LOW (ref 34.8–46.0)
HGB: 10.8 g/dL — ABNORMAL LOW (ref 11.5–16.0)
MCH: 31 pg (ref 26.0–32.0)
MCHC: 33 g/dL (ref 31.0–35.5)
MCV: 94 fL (ref 78.0–100.0)
MPV: 9.3 fL (ref 8.7–12.5)
PLATELETS: 213 10*3/uL (ref 150–400)
RBC: 3.48 10*6/uL — ABNORMAL LOW (ref 3.85–5.22)
RDW-CV: 12.5 % (ref 11.5–15.5)
WBC: 4.4 10*3/uL (ref 3.7–11.0)

## 2023-08-09 LAB — PHOSPHORUS: PHOSPHORUS: 3.3 mg/dL (ref 2.3–4.0)

## 2023-08-09 LAB — MAGNESIUM: MAGNESIUM: 2.2 mg/dL (ref 1.8–2.6)

## 2023-08-09 MED ORDER — FUROSEMIDE 10 MG/ML INJECTION SOLUTION
40.0000 mg | INTRAMUSCULAR | Status: AC
Start: 2023-08-09 — End: 2023-08-09
  Administered 2023-08-09: 40 mg via INTRAVENOUS
  Filled 2023-08-09: qty 4

## 2023-08-09 NOTE — Progress Notes (Signed)
Internal Medicine Progress Note    Names:  [name]  Date of service: 08/09/2023  Date of Admission:  08/07/2023  Hospital Day:  LOS: 2 days     Assessment/ Plan:   Active Hospital Problems    Diagnosis    Primary Problem: COVID       1. Acute COVID infection.  Continue Paxlovid.  Not demonstrating any systemic symptoms.  Did complain of increased shortness a breath today.  Check chest x-ray.    2. Acute hypoxic respiratory failure secondary to COVID.  Will require oxygen challenge prior to discharge    3. Chronic systolic congestive heart failure.  With increasing shortness a breath will check chest x-ray today and given IV dose of Lasix.    4. Severe protein calorie malnutrition.  Encourage p.o. intake.     PT/OT: Yes    Disposition Planning:   Home with home health    Subjective:  No chest pain or palpitations.  increase reported shortness for breath today No fever chills or night sweats.  No nausea vomiting.  No dizziness.  Passing flatus.  No bowel movement as of yet.  Voiding without difficulty    acetaminophen (TYLENOL) tablet, 650 mg, Oral, Q6H PRN  aluminum-magnesium hydroxide-simethicone (MAG-AL PLUS) 200-200-20 mg per 5 mL oral liquid, 20 mL, Oral, Q4H PRN  aspirin (ECOTRIN) enteric coated tablet 81 mg, 81 mg, Oral, Daily  [Held by provider] atorvastatin (LIPITOR) tablet, 40 mg, Oral, QPM  carvedilol (COREG) tablet, 3.125 mg, Oral, 2x/day-Food  enoxaparin PF (LOVENOX) 30 mg/0.3 mL SubQ injection, 30 mg, Subcutaneous, Q24H  furosemide (LASIX) 10 mg/mL injection, 20 mg, Intravenous, 2x/day  furosemide (LASIX) 10 mg/mL injection, 40 mg, Intravenous, Now  lactulose (ENULOSE) 20g per 30mL oral liquid, 30 mL, Oral, 3x/day  levothyroxine (SYNTHROID) tablet, 75 mcg, Oral, QAM  lisinopril (PRINIVIL) tablet, 2.5 mg, Oral, Daily  metoclopramide (REGLAN) tablet, 5 mg, Oral, 3x/day AC  nirmatrelvir-ritonavir (PAXLOVID) 150-100mg  tablet therapy pack - Renally Dosed Preparation, 2 Tablet, Oral, 2x/day  NS  flush syringe, 3 mL, Intracatheter, Q8HRS  NS flush syringe, 3 mL, Intracatheter, Q1H PRN  ondansetron (ZOFRAN) 2 mg/mL injection, 4 mg, Intravenous, Q6H PRN  potassium chloride (KCLOR-CON) extended release tablet, 10 mEq, Oral, 2x/day-Food  sennosides-docusate sodium (SENOKOT-S) 8.6-50mg  per tablet, 1 Tablet, Oral, 2x/day  simethicone (MYLICON) chewable tablet, 160 mg, Oral, 4x/day        Physical Exam:    Vital Signs:  BP (!) 98/55   Pulse 93   Temp 36.3 C (97.3 F)   Resp 16   Ht 1.575 m (5\' 2" )   Wt (!) 32.7 kg (72 lb 1.5 oz)   LMP  (LMP Unknown)   SpO2 97%   BMI 13.19 kg/m         I/O:  I/O last 24 hours:    Intake/Output Summary (Last 24 hours) at 08/09/2023 1106  Last data filed at 08/08/2023 1800  Gross per 24 hour   Intake 240 ml   Output --   Net 240 ml     I/O current shift:  No intake/output data recorded.  Blood Sugars:     General:  Alert.  Cooperative.  Cardio: Regular rate and rhythm. Normal S1 and S2. No murmurs, gallops, or rubs.   Resp: Clear to auscultation bilaterally. No wheezes, rales, rhonchi or crackles. Normal resp effort.  Abd: Bowel sounds present, Soft, non-tender  Extremities: No edema or swelling noted      Labs:  Results for orders placed or performed during the hospital encounter of 08/07/23 (from the past 24 hour(s))   CBC   Result Value Ref Range    WBC 4.4 3.7 - 11.0 x10^3/uL    RBC 3.48 (L) 3.85 - 5.22 x10^6/uL    HGB 10.8 (L) 11.5 - 16.0 g/dL    HCT 29.5 (L) 28.4 - 46.0 %    MCV 94.0 78.0 - 100.0 fL    MCH 31.0 26.0 - 32.0 pg    MCHC 33.0 31.0 - 35.5 g/dL    RDW-CV 13.2 44.0 - 10.2 %    PLATELETS 213 150 - 400 x10^3/uL    MPV 9.3 8.7 - 12.5 fL   BASIC METABOLIC PANEL   Result Value Ref Range    SODIUM 134 (L) 136 - 145 mmol/L    POTASSIUM 3.9 3.5 - 5.1 mmol/L    CHLORIDE 94 (L) 96 - 111 mmol/L    CO2 TOTAL 34 (H) 23 - 31 mmol/L    ANION GAP 6 4 - 13 mmol/L    CALCIUM 8.5 (L) 8.6 - 10.3 mg/dL    GLUCOSE 82 65 - 725 mg/dL    BUN 12 8 - 25 mg/dL    CREATININE 3.66 4.40 -  1.05 mg/dL    BUN/CREA RATIO 16 6 - 22    ESTIMATED GFR - FEMALE 76 >=60 mL/min/BSA   MAGNESIUM   Result Value Ref Range    MAGNESIUM 2.2 1.8 - 2.6 mg/dL   PHOSPHORUS   Result Value Ref Range    PHOSPHORUS 3.3 2.3 - 4.0 mg/dL       Micro: No results found for any visits on 08/07/23 (from the past 96 hour(s)).    Radiology:           Marylee Floras, MD    08/09/2023   10:32  [facility]  [facility] Medicine

## 2023-08-09 NOTE — Care Plan (Signed)
Patient alert and oriented for the shift. Patient had their call bell near them and remained free from falls. Vitals remained stable for the shift. Patient had no complaints of pain. Enhanced Covid Droplet precautions remained in effect. I have reviewed this patient's orders and plan of care. Currently this patient meets requirements for a low to mid level of nursing care.  [name], GN

## 2023-08-09 NOTE — Nurses Notes (Signed)
Patient is alert and oriented. Patient is very hard of hearing. Patient is on room air. Patient is on a tele running NSR with a continuous pulse ox. Patient is a pivot to the bedside commode. Patient has a purewick in place. Patient was seen by Dr. Jules Schick at bedside today. Patient has not complained of pain. Patient has been free from falls.    Larena Sox, RN

## 2023-08-09 NOTE — Care Plan (Signed)
Problem: Adult Inpatient Plan of Care  Goal: Plan of Care Review  Outcome: Ongoing (see interventions/notes)  Goal: Patient-Specific Goal (Individualized)  Outcome: Ongoing (see interventions/notes)  Goal: Absence of Hospital-Acquired Illness or Injury  Outcome: Ongoing (see interventions/notes)  Goal: Optimal Comfort and Wellbeing  Outcome: Ongoing (see interventions/notes)  Goal: Rounds/Family Conference  Outcome: Ongoing (see interventions/notes)     Problem: Health Knowledge, Opportunity to Enhance (Adult,Obstetrics,Pediatric)  Goal: Knowledgeable about Health Subject/Topic  Description: Patient will demonstrate the desired outcomes by discharge/transition of care.  Outcome: Ongoing (see interventions/notes)     Problem: Gas Exchange Impaired  Goal: Optimal Gas Exchange  Outcome: Ongoing (see interventions/notes)     Problem: COVID-19 Confirmed or Rule-Out  Description: Resident requires specific care to avoid complications secondary to COVID-19  Goal: Resident will remain free of complications due to COVID-19  Description: 1. Review and update the resident's isolation status in the Isolation activity  2. Keep the resident's door closed at all times and limit movement of the resident outside of the room to medically essential purposes. If applicable, transfer resident to a single-person room   3. Educate and reinforce infection prevention and control practices recommended by CDC  4. Frequently monitor for development of more severe symptoms   5. Use appropriate PPE when providing care for resident  6. Reinforce no visitor policy and non-essential health care personnel policy, except for certain compassionate care situations  7. If worsening of symptoms occur, alert the nearest Hospital caring for confirmed COVID-19 patients and arrange for transfer with proper precautions including placing a facemask on the resident during transfer  8. Communicate information about known or suspected case of COVID-19 to  appropraite public health personnel  9. Avoid procedures that are likely to induce coughing (e.g., sputum induction, open suctioning of airways). If required, do so in a Airborne Infection Isolation Room. The health care provider in the room should wear an N95 or higher-level respirator, eye protection, gloves, and a gown. The number of HCP present during the procedure should be limited to only those essential for resident care and procedure support. Visitors should not be present for the procedure. Clean and disinfect procedure room surfaces promptly  10. Update resident, family, and resident representatives as needed      Outcome: Ongoing (see interventions/notes)     Problem: Heart Failure  Goal: Optimal Coping  Outcome: Ongoing (see interventions/notes)  Goal: Optimal Cardiac Output and Blood Flow  Outcome: Ongoing (see interventions/notes)  Goal: Stable Heart Rate and Rhythm  Outcome: Ongoing (see interventions/notes)  Goal: Fluid and Electrolyte Balance  Outcome: Ongoing (see interventions/notes)  Goal: Optimal Functional Ability  Outcome: Ongoing (see interventions/notes)  Goal: Improved Oral Intake  Outcome: Ongoing (see interventions/notes)  Goal: Effective Oxygenation and Ventilation  Outcome: Ongoing (see interventions/notes)  Goal: Effective Breathing Pattern During Sleep  Outcome: Ongoing (see interventions/notes)     Problem: Fall Injury Risk  Goal: Absence of Fall and Fall-Related Injury  Outcome: Ongoing (see interventions/notes)     Problem: Skin Injury Risk Increased  Goal: Skin Health and Integrity  Outcome: Ongoing (see interventions/notes)

## 2023-08-10 ENCOUNTER — Encounter (HOSPITAL_COMMUNITY): Payer: Self-pay | Admitting: Internal Medicine

## 2023-08-10 LAB — BASIC METABOLIC PANEL
ANION GAP: 9 mmol/L (ref 4–13)
BUN/CREA RATIO: 16 (ref 6–22)
BUN: 12 mg/dL (ref 8–25)
CALCIUM: 8.6 mg/dL (ref 8.6–10.3)
CHLORIDE: 95 mmol/L — ABNORMAL LOW (ref 96–111)
CO2 TOTAL: 33 mmol/L — ABNORMAL HIGH (ref 23–31)
CREATININE: 0.74 mg/dL (ref 0.60–1.05)
ESTIMATED GFR - FEMALE: 79 mL/min/BSA (ref >=60)
GLUCOSE: 80 mg/dL (ref 65–125)
POTASSIUM: 4.1 mmol/L (ref 3.5–5.1)
SODIUM: 137 mmol/L (ref 136–145)

## 2023-08-10 LAB — MAGNESIUM: MAGNESIUM: 2.1 mg/dL (ref 1.8–2.6)

## 2023-08-10 NOTE — Care Plan (Signed)
Problem: Adult Inpatient Plan of Care  Goal: Plan of Care Review  Outcome: Ongoing (see interventions/notes)  Goal: Patient-Specific Goal (Individualized)  Outcome: Ongoing (see interventions/notes)  Goal: Absence of Hospital-Acquired Illness or Injury  Outcome: Ongoing (see interventions/notes)  Intervention: Identify and Manage Fall Risk  Recent Flowsheet Documentation  Taken 08/10/2023 1001 by Alfonso Ellis, RN  Safety Promotion/Fall Prevention:   activity supervised   nonskid shoes/slippers when out of bed   safety round/check completed  Intervention: Prevent Skin Injury  Recent Flowsheet Documentation  Taken 08/10/2023 1001 by Alfonso Ellis, RN  Body Position: supine, head elevated  Skin Protection:   adhesive use limited   tubing/devices free from skin contact  Intervention: Prevent and Manage VTE (Venous Thromboembolism) Risk  Recent Flowsheet Documentation  Taken 08/10/2023 1001 by Alfonso Ellis, RN  VTE Prevention/Management:   ambulation promoted   dorsiflexion/plantar flexion performed  Goal: Optimal Comfort and Wellbeing  Outcome: Ongoing (see interventions/notes)  Intervention: Provide Person-Centered Care  Recent Flowsheet Documentation  Taken 08/10/2023 1001 by Alfonso Ellis, RN  Trust Relationship/Rapport:   care explained   emotional support provided   choices provided   empathic listening provided   questions answered   questions encouraged   reassurance provided   thoughts/feelings acknowledged  Goal: Rounds/Family Conference  Outcome: Ongoing (see interventions/notes)     Problem: Health Knowledge, Opportunity to Enhance (Adult,Obstetrics,Pediatric)  Goal: Knowledgeable about Health Subject/Topic  Description: Patient will demonstrate the desired outcomes by discharge/transition of care.  Outcome: Ongoing (see interventions/notes)  Intervention: Enhance Health Knowledge  Recent Flowsheet Documentation  Taken 08/10/2023 1001 by Alfonso Ellis, RN  Family/Support System Care: self-care encouraged  Supportive Measures:   active  listening utilized   self-care encouraged     Problem: Gas Exchange Impaired  Goal: Optimal Gas Exchange  Outcome: Ongoing (see interventions/notes)  Intervention: Optimize Oxygenation and Ventilation  Recent Flowsheet Documentation  Taken 08/10/2023 1001 by Alfonso Ellis, RN  Head of Bed [facility]) Positioning: HOB elevated     Problem: COVID-19 Confirmed or Rule-Out  Description: Resident requires specific care to avoid complications secondary to COVID-19  Goal: Resident will remain free of complications due to COVID-19  Description: 1. Review and update the resident's isolation status in the Isolation activity  2. Keep the resident's door closed at all times and limit movement of the resident outside of the room to medically essential purposes. If applicable, transfer resident to a single-person room   3. Educate and reinforce infection prevention and control practices recommended by CDC  4. Frequently monitor for development of more severe symptoms   5. Use appropriate PPE when providing care for resident  6. Reinforce no visitor policy and non-essential health care personnel policy, except for certain compassionate care situations  7. If worsening of symptoms occur, alert the nearest Hospital caring for confirmed COVID-19 patients and arrange for transfer with proper precautions including placing a facemask on the resident during transfer  8. Communicate information about known or suspected case of COVID-19 to appropraite public health personnel  9. Avoid procedures that are likely to induce coughing (e.g., sputum induction, open suctioning of airways). If required, do so in a Airborne Infection Isolation Room. The health care provider in the room should wear an N95 or higher-level respirator, eye protection, gloves, and a gown. The number of HCP present during the procedure should be limited to only those essential for resident care and procedure support. Visitors should not be present for the procedure. Clean and  disinfect procedure room  surfaces promptly  10. Update resident, family, and resident representatives as needed      Outcome: Ongoing (see interventions/notes)     Problem: Heart Failure  Goal: Optimal Coping  Outcome: Ongoing (see interventions/notes)  Intervention: Support Psychosocial Response  Recent Flowsheet Documentation  Taken 08/10/2023 1001 by Alfonso Ellis, RN  Family/Support System Care: self-care encouraged  Supportive Measures:   active listening utilized   self-care encouraged  Goal: Optimal Cardiac Output and Blood Flow  Outcome: Ongoing (see interventions/notes)  Intervention: Optimize Cardiac Output  Recent Flowsheet Documentation  Taken 08/10/2023 1001 by Alfonso Ellis, RN  Environmental Support: rest periods encouraged  Goal: Stable Heart Rate and Rhythm  Outcome: Ongoing (see interventions/notes)  Goal: Fluid and Electrolyte Balance  Outcome: Ongoing (see interventions/notes)  Intervention: Monitor and Manage Fluid and Electrolyte Balance  Recent Flowsheet Documentation  Taken 08/10/2023 1001 by Alfonso Ellis, RN  Fluid/Electrolyte Management: fluids provided  Goal: Optimal Functional Ability  Outcome: Ongoing (see interventions/notes)  Intervention: Optimize Functional Ability  Recent Flowsheet Documentation  Taken 08/10/2023 1001 by Alfonso Ellis, RN  Self-Care Promotion:   BADL personal objects within reach   independence encouraged  Activity Management: ROM, active encouraged  Goal: Improved Oral Intake  Outcome: Ongoing (see interventions/notes)  Intervention: Promote and Optimize Nutrition Intake  Recent Flowsheet Documentation  Taken 08/10/2023 1001 by Alfonso Ellis, RN  Oral Nutrition Promotion: rest periods promoted  Goal: Effective Oxygenation and Ventilation  Outcome: Ongoing (see interventions/notes)  Intervention: Promote Airway Secretion Clearance  Recent Flowsheet Documentation  Taken 08/10/2023 1001 by Alfonso Ellis, RN  Activity Management: ROM, active encouraged  Intervention: Optimize Oxygenation and  Ventilation  Recent Flowsheet Documentation  Taken 08/10/2023 1001 by Alfonso Ellis, RN  Head of Bed [facility]) Positioning: HOB elevated  Goal: Effective Breathing Pattern During Sleep  Outcome: Ongoing (see interventions/notes)     Problem: Fall Injury Risk  Goal: Absence of Fall and Fall-Related Injury  Outcome: Ongoing (see interventions/notes)  Intervention: Identify and Manage Contributors  Recent Flowsheet Documentation  Taken 08/10/2023 1001 by Alfonso Ellis, RN  Self-Care Promotion:   BADL personal objects within reach   independence encouraged  Intervention: Promote Injury-Free Environment  Recent Flowsheet Documentation  Taken 08/10/2023 1001 by Alfonso Ellis, RN  Safety Promotion/Fall Prevention:   activity supervised   nonskid shoes/slippers when out of bed   safety round/check completed     Problem: Skin Injury Risk Increased  Goal: Skin Health and Integrity  Outcome: Ongoing (see interventions/notes)  Intervention: Optimize Skin Protection  Recent Flowsheet Documentation  Taken 08/10/2023 1001 by Alfonso Ellis, RN  Pressure Reduction Techniques: Moisture, shear and nutrition are maximized  Pressure Reduction Devices: Repositioning wedges/pillows utilized  Skin Protection:   adhesive use limited   tubing/devices free from skin contact  Activity Management: ROM, active encouraged  Head of Bed (HOB) Positioning: HOB elevated  Intervention: Promote and Optimize Oral Intake  Recent Flowsheet Documentation  Taken 08/10/2023 1001 by Alfonso Ellis, RN  Oral Nutrition Promotion: rest periods promoted

## 2023-08-10 NOTE — Progress Notes (Addendum)
Internal Medicine Progress Note    Names:  [name]  Date of service: 08/10/2023  Date of Admission:  08/07/2023  Hospital Day:  LOS: 3 days     Assessment/ Plan:   Active Hospital Problems    Diagnosis    Primary Problem: COVID       1. Acute COVID 19 infection.  Continue Paxlovid.  Not demonstrating any systemic symptoms.  Did complain of increased shortness a breath today.  Check chest x-ray.    2. Acute hypoxic respiratory failure secondary to COVID.  Confirmed  Will require oxygen challenge prior to discharge    3. Chronic systolic congestive heart failure.  With increasing shortness a breath will check chest x-ray today and given IV dose of Lasix.    4. Severe protein calorie malnutrition.  Encourage p.o. intake.     PT/OT: Yes    Disposition Planning:   Home with home health    Subjective:  No chest pain or palpitations.  Less shortness breath reported today.  Bowel movement day before yesterday.  Voiding.  Overall feels better.    acetaminophen (TYLENOL) tablet, 650 mg, Oral, Q6H PRN  aluminum-magnesium hydroxide-simethicone (MAG-AL PLUS) 200-200-20 mg per 5 mL oral liquid, 20 mL, Oral, Q4H PRN  aspirin (ECOTRIN) enteric coated tablet 81 mg, 81 mg, Oral, Daily  [Held by provider] atorvastatin (LIPITOR) tablet, 40 mg, Oral, QPM  carvedilol (COREG) tablet, 3.125 mg, Oral, 2x/day-Food  enoxaparin PF (LOVENOX) 30 mg/0.3 mL SubQ injection, 30 mg, Subcutaneous, Q24H  furosemide (LASIX) 10 mg/mL injection, 20 mg, Intravenous, 2x/day  lactulose (ENULOSE) 20g per 30mL oral liquid, 30 mL, Oral, 3x/day  levothyroxine (SYNTHROID) tablet, 75 mcg, Oral, QAM  lisinopril (PRINIVIL) tablet, 2.5 mg, Oral, Daily  metoclopramide (REGLAN) tablet, 5 mg, Oral, 3x/day AC  nirmatrelvir-ritonavir (PAXLOVID) 150-100mg  tablet therapy pack - Renally Dosed Preparation, 2 Tablet, Oral, 2x/day  NS flush syringe, 3 mL, Intracatheter, Q8HRS  NS flush syringe, 3 mL, Intracatheter, Q1H PRN  ondansetron (ZOFRAN) 2 mg/mL injection, 4  mg, Intravenous, Q6H PRN  potassium chloride (KCLOR-CON) extended release tablet, 10 mEq, Oral, 2x/day-Food  sennosides-docusate sodium (SENOKOT-S) 8.6-50mg  per tablet, 1 Tablet, Oral, 2x/day  simethicone (MYLICON) chewable tablet, 160 mg, Oral, 4x/day        Physical Exam:    Vital Signs:  BP 103/60   Pulse 78   Temp 36.3 C (97.4 F)   Resp 18   Ht 1.575 m (5\' 2" )   Wt (!) 32.7 kg (72 lb 1.5 oz)   LMP  (LMP Unknown)   SpO2 100%   BMI 13.19 kg/m         I/O:  I/O last 24 hours:    Intake/Output Summary (Last 24 hours) at 08/10/2023 1308  Last data filed at 08/09/2023 1800  Gross per 24 hour   Intake 60 ml   Output --   Net 60 ml     I/O current shift:  No intake/output data recorded.  Blood Sugars:     General:  Resting comfortably in bed  Cardio: Regular rate and rhythm. Normal S1 and S2. No murmurs, gallops, or rubs.   Resp: Clear to auscultation bilaterally. No wheezes, rales, rhonchi or crackles. Normal resp effort.  Abd: Bowel sounds present, Soft, non-tender  Extremities: No edema or swelling noted.  No calf pain      Labs:     Results for orders placed or performed during the hospital encounter of 08/07/23 (from the past 24 hour(s))  BASIC METABOLIC PANEL   Result Value Ref Range    SODIUM 137 136 - 145 mmol/L    POTASSIUM 4.1 3.5 - 5.1 mmol/L    CHLORIDE 95 (L) 96 - 111 mmol/L    CO2 TOTAL 33 (H) 23 - 31 mmol/L    ANION GAP 9 4 - 13 mmol/L    CALCIUM 8.6 8.6 - 10.3 mg/dL    GLUCOSE 80 65 - 161 mg/dL    BUN 12 8 - 25 mg/dL    CREATININE 0.96 0.45 - 1.05 mg/dL    BUN/CREA RATIO 16 6 - 22    ESTIMATED GFR - FEMALE 79 >=60 mL/min/BSA   MAGNESIUM   Result Value Ref Range    MAGNESIUM 2.1 1.8 - 2.6 mg/dL       Micro: No results found for any visits on 08/07/23 (from the past 96 hour(s)).    Radiology:           Marylee Floras, MD    08/10/2023   10:32  [facility]  [facility] Medicine

## 2023-08-10 NOTE — Care Plan (Signed)
Patient alert and oriented for the shift. Patient had their call bell near them and remained free from falls. Vitals remained stable for the shift. Patient had no complaints of pain. Enhanced Covid Droplet precautions remained in effect. I have reviewed this patient's orders and plan of care. Currently this patient meets requirements for a low to mid level of nursing care.  [name], GN

## 2023-08-10 NOTE — Care Management Notes (Signed)
[facility]  Care Management Initial Evaluation    Patient Name: [name]  Date of Birth: [birth year]  Sex: female  Date/Time of Admission: 08/07/2023  9:44 AM  Room/Bed: 4130/A  Payor: MEDICARE / Plan: MEDICARE PART A AND B / Product Type: Medicare /   PCP: Marylee Floras, MD    Pharmacy Info:   Preferred Pharmacy       [facility] [address] [place] - [address]    [address] [facility] [place] [address]    Phone: [phone] Fax: [phone]    Hours: Not open 24 hours    [facility]    [address] [place] [address]    Phone: [phone] Fax: [phone]    Hours: Monday-Friday 7AM-7PM, Saturday 10AM-3PM, Closed Sunday          Emergency Contact Info:   Extended Emergency Contact Information  Primary Emergency Contact: vincent,donna   [place]  Home Phone: [phone]  Mobile Phone: [phone]  Relation: None    History:   [name] is a 89 y.o., female, admitted COVID    Height/Weight: 157.5 cm (5\' 2") / (!) 32.7 kg (72 lb 1.5 oz)     LOS: 3 days   Admitting Diagnosis: COVID [U07.1]    Assessment:      01 /27/25 1152   Assessment Details   Assessment Type Admission   Date of Care Management Update 08/10/23   Readmission   Is this a readmission? Yes   Is this a scheduled readmission? No   Insurance Information/Type   Insurance type Medicare   Employment/Financial   Patient has Prescription Coverage?  Yes        Name of Insurance Coverage for Medications N/A   Financial/Environmental Concerns none   Living Environment   Lives With child(ren), adult   Living Arrangements house   Able to Return to Prior Arrangements yes   Home Safety   Home Assessment: No Problems Identified   Home Accessibility no concerns;grab bars present (bathtub);bed and bath on same level;grab bars present (toilet)   Care Management Plan   Discharge Planning Status plan in progress   Projected Discharge Date 08/12/23   Discharge plan discussed with: Patient   CM will evaluate  for rehabilitation potential no   Patient choice offered to patient/family no   Form for patient choice reviewed/signed and on chart no   Discharge Needs Assessment   Equipment Currently Used at Home grab bar;walker, rolling;shower chair   Equipment Needed After Discharge none   Discharge Facility/Level of Care Needs Home (Patient/Family Member/other)(code 1)   Transportation Available family or friend will provide   Referral Information   Admission Type inpatient   Address Verified verified-no changes   Arrived From home or self-care   ADVANCE DIRECTIVES   Does the Patient have an Advance Directive? Yes, Patient Does Have Advance Directive for Healthcare Treatment   Document the Substance of the Advance Directive (Required) MPOA   Type of Advance Directive Completed Medical Power of Attorney   Copy of Advance Directives in Chart? Yes, Copy on Chart.(Specify in Comment Which Advance Directive)     Discharge Plan:  Home (Patient/Family Member/other) (code 1)  MPOA confirmed address, phone number, emergency contact(s), PCP, and pharmacy on file. Patient can afford medications. Patient lives with her daughter in a single level home. Patient independent with ADL's using her rollator, also has grab bars present in bathroom and a shower chair. No current  home health, not interested in at this time as patient has been getting around well and has no skilled nursing needs. Utilities on and working. Reports no unprescribed drug use or alcohol intake. Patient has good support at home and is typically never alone. Patient and family plan for patient to be DC home with daughter. Daughter to transport home at time of DC.     The patient will continue to be evaluated for developing discharge needs.     Case Manager: Shelbie Hutching, SOCIAL WORKER  Phone: [phone]

## 2023-08-10 NOTE — Nurses Notes (Signed)
Patient is alert and oriented. Free from falls. IV patent. Bed alarm utilized. Takes medications crushed in applesauce. No other complaints or concerns voiced at this time. Lonia Chimera, RN

## 2023-08-11 DIAGNOSIS — I5023 Acute on chronic systolic (congestive) heart failure: Secondary | ICD-10-CM

## 2023-08-11 LAB — CBC
HCT: 32.1 % — ABNORMAL LOW (ref 34.8–46.0)
HGB: 10.8 g/dL — ABNORMAL LOW (ref 11.5–16.0)
MCH: 31.8 pg (ref 26.0–32.0)
MCHC: 33.6 g/dL (ref 31.0–35.5)
MCV: 94.4 fL (ref 78.0–100.0)
MPV: 9.8 fL (ref 8.7–12.5)
PLATELETS: 227 10*3/uL (ref 150–400)
RBC: 3.4 10*6/uL — ABNORMAL LOW (ref 3.85–5.22)
RDW-CV: 12.7 % (ref 11.5–15.5)
WBC: 4.6 10*3/uL (ref 3.7–11.0)

## 2023-08-11 LAB — BASIC METABOLIC PANEL
ANION GAP: 7 mmol/L (ref 4–13)
BUN/CREA RATIO: 17 (ref 6–22)
BUN: 14 mg/dL (ref 8–25)
CALCIUM: 8.7 mg/dL (ref 8.6–10.3)
CHLORIDE: 94 mmol/L — ABNORMAL LOW (ref 96–111)
CO2 TOTAL: 32 mmol/L — ABNORMAL HIGH (ref 23–31)
CREATININE: 0.83 mg/dL (ref 0.60–1.05)
ESTIMATED GFR - FEMALE: 69 mL/min/BSA (ref >=60)
GLUCOSE: 82 mg/dL (ref 65–125)
POTASSIUM: 4.4 mmol/L (ref 3.5–5.1)
SODIUM: 133 mmol/L — ABNORMAL LOW (ref 136–145)

## 2023-08-11 LAB — PHOSPHORUS: PHOSPHORUS: 3 mg/dL (ref 2.3–4.0)

## 2023-08-11 LAB — MAGNESIUM: MAGNESIUM: 2.1 mg/dL (ref 1.8–2.6)

## 2023-08-11 NOTE — Care Plan (Signed)
Problem: Adult Inpatient Plan of Care  Goal: Plan of Care Review  Outcome: Ongoing (see interventions/notes)  Goal: Patient-Specific Goal (Individualized)  Outcome: Ongoing (see interventions/notes)  Goal: Absence of Hospital-Acquired Illness or Injury  Outcome: Ongoing (see interventions/notes)  Intervention: Identify and Manage Fall Risk  Recent Flowsheet Documentation  Taken 08/11/2023 0400 by Estil Daft, GN  Safety Promotion/Fall Prevention:   activity supervised   fall prevention program maintained   safety round/check completed   nonskid shoes/slippers when out of bed  Taken 08/11/2023 0200 by Estil Daft, GN  Safety Promotion/Fall Prevention:   activity supervised   fall prevention program maintained   safety round/check completed   nonskid shoes/slippers when out of bed  Taken 08/11/2023 0000 by Estil Daft, GN  Safety Promotion/Fall Prevention:   activity supervised   fall prevention program maintained   safety round/check completed   nonskid shoes/slippers when out of bed  Taken 08/10/2023 2200 by Estil Daft, GN  Safety Promotion/Fall Prevention:   activity supervised   fall prevention program maintained   safety round/check completed   nonskid shoes/slippers when out of bed  Taken 08/10/2023 2000 by Estil Daft, GN  Safety Promotion/Fall Prevention:   activity supervised   fall prevention program maintained   safety round/check completed   nonskid shoes/slippers when out of bed  Intervention: Prevent Skin Injury  Recent Flowsheet Documentation  Taken 08/10/2023 2000 by Estil Daft, GN  Body Position: supine, head elevated  Skin Protection:   adhesive use limited   transparent dressing maintained  Intervention: Prevent and Manage VTE (Venous Thromboembolism) Risk  Recent Flowsheet Documentation  Taken 08/10/2023 2000 by Estil Daft, GN  VTE Prevention/Management: anticoagulant therapy maintained  Intervention: Prevent Infection  Recent Flowsheet Documentation  Taken 08/10/2023 2000 by Estil Daft, GN  Infection Prevention:   barrier  precautions utilized   rest/sleep promoted   promote handwashing   single patient room provided  Goal: Optimal Comfort and Wellbeing  Outcome: Ongoing (see interventions/notes)  Intervention: Provide Person-Centered Care  Recent Flowsheet Documentation  Taken 08/10/2023 2000 by Estil Daft, GN  Trust Relationship/Rapport:   care explained   empathic listening provided   reassurance provided   thoughts/feelings acknowledged  Goal: Rounds/Family Conference  Outcome: Ongoing (see interventions/notes)     Problem: Health Knowledge, Opportunity to Enhance (Adult,Obstetrics,Pediatric)  Goal: Knowledgeable about Health Subject/Topic  Description: Patient will demonstrate the desired outcomes by discharge/transition of care.  Outcome: Ongoing (see interventions/notes)  Intervention: Enhance Health Knowledge  Recent Flowsheet Documentation  Taken 08/10/2023 2000 by Estil Daft, GN  Family/Support System Care:   self-care encouraged   presence promoted  Supportive Measures:   active listening utilized   self-care encouraged     Problem: Gas Exchange Impaired  Goal: Optimal Gas Exchange  Outcome: Ongoing (see interventions/notes)  Intervention: Optimize Oxygenation and Ventilation  Recent Flowsheet Documentation  Taken 08/10/2023 2000 by Estil Daft, GN  Head of Bed (HOB) Positioning: HOB elevated     Problem: COVID-19 Confirmed or Rule-Out  Description: Resident requires specific care to avoid complications secondary to COVID-19  Goal: Resident will remain free of complications due to COVID-19  Description: 1. Review and update the resident's isolation status in the Isolation activity  2. Keep the resident's door closed at all times and limit movement of the resident outside of the room to medically essential purposes. If applicable, transfer resident to a single-person room   3. Educate and reinforce infection prevention and control practices recommended by CDC  4. Frequently monitor  for development of more severe symptoms   5. Use  appropriate PPE when providing care for resident  6. Reinforce no visitor policy and non-essential health care personnel policy, except for certain compassionate care situations  7. If worsening of symptoms occur, alert the nearest Hospital caring for confirmed COVID-19 patients and arrange for transfer with proper precautions including placing a facemask on the resident during transfer  8. Communicate information about known or suspected case of COVID-19 to appropraite public health personnel  9. Avoid procedures that are likely to induce coughing (e.g., sputum induction, open suctioning of airways). If required, do so in a Airborne Infection Isolation Room. The health care provider in the room should wear an N95 or higher-level respirator, eye protection, gloves, and a gown. The number of HCP present during the procedure should be limited to only those essential for resident care and procedure support. Visitors should not be present for the procedure. Clean and disinfect procedure room surfaces promptly  10. Update resident, family, and resident representatives as needed      Outcome: Ongoing (see interventions/notes)     Problem: Heart Failure  Goal: Optimal Coping  Outcome: Ongoing (see interventions/notes)  Intervention: Support Psychosocial Response  Recent Flowsheet Documentation  Taken 08/10/2023 2000 by Estil Daft, GN  Family/Support System Care:   self-care encouraged   presence promoted  Supportive Measures:   active listening utilized   self-care encouraged  Goal: Optimal Cardiac Output and Blood Flow  Outcome: Ongoing (see interventions/notes)  Intervention: Optimize Cardiac Output  Recent Flowsheet Documentation  Taken 08/10/2023 2000 by Estil Daft, GN  Environmental Support:   calm environment promoted   rest periods encouraged  Goal: Stable Heart Rate and Rhythm  Outcome: Ongoing (see interventions/notes)  Goal: Fluid and Electrolyte Balance  Outcome: Ongoing (see interventions/notes)  Intervention: Monitor  and Manage Fluid and Electrolyte Balance  Recent Flowsheet Documentation  Taken 08/10/2023 2000 by Estil Daft, GN  Fluid/Electrolyte Management: fluids provided  Goal: Optimal Functional Ability  Outcome: Ongoing (see interventions/notes)  Intervention: Optimize Functional Ability  Recent Flowsheet Documentation  Taken 08/10/2023 2000 by Estil Daft, GN  Self-Care Promotion:   independence encouraged   BADL personal objects within reach  Activity Management: up ad lib  Goal: Improved Oral Intake  Outcome: Ongoing (see interventions/notes)  Intervention: Promote and Optimize Nutrition Intake  Recent Flowsheet Documentation  Taken 08/10/2023 2000 by Estil Daft, GN  Oral Nutrition Promotion: rest periods promoted  Goal: Effective Oxygenation and Ventilation  Outcome: Ongoing (see interventions/notes)  Intervention: Promote Airway Secretion Clearance  Recent Flowsheet Documentation  Taken 08/10/2023 2000 by Estil Daft, GN  Activity Management: up ad lib  Intervention: Optimize Oxygenation and Ventilation  Recent Flowsheet Documentation  Taken 08/10/2023 2000 by Estil Daft, GN  Head of Bed (HOB) Positioning: HOB elevated  Goal: Effective Breathing Pattern During Sleep  Outcome: Ongoing (see interventions/notes)     Problem: Fall Injury Risk  Goal: Absence of Fall and Fall-Related Injury  Outcome: Ongoing (see interventions/notes)  Intervention: Identify and Manage Contributors  Recent Flowsheet Documentation  Taken 08/10/2023 2000 by Estil Daft, GN  Self-Care Promotion:   independence encouraged   BADL personal objects within reach  Intervention: Promote Injury-Free Environment  Recent Flowsheet Documentation  Taken 08/11/2023 0400 by Estil Daft, GN  Safety Promotion/Fall Prevention:   activity supervised   fall prevention program maintained   safety round/check completed   nonskid shoes/slippers when out of bed  Taken 08/11/2023 0200 by Estil Daft, GN  Safety Promotion/Fall  Prevention:   activity supervised   fall prevention program maintained   safety  round/check completed   nonskid shoes/slippers when out of bed  Taken 08/11/2023 0000 by Estil Daft, GN  Safety Promotion/Fall Prevention:   activity supervised   fall prevention program maintained   safety round/check completed   nonskid shoes/slippers when out of bed  Taken 08/10/2023 2200 by Estil Daft, GN  Safety Promotion/Fall Prevention:   activity supervised   fall prevention program maintained   safety round/check completed   nonskid shoes/slippers when out of bed  Taken 08/10/2023 2000 by Estil Daft, GN  Safety Promotion/Fall Prevention:   activity supervised   fall prevention program maintained   safety round/check completed   nonskid shoes/slippers when out of bed     Problem: Skin Injury Risk Increased  Goal: Skin Health and Integrity  Outcome: Ongoing (see interventions/notes)  Intervention: Optimize Skin Protection  Recent Flowsheet Documentation  Taken 08/10/2023 2000 by Estil Daft, GN  Pressure Reduction Techniques: Frequent weight shifting encouraged  Pressure Reduction Devices: Pressure reduction chair cushion utilized  Skin Protection:   adhesive use limited   transparent dressing maintained  Activity Management: up ad lib  Head of Bed (HOB) Positioning: HOB elevated  Intervention: Promote and Optimize Oral Intake  Recent Flowsheet Documentation  Taken 08/10/2023 2000 by Estil Daft, GN  Oral Nutrition Promotion: rest periods promoted

## 2023-08-11 NOTE — Nurses Notes (Signed)
She is alert and oriented. Vitals stable. She has been weaned to room air. She is ambulatory with a walker and standby assistance. She has remained free from falls. She takes her medications crushed in applesauce. She has been sinus rhythm on telemetry.  Plan of care discussed and questions answered.  Danella Deis, LPN

## 2023-08-11 NOTE — Care Plan (Signed)
Patient alert and oriented for the shift. Patient had their call bell near them and remained free from falls. Vitals remained stable for the shift. Patient had no complaints of pain. Enhanced Covid Droplet precautions remained in effect. I have reviewed this patient's orders and plan of care. Currently this patient meets requirements for a low to mid level of nursing care.  [name], GN

## 2023-08-11 NOTE — Care Plan (Signed)
[facility]  Rehabilitation Services  Physical Therapy Initial Evaluation    Patient Name: [name]  Date of Birth: Apr 30, 1935  Height: Height: 157.5 cm (5\' 2" )  Weight: Weight: (!) 32.7 kg (72 lb 1.5 oz)  Room/Bed: 4130/A  Payor: MEDICARE / Plan: MEDICARE PART A AND B / Product Type: Medicare /     Assessment:      Pt demonstrated deficits with endurance, balance and mobility. Pt ambulated 31ft using FWW. Pt completed bed mobility, transfers and pivots on her own. Pt reports good family support at home. Educated on SNF vs HH. Pt reports feeling safe to return home. Recommend HH    Discharge Needs:    Equipment Recommendation: front wheeled walker        Discharge Disposition: home with home health    JUSTIFICATION OF DISCHARGE RECOMMENDATION   Based on current diagnosis, functional performance prior to admission, and current functional performance, this patient requires continued PT services in home with home health in order to achieve significant functional improvements in these deficit areas: aerobic capacity/endurance, gait, locomotion, and balance.        Plan:   Current Intervention: home exercise program, gait training, bed mobility training, balance training, neuromuscular re-education, postural re-education, patient/family education, stretching, stair training, ROM (range of motion), strengthening, transfer training  To provide physical therapy services minimum of 3x/week  for duration of until goals are met.    The risks/benefits of therapy have been discussed with the patient/caregiver and he/she is in agreement with the established plan of care.       Subjective & Objective        08/11/23 1151   Therapist Pager   PT Assigned/ Pager # [phone]   Rehab Session   Document Type evaluation   PT Visit Date 08/11/23   Total PT Minutes: 24  [phone])   Patient Effort good   Symptoms Noted During/After Treatment fatigue   General Information   Patient Profile Reviewed yes   Onset of Illness/Injury  or Date of Surgery 08/07/23   Patient/Family/Caregiver Comments/Observations Pt agreeable to PT. Pt required encouragement for   Pertinent History of Current Functional Problem Pt admitted due to COVID Pt is covid +. PMHx; fibro, CHF, EF 20%   Medical Lines PIV Line   Respiratory Status nasal cannula   Existing Precautions/Restrictions fall precautions   Mutuality/Individual Preferences   Individualized Care Needs FWW/assist x 1   Living Environment   Lives With child(ren), adult  (son)   Living Arrangements house   Home Assessment: Stairs in Home   Home Accessibility stairs to enter home   Home Main Entrance   Number of Stairs, Main Entrance one   Functional Level Prior   Ambulation 1 - assistive equipment  (rollator)   Transferring 0 - independent   Toileting 0 - independent   Bathing 2 - assistive person   Dressing 2 - assistive person   Eating 0 - independent   Communication 0 - understands/communicates without difficulty   Prior Functional Level Comment She report using rollator for mobility. Pt reports granddaughter assists with bathing. Pt is on room air.   Self-Care   Usual Activity Tolerance moderate   Current Activity Tolerance fair   Equipment Currently Used at Home yes   Equipment Currently Used at Home walker, rolling   Pre Treatment Status   Pre Treatment Patient Status Patient supine in bed   Support Present Pre Treatment  None   Cognition   Behavior/Mood  Observations behavior appropriate to situation, WNL/WFL   Orientation Status oriented x 4   Attention WNL/WFL   Follows Commands follows one step commands   Comment Pt is HOH; one step commands   Vital Signs   Post-treatment Heart Rate (beats/min) 86   O2 Delivery Post Treatment supplemental O2   Vitals Comment Attempted to get read for oxygen, however unable toread on finger druing sessin   Pain Assessment   Pretreatment Pain Rating 0/10 - no pain   Posttreatment Pain Rating 0/10 - no pain   RUE Assessment   RUE Assessment WFL for stated baseline    LUE Assessment   LUE Assessment WFL for stated baseline   RLE Assessment   RLE Assessment WFL for stated baseline   LLE Assessment   LLE Assessment WFL for stated baseline   Bed Mobility   Bed Mobility, Assistive Device Head of Bed Elevated   Supine-Sit Independence stand-by assistance   Sit to Supine, Independence not tested   Safety Issues decreased use of arms for pushing/pulling;decreased use of legs for bridging/pushing;impaired trunk control for bed mobility   Impairments balance impaired;endurance;strength decreased   Comment Limited by fatigue;able to complete. Increased time   Transfer Assessment/Treatment   Sit-Stand Independence stand-by assistance   Stand-Sit Independence stand-by assistance   Sit-Stand-Sit, Assist Device walker, front wheeled   Bed-Chair Independence stand-by assistance   Bed-Chair-Bed Assist Device handheld assist   Transfer Impairments balance impaired;endurance;strength decreased   Transfer Comment mild unsteady with standing. Cues for orietnation.   Gait Assessment/Treatment   Independence  contact guard assist;stand-by assistance   Assistive Device  walker, front wheeled   Distance in Feet 9ftx1   Deviations  cadence decreased;step length decreased;stride length decreased   Impairments  balance impaired;endurance;strength decreased   Comment Pt ambulated in room due due to Covid+. Pt compelted turns without issues. Pt noted fatigue and required break. Pt had no buckling. Demonstrated step through gait.   Balance   Sitting Balance: Static fair + balance   Sitting, Dynamic (Balance) fair + balance   Sit-to-Stand Balance fair balance   Standing Balance: Static fair balance   Standing Balance: Dynamic fair balance   Post Treatment Status   Post Treatment Patient Status Patient sitting in bedside chair or w/c;Call light within reach;Telephone within reach;Patient safety alarm activated   Support Present Post Treatment  None   Plan of Care Review   Plan Of Care Reviewed With patient    Physical Therapy Clinical Impression   Assessment Pt demonstrated deficits with endurance, balance and mobility. Pt ambulated 52ft using FWW. Pt completed bed mobility, transfers and pivots on her own. Pt reports good family support at home. Educated on SNF vs HH. Pt reports feeling safe to return home. Recommend HH   Criteria for Skilled Therapeutic yes;meets criteria   Pathology/Pathophysiology Noted musculoskeletal;neuromuscular;cardiovascular;pulmonary   Impairments Found (describe specific impairments) aerobic capacity/endurance;gait, locomotion, and balance   Functional Limitations in Following  home management;self-care   Rehab Potential good   Therapy Frequency minimum of 3x/week   Predicted Duration of Therapy Intervention (days/wks) until goals are met   Anticipated Equipment Needs at Discharge (PT) front wheeled walker   Anticipated Discharge Disposition home with home health   Evaluation Complexity Justification   Patient History: Co-morbidity/factors that impact Plan of Care 3 or more that impact Plan of Care   Examination Components 3 or more Exam elements addressed   Presentation Stable: Uncomplicated, straight-forward, problem focused   Clinical Decision Making Moderate complexity  Evaluation Complexity Moderate complexity   Care Plan Goals   PT Rehab Goals Bed Mobility Goal;Gait Training Goal;Stairs Training Goal;Transfer Training Goal   Bed Mobility Goal   Bed Mobility Goal, Date Established 08/11/23   Bed Mobility Goal, Time to Achieve 12 wks   Bed Mobility Goal, Activity Type all bed mobility activities   Bed Mobility Goal, Independence Level modified independence   Gait Training  Goal, Distance to Achieve   Gait Training  Goal, Date Established 08/11/23   Gait Training  Goal, Time to Achieve 30 days   Gait Training  Goal, Independence Level modified independence   Gait Training  Goal, Assist Device least restricted assistive device   Gait Training  Goal, Distance to Achieve 150   Stairs  Training Goal   Stairs Training Goal, Date Established 08/11/23   Stairs Training Goal, Time to Achieve 30 days   Stairs Training Goal, Independence Level modified independence   Stairs Training Goal, Assist Device least restrictive assistive device   Stairs Training Goal, Number of Stairs to Achieve 2   Transfer Training Goal   Transfer Training Goal, Date Established 08/11/23   Transfer Training Goal, Time to Achieve 12 wks   Transfer Training Goal, Activity Type all transfers   Transfer Training Goal, Independence Level modified independence   Transfer Training Goal, Assist Device least restrictive assistive device   Planned Therapy Interventions, PT Eval   Planned Therapy Interventions (PT) home exercise program;gait training;bed mobility training;balance training;neuromuscular re-education;postural re-education;patient/family education;stretching;stair training;ROM (range of motion);strengthening;transfer training       Therapist:   Mora Bellman, PT 08/11/2023 12:10  Pager #: [phone]

## 2023-08-11 NOTE — Progress Notes (Signed)
Internal Medicine Progress Note    Names:  [name]  Date of service: 08/11/2023  Date of Admission:  08/07/2023  Hospital Day:  LOS: 4 days     Assessment/ Plan:   Active Hospital Problems    Diagnosis    Primary Problem: COVID       1. Acute COVID 19 infection.  Continue Paxlovid.  Today is day 4. Or 5.  Not demonstrating any systemic symptoms.  Shortness a breath improved.    2. Acute hypoxic respiratory failure secondary to COVID.  Confirmed  Will require oxygen challenge prior to discharge    3. Acute on Chronic systolic congestive heart failure.  Improved.    4. Severe protein calorie malnutrition.  Encourage p.o. intake.     PT/OT: Yes    Disposition Planning:   Home with home health    Subjective:  No chest pain or palpitations.  Less shortness breath reported today.  Bowel movement day before yesterday.  Voiding.  Overall feels better.  Does report having bowel movements    acetaminophen (TYLENOL) tablet, 650 mg, Oral, Q6H PRN  aluminum-magnesium hydroxide-simethicone (MAG-AL PLUS) 200-200-20 mg per 5 mL oral liquid, 20 mL, Oral, Q4H PRN  aspirin (ECOTRIN) enteric coated tablet 81 mg, 81 mg, Oral, Daily  [Held by provider] atorvastatin (LIPITOR) tablet, 40 mg, Oral, QPM  carvedilol (COREG) tablet, 3.125 mg, Oral, 2x/day-Food  enoxaparin PF (LOVENOX) 30 mg/0.3 mL SubQ injection, 30 mg, Subcutaneous, Q24H  furosemide (LASIX) 10 mg/mL injection, 20 mg, Intravenous, 2x/day  lactulose (ENULOSE) 20g per 30mL oral liquid, 30 mL, Oral, 3x/day  levothyroxine (SYNTHROID) tablet, 75 mcg, Oral, QAM  lisinopril (PRINIVIL) tablet, 2.5 mg, Oral, Daily  metoclopramide (REGLAN) tablet, 5 mg, Oral, 3x/day AC  nirmatrelvir-ritonavir (PAXLOVID) 150-100mg  tablet therapy pack - Renally Dosed Preparation, 2 Tablet, Oral, 2x/day  NS flush syringe, 3 mL, Intracatheter, Q8HRS  NS flush syringe, 3 mL, Intracatheter, Q1H PRN  ondansetron (ZOFRAN) 2 mg/mL injection, 4 mg, Intravenous, Q6H PRN  potassium chloride (KCLOR-CON)  extended release tablet, 10 mEq, Oral, 2x/day-Food  sennosides-docusate sodium (SENOKOT-S) 8.6-50mg  per tablet, 1 Tablet, Oral, 2x/day  simethicone (MYLICON) chewable tablet, 160 mg, Oral, 4x/day        Physical Exam:    Vital Signs:  BP (!) 109/56   Pulse 82   Temp 36.5 C (97.7 F)   Resp 18   Ht 1.575 m (5\' 2" )   Wt (!) 32.7 kg (72 lb 1.5 oz)   LMP  (LMP Unknown)   SpO2 97%   BMI 13.19 kg/m         I/O:  I/O last 24 hours:  No intake or output data in the 24 hours ending 08/11/23 1334    I/O current shift:  No intake/output data recorded.  Blood Sugars:     General:  Sitting in a chair.  Alert and cooperative.  Cardio: Regular rate and rhythm. Normal S1 and S2. No murmurs, gallops, or rubs.   Resp: Clear to auscultation bilaterally. No wheezes, rales, rhonchi or crackles. Normal resp effort.  Abd: Bowel sounds present, Soft, non-tender  Extremities: No edema or swelling noted.  No calf pain      Labs:     Results for orders placed or performed during the hospital encounter of 08/07/23 (from the past 24 hour(s))   CBC   Result Value Ref Range    WBC 4.6 3.7 - 11.0 x10^3/uL    RBC 3.40 (L) 3.85 - 5.22 x10^6/uL  HGB 10.8 (L) 11.5 - 16.0 g/dL    HCT 78.2 (L) 95.6 - 46.0 %    MCV 94.4 78.0 - 100.0 fL    MCH 31.8 26.0 - 32.0 pg    MCHC 33.6 31.0 - 35.5 g/dL    RDW-CV 21.3 08.6 - 57.8 %    PLATELETS 227 150 - 400 x10^3/uL    MPV 9.8 8.7 - 12.5 fL   BASIC METABOLIC PANEL   Result Value Ref Range    SODIUM 133 (L) 136 - 145 mmol/L    POTASSIUM 4.4 3.5 - 5.1 mmol/L    CHLORIDE 94 (L) 96 - 111 mmol/L    CO2 TOTAL 32 (H) 23 - 31 mmol/L    ANION GAP 7 4 - 13 mmol/L    CALCIUM 8.7 8.6 - 10.3 mg/dL    GLUCOSE 82 65 - 469 mg/dL    BUN 14 8 - 25 mg/dL    CREATININE 6.29 5.28 - 1.05 mg/dL    BUN/CREA RATIO 17 6 - 22    ESTIMATED GFR - FEMALE 69 >=60 mL/min/BSA   MAGNESIUM   Result Value Ref Range    MAGNESIUM 2.1 1.8 - 2.6 mg/dL   PHOSPHORUS   Result Value Ref Range    PHOSPHORUS 3.0 2.3 - 4.0 mg/dL       Micro: No  results found for any visits on 08/07/23 (from the past 96 hour(s)).    Radiology:           Marylee Floras, MD    08/11/2023   10:32  [facility]  [facility] Medicine

## 2023-08-11 NOTE — Care Plan (Signed)
Problem: Adult Inpatient Plan of Care  Goal: Plan of Care Review  Outcome: Ongoing (see interventions/notes)  Goal: Patient-Specific Goal (Individualized)  Outcome: Ongoing (see interventions/notes)  Goal: Absence of Hospital-Acquired Illness or Injury  Outcome: Ongoing (see interventions/notes)  Intervention: Identify and Manage Fall Risk  Recent Flowsheet Documentation  Taken 08/11/2023 1000 by Lenna Sciara, LPN  Safety Promotion/Fall Prevention: safety round/check completed  Intervention: Prevent Skin Injury  Recent Flowsheet Documentation  Taken 08/11/2023 1000 by Lenna Sciara, LPN  Body Position: sitting  Skin Protection: adhesive use limited  Goal: Optimal Comfort and Wellbeing  Outcome: Ongoing (see interventions/notes)  Intervention: Provide Person-Centered Care  Recent Flowsheet Documentation  Taken 08/11/2023 1000 by Lenna Sciara, LPN  Trust Relationship/Rapport:   care explained   choices provided   questions answered   questions encouraged   thoughts/feelings acknowledged  Goal: Rounds/Family Conference  Outcome: Ongoing (see interventions/notes)     Problem: Health Knowledge, Opportunity to Enhance (Adult,Obstetrics,Pediatric)  Goal: Knowledgeable about Health Subject/Topic  Description: Patient will demonstrate the desired outcomes by discharge/transition of care.  Outcome: Ongoing (see interventions/notes)  Intervention: Enhance Health Knowledge  Recent Flowsheet Documentation  Taken 08/11/2023 1000 by Lenna Sciara, LPN  Family/Support System Care:   self-care encouraged   support provided  Supportive Measures: active listening utilized     Problem: Gas Exchange Impaired  Goal: Optimal Gas Exchange  Outcome: Ongoing (see interventions/notes)     Problem: COVID-19 Confirmed or Rule-Out  Description: Resident requires specific care to avoid complications secondary to COVID-19  Goal: Resident will remain free of complications due to COVID-19  Description: 1. Review and update the resident's isolation status in the  Isolation activity  2. Keep the resident's door closed at all times and limit movement of the resident outside of the room to medically essential purposes. If applicable, transfer resident to a single-person room   3. Educate and reinforce infection prevention and control practices recommended by CDC  4. Frequently monitor for development of more severe symptoms   5. Use appropriate PPE when providing care for resident  6. Reinforce no visitor policy and non-essential health care personnel policy, except for certain compassionate care situations  7. If worsening of symptoms occur, alert the nearest Hospital caring for confirmed COVID-19 patients and arrange for transfer with proper precautions including placing a facemask on the resident during transfer  8. Communicate information about known or suspected case of COVID-19 to appropraite public health personnel  9. Avoid procedures that are likely to induce coughing (e.g., sputum induction, open suctioning of airways). If required, do so in a Airborne Infection Isolation Room. The health care provider in the room should wear an N95 or higher-level respirator, eye protection, gloves, and a gown. The number of HCP present during the procedure should be limited to only those essential for resident care and procedure support. Visitors should not be present for the procedure. Clean and disinfect procedure room surfaces promptly  10. Update resident, family, and resident representatives as needed      Outcome: Ongoing (see interventions/notes)     Problem: Heart Failure  Goal: Optimal Coping  Outcome: Ongoing (see interventions/notes)  Intervention: Support Psychosocial Response  Recent Flowsheet Documentation  Taken 08/11/2023 1000 by Lenna Sciara, LPN  Family/Support System Care:   self-care encouraged   support provided  Supportive Measures: active listening utilized  Goal: Optimal Cardiac Output and Blood Flow  Outcome: Ongoing (see interventions/notes)  Goal: Stable Heart  Rate and Rhythm  Outcome: Ongoing (see interventions/notes)  Goal: Fluid and Electrolyte Balance  Outcome: Ongoing (see interventions/notes)  Goal: Optimal Functional Ability  Outcome: Ongoing (see interventions/notes)  Intervention: Optimize Functional Ability  Recent Flowsheet Documentation  Taken 08/11/2023 1000 by Lenna Sciara, LPN  Self-Care Promotion:   BADL personal objects within reach   BADL personal routines maintained  Activity Management: up in chair  Goal: Improved Oral Intake  Outcome: Ongoing (see interventions/notes)  Goal: Effective Oxygenation and Ventilation  Outcome: Ongoing (see interventions/notes)  Intervention: Promote Airway Secretion Clearance  Recent Flowsheet Documentation  Taken 08/11/2023 1000 by Lenna Sciara, LPN  Activity Management: up in chair  Goal: Effective Breathing Pattern During Sleep  Outcome: Ongoing (see interventions/notes)     Problem: Fall Injury Risk  Goal: Absence of Fall and Fall-Related Injury  Outcome: Ongoing (see interventions/notes)  Intervention: Identify and Manage Contributors  Recent Flowsheet Documentation  Taken 08/11/2023 1000 by Lenna Sciara, LPN  Self-Care Promotion:   BADL personal objects within reach   BADL personal routines maintained  Intervention: Promote Injury-Free Environment  Recent Flowsheet Documentation  Taken 08/11/2023 1000 by Lenna Sciara, LPN  Safety Promotion/Fall Prevention: safety round/check completed     Problem: Skin Injury Risk Increased  Goal: Skin Health and Integrity  Outcome: Ongoing (see interventions/notes)  Intervention: Optimize Skin Protection  Recent Flowsheet Documentation  Taken 08/11/2023 1000 by Lenna Sciara, LPN  Pressure Reduction Techniques: Frequent weight shifting encouraged  Skin Protection: adhesive use limited  Activity Management: up in chair

## 2023-08-12 ENCOUNTER — Institutional Professional Consult (permissible substitution): Payer: Medicare Other | Admitting: Emergency Medicine

## 2023-08-12 LAB — BASIC METABOLIC PANEL
ANION GAP: 7 mmol/L (ref 4–13)
BUN/CREA RATIO: 18 (ref 6–22)
BUN: 15 mg/dL (ref 8–25)
CALCIUM: 8.7 mg/dL (ref 8.6–10.3)
CHLORIDE: 95 mmol/L — ABNORMAL LOW (ref 96–111)
CO2 TOTAL: 34 mmol/L — ABNORMAL HIGH (ref 23–31)
CREATININE: 0.85 mg/dL (ref 0.60–1.05)
ESTIMATED GFR - FEMALE: 67 mL/min/BSA (ref >=60)
GLUCOSE: 82 mg/dL (ref 65–125)
POTASSIUM: 4.4 mmol/L (ref 3.5–5.1)
SODIUM: 136 mmol/L (ref 136–145)

## 2023-08-12 LAB — CBC
HCT: 32.2 % — ABNORMAL LOW (ref 34.8–46.0)
HGB: 10.7 g/dL — ABNORMAL LOW (ref 11.5–16.0)
MCH: 31.3 pg (ref 26.0–32.0)
MCHC: 33.2 g/dL (ref 31.0–35.5)
MCV: 94.2 fL (ref 78.0–100.0)
MPV: 9.2 fL (ref 8.7–12.5)
PLATELETS: 215 10*3/uL (ref 150–400)
RBC: 3.42 10*6/uL — ABNORMAL LOW (ref 3.85–5.22)
RDW-CV: 12.5 % (ref 11.5–15.5)
WBC: 5 10*3/uL (ref 3.7–11.0)

## 2023-08-12 LAB — PHOSPHORUS: PHOSPHORUS: 3.3 mg/dL (ref 2.3–4.0)

## 2023-08-12 LAB — MAGNESIUM: MAGNESIUM: 2.1 mg/dL (ref 1.8–2.6)

## 2023-08-12 NOTE — Care Plan (Signed)
Problem: Adult Inpatient Plan of Care  Goal: Plan of Care Review  Outcome: Ongoing (see interventions/notes)  Goal: Patient-Specific Goal (Individualized)  Outcome: Ongoing (see interventions/notes)  Goal: Absence of Hospital-Acquired Illness or Injury  Outcome: Ongoing (see interventions/notes)  Intervention: Prevent Skin Injury  Recent Flowsheet Documentation  Taken 08/12/2023 1100 by Alfonso Ellis, RN  Body Position: supine, head elevated  Skin Protection:   adhesive use limited   tubing/devices free from skin contact  Intervention: Prevent and Manage VTE (Venous Thromboembolism) Risk  Recent Flowsheet Documentation  Taken 08/12/2023 1100 by Alfonso Ellis, RN  VTE Prevention/Management:   ambulation promoted   dorsiflexion/plantar flexion performed  Goal: Optimal Comfort and Wellbeing  Outcome: Ongoing (see interventions/notes)  Intervention: Provide Person-Centered Care  Recent Flowsheet Documentation  Taken 08/12/2023 1100 by Alfonso Ellis, RN  Trust Relationship/Rapport:   care explained   choices provided   emotional support provided   empathic listening provided   questions answered   reassurance provided   thoughts/feelings acknowledged   questions encouraged  Goal: Rounds/Family Conference  Outcome: Ongoing (see interventions/notes)     Problem: Health Knowledge, Opportunity to Enhance (Adult,Obstetrics,Pediatric)  Goal: Knowledgeable about Health Subject/Topic  Description: Patient will demonstrate the desired outcomes by discharge/transition of care.  Outcome: Ongoing (see interventions/notes)  Intervention: Enhance Health Knowledge  Recent Flowsheet Documentation  Taken 08/12/2023 1100 by Alfonso Ellis, RN  Supportive Measures:   active listening utilized   self-care encouraged     Problem: Gas Exchange Impaired  Goal: Optimal Gas Exchange  Outcome: Ongoing (see interventions/notes)  Intervention: Optimize Oxygenation and Ventilation  Recent Flowsheet Documentation  Taken 08/12/2023 1100 by Alfonso Ellis, RN  Head of Bed [facility])  Positioning: HOB elevated     Problem: COVID-19 Confirmed or Rule-Out  Description: Resident requires specific care to avoid complications secondary to COVID-19  Goal: Resident will remain free of complications due to COVID-19  Description: 1. Review and update the resident's isolation status in the Isolation activity  2. Keep the resident's door closed at all times and limit movement of the resident outside of the room to medically essential purposes. If applicable, transfer resident to a single-person room   3. Educate and reinforce infection prevention and control practices recommended by CDC  4. Frequently monitor for development of more severe symptoms   5. Use appropriate PPE when providing care for resident  6. Reinforce no visitor policy and non-essential health care personnel policy, except for certain compassionate care situations  7. If worsening of symptoms occur, alert the nearest Hospital caring for confirmed COVID-19 patients and arrange for transfer with proper precautions including placing a facemask on the resident during transfer  8. Communicate information about known or suspected case of COVID-19 to appropraite public health personnel  9. Avoid procedures that are likely to induce coughing (e.g., sputum induction, open suctioning of airways). If required, do so in a Airborne Infection Isolation Room. The health care provider in the room should wear an N95 or higher-level respirator, eye protection, gloves, and a gown. The number of HCP present during the procedure should be limited to only those essential for resident care and procedure support. Visitors should not be present for the procedure. Clean and disinfect procedure room surfaces promptly  10. Update resident, family, and resident representatives as needed      Outcome: Ongoing (see interventions/notes)     Problem: Heart Failure  Goal: Optimal Coping  Outcome: Ongoing (see interventions/notes)  Intervention: Support Psychosocial  Response  Recent Flowsheet Documentation  Taken 08/12/2023 1100 by Alfonso Ellis, RN  Supportive Measures:   active listening utilized   self-care encouraged  Goal: Optimal Cardiac Output and Blood Flow  Outcome: Ongoing (see interventions/notes)  Intervention: Optimize Cardiac Output  Recent Flowsheet Documentation  Taken 08/12/2023 1100 by Alfonso Ellis, RN  Environmental Support: rest periods encouraged  Goal: Stable Heart Rate and Rhythm  Outcome: Ongoing (see interventions/notes)  Goal: Fluid and Electrolyte Balance  Outcome: Ongoing (see interventions/notes)  Goal: Optimal Functional Ability  Outcome: Ongoing (see interventions/notes)  Intervention: Optimize Functional Ability  Recent Flowsheet Documentation  Taken 08/12/2023 1100 by Alfonso Ellis, RN  Self-Care Promotion:   independence encouraged   BADL personal objects within reach  Activity Management:   ROM, active encouraged   up ad lib  Goal: Improved Oral Intake  Outcome: Ongoing (see interventions/notes)  Goal: Effective Oxygenation and Ventilation  Outcome: Ongoing (see interventions/notes)  Intervention: Promote Airway Secretion Clearance  Recent Flowsheet Documentation  Taken 08/12/2023 1100 by Alfonso Ellis, RN  Activity Management:   ROM, active encouraged   up ad lib  Intervention: Optimize Oxygenation and Ventilation  Recent Flowsheet Documentation  Taken 08/12/2023 1100 by Alfonso Ellis, RN  Head of Bed [facility]) Positioning: HOB elevated  Goal: Effective Breathing Pattern During Sleep  Outcome: Ongoing (see interventions/notes)     Problem: Fall Injury Risk  Goal: Absence of Fall and Fall-Related Injury  Outcome: Ongoing (see interventions/notes)  Intervention: Identify and Manage Contributors  Recent Flowsheet Documentation  Taken 08/12/2023 1100 by Alfonso Ellis, RN  Self-Care Promotion:   independence encouraged   BADL personal objects within reach     Problem: Skin Injury Risk Increased  Goal: Skin Health and Integrity  Outcome: Ongoing (see interventions/notes)  Intervention:  Optimize Skin Protection  Recent Flowsheet Documentation  Taken 08/12/2023 1100 by Alfonso Ellis, RN  Pressure Reduction Techniques: Moisture, shear and nutrition are maximized  Pressure Reduction Devices: Repositioning wedges/pillows utilized  Skin Protection:   adhesive use limited   tubing/devices free from skin contact  Activity Management:   ROM, active encouraged   up ad lib  Head of Bed (HOB) Positioning: HOB elevated

## 2023-08-12 NOTE — Care Management Notes (Signed)
Provided patient CarePort listing with CMS ratings for Home Health serving the [phone] zip code.     The list provided is available in CarePort.    Patient chose [facility] home health. FOC signed and placed on chart. Order pended. Referral sent. Shelbie Hutching, SOCIAL WORKER  08/12/2023 09:35

## 2023-08-12 NOTE — Care Plan (Signed)
Problem: Adult Inpatient Plan of Care  Goal: Plan of Care Review  Outcome: Ongoing (see interventions/notes)  Goal: Patient-Specific Goal (Individualized)  Outcome: Ongoing (see interventions/notes)  Goal: Absence of Hospital-Acquired Illness or Injury  Outcome: Ongoing (see interventions/notes)  Intervention: Identify and Manage Fall Risk  Recent Flowsheet Documentation  Taken 08/12/2023 0000 by Tiburcio Bash, LPN  Safety Promotion/Fall Prevention:   activity supervised   nonskid shoes/slippers when out of bed   safety round/check completed  Taken 08/11/2023 2000 by Tiburcio Bash, LPN  Safety Promotion/Fall Prevention:   activity supervised   fall prevention program maintained   nonskid shoes/slippers when out of bed   safety round/check completed  Intervention: Prevent Skin Injury  Recent Flowsheet Documentation  Taken 08/12/2023 0000 by Tiburcio Bash, LPN  Skin Protection: adhesive use limited  Taken 08/11/2023 2256 by Tiburcio Bash, LPN  Skin Protection: adhesive use limited  Taken 08/11/2023 2200 by Tiburcio Bash, LPN  Body Position: supine  Taken 08/11/2023 2000 by Tiburcio Bash, LPN  Body Position: supine  Skin Protection: adhesive use limited  Intervention: Prevent Infection  Recent Flowsheet Documentation  Taken 08/12/2023 0000 by Tiburcio Bash, LPN  Infection Prevention:   barrier precautions utilized   promote handwashing   rest/sleep promoted   single patient room provided  Taken 08/11/2023 2000 by Tiburcio Bash, LPN  Infection Prevention:   barrier precautions utilized   promote handwashing   rest/sleep promoted   single patient room provided  Goal: Optimal Comfort and Wellbeing  Outcome: Ongoing (see interventions/notes)  Goal: Rounds/Family Conference  Outcome: Ongoing (see interventions/notes)     Problem: Health Knowledge, Opportunity to Enhance (Adult,Obstetrics,Pediatric)  Goal: Knowledgeable about Health Subject/Topic  Description: Patient will demonstrate the desired outcomes by discharge/transition of care.  Outcome: Ongoing (see  interventions/notes)     Problem: Gas Exchange Impaired  Goal: Optimal Gas Exchange  Outcome: Ongoing (see interventions/notes)     Problem: COVID-19 Confirmed or Rule-Out  Description: Resident requires specific care to avoid complications secondary to COVID-19  Goal: Resident will remain free of complications due to COVID-19  Description: 1. Review and update the resident's isolation status in the Isolation activity  2. Keep the resident's door closed at all times and limit movement of the resident outside of the room to medically essential purposes. If applicable, transfer resident to a single-person room   3. Educate and reinforce infection prevention and control practices recommended by CDC  4. Frequently monitor for development of more severe symptoms   5. Use appropriate PPE when providing care for resident  6. Reinforce no visitor policy and non-essential health care personnel policy, except for certain compassionate care situations  7. If worsening of symptoms occur, alert the nearest Hospital caring for confirmed COVID-19 patients and arrange for transfer with proper precautions including placing a facemask on the resident during transfer  8. Communicate information about known or suspected case of COVID-19 to appropraite public health personnel  9. Avoid procedures that are likely to induce coughing (e.g., sputum induction, open suctioning of airways). If required, do so in a Airborne Infection Isolation Room. The health care provider in the room should wear an N95 or higher-level respirator, eye protection, gloves, and a gown. The number of HCP present during the procedure should be limited to only those essential for resident care and procedure support. Visitors should not be present for the procedure. Clean and disinfect procedure room surfaces promptly  10. Update resident, family, and resident representatives as needed  Outcome: Ongoing (see interventions/notes)     Problem: Heart Failure  Goal:  Optimal Coping  Outcome: Ongoing (see interventions/notes)  Goal: Optimal Cardiac Output and Blood Flow  Outcome: Ongoing (see interventions/notes)  Goal: Stable Heart Rate and Rhythm  Outcome: Ongoing (see interventions/notes)  Goal: Fluid and Electrolyte Balance  Outcome: Ongoing (see interventions/notes)  Goal: Optimal Functional Ability  Outcome: Ongoing (see interventions/notes)  Goal: Improved Oral Intake  Outcome: Ongoing (see interventions/notes)  Goal: Effective Oxygenation and Ventilation  Outcome: Ongoing (see interventions/notes)  Goal: Effective Breathing Pattern During Sleep  Outcome: Ongoing (see interventions/notes)     Problem: Fall Injury Risk  Goal: Absence of Fall and Fall-Related Injury  Outcome: Ongoing (see interventions/notes)  Intervention: Promote Scientist, clinical (histocompatibility and immunogenetics) Documentation  Taken 08/12/2023 0000 by Tiburcio Bash, LPN  Safety Promotion/Fall Prevention:   activity supervised   nonskid shoes/slippers when out of bed   safety round/check completed  Taken 08/11/2023 2000 by Tiburcio Bash, LPN  Safety Promotion/Fall Prevention:   activity supervised   fall prevention program maintained   nonskid shoes/slippers when out of bed   safety round/check completed     Problem: Skin Injury Risk Increased  Goal: Skin Health and Integrity  Outcome: Ongoing (see interventions/notes)  Intervention: Optimize Skin Protection  Recent Flowsheet Documentation  Taken 08/12/2023 0000 by Tiburcio Bash, LPN  Pressure Reduction Techniques: Frequent weight shifting encouraged  Pressure Reduction Devices: Repositioning wedges/pillows utilized  Skin Protection: adhesive use limited  Taken 08/11/2023 2256 by Tiburcio Bash, LPN  Pressure Reduction Techniques: Frequent weight shifting encouraged  Pressure Reduction Devices: Repositioning wedges/pillows utilized  Skin Protection: adhesive use limited  Taken 08/11/2023 2000 by Tiburcio Bash, LPN  Pressure Reduction Techniques: Frequent weight shifting encouraged  Pressure Reduction  Devices: Repositioning wedges/pillows utilized  Skin Protection: adhesive use limited

## 2023-08-12 NOTE — Nurses Notes (Signed)
Pt resting in bed, vs stable, pt alert and oriented x 4. No c/o pain or discomfort noted, pt is able to transfer to bedside commode when needed, and is using purewick pt tolerating well, IV site clean dry and intact flushing within normal limits. Plan of care on going

## 2023-08-12 NOTE — Nurses Notes (Signed)
Patient is alert and oriented. Free from falls. IV patent. No other complaints or concerns at this time. Lonia Chimera, RN

## 2023-08-12 NOTE — Care Management Notes (Signed)
 Referral Information  ++++++ Placed Provider #1 ++++++  Case Manager: Shelbie Hutching  Provider Type: Home Health  Provider Name: [facility],  [place], Eduard Roux [facility]  Address:  [address]  [place], [place] [address]  Contact: stephenie bartlett    Phone: [phone]  Fax:   Fax: [phone]

## 2023-08-12 NOTE — Progress Notes (Signed)
Internal Medicine Progress Note    Names:  [name]  Date of service: 08/12/2023  Date of Admission:  08/07/2023  Hospital Day:  LOS: 5 days     Assessment/ Plan:   Active Hospital Problems    Diagnosis    Primary Problem: COVID       1. Acute COVID 19 infection.  Continue Paxlovid.  Today is day 5.  Not demonstrating any systemic symptoms.  Shortness a breath improved.    2. Acute hypoxic respiratory failure secondary to COVID 19.  Oxygen challenge today    3. Acute on Chronic systolic congestive heart failure.  Improved.    4. Severe protein calorie malnutrition.  Encourage p.o. intake.     PT/OT: Yes    Disposition Planning:   Home with home health    Subjective:  No chest pain or palpitations.  Less shortness breath reported today.  Bowel movement day before yesterday.  Voiding without difficulty.  Overall feels better.  Does report having bowel movements    acetaminophen (TYLENOL) tablet, 650 mg, Oral, Q6H PRN  aluminum-magnesium hydroxide-simethicone (MAG-AL PLUS) 200-200-20 mg per 5 mL oral liquid, 20 mL, Oral, Q4H PRN  aspirin (ECOTRIN) enteric coated tablet 81 mg, 81 mg, Oral, Daily  [Held by provider] atorvastatin (LIPITOR) tablet, 40 mg, Oral, QPM  carvedilol (COREG) tablet, 3.125 mg, Oral, 2x/day-Food  enoxaparin PF (LOVENOX) 30 mg/0.3 mL SubQ injection, 30 mg, Subcutaneous, Q24H  furosemide (LASIX) 10 mg/mL injection, 20 mg, Intravenous, 2x/day  lactulose (ENULOSE) 20g per 30mL oral liquid, 30 mL, Oral, 3x/day  levothyroxine (SYNTHROID) tablet, 75 mcg, Oral, QAM  lisinopril (PRINIVIL) tablet, 2.5 mg, Oral, Daily  metoclopramide (REGLAN) tablet, 5 mg, Oral, 3x/day AC  NS flush syringe, 3 mL, Intracatheter, Q8HRS  NS flush syringe, 3 mL, Intracatheter, Q1H PRN  ondansetron (ZOFRAN) 2 mg/mL injection, 4 mg, Intravenous, Q6H PRN  potassium chloride (KCLOR-CON) extended release tablet, 10 mEq, Oral, 2x/day-Food  sennosides-docusate sodium (SENOKOT-S) 8.6-50mg  per tablet, 1 Tablet, Oral,  2x/day  simethicone (MYLICON) chewable tablet, 160 mg, Oral, 4x/day        Physical Exam:    Vital Signs:  BP (!) 101/54   Pulse 97   Temp 36.3 C (97.3 F)   Resp 18   Ht 1.575 m (5\' 2" )   Wt (!) 32.7 kg (72 lb 1.5 oz)   LMP  (LMP Unknown)   SpO2 97%   BMI 13.19 kg/m         I/O:  I/O last 24 hours:    Intake/Output Summary (Last 24 hours) at 08/12/2023 1320  Last data filed at 08/11/2023 2100  Gross per 24 hour   Intake 670 ml   Output --   Net 670 ml       I/O current shift:  No intake/output data recorded.  Blood Sugars:     General:  Appears comfortable  Cardio: Regular rate and rhythm. Normal S1 and S2. No murmurs, gallops, or rubs.   Resp: Clear to auscultation bilaterally. No wheezes, rales, rhonchi or crackles. Normal resp effort.  Good air movement noted today  Abd: Bowel sounds present, Soft, non-tender  Extremities: No edema or swelling noted.  No calf pain      Labs:     Results for orders placed or performed during the hospital encounter of 08/07/23 (from the past 24 hour(s))   CBC   Result Value Ref Range    WBC 5.0 3.7 - 11.0 x10^3/uL    RBC 3.42 (  L) 3.85 - 5.22 x10^6/uL    HGB 10.7 (L) 11.5 - 16.0 g/dL    HCT 47.8 (L) 29.5 - 46.0 %    MCV 94.2 78.0 - 100.0 fL    MCH 31.3 26.0 - 32.0 pg    MCHC 33.2 31.0 - 35.5 g/dL    RDW-CV 62.1 30.8 - 65.7 %    PLATELETS 215 150 - 400 x10^3/uL    MPV 9.2 8.7 - 12.5 fL   BASIC METABOLIC PANEL   Result Value Ref Range    SODIUM 136 136 - 145 mmol/L    POTASSIUM 4.4 3.5 - 5.1 mmol/L    CHLORIDE 95 (L) 96 - 111 mmol/L    CO2 TOTAL 34 (H) 23 - 31 mmol/L    ANION GAP 7 4 - 13 mmol/L    CALCIUM 8.7 8.6 - 10.3 mg/dL    GLUCOSE 82 65 - 846 mg/dL    BUN 15 8 - 25 mg/dL    CREATININE 9.62 9.52 - 1.05 mg/dL    BUN/CREA RATIO 18 6 - 22    ESTIMATED GFR - FEMALE 67 >=60 mL/min/BSA   MAGNESIUM   Result Value Ref Range    MAGNESIUM 2.1 1.8 - 2.6 mg/dL   PHOSPHORUS   Result Value Ref Range    PHOSPHORUS 3.3 2.3 - 4.0 mg/dL       Micro: No results found for any visits on  08/07/23 (from the past 96 hour(s)).    Radiology:           Marylee Floras, MD    08/12/2023   10:32  [facility]  [facility]

## 2023-08-13 ENCOUNTER — Other Ambulatory Visit: Payer: Self-pay

## 2023-08-13 ENCOUNTER — Non-Acute Institutional Stay: Payer: Medicare Hospice

## 2023-08-13 MED ORDER — CARVEDILOL 3.125 MG TABLET
3.1250 mg | ORAL_TABLET | Freq: Every day | ORAL | Status: AC
Start: 2023-08-13 — End: 2024-08-07

## 2023-08-13 MED ORDER — POTASSIUM CHLORIDE ER 10 MEQ TABLET,EXTENDED RELEASE
10.0000 meq | ORAL_TABLET | Freq: Two times a day (BID) | ORAL | 11 refills | Status: AC
Start: 2023-08-13 — End: 2024-08-07

## 2023-08-13 NOTE — Care Plan (Signed)
Problem: Adult Inpatient Plan of Care  Goal: Absence of Hospital-Acquired Illness or Injury  Intervention: Identify and Manage Fall Risk  Recent Flowsheet Documentation  Taken 08/13/2023 0000 by Desma Mcgregor, LPN  Safety Promotion/Fall Prevention:   activity supervised   fall prevention program maintained   nonskid shoes/slippers when out of bed   safety round/check completed  Taken 08/12/2023 2200 by Desma Mcgregor, LPN  Safety Promotion/Fall Prevention:   activity supervised   fall prevention program maintained   nonskid shoes/slippers when out of bed   safety round/check completed  Taken 08/12/2023 2000 by Desma Mcgregor, LPN  Safety Promotion/Fall Prevention:   activity supervised   fall prevention program maintained   nonskid shoes/slippers when out of bed   safety round/check completed  Intervention: Prevent Skin Injury  Recent Flowsheet Documentation  Taken 08/12/2023 2000 by Desma Mcgregor, LPN  Skin Protection: adhesive use limited  Intervention: Prevent and Manage VTE (Venous Thromboembolism) Risk  Recent Flowsheet Documentation  Taken 08/12/2023 2000 by Desma Mcgregor, LPN  VTE Prevention/Management: ambulation promoted     Problem: Fall Injury Risk  Goal: Absence of Fall and Fall-Related Injury  Intervention: Promote Injury-Free Environment  Recent Flowsheet Documentation  Taken 08/13/2023 0000 by Desma Mcgregor, LPN  Safety Promotion/Fall Prevention:   activity supervised   fall prevention program maintained   nonskid shoes/slippers when out of bed   safety round/check completed  Taken 08/12/2023 2200 by Desma Mcgregor, LPN  Safety Promotion/Fall Prevention:   activity supervised   fall prevention program maintained   nonskid shoes/slippers when out of bed   safety round/check completed  Taken 08/12/2023 2000 by Desma Mcgregor, LPN  Safety Promotion/Fall Prevention:   activity supervised   fall prevention program maintained   nonskid shoes/slippers when out of bed   safety round/check completed     Problem: Skin Injury Risk Increased  Goal: Skin Health  and Integrity  Intervention: Optimize Skin Protection  Recent Flowsheet Documentation  Taken 08/12/2023 2000 by Desma Mcgregor, LPN  Pressure Reduction Techniques: Frequent weight shifting encouraged  Pressure Reduction Devices: Repositioning wedges/pillows utilized  Skin Protection: adhesive use limited

## 2023-08-13 NOTE — Nurses Notes (Signed)
 I have reviewed this patient's orders and plan of care. Currently this patient meets requirements for a low to mid level of nursing care.     Alphonzo Lemmings, GN

## 2023-08-13 NOTE — Discharge Summary (Signed)
DISCHARGE SUMMARY      Date of Service:  08/13/2023  [name], [name], 89 y.o. female  Date of Birth:  1934-10-26  PCP: Marylee Floras, MD    ADMISSION DATE:  08/07/2023  DISCHARGE DATE:  08/13/2023    ATTENDING PHYSICIAN: Marylee Floras, MD  PRIMARY CARE PHYSICIAN: Marylee Floras, MD     PRIMARY ADMISSION DIAGNOSIS: COVID    DISCHARGE DIAGNOSES:     1. Acute COVID-19 infection    2. Acute hypoxic respiratory failure secondary to COVID-19 infection resolved.    3. Acute on chronic systolic congestive heart failure     4. Severe protein calorie malnutrition  Active Hospital Problems    Diagnosis Date Noted    Principal Problem: COVID [U07.1] 08/07/2023      Resolved Hospital Problems   No resolved problems to display.     Active Non-Hospital Problems    Diagnosis Date Noted    Pleural effusion 07/12/2023    CHF (congestive heart failure) (CMS HCC) 06/29/2023    Constipation 04/01/2023    Fecal impaction of colon (CMS HCC) 02/05/2023    Protein-calorie malnutrition, unspecified severity (CMS HCC) 09/08/2022    Vitamin D deficiency 12/23/2016    High risk medication use 12/23/2016    HLD (hyperlipidemia) 12/03/2016    Hypothyroidism 06/30/2006        DISCHARGE MEDICATIONS:     Current Discharge Medication List        START taking these medications.        Details   potassium chloride 10 mEq Tablet Sustained Release  Commonly known as: KLOR-CON   10 mEq, Oral, 2 TIMES DAILY WITH FOOD  Qty: 60 Tablet  Refills: 11            CONTINUE these medications - NO CHANGES were made during your visit.        Details   aspirin 81 mg Tablet, Delayed Release (E.C.)  Commonly known as: ECOTRIN   81 mg, Oral, DAILY  Qty: 30 Tablet  Refills: 11     atorvastatin 40 mg Tablet  Commonly known as: LIPITOR   Take 1 Tablet (40 mg total) by mouth Every evening  Qty: 30 Tablet  Refills: 11     carvediloL 3.125 mg Tablet  Commonly known as: COREG   3.125 mg, Oral, DAILY  Refills: 0     furosemide 40 mg Tablet  Commonly known as: LASIX    Take 1 Tablet (40 mg total) by mouth Twice daily  Qty: 60 Tablet  Refills: 11     lactulose 20 gram/30 mL Solution  Commonly known as: ENULOSE   30 mL, Oral, 3 TIMES DAILY  Qty: 8100 mL  Refills: 3     levothyroxine 75 mcg Tablet  Commonly known as: SYNTHROID   75 mcg, Oral, EVERY MORNING  Qty: 90 Tablet  Refills: 3     lidocaine 4 % Adhesive Patch, Medicated   Place 1 Patch on the skin Once a day  Qty: 30 Patch  Refills: 11     lisinopriL 2.5 mg Tablet  Commonly known as: PRINIVIL   2.5 mg, Oral, DAILY  Qty: 30 Tablet  Refills: 11     metoclopramide HCl 5 mg Tablet  Commonly known as: REGLAN   Take 1 Tablet (5 mg total) by mouth Three times daily before meals  Qty: 90 Tablet  Refills: 11     sennosides-docusate sodium 8.6-50 mg Tablet  Commonly known as: SENOKOT-S  1 Tablet, Oral, 2 TIMES DAILY  Qty: 60 Tablet  Refills: 11     simethicone 80 mg Tablet, Chewable  Commonly known as: MYLICON   Chew and swallow 2 Tablets (160 mg total) by mouth Four times a day  Qty: 240 Tablet  Refills: 11              DISCHARGE INSTRUCTIONS:      Refer to Home Health - [name] [place]   Referral Type: Home Health   Requested Specialty: HOME HEALTH AGENCY   Number of Visits Requested: 999       REASON FOR HOSPITALIZATION AND HOSPITAL COURSE:  This is a 89 y.o. female shortness a breath.  COVID positive.  Treated with antiviral agents Paxlovid.  Significant improvement symptoms.  Patient's acute hypoxic respiratory failure resolved.  Room air pulse ox was 100% at the time of discharge.  Episode of acute on chronic systolic congestive heart failure treated with IV Lasix.  Resolved.  Patient received counseling on nutrition for severe protein calorie malnutrition.    PHYSICAL EXAM AT DISCHARGE:   Temperature: 36.3 C (97.4 F)  Heart Rate: 82  BP (Non-Invasive): 104/61  Respiratory Rate: 16  SpO2: 100 %  General: no distress  Eyes: Conjunctiva clear., Sclera non-icteric.   Lungs: Breathing nonlabored, Clear  to auscultation bilaterally.   Cardiovascular: regular rate and rhythm, S1, S2 normal, no murmur, click, rub or gallop  Abdomen: non-distended, Soft, non-tender, Bowel sounds normal  Extremities: No edema  Skin: Skin warm and dry      SIGNIFICANT LAB: Lab Results for Last 24 Hours:  No results found for any visits on 08/07/23 (from the past 24 hour(s)).    SIGNIFICANT RADIOLOGY:  Reviewed    CONSULTATIONS:  None    PROCEDURES PERFORMED:  None    CONDITION ON DISCHARGE: Alert, Oriented, and VS Stable    DISCHARGE DISPOSITION:  Home discharge     ISSUES FOR OUTPATIENT F/U:   Office visit to see me within 1-2 weeks of discharge    I spent more than 30 minutes discharging this patient, including review of current and discharge medications, final assessment, diagnosis and treatment of the patient; discussion of hospital course and discharge instructions with the patient and/or family; and arrangements for follow-up care, including appointments, needed prescriptions, referrals, and/or preparation of discharge records.      cc: Primary Care Physician:  Marylee Floras, MD  [address]  [facility] [address]     [id number] Physician:  No referring provider defined for this encounter.     Marylee Floras, MD1/30/202512:48

## 2023-08-13 NOTE — Nurses Notes (Signed)
Pt alert and oriented this shift. Vitals stable. On 2L NC. Will continue to monitor.      [name],LPN

## 2023-08-13 NOTE — Nurses Notes (Signed)
Discharge to home by private vehicle with [facility].  Report called by Lonia Chimera, RN.

## 2023-08-13 NOTE — Nursing Note (Signed)
 Case conference scheduled

## 2023-08-14 ENCOUNTER — Telehealth (HOSPITAL_COMMUNITY): Payer: Self-pay | Admitting: Internal Medicine

## 2023-08-14 ENCOUNTER — Other Ambulatory Visit: Payer: Self-pay

## 2023-08-14 NOTE — Telephone Encounter (Signed)
Transition of Care Contact Information  Discharge Date: 08/13/2023  Transition Facility Type--Hospital (Inpatient or Observation)  Facility [facility]  Interactive Contact(s): Completed or attempted contact indicated by Date/Time  First Attempt Call: 08/14/2023 10:26 AM  Second Attempted Contact: 08/14/2023  1:00 PM  Contact Method(s)-- Patient/Caregiver Telephone  Clinical Staff Name/Role who contacted--Retia Cordle Peggye Form, BSN, RN

## 2023-08-15 ENCOUNTER — Non-Acute Institutional Stay: Payer: Medicare Hospice | Attending: Internal Medicine

## 2023-08-15 ENCOUNTER — Other Ambulatory Visit: Payer: Self-pay

## 2023-08-15 NOTE — Home Health (Signed)
89 yo female with PMH of CHF admitted for CHF assessment and agreeable to [facility]. Paitent is currently DNR and Daughter is MPOA. She is currently living with duaghter and using wlaker for mobility. Daughter assists patient with mobility and ADLS. 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 Provider made aware. Care plan created and reviewed with patient. Medications reconciled, reviewed, and updated with patient. Education regarding indication, frequency, dosage, and possible side effects completed. Medication bottles compared to recent medication list with no issues. Patient denies any questions/concerns at this time. Pandemic teaching and emergent number education completed. Fall precautions and s/s to report to [facility] agency/MD education also completed. Patient verbalized understanding. Head to toe assessment and vitals completed and WNL. No concerns at this time.

## 2023-08-17 ENCOUNTER — Other Ambulatory Visit: Payer: Self-pay

## 2023-08-17 DIAGNOSIS — R9431 Abnormal electrocardiogram [ECG] [EKG]: Secondary | ICD-10-CM

## 2023-08-17 DIAGNOSIS — I440 Atrioventricular block, first degree: Secondary | ICD-10-CM

## 2023-08-17 DIAGNOSIS — I252 Old myocardial infarction: Secondary | ICD-10-CM

## 2023-08-17 LAB — ECG 12 LEAD - ED USE
Atrial Rate: 93 {beats}/min
Calculated P Axis: 0 degrees
Calculated R Axis: -28 degrees
Calculated T Axis: 133 degrees
PR Interval: 234 ms
QRS Duration: 116 ms
QT Interval: 400 ms
QTC Calculation: 497 ms
Ventricular rate: 93 {beats}/min

## 2023-08-18 ENCOUNTER — Ambulatory Visit: Payer: Medicare Hospice

## 2023-08-18 ENCOUNTER — Other Ambulatory Visit: Payer: Medicare Hospice

## 2023-08-18 ENCOUNTER — Other Ambulatory Visit: Payer: Self-pay

## 2023-08-18 NOTE — Home Health (Signed)
SN assessment for wound to coccyx, barrier cream applied and border dressing placed. Patient's daughter independent with wound care. SN visits decreased to once weekly. Pending PT eval. No other nursing needs at this time.

## 2023-08-19 ENCOUNTER — Ambulatory Visit: Payer: Medicare Hospice

## 2023-08-19 ENCOUNTER — Other Ambulatory Visit: Payer: Self-pay

## 2023-08-19 ENCOUNTER — Ambulatory Visit: Payer: Self-pay

## 2023-08-20 ENCOUNTER — Ambulatory Visit: Payer: Medicare Hospice

## 2023-08-20 ENCOUNTER — Telehealth: Payer: Self-pay

## 2023-08-20 ENCOUNTER — Other Ambulatory Visit: Payer: Medicare Hospice

## 2023-08-20 NOTE — Telephone Encounter (Signed)
[facility] patient update:    Phoned again this day, unable to leave a VM as it says to enter a remote access code  Return call number for [phone].   Will continue to follow.

## 2023-08-20 NOTE — Nursing Note (Signed)
Services currently provided to patient:  Skilled Nursing  Physical Therapy    Case Conference Notes:  Patient discussed for case conference date 08/20/23. No issues noted at the time of this case conference.     Summary of Care:       Priority Issues:       Discipline Notes:  SN Note Author: A Byrd. Date: 08/19/23.  Note: SN for weekly assessment for coccyx wound. Patient daughter is MPOA and caregiver and patient resides with daughter. Daughter independent with dressing changes. Patient does not drive. PT pending eval. Pat, ient uses walker. Medicare patient, lacks capacity. HH goal is dc when PT is completed and pressure wound education and care. No other needs at this time.

## 2023-08-21 ENCOUNTER — Ambulatory Visit: Payer: Medicare Hospice

## 2023-08-21 ENCOUNTER — Other Ambulatory Visit: Payer: Self-pay

## 2023-08-24 ENCOUNTER — Other Ambulatory Visit: Payer: Self-pay

## 2023-08-24 ENCOUNTER — Ambulatory Visit: Payer: Medicare Hospice

## 2023-08-25 ENCOUNTER — Ambulatory Visit: Payer: Medicare Hospice

## 2023-08-25 ENCOUNTER — Other Ambulatory Visit: Payer: Medicare Hospice

## 2023-08-26 ENCOUNTER — Other Ambulatory Visit: Payer: Medicare Hospice

## 2023-08-26 ENCOUNTER — Other Ambulatory Visit: Payer: Self-pay

## 2023-08-26 ENCOUNTER — Ambulatory Visit: Payer: Medicare Hospice

## 2023-08-26 NOTE — Home Health (Signed)
Assessment to coccyx, red area, daughter independent with care, dressing removed but daughter will place new one on after shower today. Left open to air.

## 2023-08-27 ENCOUNTER — Other Ambulatory Visit: Payer: Medicare Hospice | Admitting: Rehabilitative and Restorative Service Providers"

## 2023-08-27 ENCOUNTER — Other Ambulatory Visit: Payer: Self-pay

## 2023-08-27 NOTE — Case Communication (Signed)
Physical therapy evaluation on 2/13. Patient being seen by home health following COVID and CHF with hospitalization from 1/24 to 1/30. PMH: fibromyalgia, hypercholesterolemia, thyroid disease (hypo), HLD, and vitamin D deficiency. Patient identified by name and DOB. Patient reports that she has been doing well since getting back from the hospital. Her daughter states that she has been sleeping a lot more. She does get up to walk throughout the day. She has been needing help getting showered and dressed. PLOF: independent with all self-care and daily tasks until June. She got very sick then, and she has been going downhill since then. She was able to independently ambulate through the house with a rollator, and she was able to do light activities for herself prior to this hospitalization. Patient lives her daughter and son-in-law in a single level home requiring 3 stairs to enter/exit. She will have someone at home with her at all times. Patient demonstrates weakness, difficulty negotiating stairs, decreased balance, poor endurance, impaired gait, and difficulty transferring. This places the patient at risk for falls. Upon evaluation this date, the physical assessment revealed TInetti= 14/28.  LE Strength= 4-/5 grossly. TCR= 0. 2 minute step test= 42 (2 standing breaks). She is able to transfer supine<>sit with standby assisance. She has difficulty getting her body into position. She is able to transfer sit<>stand with standby assistance and use of B UEs to push. She is unsteady and transfer time 3 seconds. She is able to ambulate 100 feet with rollator and CGA. She is slow and unsteady with decreased step length bilaterally. She is likely limited due to weakness, poor balance, and poor endurance. Provided patient with comprehensive written HEP for strength. Educated to have rollator and assistance when up. Patient's current safety concerns, fall risk, inability to leave home without assistance, and inability of  caregiver to safely assist patient in activities of daily living appropriate need for home health physical therapy services. Patient requires further strengthening, education, and training on use of assistance device, balance, gait, stair training, and progression of HEP to increase functional independence and ensure safety in home and community environments. Plan to continue 2 x 3 weeks before reassessment for further needs.

## 2023-08-27 NOTE — Home Health (Signed)
Physical therapy evaluation on 2/13. Patient being seen by home health following COVID and CHF with hospitalization from 1/24 to 1/30. PMH: fibromyalgia, hypercholesterolemia, thyroid disease (hypo), HLD, and vitamin D deficiency. Patient identified by name and DOB. Patient reports that she has been doing well since getting back from the hospital. Her daughter states that she has been sleeping a lot more. She does get up to walk throughout the day. She has been needing help getting showered and dressed. PLOF: independent with all self-care and daily tasks until June. She got very sick then, and she has been going downhill since then. She was able to independently ambulate through the house with a rollator, and she was able to do light activities for herself prior to this hospitalization. Patient lives her daughter and son-in-law in a single level home requiring 3 stairs to enter/exit. She will have someone at home with her at all times. Patient demonstrates weakness, difficulty negotiating stairs, decreased balance, poor endurance, impaired gait, and difficulty transferring. This places the patient at risk for falls. Upon evaluation this date, the physical assessment revealed TInetti= 14/28.  LE Strength= 4-/5 grossly. TCR= 0. 2 minute step test= 42 (2 standing breaks). She is able to transfer supine<>sit with standby assisance. She has difficulty getting her body into position. She is able to transfer sit<>stand with standby assistance and use of B UEs to push. She is unsteady and transfer time 3 seconds. She is able to ambulate 100 feet with rollator and CGA. She is slow and unsteady with decreased step length bilaterally. She is likely limited due to weakness, poor balance, and poor endurance. Provided patient with comprehensive written HEP for strength. Educated to have rollator and assistance when up. Patient's current safety concerns, fall risk, inability to leave home without assistance, and inability of  caregiver to safely assist patient in activities of daily living appropriate need for home health physical therapy services. Patient requires further strengthening, education, and training on use of assistance device, balance, gait, stair training, and progression of HEP to increase functional independence and ensure safety in home and community environments. Plan to continue 2 x 3 weeks before reassessment for further needs.

## 2023-08-31 ENCOUNTER — Other Ambulatory Visit: Payer: Self-pay

## 2023-09-01 ENCOUNTER — Other Ambulatory Visit: Payer: Medicare Hospice

## 2023-09-01 NOTE — Home Health (Signed)
 SN DC, daughter independent with ADL's and applying barrier cream and border dressing to coccyx area. Red, no open area, dry skin noted. PT remains, daughter agreeable to Hospice bridge program. [name] in [facility] office notified.

## 2023-09-02 ENCOUNTER — Telehealth (HOSPICE_FACILITY): Payer: Self-pay

## 2023-09-02 ENCOUNTER — Other Ambulatory Visit: Payer: Self-pay

## 2023-09-02 NOTE — Nursing Note (Signed)
 I contacted the patient at the request of Bridge program coordinator Alona Bene to follow up on hospice inquiries and potential needs for Ms. Demps. After speaking with Lupita Leash, we confirmed that she wants to ensure her mother is enrolled in the [place] program for when Ms. Zech is ready for hospice care. According to Lupita Leash, her mother is currently able to walk, is eating well, and has no concerns that warrant hospice care at this time.      Hospice contact information provided.

## 2023-09-03 ENCOUNTER — Other Ambulatory Visit: Payer: Self-pay

## 2023-09-04 ENCOUNTER — Other Ambulatory Visit: Payer: Medicare Hospice

## 2023-09-07 ENCOUNTER — Other Ambulatory Visit: Payer: Self-pay

## 2023-09-10 ENCOUNTER — Other Ambulatory Visit: Payer: Self-pay

## 2023-09-11 ENCOUNTER — Other Ambulatory Visit: Payer: Medicare Hospice

## 2023-09-11 ENCOUNTER — Ambulatory Visit (HOSPITAL_BASED_OUTPATIENT_CLINIC_OR_DEPARTMENT_OTHER): Payer: Self-pay | Admitting: Internal Medicine

## 2023-09-11 NOTE — Home Health (Signed)
 Pt reports no pain, no falls, no changes in medications, no changes in insurance, and pt states identification x2 c name and DOB this session. Pt was sitting in recliner on PTA arrival. Pt performed gait training c rollator walker, seated exercises, and semi-reclined exercises c good response and fatigue.

## 2023-09-14 ENCOUNTER — Other Ambulatory Visit: Payer: Self-pay

## 2023-09-14 NOTE — Home Health (Signed)
 Pt reports no pain, no falls, no changes in medications, no changes in insurance, and pt states identification x2 c name and DOB this session. Pt was sitting on couch on PTA arrival. Pts daughter and pt reports that pt is having increased gas pain this date and she has not done a lot. Pt performed gait training c rollator walker, seated exercises, and repeating STS c good response and no issues noted. Next Session: PT Reassessment; Edu c pt on either pt reassessment for more PT or pt D/C from [facility] PT services c good pt understanding and no issues noted.

## 2023-09-15 ENCOUNTER — Telehealth: Payer: Self-pay

## 2023-09-15 NOTE — Telephone Encounter (Signed)
 [facility] patient update:    Phoned daughter this day, she states patient is at her baseline with no changes from previous call. HH continues to see patient, appetite is good, no recent ER visits, hospitalizations or medication changes. Will continue to follow.

## 2023-09-16 ENCOUNTER — Other Ambulatory Visit: Admitting: Rehabilitative and Restorative Service Providers"

## 2023-09-16 NOTE — Home Health (Signed)
 Physical therapy reassessment/supervisory visit 3/5. Patient reports that she has been noticing improvement since starting home health PT. She states that she has been feeling stronger, and she has been having less difficulty getting up and walking. She notes that she has still been having periods of weakness, and she still does get tired quickly. Her daughter reports that she has been doing more for herself, but she is still not yet ba ck to her baseline. She did have some missed visits after the PT evaluation, but this was due to the patient having issues. This has been fixed and PT has been able to regularly treat the patient. Considering how she is feeling, she would like to continue with PT for a few more weeks to get her back to her baseline. No falls or injuries reported. Upon iniital evaluation, patient presented deficits in strength, balance, endurance, transf ers, gait, and stairs, requiring assistance to complete all self-care and daily tasks. Despite improvement, she still presents with deficits that produce functional limitations and safety concerns. Without further skilled care, these deficits may leave the patient at higher risk of falls and/or rehospitalization. Physical assessment: LE Strength= 4/5 grossly. TCR= 0. Tinetti= 15/28. 2 minute step test= 47 (2 standing breaks). She is abl e to transfer supine<>sit with supervision assisance. She still has slight difficulty getting her body into position. She is able to transfer sit<>stand with supervision assistance and use of B UEs to push. She is unsteady and transfer time 3 seconds.  She is able to ambulate 120 feet with rollator and standby assistance. She is still slow and unsteady with decreased step length bilaterally. This has improved since the initial evaluatio n. She is able to negotiate the stairs to leave the house with minimal assistance x 1. She is slow and unsteady. She is likely limited due to weakness, poor endurance, and poor balance.  Strength has improved per MMT, but gross weakness still remains. Balance has improved per Tinetti, but she is still at high risk of falls per its cutoff. Endurance has improved per 2 minute step test, but she is still below age-related norms. Her ability  to transfer, ambulate, and negotiate stairs have improved as she requires less assistance, but she is still not yet able to do this independently. She is able to achieve a further distance with ambulation, but she is still limited to short distances. Patient has been making steady progress towards goals, but she has not met them yet. Progress may be slowed due to age, nature of condition, and severity of condition. She also had some mi ssed visits, but this was due to phone issues, which has been fixed, so no other missed visits are expected. Patient reports adherence to home exercise program. Patient requires continued home health physical therapy for strength, balance, endurance, transfers, gait, and stairs. Discussed plan to continue home home physical therapy 2 x 3 weeks. Patient/caregiver in agreement with plan of care. Plan for PT is to continue to improve funct ional limitations and safety concerns to help the patient return to baseline. PT contacted the physician about the patient having low blood pressure readings.

## 2023-09-16 NOTE — Case Communication (Signed)
 Physical therapy reassessment/supervisory visit 3/5. Patient reports that she has been noticing improvement since starting home health PT. She states that she has been feeling stronger, and she has been having less difficulty getting up and walking. She notes that she has still been having periods of weakness, and she still does get tired quickly. Her daughter reports that she has been doing more for herself, but she is still not yet ba ck to her baseline. She did have some missed visits after the PT evaluation, but this was due to the patient having issues. This has been fixed and PT has been able to regularly treat the patient. Considering how she is feeling, she would like to continue with PT for a few more weeks to get her back to her baseline. No falls or injuries reported. Upon iniital evaluation, patient presented deficits in strength, balance, endurance, transf ers, gait, and stairs, requiring assistance to complete all self-care and daily tasks. Despite improvement, she still presents with deficits that produce functional limitations and safety concerns. Without further skilled care, these deficits may leave the patient at higher risk of falls and/or rehospitalization. Physical assessment: LE Strength= 4/5 grossly. TCR= 0. Tinetti= 15/28. 2 minute step test= 47 (2 standing breaks). She is abl e to transfer supine<>sit with supervision assisance. She still has slight difficulty getting her body into position. She is able to transfer sit<>stand with supervision assistance and use of B UEs to push. She is unsteady and transfer time 3 seconds.  She is able to ambulate 120 feet with rollator and standby assistance. She is still slow and unsteady with decreased step length bilaterally. This has improved since the initial evaluatio n. She is able to negotiate the stairs to leave the house with minimal assistance x 1. She is slow and unsteady. She is likely limited due to weakness, poor endurance, and poor balance.  Strength has improved per MMT, but gross weakness still remains. Balance has improved per Tinetti, but she is still at high risk of falls per its cutoff. Endurance has improved per 2 minute step test, but she is still below age-related norms. Her ability  to transfer, ambulate, and negotiate stairs have improved as she requires less assistance, but she is still not yet able to do this independently. She is able to achieve a further distance with ambulation, but she is still limited to short distances. Patient has been making steady progress towards goals, but she has not met them yet. Progress may be slowed due to age, nature of condition, and severity of condition. She also had some mi ssed visits, but this was due to phone issues, which has been fixed, so no other missed visits are expected. Patient reports adherence to home exercise program. Patient requires continued home health physical therapy for strength, balance, endurance, transfers, gait, and stairs. Discussed plan to continue home home physical therapy 2 x 3 weeks. Patient/caregiver in agreement with plan of care. Plan for PT is to continue to improve funct ional limitations and safety concerns to help the patient return to baseline. PT contacted the physician about the patient having low blood pressure readings.

## 2023-09-17 ENCOUNTER — Ambulatory Visit: Payer: Self-pay | Admitting: Rehabilitative and Restorative Service Providers"

## 2023-09-18 ENCOUNTER — Inpatient Hospital Stay (HOSPITAL_COMMUNITY): Admitting: Internal Medicine

## 2023-09-18 ENCOUNTER — Emergency Department (HOSPITAL_COMMUNITY)

## 2023-09-18 ENCOUNTER — Inpatient Hospital Stay
Admission: EM | Admit: 2023-09-18 | Discharge: 2023-09-24 | DRG: 193 | Disposition: A | Discharge status: Home Health | Attending: Internal Medicine | Admitting: Internal Medicine

## 2023-09-18 ENCOUNTER — Other Ambulatory Visit: Payer: Self-pay

## 2023-09-18 ENCOUNTER — Encounter (HOSPITAL_COMMUNITY): Payer: Self-pay

## 2023-09-18 DIAGNOSIS — I509 Heart failure, unspecified: Secondary | ICD-10-CM | POA: Diagnosis present

## 2023-09-18 DIAGNOSIS — J189 Pneumonia, unspecified organism: Principal | ICD-10-CM | POA: Diagnosis present

## 2023-09-18 DIAGNOSIS — Z79899 Other long term (current) drug therapy: Secondary | ICD-10-CM

## 2023-09-18 DIAGNOSIS — B9620 Unspecified Escherichia coli [E. coli] as the cause of diseases classified elsewhere: Secondary | ICD-10-CM | POA: Diagnosis present

## 2023-09-18 DIAGNOSIS — Z79890 Hormone replacement therapy (postmenopausal): Secondary | ICD-10-CM

## 2023-09-18 DIAGNOSIS — K5909 Other constipation: Secondary | ICD-10-CM | POA: Diagnosis present

## 2023-09-18 DIAGNOSIS — Z66 Do not resuscitate: Secondary | ICD-10-CM | POA: Diagnosis present

## 2023-09-18 DIAGNOSIS — R10812 Left upper quadrant abdominal tenderness: Secondary | ICD-10-CM

## 2023-09-18 DIAGNOSIS — I255 Ischemic cardiomyopathy: Secondary | ICD-10-CM | POA: Diagnosis present

## 2023-09-18 DIAGNOSIS — D649 Anemia, unspecified: Secondary | ICD-10-CM | POA: Diagnosis present

## 2023-09-18 DIAGNOSIS — E782 Mixed hyperlipidemia: Secondary | ICD-10-CM | POA: Diagnosis present

## 2023-09-18 DIAGNOSIS — E039 Hypothyroidism, unspecified: Secondary | ICD-10-CM | POA: Diagnosis present

## 2023-09-18 DIAGNOSIS — E43 Unspecified severe protein-calorie malnutrition: Secondary | ICD-10-CM | POA: Diagnosis present

## 2023-09-18 DIAGNOSIS — I5022 Chronic systolic (congestive) heart failure: Secondary | ICD-10-CM | POA: Diagnosis present

## 2023-09-18 DIAGNOSIS — Z8616 Personal history of COVID-19: Secondary | ICD-10-CM

## 2023-09-18 DIAGNOSIS — F0390 Unspecified dementia without behavioral disturbance: Secondary | ICD-10-CM | POA: Diagnosis present

## 2023-09-18 DIAGNOSIS — J9601 Acute respiratory failure with hypoxia: Principal | ICD-10-CM

## 2023-09-18 DIAGNOSIS — Z681 Body mass index (BMI) 19 or less, adult: Secondary | ICD-10-CM

## 2023-09-18 DIAGNOSIS — I420 Dilated cardiomyopathy: Secondary | ICD-10-CM | POA: Diagnosis present

## 2023-09-18 DIAGNOSIS — K219 Gastro-esophageal reflux disease without esophagitis: Secondary | ICD-10-CM | POA: Diagnosis present

## 2023-09-18 DIAGNOSIS — Z1152 Encounter for screening for COVID-19: Secondary | ICD-10-CM

## 2023-09-18 DIAGNOSIS — N390 Urinary tract infection, site not specified: Secondary | ICD-10-CM | POA: Diagnosis present

## 2023-09-18 DIAGNOSIS — N3000 Acute cystitis without hematuria: Secondary | ICD-10-CM

## 2023-09-18 DIAGNOSIS — Z7982 Long term (current) use of aspirin: Secondary | ICD-10-CM

## 2023-09-18 DIAGNOSIS — I110 Hypertensive heart disease with heart failure: Secondary | ICD-10-CM | POA: Diagnosis present

## 2023-09-18 DIAGNOSIS — B370 Candidal stomatitis: Secondary | ICD-10-CM | POA: Diagnosis present

## 2023-09-18 LAB — BASIC METABOLIC PANEL
ANION GAP: 10 mmol/L (ref 4–13)
BUN/CREA RATIO: 26 — ABNORMAL HIGH (ref 6–22)
BUN: 27 mg/dL — ABNORMAL HIGH (ref 8–25)
CALCIUM: 9.1 mg/dL (ref 8.6–10.3)
CHLORIDE: 91 mmol/L — ABNORMAL LOW (ref 96–111)
CO2 TOTAL: 35 mmol/L — ABNORMAL HIGH (ref 23–31)
CREATININE: 1.02 mg/dL (ref 0.60–1.05)
ESTIMATED GFR - FEMALE: 54 mL/min/BSA — ABNORMAL LOW (ref >=60)
GLUCOSE: 119 mg/dL (ref 65–125)
POTASSIUM: 3.7 mmol/L (ref 3.5–5.1)
SODIUM: 136 mmol/L (ref 136–145)

## 2023-09-18 LAB — CBC WITH DIFF
BASOPHIL #: <0.1 10*3/uL (ref <=0.20)
BASOPHIL %: 0.2 %
EOSINOPHIL #: <0.1 10*3/uL (ref <=0.50)
EOSINOPHIL %: 0 %
HCT: 36.3 % (ref 34.8–46.0)
HGB: 12.2 g/dL (ref 11.5–16.0)
IMMATURE GRANULOCYTE #: 0.13 10*3/uL — ABNORMAL HIGH (ref <0.10)
IMMATURE GRANULOCYTE %: 0.6 % (ref 0.0–1.0)
LYMPHOCYTE #: 0.59 10*3/uL — ABNORMAL LOW (ref 1.00–4.80)
LYMPHOCYTE %: 2.8 %
MCH: 32.1 pg — ABNORMAL HIGH (ref 26.0–32.0)
MCHC: 33.6 g/dL (ref 31.0–35.5)
MCV: 95.5 fL (ref 78.0–100.0)
MONOCYTE #: 1.02 10*3/uL (ref 0.20–1.10)
MONOCYTE %: 4.8 %
MPV: 9.4 fL (ref 8.7–12.5)
NEUTROPHIL #: 19.37 10*3/uL — ABNORMAL HIGH (ref 1.50–7.70)
NEUTROPHIL %: 91.6 %
PLATELETS: 321 10*3/uL (ref 150–400)
RBC: 3.8 10*6/uL — ABNORMAL LOW (ref 3.85–5.22)
RDW-CV: 13.5 % (ref 11.5–15.5)
WBC: 21.2 10*3/uL — ABNORMAL HIGH (ref 3.7–11.0)

## 2023-09-18 LAB — PT/INR: INR: 1.17 — ABNORMAL HIGH (ref 0.80–1.10)

## 2023-09-18 LAB — HEPATIC FUNCTION PANEL
ALBUMIN: 2.7 g/dL — ABNORMAL LOW (ref 3.4–4.8)
ALKALINE PHOSPHATASE: 103 U/L (ref 55–145)
ALT (SGPT): 9 U/L (ref 8–22)
AST (SGOT): 14 U/L (ref 8–45)
BILIRUBIN DIRECT: 0.3 mg/dL (ref 0.1–0.4)
BILIRUBIN TOTAL: 0.5 mg/dL (ref 0.3–1.3)
PROTEIN TOTAL: 6.5 g/dL (ref 6.0–8.0)

## 2023-09-18 LAB — URINALYSIS, MACRO/MICRO
BILIRUBIN: NEGATIVE mg/dL
COLOR: NORMAL
GLUCOSE: NORMAL mg/dL
KETONES: NEGATIVE mg/dL
LEUKOCYTES: 500 WBCs/uL — AB
NITRITE: NEGATIVE
PH: 5.5 (ref 5.0–8.0)
PROTEIN: 70 mg/dL — AB
SPECIFIC GRAVITY: 1.013 (ref 1.005–1.030)
UROBILINOGEN: NORMAL mg/dL

## 2023-09-18 LAB — B-TYPE NATRIURETIC PEPTIDE (BNP),PLASMA: BNP: 283 pg/mL — ABNORMAL HIGH (ref 0–100)

## 2023-09-18 LAB — BLOOD GAS
%FIO2 (VENOUS): 21 %
BASE EXCESS: 12.4 mmol/L — ABNORMAL HIGH (ref 0.0–3.0)
BICARBONATE (VENOUS): 33.4 mmol/L — ABNORMAL HIGH (ref 22.0–29.0)
O2 SATURATION (VENOUS): 54.3 % (ref 40.0–85.0)
PCO2 (VENOUS): 61 mmHg — ABNORMAL HIGH (ref 41–51)
PH (VENOUS): 7.42 (ref 7.32–7.43)
PO2 (VENOUS): 28 mmHg (ref 35–50)

## 2023-09-18 LAB — LIPASE: LIPASE: 19 U/L (ref 10–60)

## 2023-09-18 LAB — TROPONIN-I: TROPONIN-I HS: 2.9 ng/L (ref <=14.0)

## 2023-09-18 LAB — PHOSPHORUS: PHOSPHORUS: 3.6 mg/dL (ref 2.3–4.0)

## 2023-09-18 LAB — MAGNESIUM: MAGNESIUM: 2.2 mg/dL (ref 1.8–2.6)

## 2023-09-18 LAB — COVID-19, FLU A/B, RSV RAPID BY PCR
INFLUENZA VIRUS TYPE A: NOT DETECTED
INFLUENZA VIRUS TYPE B: NOT DETECTED
RESPIRATORY SYNCTIAL VIRUS (RSV): NOT DETECTED
SARS-CoV-2: NOT DETECTED

## 2023-09-18 LAB — LACTIC ACID LEVEL W/ REFLEX FOR LEVEL >2.0: LACTIC ACID: 1.4 mmol/L (ref 0.5–2.2)

## 2023-09-18 MED ORDER — IOPAMIDOL 370 MG IODINE/ML (76 %) INTRAVENOUS SOLUTION
69.0000 mL | INTRAVENOUS | Status: AC
Start: 2023-09-18 — End: 2023-09-18
  Administered 2023-09-18: 69 mL via INTRAVENOUS

## 2023-09-18 MED ORDER — ONDANSETRON HCL (PF) 4 MG/2 ML INJECTION SOLUTION
2.0000 mg | INTRAMUSCULAR | Status: AC
Start: 2023-09-18 — End: 2023-09-18
  Administered 2023-09-18: 2 mg via INTRAVENOUS
  Filled 2023-09-18: qty 2

## 2023-09-18 MED ORDER — DOXYCYCLINE HYCLATE 100 MG CAPSULE
100.0000 mg | ORAL_CAPSULE | ORAL | Status: AC
Start: 2023-09-18 — End: 2023-09-18
  Administered 2023-09-18: 100 mg via ORAL
  Filled 2023-09-18: qty 1

## 2023-09-18 MED ORDER — CEFTRIAXONE 1 GRAM SOLUTION FOR INJECTION - IV DEFAULT
1.0000 g | INTRAMUSCULAR | Status: AC
Start: 2023-09-18 — End: 2023-09-18
  Administered 2023-09-18: 1 g via INTRAVENOUS
  Filled 2023-09-18: qty 10

## 2023-09-18 MED ORDER — SODIUM CHLORIDE 0.9 % IV BOLUS
250.0000 mL | INJECTION | Status: AC
Start: 2023-09-19 — End: 2023-09-19
  Administered 2023-09-18: 250 mL via INTRAVENOUS
  Administered 2023-09-19: 0 mL via INTRAVENOUS

## 2023-09-18 NOTE — ED Attending Handoff Note (Signed)
 [facility]  Department of Emergency Medicine    Date: 09/18/2023   Time: 10:08 PM    Shortness of Breath (Pt reports "I can't breath and my stomach hurts, I don't have the energy to do anything."), Weakness, and Abdominal Pain    ED Course as of 09/18/23 2344   Fri Sep 18, 2023   2205 S/o:   41F, here with daughter  COVID a few months ago; recovered well, was in normal state of health until yesterday/today  Lacking appetite today, feels nauseous, "feel like I can't breathe," malaise, fatigue  No vomiting, no bowel changes, no pain  Still awaiting some labs/urine  Has CHF - held off on fluids for now  Mild epigastric/LUQ TTP  Probably will get CT +/- admission ([facility])   2228 Desaturated to 83% on RA with good waveform while sleeping. Leukocytosis of 21, BNP mildly elevated 283. No edema/pneumonia on XR. UA pending. Added CT C/A/P   2229 XR AP MOBILE CHEST  IMPRESSION:  Small left pleural effusion, similar to prior. No acute abnormality.     2248 URINALYSIS WITH REFLEX MICROSCOPIC AND CULTURE IF POSITIVE(!)  infected   2328 CTA CHEST FOR PULMONARY EMBOLUS AND CT ABD/PEL W IV CONTRAST  IMPRESSION:  1. No pulmonary embolism.  2. Patchy right lower lobe airspace disease.  3. Small left pleural effusion with associated left lower lobe atelectasis.  4. No acute intra-abdominal abnormality identified.       Critical Care Attestation:  As the ED physician, I have provided critical care for this patient.  This patient has high probability of imminent life or limb threatening deterioration due to acute respiratory failure.  I provided direct patient care, documentation of findings, review of medical records, discussion with patient/family, and crystalloid volume resuscitation and frequent reassessment.  Time spent providing critical care (exclusive of any billed procedures and/or teaching): 35 minutes.    Clinical Impression   Acute hypoxic respiratory failure (CMS HCC) (Primary)   Community acquired pneumonia   Acute cystitis      Patient will be admitted to the [facility] MEDICINE service for further workup and management.    Disposition: Admitted

## 2023-09-18 NOTE — ED Provider Notes (Signed)
 Department of Emergency Medicine  HPI - 09/18/2023    Attending: Dr. Omar Person  Chief Complaint:  Chief Complaint   Patient presents with    Shortness of Breath     Pt reports "I can't breath and my stomach hurts, I don't have the energy to do anything."    Weakness    Abdominal Pain     History of Present Illness:  [name] is a 89 y.o. female with a PMH of multiple medical problems who presents via POV walk-in accompanied by daughter with a chief complaint of decreased activity, decreased p.o. intake and shortness of breath.  Daughter states that is since June of this past year, patient has had episodes of needing to be in the hospital and some episodes of congestive heart failure.  She was diagnosed with COVID in the past couple of months.  She recovered well from that.  In the past couple of days, she has had decreased p.o. intake especially today.  She also also had malaise and fatigue.  No fevers.  No coughing.  No bowel changes.  Daughter also states that patient has had nausea but no vomiting.  Denies any chest pain or abdominal pain.  She told the daughter "I just can't breathe".  Daughter states that patient is DNR but she is okay with having a medical workup.  Family was concerned the patient might also be slightly jaundice.    History Limitations:  None.  Patient daughter provides most history.    Review of Systems:  Pertinent positive or negative ROS reflected in HPI      Allergies:  Allergies   Allergen Reactions    Lactose Rash    Grass Pollen      Past Medical History:  Past Medical History:   Diagnosis Date    CHF (congestive heart failure) (CMS HCC)     Fibromyalgia     Hypercholesterolemia     Thyroid disease          Past Surgical History:  Past Surgical History:   Procedure Laterality Date    HX BREAST LUMPECTOMY Right          Social History:  Social History     Tobacco Use    Smoking status: Never    Smokeless tobacco: Never   Substance Use Topics    Alcohol use: Never     Family  History:  Family Medical History:       Problem Relation (Age of Onset)    Hypertension (High Blood Pressure) Mother, Father            Physical Exam:  All nurse's notes reviewed.  Filed Vitals:    09/24/23 0023 09/24/23 0846 09/24/23 1027 09/24/23 1557   BP: 132/63 (!) 102/54  128/65   Pulse: 61 69  71   Resp: 19 18     Temp: 36.4 C (97.5 F)      SpO2: 100% 100% 92% 97%      Constitutional:  Elderly appearing.  No acute distress.  Alert and Oriented x3.  Cachectic.  HENT:   Head: Normocephalic, Atraumatic.    Mouth/Throat: Oropharynx is clear and somewhat dry.  Edentulous.  Eyes:Conjunctivae without discharge.  Neck: Trachea midline.   Cardiovascular: Regular rate and rhythm. No murmur  Pulmonary/Chest:  No respiratory distress. Lungs clear. No wheezing or rhonchi.  Abdominal:  Abdomen soft, mild upper and left upper tenderness, but no rebound or guarding.  Musculoskeletal: No obvious deformity, swelling. 2+ peripheral pulses  Skin: Warm and dry. No rash, erythema, pallor or cyanosis.  Patient does not appear jaundiced to me.  Psychiatric: Behavior is normal. Mood and affect congruent.  Neurological: Alert & Oriented x3. Grossly intact. Moves all extremities spontaneously.    Orders, Abnormal Labs and Imaging Results:  Results up to the Time the Disposition was Entered   BASIC METABOLIC PANEL - Abnormal; Notable for the following components:       Result Value    CHLORIDE 91 (*)     CO2 TOTAL 35 (*)     BUN 27 (*)     BUN/CREA RATIO 26 (*)     ESTIMATED GFR - FEMALE 54 (*)     All other components within normal limits   B-TYPE NATRIURETIC PEPTIDE (BNP),PLASMA - Abnormal; Notable for the following components:    BNP 283 (*)     All other components within normal limits   HEPATIC FUNCTION PANEL - Abnormal; Notable for the following components:    ALBUMIN 2.7 (*)     All other components within normal limits   PT/INR - Abnormal; Notable for the following components:    INR 1.17 (*)     All other components within  normal limits    Narrative:     Coumadin therapy INR range for Conventional Anticoagulation is 2.0 to 3.0 and for Intensive Anticoagulation 2.5 to 3.5.   BLOOD GAS - Abnormal; Notable for the following components:    PCO2 (VENOUS) 61 (*)     BICARBONATE (VENOUS) 33.4 (*)     BASE EXCESS 12.4 (*)     All other components within normal limits    Narrative:     Manufacturer does not recommend venous sample for assessment of patient oxygenation status. A reference range for pO2, Oxyhemoglobin and Oxygen Saturation is provided but abnormal results will not flag in Epic.   CBC WITH DIFF - Abnormal; Notable for the following components:    WBC 21.2 (*)     RBC 3.80 (*)     MCH 32.1 (*)     NEUTROPHIL # 19.37 (*)     LYMPHOCYTE # 0.59 (*)     IMMATURE GRANULOCYTE # 0.13 (*)     All other components within normal limits   URINALYSIS, MACRO/MICRO - Abnormal; Notable for the following components:    APPEARANCE Turbid (*)     LEUKOCYTES 500 (*)     PROTEIN 70 (*)     BLOOD Trace (*)     RBCS 5-10 (*)     WBCS 50-75 (*)     BACTERIA Few (*)     HYALINE CASTS Few (*)     All other components within normal limits   COVID-19, FLU A/B, RSV RAPID BY PCR - Normal    Narrative:     Results are for the simultaneous qualitative identification of SARS-CoV-2 (formerly 2019-nCoV), Influenza A, Influenza B, and RSV RNA. These etiologic agents are generally detectable in nasopharyngeal and nasal swabs during the ACUTE PHASE of infection. Hence, this test is intended to be performed on respiratory specimens collected from individuals with signs and symptoms of upper respiratory tract infection who meet Centers for Disease Control and Prevention (CDC) clinical and/or epidemiological criteria for Coronavirus Disease 2019 (COVID-19) testing. CDC COVID-19 criteria for testing on human specimens is available at [facility] webpage information for Healthcare Professionals: Coronavirus Disease 2019 (COVID-19)  ([email or web address]).     False-negative results may occur if the virus has genomic mutations, insertions, deletions, or  rearrangements or if performed very early in the course of illness. Otherwise, negative results indicate virus specific RNA targets are not detected, however negative results do not preclude SARS-CoV-2 infection/COVID-19, Influenza, or Respiratory syncytial virus infection. Results should not be used as the sole basis for patient management decisions. Negative results must be combined with clinical observations, patient history, and epidemiological information. If upper respiratory tract infection is still suspected based on exposure history together with other clinical findings, re-testing should be considered.    Test methodology:   Cepheid Xpert Xpress SARS-CoV-2/Flu/RSV Assay real-time polymerase chain reaction (RT-PCR) test on the GeneXpert Dx and Xpert Xpress systems.   LIPASE - Normal   LACTIC ACID LEVEL W/ REFLEX FOR LEVEL >2.0 - Normal   MAGNESIUM - Normal   PHOSPHORUS - Normal   TROPONIN-I - Normal   CBC/DIFF    Narrative:     The following orders were created for panel order CBC/DIFF.  Procedure                               Abnormality         Status                     ---------                               -----------         ------                     CBC WITH NUUV[[id number]]                Abnormal            Final result                 Please view results for these tests on the individual orders.   URINALYSIS WITH REFLEX MICROSCOPIC AND CULTURE IF POSITIVE    Narrative:     The following orders were created for panel order URINALYSIS WITH REFLEX MICROSCOPIC AND CULTURE IF POSITIVE.  Procedure                               Abnormality         Status                     ---------                               -----------         ------                     URINALYSIS, MACRO/MICRO[[id number]]      Abnormal            Final result                  Please view results for these tests on the individual orders.   XR AP MOBILE CHEST    Narrative:     Estalee L Bocanegra    PROCEDURE DESCRIPTION: XR AP MOBILE CHEST    CLINICAL INDICATION: dyspnea tachypnea    TECHNIQUE: 1 views / 1 images submitted.    COMPARISON: 08/09/2023  FINDINGS: Unchanged heart size. Small left pleural effusion, similar to prior examination. No right pleural effusion. No airspace opacity or pulmonary edema.      CTA CHEST FOR PULMONARY EMBOLUS AND CT ABD/PEL W IV CONTRAST    Narrative:     Laysha L Demarest    PROCEDURE DESCRIPTION: CTA CHEST FOR PULMONARY EMBOLUS AND CT ABD/PEL W IV CONTRAST    CTA chest was performed to evaluate for pulmonary embolism with 3D/MIP reconstructions generated.    CLINICAL INDICATION: hypoxic; upper abd pain with nausea/decreased PO intake, leukocytosis, eval for pneumonia, edema, PE or intraabd source of infection    COMPARISON: 08/07/2023      FINDINGS: Study is of good diagnostic quality.    No filling defects are identified within the pulmonary arteries. The main pulmonary artery is normal in size. The heart size is normal. There is no pericardial effusion. 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    Patchy opacity is present within the right lower lobe. Small left pleural effusion is present with associated left lower lobe atelectasis. Lungs are otherwise clear.    The liver is normal in appearance. No biliary ductal dilation is identified. The spleen, pancreas and adrenal glands are normal in appearance. Kidneys are normal in appearance with no hydronephrosis or stone.     The stomach, small bowel and colon are normal in appearance. Extensive stool burden is noted within the colon. There is no free air or fluid. The bladder is normal in appearance. There is no abdominopelvic adenopathy.    There is no acute osseous abnormality. Significant skeletal deformity and scoliosis are again noted with associated multilevel chronic  compression fractures.     ondansetron (ZOFRAN) 2 mg/mL injection (2 mg Intravenous Given 09/18/23 2143)   cefTRIAXone (ROCEPHIN) injection 1 g (1 g Intravenous Given 09/18/23 2337)   iopamidol (ISOVUE-370) 76% infusion (69 mL Intravenous Given 09/18/23 2302)   doxycycline hyclate (VIBRAMYCIN) capsule (has no administration in time range)   NS bolus infusion 250 mL (has no administration in time range)       EKG:  Reviewed by me  MDM:   Medical Decision Making  Amount and/or Complexity of Data Reviewed  Labs: ordered.  Radiology: ordered.  ECG/medicine tests: ordered.    Risk  Prescription drug management.  Decision regarding hospitalization.     Patient here with generalized symptoms.  Some nausea, malaise.  History of CHF.  Labs ordered and some test results pending at the time of sign-out.  Please see Dr. Shirline Frees documentation for continuation of patient's emergency department course.     Critical care time:  None by me  Consults:  None by me  Impression:   Clinical Impression   Acute hypoxic respiratory failure (CMS HCC) (Primary)   Community acquired pneumonia   Acute cystitis   The above impression(s) autopopulated after my encounter with the patient when finishing chart at a later time and clicking "refresh".  Some information including the impressions were not necessarily included in "real time" during my encounter upon sign-out.    Disposition:  Admitted    Sign-out: Care of patient will be transferred to Dr. Rebecca Eaton at this time.  They were made aware of history/physical, relevant labs/imaging and pending studies.    Follow Up:   No follow-up provider specified.  Prescriptions:   Current Discharge Medication List        START taking these medications    Details   guaiFENesin (MUCINEX) 600 mg Oral Tablet  Extended Release 12hr Take 1 Tablet (600 mg total) by mouth Twice daily for 30 days      ipratropium-albuterol 0.5 mg-3 mg(2.5 mg base)/3 mL Solution for Nebulization Take 3 mL by nebulization Four times a day for 30  days      nystatin (MYCOSTATIN) 100,000 unit/mL Oral Suspension Swish and Swallow 5 mL Four times a day      pantoprazole (PROTONIX) 40 mg Oral Tablet, Delayed Release (E.C.) Take 1 Tablet (40 mg total) by mouth Every morning before breakfast for 30 days                Future Appointments   Date Time Provider Department Center   03/10/2024 10:00 AM Ang[facility], Inocente Salles, MD [facility]       This note may have been partially generated using MModal Fluency Direct system, and there may be some incorrect words, spellings, and punctuation that were not noted in checking the note before saving.    Tamala Bari, MD  09/18/2023, [phone]

## 2023-09-19 ENCOUNTER — Encounter (HOSPITAL_COMMUNITY): Payer: Self-pay | Admitting: Internal Medicine

## 2023-09-19 DIAGNOSIS — K5909 Other constipation: Secondary | ICD-10-CM

## 2023-09-19 DIAGNOSIS — I420 Dilated cardiomyopathy: Secondary | ICD-10-CM

## 2023-09-19 DIAGNOSIS — I255 Ischemic cardiomyopathy: Secondary | ICD-10-CM

## 2023-09-19 DIAGNOSIS — Z681 Body mass index (BMI) 19 or less, adult: Secondary | ICD-10-CM

## 2023-09-19 DIAGNOSIS — J189 Pneumonia, unspecified organism: Secondary | ICD-10-CM | POA: Diagnosis present

## 2023-09-19 DIAGNOSIS — N390 Urinary tract infection, site not specified: Secondary | ICD-10-CM

## 2023-09-19 DIAGNOSIS — E46 Unspecified protein-calorie malnutrition: Secondary | ICD-10-CM

## 2023-09-19 LAB — LACTIC ACID LEVEL W/ REFLEX FOR LEVEL >2.0: LACTIC ACID: 1.1 mmol/L (ref 0.5–2.2)

## 2023-09-19 MED ORDER — SODIUM CHLORIDE 0.9 % IV BOLUS
250.0000 mL | INJECTION | Status: AC
Start: 2023-09-19 — End: 2023-09-19
  Administered 2023-09-19: 0 mL via INTRAVENOUS
  Administered 2023-09-19: 250 mL via INTRAVENOUS

## 2023-09-19 MED ORDER — DOXYCYCLINE HYCLATE 100 MG CAPSULE
100.0000 mg | ORAL_CAPSULE | Freq: Two times a day (BID) | ORAL | Status: AC
Start: 2023-09-19 — End: 2023-09-24
  Administered 2023-09-19 – 2023-09-23 (×10): 100 mg via ORAL
  Filled 2023-09-19 (×10): qty 1

## 2023-09-19 MED ORDER — LEVOTHYROXINE 75 MCG TABLET
75.0000 ug | ORAL_TABLET | Freq: Every morning | ORAL | Status: DC
Start: 2023-09-19 — End: 2023-09-24
  Administered 2023-09-19 – 2023-09-24 (×6): 75 ug via ORAL
  Filled 2023-09-19 (×6): qty 1

## 2023-09-19 MED ORDER — ENOXAPARIN 30 MG/0.3 ML SUBCUTANEOUS SYRINGE
30.0000 mg | INJECTION | SUBCUTANEOUS | Status: DC
Start: 2023-09-19 — End: 2023-09-24
  Administered 2023-09-19 – 2023-09-24 (×6): 30 mg via SUBCUTANEOUS
  Filled 2023-09-19 (×6): qty 0.3

## 2023-09-19 MED ORDER — SODIUM CHLORIDE 0.9 % (FLUSH) INJECTION SYRINGE
3.0000 mL | INJECTION | Freq: Three times a day (TID) | INTRAMUSCULAR | Status: DC
Start: 2023-09-19 — End: 2023-09-24
  Administered 2023-09-19 – 2023-09-20 (×3): 0 mL
  Administered 2023-09-20 (×2): 3 mL
  Administered 2023-09-21: 0 mL
  Administered 2023-09-21 – 2023-09-22 (×4): 3 mL
  Administered 2023-09-22 – 2023-09-23 (×2): 0 mL
  Administered 2023-09-23 (×2): 3 mL
  Administered 2023-09-24: 0 mL
  Administered 2023-09-24: 3 mL

## 2023-09-19 MED ORDER — GUAIFENESIN ER 600 MG TABLET, EXTENDED RELEASE 12 HR
600.0000 mg | EXTENDED_RELEASE_TABLET | Freq: Two times a day (BID) | ORAL | Status: DC
Start: 2023-09-19 — End: 2023-09-24
  Administered 2023-09-19 – 2023-09-24 (×11): 600 mg via ORAL
  Filled 2023-09-19 (×11): qty 1

## 2023-09-19 MED ORDER — ATORVASTATIN 40 MG TABLET
40.0000 mg | ORAL_TABLET | Freq: Every evening | ORAL | Status: DC
Start: 2023-09-19 — End: 2023-09-24
  Administered 2023-09-19 – 2023-09-23 (×5): 40 mg via ORAL
  Filled 2023-09-19 (×5): qty 1

## 2023-09-19 MED ORDER — DEXTROSE 5% IN WATER (D5W) FLUSH BAG - 250 ML
INTRAVENOUS | Status: DC | PRN
Start: 2023-09-19 — End: 2023-09-24

## 2023-09-19 MED ORDER — CEFTRIAXONE 2 GRAM SOLUTION FOR INJECTION - IV DEFAULT
2.0000 g | INTRAMUSCULAR | Status: DC
Start: 2023-09-19 — End: 2023-09-24
  Administered 2023-09-19 – 2023-09-24 (×6): 2 g via INTRAVENOUS
  Filled 2023-09-19 (×6): qty 20

## 2023-09-19 MED ORDER — CARVEDILOL 3.125 MG TABLET
3.1250 mg | ORAL_TABLET | Freq: Every day | ORAL | Status: DC
Start: 2023-09-19 — End: 2023-09-24
  Administered 2023-09-19: 3.125 mg via ORAL
  Administered 2023-09-20 – 2023-09-22 (×3): 0 mg via ORAL
  Administered 2023-09-23 – 2023-09-24 (×2): 3.125 mg via ORAL
  Filled 2023-09-19 (×5): qty 1

## 2023-09-19 MED ORDER — IPRATROPIUM 0.5 MG-ALBUTEROL 3 MG (2.5 MG BASE)/3 ML NEBULIZATION SOLN
3.0000 mL | INHALATION_SOLUTION | Freq: Four times a day (QID) | RESPIRATORY_TRACT | Status: DC
Start: 2023-09-19 — End: 2023-09-24
  Administered 2023-09-19: 0 mL via RESPIRATORY_TRACT
  Administered 2023-09-19 – 2023-09-22 (×14): 3 mL via RESPIRATORY_TRACT
  Administered 2023-09-23: 0 mL via RESPIRATORY_TRACT
  Administered 2023-09-23: 3 mL via RESPIRATORY_TRACT
  Administered 2023-09-23: 0 mL via RESPIRATORY_TRACT
  Administered 2023-09-23: 3 mL via RESPIRATORY_TRACT
  Administered 2023-09-24: 0 mL via RESPIRATORY_TRACT
  Administered 2023-09-24: 3 mL via RESPIRATORY_TRACT
  Administered 2023-09-24: 0 mL via RESPIRATORY_TRACT

## 2023-09-19 MED ORDER — ACETAMINOPHEN 325 MG TABLET
650.0000 mg | ORAL_TABLET | Freq: Four times a day (QID) | ORAL | Status: DC | PRN
Start: 2023-09-19 — End: 2023-09-24
  Administered 2023-09-23: 650 mg via ORAL
  Filled 2023-09-19: qty 2

## 2023-09-19 MED ORDER — ASPIRIN 81 MG TABLET,DELAYED RELEASE
81.0000 mg | DELAYED_RELEASE_TABLET | Freq: Every day | ORAL | Status: DC
Start: 2023-09-19 — End: 2023-09-24
  Administered 2023-09-19 – 2023-09-24 (×6): 81 mg via ORAL
  Filled 2023-09-19 (×6): qty 1

## 2023-09-19 MED ORDER — ONDANSETRON HCL (PF) 4 MG/2 ML INJECTION SOLUTION
4.0000 mg | Freq: Four times a day (QID) | INTRAMUSCULAR | Status: DC | PRN
Start: 2023-09-19 — End: 2023-09-24
  Administered 2023-09-22: 4 mg via INTRAVENOUS
  Filled 2023-09-19: qty 2

## 2023-09-19 MED ORDER — SIMETHICONE 80 MG CHEWABLE TABLET
160.0000 mg | CHEWABLE_TABLET | Freq: Four times a day (QID) | ORAL | Status: DC
Start: 2023-09-19 — End: 2024-04-09
  Administered 2023-09-19 – 2023-09-23 (×16): 160 mg via ORAL
  Administered 2023-09-23: 0 mg via ORAL
  Administered 2023-09-23 – 2023-09-24 (×3): 160 mg via ORAL
  Administered 2023-09-24: 0 mg via ORAL
  Filled 2023-09-19 (×19): qty 2

## 2023-09-19 MED ORDER — LACTULOSE 10 GRAM/15 ML ORAL SOLUTION
30.0000 mL | Freq: Two times a day (BID) | ORAL | Status: DC
Start: 2023-09-19 — End: 2023-09-24
  Administered 2023-09-19 – 2023-09-24 (×11): 30 mL via ORAL
  Filled 2023-09-19 (×11): qty 30

## 2023-09-19 MED ORDER — LISINOPRIL 5 MG TABLET
2.5000 mg | ORAL_TABLET | Freq: Every day | ORAL | Status: DC
Start: 2023-09-19 — End: 2023-09-24
  Administered 2023-09-19: 2.5 mg via ORAL
  Administered 2023-09-22: 0 mg via ORAL
  Filled 2023-09-19: qty 1

## 2023-09-19 MED ORDER — FUROSEMIDE 40 MG TABLET
40.0000 mg | ORAL_TABLET | Freq: Two times a day (BID) | ORAL | Status: DC
Start: 2023-09-19 — End: 2024-07-11
  Administered 2023-09-19: 0 mg via ORAL
  Administered 2023-09-19: 40 mg via ORAL
  Administered 2023-09-22: 0 mg via ORAL
  Filled 2023-09-19 (×3): qty 1

## 2023-09-19 MED ORDER — POTASSIUM CHLORIDE ER 10 MEQ TABLET,EXTENDED RELEASE
10.0000 meq | ORAL_TABLET | Freq: Two times a day (BID) | ORAL | Status: DC
Start: 2023-09-19 — End: 2024-08-07
  Administered 2023-09-19: 10 meq via ORAL
  Administered 2023-09-20: 0 meq via ORAL
  Administered 2023-09-22 – 2023-09-24 (×4): 10 meq via ORAL
  Filled 2023-09-19 (×6): qty 1

## 2023-09-19 MED ORDER — SODIUM CHLORIDE 0.9 % (FLUSH) INJECTION SYRINGE
3.0000 mL | INJECTION | INTRAMUSCULAR | Status: DC | PRN
Start: 2023-09-19 — End: 2023-09-24

## 2023-09-19 MED ORDER — SODIUM CHLORIDE 0.9% FLUSH BAG - 250 ML
INTRAVENOUS | Status: DC | PRN
Start: 2023-09-19 — End: 2023-09-24

## 2023-09-19 MED ORDER — METOCLOPRAMIDE 5 MG TABLET
5.0000 mg | ORAL_TABLET | Freq: Three times a day (TID) | ORAL | Status: DC
Start: 2023-09-19 — End: 2024-04-09
  Administered 2023-09-19 – 2023-09-24 (×16): 5 mg via ORAL
  Filled 2023-09-19 (×16): qty 1

## 2023-09-19 MED ORDER — SENNOSIDES 8.6 MG-DOCUSATE SODIUM 50 MG TABLET
1.0000 | ORAL_TABLET | Freq: Two times a day (BID) | ORAL | Status: DC
Start: 2023-09-19 — End: 2024-06-26
  Administered 2023-09-19 – 2023-09-24 (×11): 1 via ORAL
  Filled 2023-09-19 (×11): qty 1

## 2023-09-19 NOTE — Nurses Notes (Signed)
 Patient is alert and oriented but hard of hearing. Patient ambulates with a steady x1. Patient sat in the chair for the majority of the day. Patient had daughter at bedside. Patient was seen by Dr. Jules Schick at bedside. Patient has been free from falls.    Larena Sox, RN

## 2023-09-19 NOTE — H&P (Signed)
 General Medicine  Admission H&P    Date of Service:  09/19/2023  [name], [name], 89 y.o. female  Date of Admission:  09/18/2023  Date of Birth:  05-20-1935  PCP: Marylee Floras, MD    LAY CAREGIVER   Appointed Lay Caregiver?: Yes  Lay Caregiver Name: [name]  Lay Caregiver Relationship to patient: child  Lay Caregiver Contact Number: [phone]     Information Obtained from: patient and history reviewed via medical record  Chief Complaint:  Malaise weakness    HPI: [name] is a 89 y.o., White female who presents with proximally 5 days progressively worsening malaise and weakness.  Evaluated in the emergency room.  Found to have right lower lobe pneumonia.  Found to have urinary tract infection.  Patient did have vague abdominal pain suprapubic in nature.  Denies any dysuria or frequency however.  No flank tenderness.    Denies any documented fever chills or night sweats.  Denies cough sputum hemoptysis.    Patient was hypoxic.    PAST MEDICAL:    Past Medical History:   Diagnosis Date    CHF (congestive heart failure) (CMS HCC)     Fibromyalgia     Hypercholesterolemia     Thyroid disease         Past Surgical History:   Procedure Laterality Date    HX BREAST LUMPECTOMY Right             Medications Prior to Admission       Prescriptions    aspirin (ECOTRIN) 81 mg Oral Tablet, Delayed Release (E.C.)    Take 1 Tablet (81 mg total) by mouth Once a day for 360 days    atorvastatin (LIPITOR) 40 mg Oral Tablet    Take 1 Tablet (40 mg total) by mouth Every evening    carvediloL (COREG) 3.125 mg Oral Tablet    Take 1 Tablet (3.125 mg total) by mouth Once a day for 360 days    furosemide (LASIX) 40 mg Oral Tablet    Take 1 Tablet (40 mg total) by mouth Twice daily    Patient taking differently:  Take 0.5 Tablets (20 mg total) by mouth Twice daily    lactulose (ENULOSE) 20 gram/30 mL Oral Solution    Take 30 mL by mouth Three times a day for 846 doses    levothyroxine (SYNTHROID) 75 mcg Oral Tablet    Take  1 Tablet (75 mcg total) by mouth Every morning    lidocaine 4 % Adhesive Patch, Medicated    Place 1 Patch on the skin Once a day    lisinopriL (PRINIVIL) 2.5 mg Oral Tablet    Take 1 Tablet (2.5 mg total) by mouth Once a day for 360 days    metoclopramide HCl (REGLAN) 5 mg Oral Tablet    Take 1 Tablet (5 mg total) by mouth Three times daily before meals    potassium chloride (KLOR-CON) 10 mEq Oral Tablet Sustained Release    Take 1 Tablet (10 mEq total) by mouth Twice daily with food for 360 days    sennosides-docusate sodium (SENOKOT-S) 8.6-50 mg Oral Tablet    Take 1 Tablet by mouth Twice daily for 360 days    simethicone (MYLICON) 80 mg Oral Tablet, Chewable    Chew and swallow 2 Tablets (160 mg total) by mouth Four times a day          Allergies   Allergen Reactions    Lactose Rash  Grass Pollen        Family History  Family Medical History:       Problem Relation (Age of Onset)    Hypertension (High Blood Pressure) Mother, Father               Social History  Social History     Tobacco Use    Smoking status: Never    Smokeless tobacco: Never   Substance Use Topics    Alcohol use: Never        ROS:  Remainder review of systems was negative were appear to be at baseline and noncontributory    Examination:  Temperature: 36.4 C (97.5 F) Heart Rate: 73 BP (Non-Invasive): (!) 93/43   Respiratory Rate: 16 SpO2: 98 %       Vitals: BP (!) 93/43   Pulse 73   Temp 36.4 C (97.5 F)   Resp 16   Ht 1.575 m (5\' 2" )   Wt (!) 30.8 kg (67 lb 14.4 oz)   LMP  (LMP Unknown)   SpO2 98%   BMI 12.42 kg/m       General: no distress  Eyes: Conjunctiva clear., Sclera non-icteric.   HENT:  Lungs:  No bronchospasm but crackles bilaterally right worse than the left.  Cardiovascular:    Heart regular rate and rhythm, S1, S2 normal, no murmur, click, rub or gallop and    Vascular   pulses 1+ throughout  Abdomen: soft, non-tender, bowel sounds normal, and some suprapubic tenderness without guarding or rebound  Extremities: no  cyanosis or edema  Skin: Skin warm and dry  Neurologic: grossly normal  Psychiatric: AOx3   Labs:    Lab Results Today:    Results for orders placed or performed during the hospital encounter of 09/18/23 (from the past 24 hours)   ECG 12 LEAD - ED USE   Result Value Ref Range    Ventricular rate 85 BPM    Atrial Rate 85 BPM    PR Interval 232 ms    QRS Duration 130 ms    QT Interval 464 ms    QTC Calculation 552 ms    Calculated P Axis 27 degrees    Calculated R Axis -42 degrees    Calculated T Axis 121 degrees   BASIC METABOLIC PANEL   Result Value Ref Range    SODIUM 136 136 - 145 mmol/L    POTASSIUM 3.7 3.5 - 5.1 mmol/L    CHLORIDE 91 (L) 96 - 111 mmol/L    CO2 TOTAL 35 (H) 23 - 31 mmol/L    ANION GAP 10 4 - 13 mmol/L    CALCIUM 9.1 8.6 - 10.3 mg/dL    GLUCOSE 161 65 - 096 mg/dL    BUN 27 (H) 8 - 25 mg/dL    CREATININE 0.45 4.09 - 1.05 mg/dL    BUN/CREA RATIO 26 (H) 6 - 22    ESTIMATED GFR - FEMALE 54 (L) >=60 mL/min/BSA   B-TYPE NATRIURETIC PEPTIDE   Result Value Ref Range    BNP 283 (H) 0 - 100 pg/mL   HEPATIC FUNCTION PANEL   Result Value Ref Range    ALBUMIN 2.7 (L) 3.4 - 4.8 g/dL     ALKALINE PHOSPHATASE 103 55 - 145 U/L    ALT (SGPT) 9 8 - 22 U/L    AST (SGOT)  14 8 - 45 U/L    BILIRUBIN TOTAL 0.5 0.3 - 1.3 mg/dL    BILIRUBIN DIRECT 0.3  0.1 - 0.4 mg/dL    PROTEIN TOTAL 6.5 6.0 - 8.0 g/dL   LIPASE   Result Value Ref Range    LIPASE 19 10 - 60 U/L   LACTIC ACID W/ REFLEX FOR >2.0   Result Value Ref Range    LACTIC ACID 1.4 0.5 - 2.2 mmol/L   MAGNESIUM   Result Value Ref Range    MAGNESIUM 2.2 1.8 - 2.6 mg/dL   PHOSPHORUS   Result Value Ref Range    PHOSPHORUS 3.6 2.3 - 4.0 mg/dL   PT/INR   Result Value Ref Range    INR 1.17 (H) 0.80 - 1.10   TROPONIN-I   Result Value Ref Range    TROPONIN-I HS 2.9 <=14.0 ng/L ng/L   BLOOD GAS Venous   Result Value Ref Range    %FIO2 (VENOUS) 21.0 %    PH (VENOUS) 7.42 7.32 - 7.43    PCO2 (VENOUS) 61 (H) 41 - 51 mm/Hg    PO2 (VENOUS) 28 35 - 50 mm/Hg    BICARBONATE (VENOUS)  33.4 (H) 22.0 - 29.0 mmol/L    BASE EXCESS 12.4 (H) 0.0 - 3.0 mmol/L    O2 SATURATION (VENOUS) 54.3 40.0 - 85.0 %   CBC WITH DIFF   Result Value Ref Range    WBC 21.2 (H) 3.7 - 11.0 x10^3/uL    RBC 3.80 (L) 3.85 - 5.22 x10^6/uL    HGB 12.2 11.5 - 16.0 g/dL    HCT 09.8 11.9 - 14.7 %    MCV 95.5 78.0 - 100.0 fL    MCH 32.1 (H) 26.0 - 32.0 pg    MCHC 33.6 31.0 - 35.5 g/dL    RDW-CV 82.9 56.2 - 13.0 %    PLATELETS 321 150 - 400 x10^3/uL    MPV 9.4 8.7 - 12.5 fL    NEUTROPHIL % 91.6 %    LYMPHOCYTE % 2.8 %    MONOCYTE % 4.8 %    EOSINOPHIL % 0.0 %    BASOPHIL % 0.2 %    NEUTROPHIL # 19.37 (H) 1.50 - 7.70 x10^3/uL    LYMPHOCYTE # 0.59 (L) 1.00 - 4.80 x10^3/uL    MONOCYTE # 1.02 0.20 - 1.10 x10^3/uL    EOSINOPHIL # <0.10 <=0.50 x10^3/uL    BASOPHIL # <0.10 <=0.20 x10^3/uL    IMMATURE GRANULOCYTE % 0.6 0.0 - 1.0 %    IMMATURE GRANULOCYTE # 0.13 (H) <0.10 x10^3/uL   COVID-19, FLU A/B, RSV RAPID BY PCR   Result Value Ref Range    SARS-CoV-2 Not Detected Not Detected    INFLUENZA VIRUS TYPE A Not Detected Not Detected    INFLUENZA VIRUS TYPE B Not Detected Not Detected    RESPIRATORY SYNCTIAL VIRUS (RSV) Not Detected Not Detected   URINALYSIS, MACRO/MICRO   Result Value Ref Range    COLOR Normal (Yellow) Normal (Yellow)    APPEARANCE Turbid (A) Clear    SPECIFIC GRAVITY 1.013 1.005 - 1.030    PH 5.5 5.0 - 8.0    LEUKOCYTES 500 (A) Negative WBCs/uL    NITRITE Negative Negative    PROTEIN 70 (A) Negative, 10 , 20  mg/dL    GLUCOSE Normal Normal mg/dL    KETONES Negative Negative mg/dL    UROBILINOGEN Normal Normal mg/dL    BILIRUBIN Negative Negative mg/dL    BLOOD Trace (A) Negative mg/dL    RBCS 8-65 (A) 0-2, None /hpf    WBCS 50-75 (A) 0-2, None /hpf    BACTERIA  Few (A) None, Rare, Occasional, Occasional or less /hpf    SQUAMOUS EPITHELIAL Rare /hpf    MUCOUS Rare /hpf    HYALINE CASTS Few (A) None, Rare /lpf   LACTIC ACID LEVEL W/ REFLEX FOR LEVEL >2.0   Result Value Ref Range    LACTIC ACID 1.1 0.5 - 2.2 mmol/L        Imaging Studies:  Reviewed  Results for orders placed or performed during the hospital encounter of 09/18/23 (from the past 24 hours)   XR AP MOBILE CHEST     Status: None    Narrative    Telesa L Viscuso    PROCEDURE DESCRIPTION: XR AP MOBILE CHEST    CLINICAL INDICATION: dyspnea tachypnea    TECHNIQUE: 1 views / 1 images submitted.    COMPARISON: 08/09/2023      FINDINGS: Unchanged heart size. Small left pleural effusion, similar to prior examination. No right pleural effusion. No airspace opacity or pulmonary edema.       Impression    Small left pleural effusion, similar to prior. No acute abnormality.                Radiologist location ID: [id number]     CTA CHEST FOR PULMONARY EMBOLUS AND CT ABD/PEL W IV CONTRAST     Status: None    Narrative    Nadea L Rapozo    PROCEDURE DESCRIPTION: CTA CHEST FOR PULMONARY EMBOLUS AND CT ABD/PEL W IV CONTRAST    CTA chest was performed to evaluate for pulmonary embolism with 3D/MIP reconstructions generated.    CLINICAL INDICATION: hypoxic; upper abd pain with nausea/decreased PO intake, leukocytosis, eval for pneumonia, edema, PE or intraabd source of infection    COMPARISON: 08/07/2023      FINDINGS: Study is of good diagnostic quality.    No filling defects are identified within the pulmonary arteries. The main pulmonary artery is normal in size. The heart size is normal. There is no pericardial effusion. 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    Patchy opacity is present within the right lower lobe. Small left pleural effusion is present with associated left lower lobe atelectasis. Lungs are otherwise clear.    The liver is normal in appearance. No biliary ductal dilation is identified. The spleen, pancreas and adrenal glands are normal in appearance. Kidneys are normal in appearance with no hydronephrosis or stone.     The stomach, small bowel and colon are normal in appearance. Extensive stool burden is noted within the colon. There is  no free air or fluid. The bladder is normal in appearance. There is no abdominopelvic adenopathy.    There is no acute osseous abnormality. Significant skeletal deformity and scoliosis are again noted with associated multilevel chronic compression fractures.      Impression    1. No pulmonary embolism.  2. Patchy right lower lobe airspace disease.  3. Small left pleural effusion with associated left lower lobe atelectasis.  4. No acute intra-abdominal abnormality identified.            The CT exam was performed using one or more of the following dose reduction techniques: Automated exposure control, adjustment of the mA and/or kV according to the patient's size, or use of iterative reconstruction technique.        Radiologist location ID: [id number]         DNR Status:  FULL CODE: ATTEMPT RESUSCITATION/CPR    Assessment/Plan:   Active Hospital Problems  Diagnosis    Primary Problem: Pneumonia     1. Community-acquired pneumonia.  Right lower lobe.  Patient is started on Rocephin and doxycycline in the emergency room.  Continue these antibiotics but follow up on cultures.      2. Urinary tract infection.  Continue Rocephin.  Follow up on culture results    3. Protein calorie malnutrition.  Supplement with ensure     4. 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  Monitor fluid status closely.    5. Chronic constipation.  Continue medications from home and monitor.          Marylee Floras, MD 09/19/2023 11:32

## 2023-09-19 NOTE — ED Nurses Note (Signed)
 Report called to [place], [address] [place], room 30. No questions at current time.

## 2023-09-19 NOTE — Care Plan (Signed)
 Problem: Wound  Goal: Optimal Coping  Outcome: Ongoing (see interventions/notes)  Intervention: Support Patient and Family Response  Recent Flowsheet Documentation  Taken 09/19/2023 0510 by Estil Daft, GN  Family/Support System Care:   self-care encouraged   presence promoted  Goal: Optimal Functional Ability  Outcome: Ongoing (see interventions/notes)  Goal: Absence of Infection Signs and Symptoms  Outcome: Ongoing (see interventions/notes)  Intervention: Prevent or Manage Infection  Recent Flowsheet Documentation  Taken 09/19/2023 0510 by Estil Daft, GN  Infection Management: aseptic technique maintained  Taken 09/19/2023 0505 by Estil Daft, GN  Fever Reduction/Comfort Measures:   lightweight clothing   lightweight bedding  Goal: Improved Oral Intake  Outcome: Ongoing (see interventions/notes)  Goal: Optimal Pain Control and Function  Outcome: Ongoing (see interventions/notes)  Intervention: Prevent or Manage Pain  Recent Flowsheet Documentation  Taken 09/19/2023 0505 by Estil Daft, GN  Sleep/Rest Enhancement: awakenings minimized  Goal: Skin Health and Integrity  Outcome: Ongoing (see interventions/notes)  Intervention: Optimize Skin Protection  Recent Flowsheet Documentation  Taken 09/19/2023 0510 by Estil Daft, GN  Head of Bed [facility]) Positioning: HOB elevated  Taken 09/19/2023 0505 by Estil Daft, GN  Pressure Reduction Techniques: Frequent weight shifting encouraged  Pressure Reduction Devices:   Sacral dressing utilized if not incontinent of bowel   Pressure redistributing mattress utilized  Goal: Optimal Wound Healing  Outcome: Ongoing (see interventions/notes)  Intervention: Promote Wound Healing  Recent Flowsheet Documentation  Taken 09/19/2023 0505 by Estil Daft, GN  Pressure Reduction Techniques: Frequent weight shifting encouraged  Pressure Reduction Devices:   Sacral dressing utilized if not incontinent of bowel   Pressure redistributing mattress utilized  Sleep/Rest Enhancement: awakenings minimized     Problem: Health Knowledge,  Opportunity to Enhance (Adult,Obstetrics,Pediatric)  Goal: Knowledgeable about Health Subject/Topic  Description: Patient will demonstrate the desired outcomes by discharge/transition of care.  Outcome: Ongoing (see interventions/notes)  Intervention: Enhance Health Knowledge  Recent Flowsheet Documentation  Taken 09/19/2023 0510 by Estil Daft, GN  Family/Support System Care:   self-care encouraged   presence promoted     Problem: Fall Injury Risk  Goal: Absence of Fall and Fall-Related Injury  Outcome: Ongoing (see interventions/notes)  Intervention: Identify and Manage Contributors  Recent Flowsheet Documentation  Taken 09/19/2023 0510 by Estil Daft, GN  Self-Care Promotion:   independence encouraged   BADL personal objects within reach  Medication Review/Management: medications reviewed  Intervention: Promote Injury-Free Environment  Recent Flowsheet Documentation  Taken 09/19/2023 0510 by Estil Daft, GN  Safety Promotion/Fall Prevention:   activity supervised   nonskid shoes/slippers when out of bed   safety round/check completed   fall prevention program maintained     Problem: Skin Injury Risk Increased  Goal: Skin Health and Integrity  Outcome: Ongoing (see interventions/notes)  Intervention: Optimize Skin Protection  Recent Flowsheet Documentation  Taken 09/19/2023 0510 by Estil Daft, GN  Head of Bed [facility]) Positioning: HOB elevated  Taken 09/19/2023 0505 by Estil Daft, GN  Pressure Reduction Techniques: Frequent weight shifting encouraged  Pressure Reduction Devices:   Sacral dressing utilized if not incontinent of bowel   Pressure redistributing mattress utilized

## 2023-09-19 NOTE — Care Plan (Signed)
 Patient admitted to the floor for hypoxic acute respiratory failure and an UTI. Patient alert and oriented for the shift but hard of hearing. Patient had their call bell near them and remained free from falls. Vitals remained stable for the shift. Patient had no complaints of pain. I have reviewed this patient's orders and plan of care. Currently this patient meets requirements for a low to mid level of nursing care.  [name], GN

## 2023-09-19 NOTE — Ancillary Notes (Signed)
 Medical Nutrition Therapy Assessment        SUBJECTIVE : [name] is a 89 y.o., White female who presents with proximally 5 days progressively worsening malaise and weakness.  Evaluated in the emergency room.  Found to have right lower lobe pneumonia and urinary tract infection.     Patient sitting up in chair at time of visit.  No family present.  Patient reports she ate a good breakfast(100%), but only consumed 25% of lunch.   Reminded RD she needs pureed foods.  Weight loss per wt history is 4# 5.6% x 6 weeks and 10# x 2 mo 14%.  Does not like chocolate, but agreeable to supplement of another flavor.  NFPE was performed - see below.  Labs noted.      OBJECTIVE:     Current Diet Order/Nutrition Support:  DIET CARDIAC (2G NA, LOW FAT, LOW CHOL, LOW CAFFEINE) Consistency/thickening: Solid: PUREE  MNT PROTOCOL FOR DIETITIAN  DIETARY ORAL SUPPLEMENTS Product Name: Mighty Shake-Chocolate; BREAKFAST &2PM; 1 Each   Height Used for Calculations: 157.5 cm (5' 2.01")  Weight Used For Calculations: 30.8 kg (67 lb 14.4 oz)  BMI (kg/m2): 12.44  BMI Assessment: less than 18.5 - underweight  Ideal Body Weight (IBW) (kg): 50.45  % Ideal Body Weight: 61.05             Weight         09/18/2023  1911 09/18/2023  2128 09/19/2023  0513       Weight: 32.7 kg (72 lb) 31.7 kg (69 lb 14.2 oz) 30.8 kg (67 lb 14.4 oz)           Wt Readings from Last 20 Encounters:   09/19/23 (!) 30.8 kg (67 lb 14.4 oz)   08/07/23 (!) 32.7 kg (72 lb 1.5 oz)   07/12/23 (!) 33.1 kg (72 lb 15.6 oz)   06/29/23 (!) 35.5 kg (78 lb 4.2 oz)   05/22/23 (!) 33.2 kg (73 lb 3.1 oz)   04/21/23 (!) 31.3 kg (69 lb)   04/13/23 (!) 31 kg (68 lb 5.5 oz)   04/09/23 (!) 31.8 kg (70 lb)   04/01/23 (!) 31.3 kg (69 lb 0.1 oz)   03/10/23 (!) 31.8 kg (70 lb)   02/20/23 (!) 31.3 kg (69 lb 0.1 oz)   02/12/23 (!) 31.2 kg (68 lb 12.8 oz)   02/04/23 (!) 30.7 kg (67 lb 10.9 oz)   01/29/23 (!) 32 kg (70 lb 8.8 oz)   09/08/22 (!) 33.6 kg (74 lb)   03/05/22 (!) 31.8 kg (70 lb)   09/04/21  (!) 34.5 kg (76 lb)   04/22/21 (!) 33.7 kg (74 lb 4.7 oz)   02/28/21 (!) 34.4 kg (75 lb 12.8 oz)   08/30/20 (!) 37.2 kg (82 lb)     Comprehensive Metabolic Profile    Lab Results   Component Value Date/Time    SODIUM 136 09/18/2023 09:30 PM    POTASSIUM 3.7 09/18/2023 09:30 PM    CHLORIDE 91 (L) 09/18/2023 09:30 PM    CO2 35 (H) 09/18/2023 09:30 PM    ANIONGAP 10 09/18/2023 09:30 PM    BUN 27 (H) 09/18/2023 09:30 PM    CREATININE 1.02 09/18/2023 09:30 PM    GLUCOSE Normal 09/18/2023 10:24 PM    Lab Results   Component Value Date/Time    CALCIUM 9.1 09/18/2023 09:30 PM    PHOSPHORUS 3.6 09/18/2023 09:30 PM    ALBUMIN 2.7 (L) 09/18/2023 09:30 PM    TOTALPROTEIN  6.5 09/18/2023 09:30 PM    ALKPHOS 103 09/18/2023 09:30 PM    AST 14 09/18/2023 09:30 PM    ALT 9 09/18/2023 09:30 PM    TOTBILIRUBIN 0.5 09/18/2023 09:30 PM        MAGNESIUM  Lab Results   Component Value Date    MAGNESIUM 2.2 09/18/2023     Current Facility-Administered Medications   Medication Dose Route Frequency    acetaminophen (TYLENOL) tablet  650 mg Oral Q6H PRN    aspirin (ECOTRIN) enteric coated tablet 81 mg  81 mg Oral Daily    atorvastatin (LIPITOR) tablet  40 mg Oral QPM    carvedilol (COREG) tablet  3.125 mg Oral Daily    cefTRIAXone (ROCEPHIN) injection 2 g  2 g Intravenous Q24H    D5W 250 mL flush bag   Intravenous Q15 Min PRN    doxycycline hyclate (VIBRAMYCIN) capsule  100 mg Oral 2x/day    enoxaparin PF (LOVENOX) 30 mg/0.3 mL SubQ injection  30 mg Subcutaneous Q24H    furosemide (LASIX) tablet  40 mg Oral 2x/day    guaiFENesin (MUCINEX) extended release tablet - for cough (expectorant)  600 mg Oral 2x/day    ipratropium-albuterol 0.5 mg-3 mg(2.5 mg base)/3 mL Solution for Nebulization  3 mL Nebulization 4x/day    lactulose (ENULOSE) 10g per 15mL oral liquid  30 mL Oral 2x/day    levothyroxine (SYNTHROID) tablet  75 mcg Oral QAM    lisinopril (PRINIVIL) tablet  2.5 mg Oral Daily    metoclopramide (REGLAN) tablet  5 mg Oral 3x/day AC    NS 250  mL flush bag   Intravenous Q15 Min PRN    NS flush syringe  3 mL Intracatheter Q8HRS    NS flush syringe  3 mL Intracatheter Q1H PRN    ondansetron (ZOFRAN) 2 mg/mL injection  4 mg Intravenous Q6H PRN    potassium chloride (KCLOR-CON) extended release tablet  10 mEq Oral 2x/day-Food    sennosides-docusate sodium (SENOKOT-S) 8.6-50mg  per tablet  1 Tablet Oral 2x/day    simethicone (MYLICON) chewable tablet  160 mg Oral 4x/day     Physical Assessment: no noted edema, pressure injury to upper back and coccyx per nursing flowsheet  LBM: 3/1 per patient statement  A nutrition-focused physical exam was performed.  Results:  Subcutaneous fat loss (3+): orbital 3+, cheek 3+, tricep 3+  Muscle mass loss (3+): temple 3+, clavicle 3+, shoulder 3+, scapula n/a, hand 3+, thigh 2+, calf 3+      Estimated Needs:    Energy Calorie Requirements: 1100 - 1500 per day (35 -40 kcals/30.8kg)  Protein Requirements (gms/day): 40 - 46 per day (1.3 -1.5g/30.8kg)  Fluid Requirements: 1100 per day (35 mLs/30.8kg)        Plan/Interventions :   Recommend changing diet to Regular pureed    Send Ensure Plus Strawberry BID  -adjust order and frequency as needed    Daily weights  -trend weight    If appetite inadequate recommend trial of appetite stimulant    Monitor labs/wt and oral intake  RD avail as needed.    Nutrition Diagnosis:     Unspecified severe protein-calorie malnutrition  Related to: Inadequate protein and enregy intake   As evidenced by: Energy Intake: Less than 75% of EER in greater than or equal to 3 months, Weight Loss:  (4# 5.6% x 6 weeks and 10# x 2 mo 14%.), Body Fat Loss: Severe (Subcutaneous fat loss (3+): orbital 3+, cheek 3+, tricep 3+), Muscle  Mass Loss: Severe (Muscle mass loss (3+): temple 3+, clavicle 3+, shoulder 3+, scapula n/a, hand 3+, thigh 2+, calf 3+) Other Factors: Pneumonia, CHF (BMI 12.4),  BMI Assessment: less than 18.5 - underweight.        Diona Fanti, MS, RD, LD  09/19/2023, 14:12  [facility] Clinical  Dietitian  [employer] ext [phone]  Phone ext [phone]

## 2023-09-19 NOTE — Care Plan (Signed)
 Problem: Wound  Goal: Optimal Coping  Outcome: Ongoing (see interventions/notes)  Intervention: Support Patient and Family Response  Recent Flowsheet Documentation  Taken 09/19/2023 1000 by Constance Goltz, RN  Family/Support System Care: self-care encouraged  Supportive Measures: active listening utilized  Goal: Optimal Functional Ability  Outcome: Ongoing (see interventions/notes)  Goal: Absence of Infection Signs and Symptoms  Outcome: Ongoing (see interventions/notes)  Intervention: Prevent or Manage Infection  Recent Flowsheet Documentation  Taken 09/19/2023 1000 by Constance Goltz, RN  Fever Reduction/Comfort Measures:   lightweight bedding   lightweight clothing  Goal: Improved Oral Intake  Outcome: Ongoing (see interventions/notes)  Goal: Optimal Pain Control and Function  Outcome: Ongoing (see interventions/notes)  Goal: Skin Health and Integrity  Outcome: Ongoing (see interventions/notes)  Intervention: Optimize Skin Protection  Recent Flowsheet Documentation  Taken 09/19/2023 1100 by Constance Goltz, RN  Pressure Reduction Techniques: Frequent weight shifting encouraged  Pressure Reduction Devices:   Pressure redistributing mattress utilized   Sacral dressing utilized if not incontinent of bowel  Taken 09/19/2023 1000 by [place] C, RN  Pressure Reduction Techniques: Frequent weight shifting encouraged  Pressure Reduction Devices:   Pressure redistributing mattress utilized   Sacral dressing utilized if not incontinent of bowel  Head of Bed [facility]) Positioning: HOB elevated  Taken 09/19/2023 0809 by Constance Goltz, RN  Head of Bed [facility]) Positioning: HOB elevated  Goal: Optimal Wound Healing  Outcome: Ongoing (see interventions/notes)  Intervention: Promote Wound Healing  Recent Flowsheet Documentation  Taken 09/19/2023 1100 by Constance Goltz, RN  Pressure Reduction Techniques: Frequent weight shifting encouraged  Pressure Reduction Devices:   Pressure redistributing mattress utilized   Sacral dressing utilized if not incontinent of  bowel  Taken 09/19/2023 1000 by [place] C, RN  Pressure Reduction Techniques: Frequent weight shifting encouraged  Pressure Reduction Devices:   Pressure redistributing mattress utilized   Sacral dressing utilized if not incontinent of bowel

## 2023-09-19 NOTE — Care Management Notes (Signed)
 [facility]  Care Management Note    Patient Name: [name]  Date of Birth: 1934/10/15  Sex: female  Date/Time of Admission: 09/18/2023  8:51 PM  Room/Bed: 4130/A  Payor: MEDICARE / Plan: MEDICARE PART A AND B / Product Type: Medicare /    LOS: 0 days   PCP: Marylee Floras, MD    Admitting Diagnosis:  Pneumonia [J18.9]       09/19/23 0909   Medicare Intent to Discharge Documentation   Admit IMM given to: Patient   Admit IMM letter given date 09/19/23   Admit IMM letter time given 0858   IMM explained/reviewed with:  Patient;verbalized understanding         Case Manager: Ernesta Amble, RN  Phone: [phone]

## 2023-09-20 DIAGNOSIS — I440 Atrioventricular block, first degree: Secondary | ICD-10-CM

## 2023-09-20 DIAGNOSIS — E43 Unspecified severe protein-calorie malnutrition: Secondary | ICD-10-CM

## 2023-09-20 DIAGNOSIS — R9431 Abnormal electrocardiogram [ECG] [EKG]: Secondary | ICD-10-CM

## 2023-09-20 DIAGNOSIS — I447 Left bundle-branch block, unspecified: Secondary | ICD-10-CM

## 2023-09-20 LAB — BASIC METABOLIC PANEL
ANION GAP: 11 mmol/L (ref 4–13)
BUN/CREA RATIO: 36 — ABNORMAL HIGH (ref 6–22)
BUN: 41 mg/dL — ABNORMAL HIGH (ref 8–25)
CALCIUM: 8.8 mg/dL (ref 8.6–10.3)
CHLORIDE: 92 mmol/L — ABNORMAL LOW (ref 96–111)
CO2 TOTAL: 33 mmol/L — ABNORMAL HIGH (ref 23–31)
CREATININE: 1.15 mg/dL — ABNORMAL HIGH (ref 0.60–1.05)
ESTIMATED GFR - FEMALE: 46 mL/min/BSA — ABNORMAL LOW (ref >=60)
GLUCOSE: 91 mg/dL (ref 65–125)
POTASSIUM: 3.9 mmol/L (ref 3.5–5.1)
SODIUM: 136 mmol/L (ref 136–145)

## 2023-09-20 LAB — CBC WITH DIFF
BASOPHIL #: <0.1 10*3/uL (ref <=0.20)
BASOPHIL %: 0.1 %
EOSINOPHIL #: <0.1 10*3/uL (ref <=0.50)
EOSINOPHIL %: 0.1 %
HCT: 36.4 % (ref 34.8–46.0)
HGB: 11.9 g/dL (ref 11.5–16.0)
IMMATURE GRANULOCYTE #: 0.12 10*3/uL — ABNORMAL HIGH (ref <0.10)
IMMATURE GRANULOCYTE %: 0.7 % (ref 0.0–1.0)
LYMPHOCYTE #: 0.57 10*3/uL — ABNORMAL LOW (ref 1.00–4.80)
LYMPHOCYTE %: 3.3 %
MCH: 32 pg (ref 26.0–32.0)
MCHC: 32.7 g/dL (ref 31.0–35.5)
MCV: 97.8 fL (ref 78.0–100.0)
MONOCYTE #: 0.66 10*3/uL (ref 0.20–1.10)
MONOCYTE %: 3.8 %
MPV: 9.6 fL (ref 8.7–12.5)
NEUTROPHIL #: 16.13 10*3/uL — ABNORMAL HIGH (ref 1.50–7.70)
NEUTROPHIL %: 92 %
PLATELETS: 345 10*3/uL (ref 150–400)
RBC: 3.72 10*6/uL — ABNORMAL LOW (ref 3.85–5.22)
RDW-CV: 13.8 % (ref 11.5–15.5)
WBC: 17.5 10*3/uL — ABNORMAL HIGH (ref 3.7–11.0)

## 2023-09-20 LAB — ECG 12 LEAD - ED USE
Atrial Rate: 85 {beats}/min
Calculated P Axis: 27 degrees
Calculated R Axis: -42 degrees
Calculated T Axis: 121 degrees
PR Interval: 232 ms
QRS Duration: 130 ms
QT Interval: 464 ms
QTC Calculation: 552 ms
Ventricular rate: 85 {beats}/min

## 2023-09-20 LAB — MAGNESIUM: MAGNESIUM: 2.1 mg/dL (ref 1.8–2.6)

## 2023-09-20 LAB — PHOSPHORUS: PHOSPHORUS: 3.6 mg/dL (ref 2.3–4.0)

## 2023-09-20 NOTE — Care Plan (Signed)
 Problem: Wound  Goal: Optimal Coping  Outcome: Ongoing (see interventions/notes)  Goal: Optimal Functional Ability  Outcome: Ongoing (see interventions/notes)  Goal: Absence of Infection Signs and Symptoms  Outcome: Ongoing (see interventions/notes)  Intervention: Prevent or Manage Infection  Recent Flowsheet Documentation  Taken 09/20/2023 0900 by Nani Ravens, RN  Fever Reduction/Comfort Measures:   lightweight clothing   lightweight bedding  Goal: Improved Oral Intake  Outcome: Ongoing (see interventions/notes)  Goal: Optimal Pain Control and Function  Outcome: Ongoing (see interventions/notes)  Goal: Skin Health and Integrity  Outcome: Ongoing (see interventions/notes)  Intervention: Optimize Skin Protection  Recent Flowsheet Documentation  Taken 09/20/2023 0900 by Nani Ravens, RN  Pressure Reduction Techniques: Frequent weight shifting encouraged  Pressure Reduction Devices: Repositioning wedges/pillows utilized  Goal: Optimal Wound Healing  Outcome: Ongoing (see interventions/notes)  Intervention: Promote Wound Healing  Recent Flowsheet Documentation  Taken 09/20/2023 0900 by Nani Ravens, RN  Pressure Reduction Techniques: Frequent weight shifting encouraged  Pressure Reduction Devices: Repositioning wedges/pillows utilized     Problem: Health Knowledge, Opportunity to Enhance (Adult,Obstetrics,Pediatric)  Goal: Knowledgeable about Health Subject/Topic  Description: Patient will demonstrate the desired outcomes by discharge/transition of care.  Outcome: Ongoing (see interventions/notes)     Problem: Fall Injury Risk  Goal: Absence of Fall and Fall-Related Injury  Outcome: Ongoing (see interventions/notes)     Problem: Skin Injury Risk Increased  Goal: Skin Health and Integrity  Outcome: Ongoing (see interventions/notes)  Intervention: Optimize Skin Protection  Recent Flowsheet Documentation  Taken 09/20/2023 0900 by Nani Ravens, RN  Pressure Reduction Techniques: Frequent weight shifting encouraged  Pressure Reduction  Devices: Repositioning wedges/pillows utilized  Skin Protection: adhesive use limited     Problem: Adult Inpatient Plan of Care  Goal: Plan of Care Review  Outcome: Ongoing (see interventions/notes)  Goal: Patient-Specific Goal (Individualized)  Outcome: Ongoing (see interventions/notes)  Goal: Absence of Hospital-Acquired Illness or Injury  Outcome: Ongoing (see interventions/notes)  Intervention: Prevent Skin Injury  Recent Flowsheet Documentation  Taken 09/20/2023 0900 by Nani Ravens, RN  Skin Protection: adhesive use limited  Intervention: Prevent and Manage VTE (Venous Thromboembolism) Risk  Recent Flowsheet Documentation  Taken 09/20/2023 0900 by Nani Ravens, RN  VTE Prevention/Management: ambulation promoted  Goal: Optimal Comfort and Wellbeing  Outcome: Ongoing (see interventions/notes)  Goal: Rounds/Family Conference  Outcome: Ongoing (see interventions/notes)     Problem: Gas Exchange Impaired  Goal: Optimal Gas Exchange  Outcome: Ongoing (see interventions/notes)     Problem: Pneumonia  Goal: Fluid Balance  Outcome: Ongoing (see interventions/notes)  Goal: Absence of Infection Signs and Symptoms  Outcome: Ongoing (see interventions/notes)  Intervention: Prevent Infection Progression  Recent Flowsheet Documentation  Taken 09/20/2023 0900 by Nani Ravens, RN  Fever Reduction/Comfort Measures:   lightweight clothing   lightweight bedding  Goal: Effective Oxygenation and Ventilation  Outcome: Ongoing (see interventions/notes)     Problem: Fatigue  Goal: Improved Activity Tolerance  Outcome: Ongoing (see interventions/notes)     Problem: Heart Failure Comorbidity  Goal: Maintenance of Heart Failure Symptom Control  Outcome: Ongoing (see interventions/notes)

## 2023-09-20 NOTE — Nurses Notes (Signed)
 Patient alert and oriented x4. Vitals stable. IV maintained. Patient free from falls. Dressings remain clean dry and intact. No complaints or concerns at this time.  Tollie Eth, RN

## 2023-09-20 NOTE — Care Plan (Signed)
 Problem: Wound  Goal: Optimal Coping  09/20/2023 0545 by Johnney Ou, RN  Outcome: Ongoing (see interventions/notes)  09/20/2023 0545 by Johnney Ou, RN  Outcome: Ongoing (see interventions/notes)  Intervention: Support Patient and Family Response  Recent Flowsheet Documentation  Taken 09/19/2023 2049 by Johnney Ou, RN  Supportive Measures: active listening utilized  Goal: Optimal Functional Ability  09/20/2023 0545 by Johnney Ou, RN  Outcome: Ongoing (see interventions/notes)  09/20/2023 0545 by Johnney Ou, RN  Outcome: Ongoing (see interventions/notes)  Intervention: Optimize Functional Ability  Recent Flowsheet Documentation  Taken 09/19/2023 2049 by Johnney Ou, RN  Activity Management: ROM, active encouraged  Activity Assistance Provided: assistance, 1 person  Goal: Absence of Infection Signs and Symptoms  09/20/2023 0545 by Johnney Ou, RN  Outcome: Ongoing (see interventions/notes)  09/20/2023 0545 by Johnney Ou, RN  Outcome: Ongoing (see interventions/notes)  Intervention: Prevent or Manage Infection  Recent Flowsheet Documentation  Taken 09/19/2023 2049 by Johnney Ou, RN  Fever Reduction/Comfort Measures:   lightweight bedding   lightweight clothing  Goal: Improved Oral Intake  09/20/2023 0545 by Johnney Ou, RN  Outcome: Ongoing (see interventions/notes)  09/20/2023 0545 by Johnney Ou, RN  Outcome: Ongoing (see interventions/notes)  Goal: Optimal Pain Control and Function  09/20/2023 0545 by Johnney Ou, RN  Outcome: Ongoing (see interventions/notes)  09/20/2023 0545 by Johnney Ou, RN  Outcome: Ongoing (see interventions/notes)  Intervention: Prevent or Manage Pain  Recent Flowsheet Documentation  Taken 09/19/2023 2049 by Johnney Ou, RN  Sleep/Rest Enhancement: awakenings minimized  Goal: Skin Health and Integrity  09/20/2023 0545 by Johnney Ou, RN  Outcome: Ongoing (see interventions/notes)  09/20/2023 0545 by Johnney Ou, RN  Outcome: Ongoing (see interventions/notes)  Intervention: Optimize Skin Protection  Recent Flowsheet Documentation  Taken 09/19/2023  2049 by Johnney Ou, RN  Pressure Reduction Techniques: Frequent weight shifting encouraged  Pressure Reduction Devices: Repositioning wedges/pillows utilized  Activity Management: ROM, active encouraged  Head of Bed (HOB) Positioning: HOB elevated  Goal: Optimal Wound Healing  09/20/2023 0545 by Johnney Ou, RN  Outcome: Ongoing (see interventions/notes)  09/20/2023 0545 by Johnney Ou, RN  Outcome: Ongoing (see interventions/notes)  Intervention: Promote Wound Healing  Recent Flowsheet Documentation  Taken 09/19/2023 2049 by Johnney Ou, RN  Pressure Reduction Techniques: Frequent weight shifting encouraged  Pressure Reduction Devices: Repositioning wedges/pillows utilized  Sleep/Rest Enhancement: awakenings minimized  Activity Management: ROM, active encouraged     Problem: Health Knowledge, Opportunity to Enhance (Adult,Obstetrics,Pediatric)  Goal: Knowledgeable about Health Subject/Topic  Description: Patient will demonstrate the desired outcomes by discharge/transition of care.  09/20/2023 0545 by Johnney Ou, RN  Outcome: Ongoing (see interventions/notes)  09/20/2023 0545 by Johnney Ou, RN  Outcome: Ongoing (see interventions/notes)  Intervention: Enhance Health Knowledge  Recent Flowsheet Documentation  Taken 09/19/2023 2049 by Johnney Ou, RN  Supportive Measures: active listening utilized     Problem: Fall Injury Risk  Goal: Absence of Fall and Fall-Related Injury  09/20/2023 0545 by Johnney Ou, RN  Outcome: Ongoing (see interventions/notes)  09/20/2023 0545 by Johnney Ou, RN  Outcome: Ongoing (see interventions/notes)  Intervention: Identify and Manage Contributors  Recent Flowsheet Documentation  Taken 09/19/2023 2049 by Johnney Ou, RN  Self-Care Promotion:   independence encouraged   BADL personal objects within reach   BADL personal routines maintained  Intervention: Promote Injury-Free Environment  Recent Flowsheet Documentation  Taken 09/20/2023 0400 by Johnney Ou, RN  Safety Promotion/Fall Prevention: safety round/check  completed  Taken 09/20/2023 0000 by Johnney Ou, RN  Safety Promotion/Fall Prevention: safety  round/check completed  Taken 09/19/2023 2200 by Johnney Ou, RN  Safety Promotion/Fall Prevention: safety round/check completed  Taken 09/19/2023 2049 by Johnney Ou, RN  Safety Promotion/Fall Prevention: safety round/check completed     Problem: Skin Injury Risk Increased  Goal: Skin Health and Integrity  09/20/2023 0545 by Johnney Ou, RN  Outcome: Ongoing (see interventions/notes)  09/20/2023 0545 by Johnney Ou, RN  Outcome: Ongoing (see interventions/notes)  Intervention: Optimize Skin Protection  Recent Flowsheet Documentation  Taken 09/19/2023 2049 by Johnney Ou, RN  Pressure Reduction Techniques: Frequent weight shifting encouraged  Pressure Reduction Devices: Repositioning wedges/pillows utilized  Skin Protection: adhesive use limited  Activity Management: ROM, active encouraged  Head of Bed [facility]) Positioning: HOB elevated     Problem: Adult Inpatient Plan of Care  Goal: Plan of Care Review  09/20/2023 0545 by Johnney Ou, RN  Outcome: Ongoing (see interventions/notes)  09/20/2023 0545 by Johnney Ou, RN  Outcome: Ongoing (see interventions/notes)  Goal: Patient-Specific Goal (Individualized)  09/20/2023 0545 by Johnney Ou, RN  Outcome: Ongoing (see interventions/notes)  09/20/2023 0545 by Johnney Ou, RN  Outcome: Ongoing (see interventions/notes)  Goal: Absence of Hospital-Acquired Illness or Injury  09/20/2023 0545 by Johnney Ou, RN  Outcome: Ongoing (see interventions/notes)  09/20/2023 0545 by Johnney Ou, RN  Outcome: Ongoing (see interventions/notes)  Intervention: Identify and Manage Fall Risk  Recent Flowsheet Documentation  Taken 09/20/2023 0400 by Johnney Ou, RN  Safety Promotion/Fall Prevention: safety round/check completed  Taken 09/20/2023 0000 by Johnney Ou, RN  Safety Promotion/Fall Prevention: safety round/check completed  Taken 09/19/2023 2200 by Johnney Ou, RN  Safety Promotion/Fall Prevention: safety round/check completed  Taken 09/19/2023 2049 by  Johnney Ou, RN  Safety Promotion/Fall Prevention: safety round/check completed  Intervention: Prevent Skin Injury  Recent Flowsheet Documentation  Taken 09/19/2023 2049 by Johnney Ou, RN  Body Position: supine, head elevated  Skin Protection: adhesive use limited  Intervention: Prevent and Manage VTE (Venous Thromboembolism) Risk  Recent Flowsheet Documentation  Taken 09/19/2023 2049 by Johnney Ou, RN  VTE Prevention/Management:   ambulation promoted   dorsiflexion/plantar flexion performed  Goal: Optimal Comfort and Wellbeing  09/20/2023 0545 by Johnney Ou, RN  Outcome: Ongoing (see interventions/notes)  09/20/2023 0545 by Johnney Ou, RN  Outcome: Ongoing (see interventions/notes)  Intervention: Provide Person-Centered Care  Recent Flowsheet Documentation  Taken 09/19/2023 2049 by Johnney Ou, RN  Trust Relationship/Rapport:   care explained   choices provided   emotional support provided   empathic listening provided   questions answered   questions encouraged   reassurance provided   thoughts/feelings acknowledged  Goal: Rounds/Family Conference  09/20/2023 0545 by Johnney Ou, RN  Outcome: Ongoing (see interventions/notes)  09/20/2023 0545 by Johnney Ou, RN  Outcome: Ongoing (see interventions/notes)     Problem: Gas Exchange Impaired  Goal: Optimal Gas Exchange  09/20/2023 0545 by Johnney Ou, RN  Outcome: Ongoing (see interventions/notes)  09/20/2023 0545 by Johnney Ou, RN  Outcome: Ongoing (see interventions/notes)  Intervention: Optimize Oxygenation and Ventilation  Recent Flowsheet Documentation  Taken 09/19/2023 2049 by Johnney Ou, RN  Head of Bed [facility]) Positioning: HOB elevated     Problem: Pneumonia  Goal: Fluid Balance  09/20/2023 0545 by Johnney Ou, RN  Outcome: Ongoing (see interventions/notes)  09/20/2023 0545 by Johnney Ou, RN  Outcome: Ongoing (see interventions/notes)  Goal: Absence of Infection Signs and Symptoms  09/20/2023 0545 by Johnney Ou, RN  Outcome: Ongoing (see interventions/notes)  09/20/2023 0545 by Johnney Ou, RN  Outcome:  Ongoing (see interventions/notes)  Intervention: Prevent Infection Progression  Recent Flowsheet Documentation  Taken 09/19/2023 2049 by Johnney Ou, RN  Fever Reduction/Comfort Measures:   lightweight bedding   lightweight clothing  Goal: Effective Oxygenation and Ventilation  09/20/2023 0545 by Johnney Ou, RN  Outcome: Ongoing (see interventions/notes)  09/20/2023 0545 by Johnney Ou, RN  Outcome: Ongoing (see interventions/notes)  Intervention: Optimize Oxygenation and Ventilation  Recent Flowsheet Documentation  Taken 09/19/2023 2049 by Johnney Ou, RN  Head of Bed [facility]) Positioning: HOB elevated     Problem: Fatigue  Goal: Improved Activity Tolerance  09/20/2023 0545 by Johnney Ou, RN  Outcome: Ongoing (see interventions/notes)  09/20/2023 0545 by Johnney Ou, RN  Outcome: Ongoing (see interventions/notes)  Intervention: Promote Improved Energy  Recent Flowsheet Documentation  Taken 09/19/2023 2049 by Johnney Ou, RN  Sleep/Rest Enhancement: awakenings minimized  Activity Management: ROM, active encouraged     Problem: Heart Failure Comorbidity  Goal: Maintenance of Heart Failure Symptom Control  09/20/2023 0545 by Johnney Ou, RN  Outcome: Ongoing (see interventions/notes)  09/20/2023 0545 by Johnney Ou, RN  Outcome: Ongoing (see interventions/notes)

## 2023-09-20 NOTE — Care Plan (Signed)
 Problem: Wound  Goal: Optimal Coping  Outcome: Ongoing (see interventions/notes)  Goal: Optimal Functional Ability  Outcome: Ongoing (see interventions/notes)  Goal: Absence of Infection Signs and Symptoms  Outcome: Ongoing (see interventions/notes)  Goal: Improved Oral Intake  Outcome: Ongoing (see interventions/notes)  Goal: Optimal Pain Control and Function  Outcome: Ongoing (see interventions/notes)  Goal: Skin Health and Integrity  Outcome: Ongoing (see interventions/notes)  Goal: Optimal Wound Healing  Outcome: Ongoing (see interventions/notes)

## 2023-09-20 NOTE — Nurses Notes (Signed)
 Patient alert and oriented x4 for the duration of this shift. On 2L NC.  IV remains clean, dry, and intact. Dressings remain clean, dry, and intact. Fall, skin precautions maintained. Call bell within reach. Plan of care ongoing. Currently this patient meets requirements for a low to mid level of nursing care.  The patient will continue to be monitored and re-evaluated for any changes.       BP (!) 81/51   Pulse 72   Temp 36.6 C (97.9 F)   Resp 18   Ht 1.575 m (5\' 2" )   Wt (!) 30.8 kg (67 lb 14.4 oz)   LMP  (LMP Unknown)   SpO2 90%   BMI 12.42 kg/m        Emeline Darling, RN

## 2023-09-20 NOTE — Progress Notes (Signed)
 Internal Medicine Progress Note    Names:  Denzil Hughes  Date of service: 09/20/2023  Date of Admission:  09/18/2023  Hospital Day:  LOS: 1 day     Assessment/ Plan:   Active Hospital Problems    Diagnosis    Primary Problem: Pneumonia       1. Community-acquired pneumonia.  Today is day 3.  Have Rocephin and doxycycline.  Continue.      2. Urinary tract infection.  On Rocephin.  Follow up on cultures and mics     3. 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  Blood pressure borderline today.  Hold diuretic and afterload reducer.      4. Chronic constipation.  Monitor.      5. Severe protein calorie malnutrition.  Continue to encourage nutritional intake.      PT/OT: Yes    Disposition Planning:   Home with home health    Subjective:  No chest pain or palpitations.  Still some shortness a breath.  Still describes cough and congestion.  No nausea vomiting.  Passing flatus.  No bowel movement as of yet today.  Voiding.    acetaminophen (TYLENOL) tablet, 650 mg, Oral, Q6H PRN  aspirin (ECOTRIN) enteric coated tablet 81 mg, 81 mg, Oral, Daily  atorvastatin (LIPITOR) tablet, 40 mg, Oral, QPM  carvedilol (COREG) tablet, 3.125 mg, Oral, Daily  cefTRIAXone (ROCEPHIN) injection 2 g, 2 g, Intravenous, Q24H  D5W 250 mL flush bag, , Intravenous, Q15 Min PRN  doxycycline hyclate (VIBRAMYCIN) capsule, 100 mg, Oral, 2x/day  enoxaparin PF (LOVENOX) 30 mg/0.3 mL SubQ injection, 30 mg, Subcutaneous, Q24H  [Held by provider] furosemide (LASIX) tablet, 40 mg, Oral, 2x/day  guaiFENesin (MUCINEX) extended release tablet - for cough (expectorant), 600 mg, Oral, 2x/day  ipratropium-albuterol 0.5 mg-3 mg(2.5 mg base)/3 mL Solution for Nebulization, 3 mL, Nebulization, 4x/day  lactulose (ENULOSE) 10g per 15mL oral liquid, 30 mL, Oral, 2x/day  levothyroxine (SYNTHROID) tablet, 75 mcg, Oral, QAM  [Held by provider] lisinopril (PRINIVIL) tablet, 2.5 mg, Oral, Daily  metoclopramide (REGLAN) tablet, 5 mg, Oral, 3x/day  AC  NS 250 mL flush bag, , Intravenous, Q15 Min PRN  NS flush syringe, 3 mL, Intracatheter, Q8HRS  NS flush syringe, 3 mL, Intracatheter, Q1H PRN  ondansetron (ZOFRAN) 2 mg/mL injection, 4 mg, Intravenous, Q6H PRN  [Held by provider] potassium chloride (KCLOR-CON) extended release tablet, 10 mEq, Oral, 2x/day-Food  sennosides-docusate sodium (SENOKOT-S) 8.6-50mg  per tablet, 1 Tablet, Oral, 2x/day  simethicone (MYLICON) chewable tablet, 160 mg, Oral, 4x/day        Physical Exam:    Vital Signs:  BP (!) 88/41   Pulse 70   Temp 36.3 C (97.3 F)   Resp 15   Ht 1.575 m (5\' 2" )   Wt (!) 30.8 kg (67 lb 14.4 oz)   LMP  (LMP Unknown)   SpO2 93%   BMI 12.42 kg/m         I/O:  I/O last 24 hours:    Intake/Output Summary (Last 24 hours) at 09/20/2023 1038  Last data filed at 09/20/2023 1000  Gross per 24 hour   Intake 420 ml   Output --   Net 420 ml     I/O current shift:  03/09 0700 - 03/09 1859  In: 420 [P.O.:420]  Out: -   Blood Sugars:     General:  Somnolent but arouses easily  Cardio: Regular rate and rhythm. Normal S1 and S2. No murmurs, gallops, or rubs.  Resp:  Crackles on the right.  No bronchospasm noted today.  Abd: Bowel sounds present, Soft, non-tender  Extremities: No edema or swelling noted      Labs:     Results for orders placed or performed during the hospital encounter of 09/18/23 (from the past 24 hours)   CBC/DIFF    Narrative    The following orders were created for panel order CBC/DIFF.  Procedure                               Abnormality         Status                     ---------                               -----------         ------                     CBC WITH YNWG[[id number]]                Abnormal            Final result                 Please view results for these tests on the individual orders.   BASIC METABOLIC PANEL, NON-FASTING   Result Value Ref Range    SODIUM 136 136 - 145 mmol/L    POTASSIUM 3.9 3.5 - 5.1 mmol/L    CHLORIDE 92 (L) 96 - 111 mmol/L    CO2 TOTAL 33 (H) 23 - 31  mmol/L    ANION GAP 11 4 - 13 mmol/L    CALCIUM 8.8 8.6 - 10.3 mg/dL    GLUCOSE 91 65 - 578 mg/dL    BUN 41 (H) 8 - 25 mg/dL    CREATININE 4.69 (H) 0.60 - 1.05 mg/dL    BUN/CREA RATIO 36 (H) 6 - 22    ESTIMATED GFR - FEMALE 46 (L) >=60 mL/min/BSA   MAGNESIUM   Result Value Ref Range    MAGNESIUM 2.1 1.8 - 2.6 mg/dL   PHOSPHORUS   Result Value Ref Range    PHOSPHORUS 3.6 2.3 - 4.0 mg/dL   CBC WITH DIFF   Result Value Ref Range    WBC 17.5 (H) 3.7 - 11.0 x10^3/uL    RBC 3.72 (L) 3.85 - 5.22 x10^6/uL    HGB 11.9 11.5 - 16.0 g/dL    HCT 62.9 52.8 - 41.3 %    MCV 97.8 78.0 - 100.0 fL    MCH 32.0 26.0 - 32.0 pg    MCHC 32.7 31.0 - 35.5 g/dL    RDW-CV 24.4 01.0 - 27.2 %    PLATELETS 345 150 - 400 x10^3/uL    MPV 9.6 8.7 - 12.5 fL    NEUTROPHIL % 92.0 %    LYMPHOCYTE % 3.3 %    MONOCYTE % 3.8 %    EOSINOPHIL % 0.1 %    BASOPHIL % 0.1 %    NEUTROPHIL # 16.13 (H) 1.50 - 7.70 x10^3/uL    LYMPHOCYTE # 0.57 (L) 1.00 - 4.80 x10^3/uL    MONOCYTE # 0.66 0.20 - 1.10 x10^3/uL    EOSINOPHIL # <0.10 <=0.50 x10^3/uL    BASOPHIL # <0.10 <=0.20 x10^3/uL    IMMATURE GRANULOCYTE %  0.7 0.0 - 1.0 %    IMMATURE GRANULOCYTE # 0.12 (H) <0.10 x10^3/uL       Micro:   Hospital Encounter on 09/18/23 (from the past 96 hours)   URINE CULTURE,ROUTINE    Collection Time: 09/18/23 10:24 PM    Specimen: Urine, Site not specified   Culture Result Status    URINE CULTURE 100,000 Gram Negative Rods (A) Preliminary       Radiology:       Marylee Floras, MD    09/20/2023   10:38  [facility]  [facility]

## 2023-09-21 ENCOUNTER — Encounter (HOSPITAL_COMMUNITY): Payer: Self-pay | Admitting: INTERNAL MEDICINE

## 2023-09-21 DIAGNOSIS — I110 Hypertensive heart disease with heart failure: Secondary | ICD-10-CM

## 2023-09-21 DIAGNOSIS — E039 Hypothyroidism, unspecified: Secondary | ICD-10-CM

## 2023-09-21 DIAGNOSIS — K219 Gastro-esophageal reflux disease without esophagitis: Secondary | ICD-10-CM

## 2023-09-21 DIAGNOSIS — I5022 Chronic systolic (congestive) heart failure: Secondary | ICD-10-CM

## 2023-09-21 DIAGNOSIS — J189 Pneumonia, unspecified organism: Secondary | ICD-10-CM

## 2023-09-21 DIAGNOSIS — E782 Mixed hyperlipidemia: Secondary | ICD-10-CM

## 2023-09-21 LAB — CBC
HCT: 31.5 % — ABNORMAL LOW (ref 34.8–46.0)
HGB: 10.5 g/dL — ABNORMAL LOW (ref 11.5–16.0)
MCH: 32.4 pg — ABNORMAL HIGH (ref 26.0–32.0)
MCHC: 33.3 g/dL (ref 31.0–35.5)
MCV: 97.2 fL (ref 78.0–100.0)
MPV: 9 fL (ref 8.7–12.5)
PLATELETS: 325 10*3/uL (ref 150–400)
RBC: 3.24 10*6/uL — ABNORMAL LOW (ref 3.85–5.22)
RDW-CV: 13.6 % (ref 11.5–15.5)
WBC: 11.1 10*3/uL — ABNORMAL HIGH (ref 3.7–11.0)

## 2023-09-21 LAB — BASIC METABOLIC PANEL
ANION GAP: 10 mmol/L (ref 4–13)
BUN/CREA RATIO: 39 — ABNORMAL HIGH (ref 6–22)
BUN: 37 mg/dL — ABNORMAL HIGH (ref 8–25)
CALCIUM: 8.8 mg/dL (ref 8.6–10.3)
CHLORIDE: 95 mmol/L — ABNORMAL LOW (ref 96–111)
CO2 TOTAL: 34 mmol/L — ABNORMAL HIGH (ref 23–31)
CREATININE: 0.94 mg/dL (ref 0.60–1.05)
ESTIMATED GFR - FEMALE: 59 mL/min/BSA — ABNORMAL LOW (ref >=60)
GLUCOSE: 90 mg/dL (ref 65–125)
POTASSIUM: 3.9 mmol/L (ref 3.5–5.1)
SODIUM: 139 mmol/L (ref 136–145)

## 2023-09-21 LAB — URINE CULTURE,ROUTINE: URINE CULTURE: 100000 — AB

## 2023-09-21 LAB — PHOSPHORUS: PHOSPHORUS: 2.5 mg/dL (ref 2.3–4.0)

## 2023-09-21 LAB — MAGNESIUM: MAGNESIUM: 2.2 mg/dL (ref 1.8–2.6)

## 2023-09-21 MED ORDER — LACTATED RINGERS IV BOLUS
250.0000 mL | INJECTION | Freq: Once | Status: AC
Start: 2023-09-21 — End: 2023-09-21
  Administered 2023-09-21: 250 mL via INTRAVENOUS
  Administered 2023-09-21: 0 mL via INTRAVENOUS

## 2023-09-21 MED ORDER — BISACODYL 5 MG TABLET,DELAYED RELEASE
10.0000 mg | DELAYED_RELEASE_TABLET | Freq: Once | ORAL | Status: AC
Start: 2023-09-21 — End: 2023-09-21
  Administered 2023-09-21: 10 mg via ORAL
  Filled 2023-09-21: qty 2

## 2023-09-21 MED ORDER — PANTOPRAZOLE 40 MG TABLET,DELAYED RELEASE
40.0000 mg | DELAYED_RELEASE_TABLET | Freq: Every morning | ORAL | Status: DC
Start: 2023-09-21 — End: 2023-09-24
  Administered 2023-09-21 – 2023-09-24 (×4): 40 mg via ORAL
  Filled 2023-09-21 (×4): qty 1

## 2023-09-21 MED ORDER — NYSTATIN 100,000 UNIT/ML ORAL SUSPENSION
5.0000 mL | Freq: Four times a day (QID) | ORAL | Status: DC
Start: 2023-09-21 — End: 2023-09-24
  Administered 2023-09-21 – 2023-09-24 (×12): 5 mL via ORAL
  Filled 2023-09-21 (×12): qty 5

## 2023-09-21 NOTE — Care Plan (Signed)
 Patient rested well this shift. Patient remains A/O. Patient no c/o pain or distress. Patient remains incontinent of urine. Patient bed alarm set for safety. I have reviewed this patient's orders and plan of care.  Currently this patient meets requirements for a low to mid level of nursing care.     Lorre Munroe, RN

## 2023-09-21 NOTE — Nurses Notes (Signed)
 Currently this patient meets requirements for a low to mid level of nursing care.  The patient will continue to be monitored and re-evaluated for any changes.

## 2023-09-21 NOTE — Care Plan (Signed)
 [facility]  Rehabilitation Services  Physical Therapy Initial Evaluation    Patient Name: [name]  Date of Birth: 1935-06-03  Height: Height: 157.5 cm (5\' 2" )  Weight: Weight: (!) 30.8 kg (67 lb 14.4 oz)  Room/Bed: 4130/A  Payor: MEDICARE / Plan: MEDICARE PART A AND B / Product Type: Medicare /     Assessment:      Pt demonstrated deficits with endurance, balance, strength, ROM And mobility. Pt required minA for bed mobility and transfers. Pt refused ambulation and sitting in chair. Pt reports living with daugther. She notes being ambulatory at home. Therefore, recommend SNF to improve mobility.    Discharge Needs:    Equipment Recommendation: front wheeled walker        Discharge Disposition: skilled nursing facility    JUSTIFICATION OF DISCHARGE RECOMMENDATION   Based on current diagnosis, functional performance prior to admission, and current functional performance, this patient requires continued PT services in skilled nursing facility in order to achieve significant functional improvements in these deficit areas: aerobic capacity/endurance, gait, locomotion, and balance.        Plan:   Current Intervention: balance training, gait training, home exercise program, bed mobility training, neuromuscular re-education, patient/family education, postural re-education, strengthening, ROM (range of motion), stair training, stretching, transfer training  To provide physical therapy services minimum of 5x/week  for duration of until goals are met.    The risks/benefits of therapy have been discussed with the patient/caregiver and he/she is in agreement with the established plan of care.       Subjective & Objective        09/21/23 1303   Therapist Pager   PT Assigned/ Pager # [phone]   Rehab Session   Document Type evaluation   PT Visit Date 09/21/23   Total PT Minutes: 14   Patient Effort fair   Symptoms Noted During/After Treatment fatigue   General Information   Patient Profile Reviewed yes   Onset of  Illness/Injury or Date of Surgery 09/18/23   Patient/Family/Caregiver Comments/Observations Pt is upset due to required to sit in chair yesterday for longer time. PT reports "not getting in chair today"   Pertinent History of Current Functional Problem Pt admitted due ot pnu. PMHx: ejection fx 30-40%   Medical Lines PIV Line   Respiratory Status nasal cannula   Existing Precautions/Restrictions fall precautions   Mutuality/Individual Preferences   Individualized Care Needs FWW/assist x1   Plan of Care Reviewed With patient   Living Environment   Lives With child(ren), adult   Living Arrangements house   Home Assessment: Stairs in Home   Home Accessibility stairs to enter home   Living Environment Comment Pt notes living with daughter with needs met on 1 level   Home Main Entrance   Number of Stairs, Main Entrance one   Functional Level Prior   Ambulation 1 - assistive equipment  (FWW)   Transferring 1 - assistive equipment   Toileting 0 - independent   Bathing 2 - assistive person   Dressing 2 - assistive person   Eating 0 - independent   Swallowing 0-->swallows foods/liquids without difficulty   Prior Functional Level Comment She report using rollator walker. Pt has assistance with ADLs. Pt is on room air   Self-Care   Usual Activity Tolerance moderate   Current Activity Tolerance fair   Equipment Currently Used at Home yes   Equipment Currently Used at Home walker, rolling   Pre Treatment Status   Pre  Treatment Patient Status Patient supine in bed   Support Present Pre Treatment  None   Cognition   Behavior/Mood Observations alert;confused;cooperative   Orientation Status oriented x 4   Attention mild impairment   Follows Commands follows one step commands   Comment HOH. REquired increased time; required writing on note pad   Vital Signs   O2 Delivery Post Treatment supplemental O2   Pain Assessment   Pretreatment Pain Rating 0/10 - no pain   Pre/Posttreatment Pain Comment Pt denies   General Extremity Assessment    Comment Generalized weakness and deconditioning. Gross strength 3+/5   Bed Mobility   Supine-Sit Independence minimum assist (75% patient effort)   Sit to Supine, Independence minimum assist (75% patient effort)   Bed Mobility, Assistive Device Head of Bed Elevated   Comment Pt able to move B LE, however requires assistance with trunk and scooting   Safety Issues decreased use of arms for pushing/pulling;impaired trunk control for bed mobility;decreased use of legs for bridging/pushing   Impairments balance impaired;endurance;strength decreased   Transfer Assessment/Treatment   Sit-Stand Independence minimum assist (75% patient effort)   Stand-Sit Independence minimum assist (75% patient effort)   Sit-Stand-Sit, Assist Device handheld assist   Transfer Impairments balance impaired;endurance;strength decreased   Transfer Comment pt had to stand 3x due ot bladder accdient. Pt was cleaned/dried prior to going back t obed. Pt able to take one side step alongside bed.   Gait Assessment/Treatment   Comment Pt refused   Balance   Sitting Balance: Static fair + balance   Sitting, Dynamic (Balance) fair + balance   Sit-to-Stand Balance fair balance   Standing Balance: Static fair balance   Standing Balance: Dynamic fair balance   Post Treatment Status   Post Treatment Patient Status Patient sitting in bedside chair or w/c;Call light within reach;Telephone within reach;Patient safety alarm activated   Support Present Post Treatment  None   Plan of Care Review   Plan Of Care Reviewed With patient   Physical Therapy Clinical Impression   Assessment Pt demonstrated deficits with endurance, balance, strength, ROM And mobility. Pt required minA for bed mobility and transfers. Pt refused ambulation and sitting in chair. Pt reports living with daugther. She notes being ambulatory at home. Therefore, recommend SNF to improve mobility.   Criteria for Skilled Therapeutic yes;meets criteria   Pathology/Pathophysiology Noted  musculoskeletal;cardiovascular;pulmonary;neuromuscular   Impairments Found (describe specific impairments) aerobic capacity/endurance;gait, locomotion, and balance   Functional Limitations in Following  self-care   Rehab Potential good   Therapy Frequency minimum of 5x/week   Predicted Duration of Therapy Intervention (days/wks) until goals are met   Anticipated Equipment Needs at Discharge (PT) front wheeled walker   Anticipated Discharge Disposition skilled nursing facility   Evaluation Complexity Justification   Patient History: Co-morbidity/factors that impact Plan of Care 3 or more that impact Plan of Care   Examination Components 3 or more Exam elements addressed   Presentation Stable: Uncomplicated, straight-forward, problem focused   Clinical Decision Making Moderate complexity   Evaluation Complexity Moderate complexity   Care Plan Goals   PT Rehab Goals Bed Mobility Goal;Gait Training Goal;Stairs Training Goal;Transfer Training Goal   Bed Mobility Goal   Bed Mobility Goal, Date Established 09/21/23   Bed Mobility Goal, Time to Achieve 30 days   Bed Mobility Goal, Activity Type all bed mobility activities   Bed Mobility Goal, Independence Level modified independence   Bed Mobility Goal, Assistive Device least restrictive assistive device   Gait  Training  Goal, Distance to Achieve   Gait Training  Goal, Date Established 09/21/23   Gait Training  Goal, Time to Achieve 30 days   Gait Training  Goal, Independence Level modified independence   Gait Training  Goal, Assist Device least restricted assistive device   Gait Training  Goal, Distance to Achieve 300   Stairs Training Goal   Stairs Training Goal, Date Established 09/21/23   Stairs Training Goal, Time to Achieve 30 days   Stairs Training Goal, Independence Level modified independence   Stairs Training Goal, Assist Device least restrictive assistive device   Stairs Training Goal, Number of Stairs to Achieve 4   Transfer Training Goal   Transfer Training  Goal, Date Established 09/21/23   Transfer Training Goal, Time to Achieve 30 days   Transfer Training Goal, Activity Type all transfers   Transfer Training Goal, Independence Level modified independence   Planned Therapy Interventions, PT Eval   Planned Therapy Interventions (PT) balance training;gait training;home exercise program;bed mobility training;neuromuscular re-education;patient/family education;postural re-education;strengthening;ROM (range of motion);stair training;stretching;transfer training       Therapist:   Mora Bellman, PT 09/21/2023 13:10  Pager #: [phone]

## 2023-09-21 NOTE — Progress Notes (Signed)
 Internal Medicine Progress Note    Names:  [name]  Date of service: 09/21/2023  Date of Admission:  09/18/2023  Hospital Day:  LOS: 2 days     Assessment/ Plan:   Active Hospital Problems    Diagnosis    Primary Problem: Pneumonia    Community acquired pneumonia of right lower lobe of lung       1. Community-acquired pneumonia.  Today is day 4.  Have Rocephin and doxycycline.  Continue.      2. Urinary tract infection.  On Rocephin.  Follow up on cultures and mics     3. 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  Blood pressure borderline today.  Blood pressure remains borderline low.  Holding medications.    4. Chronic constipation.  Add Dulcolax x1 today.  10 mg    5. Severe protein calorie malnutrition.  Continue to encourage nutritional intake.  Again counseled patient on nutrition    PT/OT: Yes    Disposition Planning:   Home with home health    Subjective:  No chest pain or palpitations.  Still some shortness a breath.  Still describes cough and congestion.  No nausea vomiting.  Passing flatus.  No bowel movement as of yet today.  Voiding.    acetaminophen (TYLENOL) tablet, 650 mg, Oral, Q6H PRN  aspirin (ECOTRIN) enteric coated tablet 81 mg, 81 mg, Oral, Daily  atorvastatin (LIPITOR) tablet, 40 mg, Oral, QPM  bisacodyl (DULCOLAX) enteric coated tablet, 10 mg, Oral, Once  carvedilol (COREG) tablet, 3.125 mg, Oral, Daily  cefTRIAXone (ROCEPHIN) injection 2 g, 2 g, Intravenous, Q24H  D5W 250 mL flush bag, , Intravenous, Q15 Min PRN  doxycycline hyclate (VIBRAMYCIN) capsule, 100 mg, Oral, 2x/day  enoxaparin PF (LOVENOX) 30 mg/0.3 mL SubQ injection, 30 mg, Subcutaneous, Q24H  [Held by provider] furosemide (LASIX) tablet, 40 mg, Oral, 2x/day  guaiFENesin (MUCINEX) extended release tablet - for cough (expectorant), 600 mg, Oral, 2x/day  ipratropium-albuterol 0.5 mg-3 mg(2.5 mg base)/3 mL Solution for Nebulization, 3 mL, Nebulization, 4x/day  lactulose (ENULOSE) 10g per 15mL oral  liquid, 30 mL, Oral, 2x/day  levothyroxine (SYNTHROID) tablet, 75 mcg, Oral, QAM  [Held by provider] lisinopril (PRINIVIL) tablet, 2.5 mg, Oral, Daily  metoclopramide (REGLAN) tablet, 5 mg, Oral, 3x/day AC  NS 250 mL flush bag, , Intravenous, Q15 Min PRN  NS flush syringe, 3 mL, Intracatheter, Q8HRS  NS flush syringe, 3 mL, Intracatheter, Q1H PRN  ondansetron (ZOFRAN) 2 mg/mL injection, 4 mg, Intravenous, Q6H PRN  pantoprazole (PROTONIX) delayed release tablet, 40 mg, Oral, Daily before Breakfast  [Held by provider] potassium chloride (KCLOR-CON) extended release tablet, 10 mEq, Oral, 2x/day-Food  sennosides-docusate sodium (SENOKOT-S) 8.6-50mg  per tablet, 1 Tablet, Oral, 2x/day  simethicone (MYLICON) chewable tablet, 160 mg, Oral, 4x/day        Physical Exam:    Vital Signs:  BP (!) 104/49   Pulse 82   Temp 36.6 C (97.9 F)   Resp 18   Ht 1.575 m (5\' 2" )   Wt (!) 30.8 kg (67 lb 14.4 oz)   LMP  (LMP Unknown)   SpO2 98%   BMI 12.42 kg/m         I/O:  I/O last 24 hours:    Intake/Output Summary (Last 24 hours) at 09/21/2023 1151  Last data filed at 09/21/2023 1026  Gross per 24 hour   Intake 850 ml   Output --   Net 850 ml     I/O current shift:  03/10 0700 - 03/10 1859  In: 490 [P.O.:240; I.V.:250]  Out: -   Blood Sugars:     General:  Resting in bed.  Alert.  Cooperative.  Cardio: Regular rate and rhythm. Normal S1 and S2. No murmurs, gallops, or rubs.   Resp:  Crackles on the right.  No bronchospasm noted today.  Abd: Bowel sounds present, Soft, non-tender  Extremities: No edema or swelling noted      Labs:     Results for orders placed or performed during the hospital encounter of 09/18/23 (from the past 24 hours)   CBC   Result Value Ref Range    WBC 11.1 (H) 3.7 - 11.0 x10^3/uL    RBC 3.24 (L) 3.85 - 5.22 x10^6/uL    HGB 10.5 (L) 11.5 - 16.0 g/dL    HCT 09.8 (L) 11.9 - 46.0 %    MCV 97.2 78.0 - 100.0 fL    MCH 32.4 (H) 26.0 - 32.0 pg    MCHC 33.3 31.0 - 35.5 g/dL    RDW-CV 14.7 82.9 - 56.2 %    PLATELETS  325 150 - 400 x10^3/uL    MPV 9.0 8.7 - 12.5 fL   BASIC METABOLIC PANEL   Result Value Ref Range    SODIUM 139 136 - 145 mmol/L    POTASSIUM 3.9 3.5 - 5.1 mmol/L    CHLORIDE 95 (L) 96 - 111 mmol/L    CO2 TOTAL 34 (H) 23 - 31 mmol/L    ANION GAP 10 4 - 13 mmol/L    CALCIUM 8.8 8.6 - 10.3 mg/dL    GLUCOSE 90 65 - 130 mg/dL    BUN 37 (H) 8 - 25 mg/dL    CREATININE 8.65 7.84 - 1.05 mg/dL    BUN/CREA RATIO 39 (H) 6 - 22    ESTIMATED GFR - FEMALE 59 (L) >=60 mL/min/BSA   MAGNESIUM   Result Value Ref Range    MAGNESIUM 2.2 1.8 - 2.6 mg/dL   PHOSPHORUS   Result Value Ref Range    PHOSPHORUS 2.5 2.3 - 4.0 mg/dL       Micro:   Hospital Encounter on 09/18/23 (from the past 96 hours)   URINE CULTURE,ROUTINE    Collection Time: 09/18/23 10:24 PM    Specimen: Urine, Site not specified   Culture Result Status    URINE CULTURE 100,000 Escherichia coli (A) Final       Susceptibility    Escherichia coli -  (no method available)     Ampicillin <=2 Sensitive      Ampicillin/Sulbactam <=2 Sensitive      Cefazolin <=4 Sensitive      Cefepime <=0.12 Sensitive      Ceftazidime <=1 Sensitive      Ceftriaxone <=0.25 Sensitive      Ciprofloxacin <=0.25 Sensitive      Ertapenem <=0.12 Sensitive      ESBL Negative       Gentamicin <=1 Sensitive      Imipenem <=0.25 Sensitive      Levofloxacin <=0.12 Sensitive      Nitrofurantoin <=16 Sensitive      Piperacillin/Tazobactam <=4 Sensitive      Tobramycin <=1 Sensitive      Trimethoprim/Sulfamethoxazole <=20 Sensitive        Radiology:       Marylee Floras, MD    09/21/2023   10:38  [facility]  [facility]

## 2023-09-21 NOTE — Care Plan (Signed)
 Problem: Wound  Goal: Optimal Coping  Outcome: Ongoing (see interventions/notes)  Goal: Optimal Functional Ability  Outcome: Ongoing (see interventions/notes)  Goal: Absence of Infection Signs and Symptoms  Outcome: Ongoing (see interventions/notes)  Goal: Improved Oral Intake  Outcome: Ongoing (see interventions/notes)  Goal: Optimal Pain Control and Function  Outcome: Ongoing (see interventions/notes)  Goal: Skin Health and Integrity  Outcome: Ongoing (see interventions/notes)  Goal: Optimal Wound Healing  Outcome: Ongoing (see interventions/notes)

## 2023-09-21 NOTE — Nurses Notes (Signed)
 Patient has been alert and oriented. This evening she started to get confused and disoriented to time. Free of falls. Denies any complaints of pain. Patient was hypotensive this morning with a  manual BP of 92/42. Dr. Rip Harbour was notified. Patient received a LR bolus. IV remains patent. IV ABX tolerated well. Patients tongue is also black; pictured placed on the chart and attending notified. Nystatin mouth wash was ordered. Currently resting in bed. Call light within reach. Bed alarms utilized.     BP (!) 108/52   Pulse 68   Temp 36.6 C (97.9 F)   Resp 17   Ht 1.575 m (5\' 2" )   Wt (!) 30.8 kg (67 lb 14.4 oz)   LMP  (LMP Unknown)   SpO2 97%   BMI 12.42 kg/m     I have reviewed this patients plan of care and will continue to monitor.     Lambert Keto, LPN

## 2023-09-21 NOTE — Nurses Notes (Signed)
 Dr. Rip Harbour notified that the anterior surface of patient's tongue's surface is black in appearance.  Patient does not report pain associated with it.  Order received for nystatin swish and swallow 5 ml q 6 hours, first dose stat.Glenis Smoker, RN

## 2023-09-21 NOTE — Care Plan (Signed)
 Problem: Wound  Goal: Optimal Coping  Outcome: Ongoing (see interventions/notes)  Intervention: Support Patient and Family Response  Recent Flowsheet Documentation  Taken 09/21/2023 0812 by Rozelle Logan, LPN  Supportive Measures:   active listening utilized   self-care encouraged  Goal: Optimal Functional Ability  Outcome: Ongoing (see interventions/notes)  Intervention: Optimize Functional Ability  Recent Flowsheet Documentation  Taken 09/21/2023 1000 by Rozelle Logan, LPN  Activity Management: ROM, active encouraged  Activity Assistance Provided: assistance, 1 person  Taken 09/21/2023 0812 by Rozelle Logan, LPN  Activity Management: ROM, active encouraged  Activity Assistance Provided: assistance, 1 person  Goal: Absence of Infection Signs and Symptoms  Outcome: Ongoing (see interventions/notes)  Intervention: Prevent or Manage Infection  Recent Flowsheet Documentation  Taken 09/21/2023 1610 by Rozelle Logan, LPN  Fever Reduction/Comfort Measures:   lightweight bedding   lightweight clothing  Goal: Improved Oral Intake  Outcome: Ongoing (see interventions/notes)  Goal: Optimal Pain Control and Function  Outcome: Ongoing (see interventions/notes)  Intervention: Prevent or Manage Pain  Recent Flowsheet Documentation  Taken 09/21/2023 0812 by Rozelle Logan, LPN  Sleep/Rest Enhancement: regular sleep/rest pattern promoted  Goal: Skin Health and Integrity  Outcome: Ongoing (see interventions/notes)  Intervention: Optimize Skin Protection  Recent Flowsheet Documentation  Taken 09/21/2023 1000 by Rozelle Logan, LPN  Activity Management: ROM, active encouraged  Head of Bed [facility]) Positioning: HOB elevated  Taken 09/21/2023 9604 by Rozelle Logan, LPN  Pressure Reduction Techniques: Frequent weight shifting encouraged  Pressure Reduction Devices: Repositioning wedges/pillows utilized  Activity Management: ROM, active encouraged  Head of Bed (HOB) Positioning: HOB elevated  Goal: Optimal Wound Healing  Outcome: Ongoing (see  interventions/notes)  Intervention: Promote Wound Healing  Recent Flowsheet Documentation  Taken 09/21/2023 1000 by Rozelle Logan, LPN  Activity Management: ROM, active encouraged  Taken 09/21/2023 0812 by Rozelle Logan, LPN  Pressure Reduction Techniques: Frequent weight shifting encouraged  Pressure Reduction Devices: Repositioning wedges/pillows utilized  Sleep/Rest Enhancement: regular sleep/rest pattern promoted  Activity Management: ROM, active encouraged     Problem: Health Knowledge, Opportunity to Enhance (Adult,Obstetrics,Pediatric)  Goal: Knowledgeable about Health Subject/Topic  Description: Patient will demonstrate the desired outcomes by discharge/transition of care.  Outcome: Ongoing (see interventions/notes)  Intervention: Enhance Health Knowledge  Recent Flowsheet Documentation  Taken 09/21/2023 5409 by Rozelle Logan, LPN  Supportive Measures:   active listening utilized   self-care encouraged     Problem: Fall Injury Risk  Goal: Absence of Fall and Fall-Related Injury  Outcome: Ongoing (see interventions/notes)  Intervention: Identify and Manage Contributors  Recent Flowsheet Documentation  Taken 09/21/2023 0812 by Rozelle Logan, LPN  Self-Care Promotion:   independence encouraged   BADL personal objects within reach  Medication Review/Management: medications reviewed  Intervention: Promote Injury-Free Environment  Recent Flowsheet Documentation  Taken 09/21/2023 1000 by Rozelle Logan, LPN  Safety Promotion/Fall Prevention:   activity supervised   safety round/check completed  Taken 09/21/2023 0812 by Rozelle Logan, LPN  Safety Promotion/Fall Prevention:   activity supervised   nonskid shoes/slippers when out of bed   safety round/check completed     Problem: Skin Injury Risk Increased  Goal: Skin Health and Integrity  Outcome: Ongoing (see interventions/notes)  Intervention: Optimize Skin Protection  Recent Flowsheet Documentation  Taken 09/21/2023 1000 by Rozelle Logan, LPN  Activity Management: ROM, active encouraged  Head of Bed  [facility]) Positioning: HOB elevated  Taken 09/21/2023 8119 by Rozelle Logan, LPN  Pressure Reduction Techniques: Frequent weight shifting encouraged  Pressure Reduction Devices: Repositioning wedges/pillows utilized  Skin Protection:  adhesive use limited   protective decubiti skin protection foam dressing not able to be applied (comment required)   preventative decubiti skin protection foam dressing applied/intact  Activity Management: ROM, active encouraged  Head of Bed (HOB) Positioning: HOB elevated     Problem: Adult Inpatient Plan of Care  Goal: Plan of Care Review  Outcome: Ongoing (see interventions/notes)  Goal: Patient-Specific Goal (Individualized)  Outcome: Ongoing (see interventions/notes)  Goal: Absence of Hospital-Acquired Illness or Injury  Outcome: Ongoing (see interventions/notes)  Intervention: Identify and Manage Fall Risk  Recent Flowsheet Documentation  Taken 09/21/2023 1000 by Rozelle Logan, LPN  Safety Promotion/Fall Prevention:   activity supervised   safety round/check completed  Taken 09/21/2023 1308 by Rozelle Logan, LPN  Safety Promotion/Fall Prevention:   activity supervised   nonskid shoes/slippers when out of bed   safety round/check completed  Intervention: Prevent Skin Injury  Recent Flowsheet Documentation  Taken 09/21/2023 1000 by Rozelle Logan, LPN  Body Position: supine, head elevated  Taken 09/21/2023 6578 by Rozelle Logan, LPN  Body Position: supine, head elevated  Skin Protection:   adhesive use limited   protective decubiti skin protection foam dressing not able to be applied (comment required)   preventative decubiti skin protection foam dressing applied/intact  Intervention: Prevent and Manage VTE (Venous Thromboembolism) Risk  Recent Flowsheet Documentation  Taken 09/21/2023 0812 by Rozelle Logan, LPN  VTE Prevention/Management:   ambulation promoted   anticoagulant therapy maintained  Goal: Optimal Comfort and Wellbeing  Outcome: Ongoing (see interventions/notes)  Intervention: Provide Person-Centered  Care  Recent Flowsheet Documentation  Taken 09/21/2023 4696 by Rozelle Logan, LPN  Trust Relationship/Rapport:   care explained   thoughts/feelings acknowledged   questions encouraged   questions answered  Goal: Rounds/Family Conference  Outcome: Ongoing (see interventions/notes)     Problem: Gas Exchange Impaired  Goal: Optimal Gas Exchange  Outcome: Ongoing (see interventions/notes)  Intervention: Optimize Oxygenation and Ventilation  Recent Flowsheet Documentation  Taken 09/21/2023 1000 by Rozelle Logan, LPN  Head of Bed [facility]) Positioning: HOB elevated  Taken 09/21/2023 2952 by Rozelle Logan, LPN  Head of Bed [facility]) Positioning: HOB elevated     Problem: Pneumonia  Goal: Fluid Balance  Outcome: Ongoing (see interventions/notes)  Goal: Absence of Infection Signs and Symptoms  Outcome: Ongoing (see interventions/notes)  Intervention: Prevent Infection Progression  Recent Flowsheet Documentation  Taken 09/21/2023 8413 by Rozelle Logan, LPN  Fever Reduction/Comfort Measures:   lightweight bedding   lightweight clothing  Goal: Effective Oxygenation and Ventilation  Outcome: Ongoing (see interventions/notes)  Intervention: Optimize Oxygenation and Ventilation  Recent Flowsheet Documentation  Taken 09/21/2023 1000 by Rozelle Logan, LPN  Head of Bed [facility]) Positioning: HOB elevated  Taken 09/21/2023 2440 by Rozelle Logan, LPN  Head of Bed [facility]) Positioning: HOB elevated     Problem: Fatigue  Goal: Improved Activity Tolerance  Outcome: Ongoing (see interventions/notes)  Intervention: Promote Improved Energy  Recent Flowsheet Documentation  Taken 09/21/2023 1000 by Rozelle Logan, LPN  Activity Management: ROM, active encouraged  Taken 09/21/2023 0812 by Rozelle Logan, LPN  Fatigue Management: activity assistance provided  Sleep/Rest Enhancement: regular sleep/rest pattern promoted  Activity Management: ROM, active encouraged     Problem: Heart Failure Comorbidity  Goal: Maintenance of Heart Failure Symptom Control  Outcome: Ongoing (see  interventions/notes)  Intervention: Maintain Heart Failure Management  Recent Flowsheet Documentation  Taken 09/21/2023 1027 by Rozelle Logan, LPN  Medication Review/Management: medications reviewed

## 2023-09-21 NOTE — Pharmacy (Signed)
 [facility] Pharmacy Department  Doxycycline 5-Day Duration Automatic Stop                                                                                                                  Patient Name: [name]  Date of Birth: 1934-10-30  MRN: [id number]  [employer] Antimicrobial Stewardship (AMS) and Pharmacy & Therapeutics (P&T) Committees have accepted a protocol allowing for automatic stop dates to be entered for Doxycycline when used for respiratory infections.     Exclusions for this protocol include:   - Patients < 56 years of age  - Legionella positive cultures or urinary Legionella antigen test  - Acute investigation or high suspicion of Legionella infections  - Treatment for indications other than respiratory infections  - Continuation of home medication orders  - Positive MRSA PCR nasal swabs  - Positive respiratory culture for MRSA    The AMS team has identified this patient as a candidate for this protocol. A 5-day total duration has been added to the medication order per the protocol.     Please contact a member of the AMS pharmacy staff at #[id number], [phone], or [phone] with questions.     Marjean Donna, [facility]  09/21/2023  13:22

## 2023-09-21 NOTE — Progress Notes (Addendum)
 [facility]  IP PROGRESS NOTE      [name], [name]  Date of Admission:  09/18/2023  Date of Birth:  1934/12/20  Date of Service:  09/21/2023    Assessment/ Plan:   Community-acquired pneumonia-day 4 of broad antibiotics.  Close follow-up on cultures and adjust accordingly.  Patient shows signs of improvement   Urinary tract infection-day 4 of broad antibiotics.  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  No breakthrough symptoms.  Continue to monitor closely and adjust accordingly   Chronic constipation-stable, continue as is   Mixed hyperlipidemia-stable, continue as is   Primary hypertension-stable, continue as is   Acquired hypothyroidism-stable, continue as is   Gastroesophageal reflux disease-stable, continue as is     Nutrition: Malnutrition Severity: Severe protein calorie malnutrition  DIET CARDIAC (2G NA, LOW FAT, LOW CHOL, LOW CAFFEINE) Consistency/thickening: Solid: PUREE  MNT PROTOCOL FOR DIETITIAN  DIETARY ORAL SUPPLEMENTS Product Name: Mighty Shake-Chocolate; BREAKFAST &2PM; 1 Each  -monitor for weight change  -monitor intake and output  -monitor bowel functions  -Patient meets criteria based on the following clinical characteristics:  Malnutrition (Undernutrition) Diagnosis  Malnutrition Chronicity: Chronic illness  Malnutrition Severity: Severe protein calorie malnutrition  Related to: Inadequate protein and enregy intake  Energy Intake: Less than 75% of EER in greater than or equal to 3 months  Weight Loss:  (4# 5.6% x 6 weeks and 10# x 2 mo 14%.)  Body Fat Loss: Severe (Subcutaneous fat loss (3+): orbital 3+, cheek 3+, tricep 3+)  Muscle Mass Loss: Severe (Muscle mass loss (3+): temple 3+, clavicle 3+, shoulder 3+, scapula n/a, hand 3+, thigh 2+, calf 3+)  Other Factors: Pneumonia, CHF (BMI 12.4)  Nutrition Treatment Plan: Initiate oral supplement, Liberalize diet to  increase po intake, Monitor weight trends (consider appetite stimulant if oral intake is inadequate)     Muscular deconditioning-consult Physical therapy    Peptic ulcer disease, deep vein thrombosis and atelectasis prophylaxis   Discharge plans pending -possibly 09/23/2023    Problem List:  Active Hospital Problems   (*Primary Problem)    Diagnosis    *Pneumonia     DVT/PE Prophylaxis: Enoxaparin  Disposition Planning: Home discharge     Advance Care Planning Discussed:  No    Subjective    Patient continues to note cough productive of yellowish sputum but clearing by her report.  Tolerating diet with normal bowel movement reported yesterday.  No other complaints other than generalized weakness which is worse with movement and relieved with rest.  Denies chills or sweats.  Compliant with medical regimen without complication.      Cardiovascular-the patient denies chest pain, palpitations, PND, orthopnea, or peripheral edema.  Pulmonary-patient denies shortness of breath, dyspnea on exertion or hemoptysis.  See HPI  Neurologic-patient denies hemiparesis, paresthesias, dysesthesias, dysarthrias, balance problems, syncopal episodes, seizure disorder or headache.  GI-patient denies nausea, vomiting, hematemesis, melena, hematochezia, diarrhea, constipation or other abdominal complaints.  GU-patient denies dysuria, hematuria, urinary frequency, kidney stone, kidney infection, flank pain, fever, chills, diaphoresis or night sweats.      Current Medications:  acetaminophen (TYLENOL) tablet, 650 mg, Oral, Q6H PRN  aspirin (ECOTRIN) enteric coated tablet 81 mg, 81 mg, Oral, Daily  atorvastatin (LIPITOR) tablet, 40 mg, Oral, QPM  carvedilol (COREG) tablet, 3.125 mg, Oral, Daily  cefTRIAXone (ROCEPHIN) injection 2 g, 2 g, Intravenous, Q24H  D5W 250 mL flush bag, ,  Intravenous, Q15 Min PRN  doxycycline hyclate (VIBRAMYCIN) capsule, 100 mg, Oral, 2x/day  enoxaparin PF (LOVENOX) 30 mg/0.3 mL SubQ injection, 30 mg, Subcutaneous,  Q24H  [Held by provider] furosemide (LASIX) tablet, 40 mg, Oral, 2x/day  guaiFENesin (MUCINEX) extended release tablet - for cough (expectorant), 600 mg, Oral, 2x/day  ipratropium-albuterol 0.5 mg-3 mg(2.5 mg base)/3 mL Solution for Nebulization, 3 mL, Nebulization, 4x/day  lactulose (ENULOSE) 10g per 15mL oral liquid, 30 mL, Oral, 2x/day  levothyroxine (SYNTHROID) tablet, 75 mcg, Oral, QAM  [Held by provider] lisinopril (PRINIVIL) tablet, 2.5 mg, Oral, Daily  metoclopramide (REGLAN) tablet, 5 mg, Oral, 3x/day AC  NS 250 mL flush bag, , Intravenous, Q15 Min PRN  NS flush syringe, 3 mL, Intracatheter, Q8HRS  NS flush syringe, 3 mL, Intracatheter, Q1H PRN  ondansetron (ZOFRAN) 2 mg/mL injection, 4 mg, Intravenous, Q6H PRN  [Held by provider] potassium chloride (KCLOR-CON) extended release tablet, 10 mEq, Oral, 2x/day-Food  sennosides-docusate sodium (SENOKOT-S) 8.6-50mg  per tablet, 1 Tablet, Oral, 2x/day  simethicone (MYLICON) chewable tablet, 160 mg, Oral, 4x/day          Objective     Vital Signs:  Temp (24hrs) Max:36.7 C (98.1 F)      Temperature: 36.7 C (98.1 F)  BP (Non-Invasive): (!) 88/44  MAP (Non-Invasive): (!) 54 mmHG  Heart Rate: 74  Respiratory Rate: 18  SpO2: 100 %    Today's Physical Exam:  Constitutional: no distress  Eyes: Conjunctiva clear.  ENT: ENMT without erythema or injection, mucous membranes moist.  Neck: supple, symmetrical, trachea midline and no adenopathy  Respiratory:  Scattered crackles noted on the right lung fields, none noted on the left.  Breath sounds are equal and unlabored.  No wheezes noted.  Cardiovascular: regular rate and rhythm, S1, S2 normal, no murmur, click, rub or gallop  No JVD  Homans sign is negative, no sign of DVT  pedal edema 0+ bilateral  Gastrointestinal: Soft, non-tender, Bowel sounds normal, non-distended, No hepatosplenomegaly, No masses, Abdominal bruit absent  Genitourinary: Deferred  Musculoskeletal: Head atraumatic and normocephalic and Other joints are  grossly normal  Integumentary:  Skin warm and dry  Neurologic: Grossly normal  Lymphatic/Immunologic/Hematologic: No lymphadenopathy  Psychiatric: Normal      I/O:  I/O last 24 hours:    Intake/Output Summary (Last 24 hours) at 09/21/2023 0709  Last data filed at 09/21/2023 0100  Gross per 24 hour   Intake 780 ml   Output --   Net 780 ml     I/O current shift:  No intake/output data recorded.    Nutrition/Residuals:  DIET CARDIAC (2G NA, LOW FAT, LOW CHOL, LOW CAFFEINE) Consistency/thickening: Solid: PUREE  MNT PROTOCOL FOR DIETITIAN  DIETARY ORAL SUPPLEMENTS Product Name: Mighty Shake-Chocolate; BREAKFAST &2PM; 1 Each    Labs  Please indicate ordered or reviewed)  Reviewed: Lab Results Today:    Results for orders placed or performed during the hospital encounter of 09/18/23 (from the past 24 hours)   CBC   Result Value Ref Range    WBC 11.1 (H) 3.7 - 11.0 x10^3/uL    RBC 3.24 (L) 3.85 - 5.22 x10^6/uL    HGB 10.5 (L) 11.5 - 16.0 g/dL    HCT 16.1 (L) 09.6 - 46.0 %    MCV 97.2 78.0 - 100.0 fL    MCH 32.4 (H) 26.0 - 32.0 pg    MCHC 33.3 31.0 - 35.5 g/dL    RDW-CV 04.5 40.9 - 81.1 %    PLATELETS 325 150 -  400 x10^3/uL    MPV 9.0 8.7 - 12.5 fL   BASIC METABOLIC PANEL   Result Value Ref Range    SODIUM 139 136 - 145 mmol/L    POTASSIUM 3.9 3.5 - 5.1 mmol/L    CHLORIDE 95 (L) 96 - 111 mmol/L    CO2 TOTAL 34 (H) 23 - 31 mmol/L    ANION GAP 10 4 - 13 mmol/L    CALCIUM 8.8 8.6 - 10.3 mg/dL    GLUCOSE 90 65 - 161 mg/dL    BUN 37 (H) 8 - 25 mg/dL    CREATININE 0.96 0.45 - 1.05 mg/dL    BUN/CREA RATIO 39 (H) 6 - 22    ESTIMATED GFR - FEMALE 59 (L) >=60 mL/min/BSA   MAGNESIUM   Result Value Ref Range    MAGNESIUM 2.2 1.8 - 2.6 mg/dL   PHOSPHORUS   Result Value Ref Range    PHOSPHORUS 2.5 2.3 - 4.0 mg/dL     Ordered:      Diagnostic Tests (Please indicate ordered or reviewed)  Reviewed:   Ordered:     Radiology Tests (Please indicate ordered or reviewed)  Reviewed:   Results for orders placed or performed during the hospital  encounter of 09/18/23   XR AP MOBILE CHEST     Status: None    Narrative    Torianna L Dix    PROCEDURE DESCRIPTION: XR AP MOBILE CHEST    CLINICAL INDICATION: dyspnea tachypnea    TECHNIQUE: 1 views / 1 images submitted.    COMPARISON: 08/09/2023      FINDINGS: Unchanged heart size. Small left pleural effusion, similar to prior examination. No right pleural effusion. No airspace opacity or pulmonary edema.       Impression    Small left pleural effusion, similar to prior. No acute abnormality.                Radiologist location ID: [id number]     CTA CHEST FOR PULMONARY EMBOLUS AND CT ABD/PEL W IV CONTRAST     Status: None    Narrative    Pharrah L Flythe    PROCEDURE DESCRIPTION: CTA CHEST FOR PULMONARY EMBOLUS AND CT ABD/PEL W IV CONTRAST    CTA chest was performed to evaluate for pulmonary embolism with 3D/MIP reconstructions generated.    CLINICAL INDICATION: hypoxic; upper abd pain with nausea/decreased PO intake, leukocytosis, eval for pneumonia, edema, PE or intraabd source of infection    COMPARISON: 08/07/2023      FINDINGS: Study is of good diagnostic quality.    No filling defects are identified within the pulmonary arteries. The main pulmonary artery is normal in size. The heart size is normal. There is no pericardial effusion. 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    Patchy opacity is present within the right lower lobe. Small left pleural effusion is present with associated left lower lobe atelectasis. Lungs are otherwise clear.    The liver is normal in appearance. No biliary ductal dilation is identified. The spleen, pancreas and adrenal glands are normal in appearance. Kidneys are normal in appearance with no hydronephrosis or stone.     The stomach, small bowel and colon are normal in appearance. Extensive stool burden is noted within the colon. There is no free air or fluid. The bladder is normal in appearance. There is no abdominopelvic adenopathy.    There is no acute  osseous abnormality. Significant skeletal deformity and scoliosis are again noted with associated  multilevel chronic compression fractures.      Impression    1. No pulmonary embolism.  2. Patchy right lower lobe airspace disease.  3. Small left pleural effusion with associated left lower lobe atelectasis.  4. No acute intra-abdominal abnormality identified.            The CT exam was performed using one or more of the following dose reduction techniques: Automated exposure control, adjustment of the mA and/or kV according to the patient's size, or use of iterative reconstruction technique.        Radiologist location ID: [id number]       More than 35 minutes were spent reviewing the patient's records, acquiring history, performing physical exam, reviewing labs and x-ray reports, reviewing procedures as applicable, ordering medications as applicable, educating the patient in plain English, coordinating care with staff and other providers as applicable, independent interpretation of lab results as applicable, medical decision making, post-visit activities and documentation of the above.  Old medical records were reviewed as available and no other relevant information was obtained.    This report was generated using voice recognition software, and is inherently subject to errors including those of syntax and "sound-alike " substitutions which may escape proof reading. In such instances, original meaning may be extrapolated by contextual derivation.    Ordered:      Chevis Pretty, MD

## 2023-09-22 ENCOUNTER — Other Ambulatory Visit: Payer: Self-pay

## 2023-09-22 ENCOUNTER — Encounter (HOSPITAL_COMMUNITY): Payer: Self-pay | Admitting: INTERNAL MEDICINE

## 2023-09-22 DIAGNOSIS — B370 Candidal stomatitis: Secondary | ICD-10-CM

## 2023-09-22 DIAGNOSIS — F0390 Unspecified dementia without behavioral disturbance: Secondary | ICD-10-CM

## 2023-09-22 DIAGNOSIS — D649 Anemia, unspecified: Secondary | ICD-10-CM

## 2023-09-22 LAB — CBC
HCT: 31.8 % — ABNORMAL LOW (ref 34.8–46.0)
HGB: 10.3 g/dL — ABNORMAL LOW (ref 11.5–16.0)
MCH: 31.1 pg (ref 26.0–32.0)
MCHC: 32.4 g/dL (ref 31.0–35.5)
MCV: 96.1 fL (ref 78.0–100.0)
MPV: 10 fL (ref 8.7–12.5)
PLATELETS: 324 10*3/uL (ref 150–400)
RBC: 3.31 10*6/uL — ABNORMAL LOW (ref 3.85–5.22)
RDW-CV: 13.7 % (ref 11.5–15.5)
WBC: 8 10*3/uL (ref 3.7–11.0)

## 2023-09-22 LAB — RETICULOCYTE COUNT
IMMATURE RETIC FRACTION: 3.2 % (ref 2.7–15.9)
RETICULOCYTE % AUTOMATED: 0.86 % (ref 0.50–2.20)
RETICULOCYTE HEMOGLOBIN EQUIVALENT: 29.2 pg (ref 28.0–38.0)
RETICULOCYTES COUNT # AUTOMATED: 28.1 10*3/uL (ref 17.0–100.0)

## 2023-09-22 LAB — COMPREHENSIVE METABOLIC PANEL, NON-FASTING
ALBUMIN: 2.2 g/dL — ABNORMAL LOW (ref 3.4–4.8)
ALKALINE PHOSPHATASE: 70 U/L (ref 55–145)
ALT (SGPT): 9 U/L (ref 8–22)
ANION GAP: 7 mmol/L (ref 4–13)
AST (SGOT): 14 U/L (ref 8–45)
BILIRUBIN TOTAL: 0.2 mg/dL — ABNORMAL LOW (ref 0.3–1.3)
BUN/CREA RATIO: 36 — ABNORMAL HIGH (ref 6–22)
BUN: 26 mg/dL — ABNORMAL HIGH (ref 8–25)
CALCIUM: 8.8 mg/dL (ref 8.6–10.3)
CHLORIDE: 97 mmol/L (ref 96–111)
CO2 TOTAL: 35 mmol/L — ABNORMAL HIGH (ref 23–31)
CREATININE: 0.72 mg/dL (ref 0.60–1.05)
ESTIMATED GFR - FEMALE: 81 mL/min/BSA (ref >=60)
GLUCOSE: 86 mg/dL (ref 65–125)
POTASSIUM: 4.2 mmol/L (ref 3.5–5.1)
PROTEIN TOTAL: 5.5 g/dL — ABNORMAL LOW (ref 6.0–8.0)
SODIUM: 139 mmol/L (ref 136–145)

## 2023-09-22 LAB — FERRITIN: FERRITIN: 416 ng/mL — ABNORMAL HIGH (ref 5–200)

## 2023-09-22 LAB — VITAMIN B12: VITAMIN B 12: 253 pg/mL (ref 200–900)

## 2023-09-22 LAB — IRON TRANSFERRIN AND TIBC
IRON (TRANSFERRIN) SATURATION: 27 % (ref 20–50)
IRON: 46 ug/dL (ref 45–170)
TOTAL IRON BINDING CAPACITY: 169 ug/dL — ABNORMAL LOW (ref 224–476)
TRANSFERRIN: 121 mg/dL — ABNORMAL LOW (ref 160–340)

## 2023-09-22 LAB — PHOSPHORUS: PHOSPHORUS: 2.1 mg/dL — ABNORMAL LOW (ref 2.3–4.0)

## 2023-09-22 LAB — MAGNESIUM: MAGNESIUM: 2.1 mg/dL (ref 1.8–2.6)

## 2023-09-22 MED ORDER — SODIUM CHLORIDE 0.9 % INTRAVENOUS SOLUTION
15.0000 mmol | Freq: Once | INTRAVENOUS | Status: AC
Start: 2023-09-22 — End: 2023-09-22
  Administered 2023-09-22: 0 mmol via INTRAVENOUS
  Administered 2023-09-22: 15 mmol via INTRAVENOUS
  Filled 2023-09-22: qty 15

## 2023-09-22 MED ORDER — SODIUM CHLORIDE 0.9% INTRAVENOUS BAG/VIAL WRAPPER FOR MIXTURES
1.0000 mL | Freq: Once | INTRAMUSCULAR | Status: AC
Start: 2023-09-22 — End: 2023-09-22
  Administered 2023-09-22: 1 mL via SUBCUTANEOUS
  Filled 2023-09-22 (×2): qty 0.1

## 2023-09-22 MED ORDER — FUROSEMIDE 20 MG TABLET
20.0000 mg | ORAL_TABLET | Freq: Every day | ORAL | Status: DC
Start: 2023-09-23 — End: 2023-09-22

## 2023-09-22 MED ORDER — FUROSEMIDE 20 MG TABLET
20.0000 mg | ORAL_TABLET | Freq: Every day | ORAL | Status: DC
Start: 2023-09-22 — End: 2023-09-24
  Administered 2023-09-22 – 2023-09-24 (×3): 20 mg via ORAL
  Filled 2023-09-22 (×3): qty 1

## 2023-09-22 NOTE — Care Plan (Signed)
 Problem: Wound  Goal: Optimal Coping  Outcome: Ongoing (see interventions/notes)  Intervention: Support Patient and Family Response  Recent Flowsheet Documentation  Taken 09/22/2023 0923 by Rozelle Logan, LPN  Supportive Measures: active listening utilized  Goal: Optimal Functional Ability  Outcome: Ongoing (see interventions/notes)  Intervention: Optimize Functional Ability  Recent Flowsheet Documentation  Taken 09/22/2023 1600 by Rozelle Logan, LPN  Activity Management: ROM, active encouraged  Activity Assistance Provided: assistance, 1 person  Taken 09/22/2023 1200 by Rozelle Logan, LPN  Activity Management: ROM, active encouraged  Activity Assistance Provided: assistance, 1 person  Taken 09/22/2023 0923 by Rozelle Logan, LPN  Activity Management: ROM, active encouraged  Activity Assistance Provided: assistance, 1 person  Goal: Absence of Infection Signs and Symptoms  Outcome: Ongoing (see interventions/notes)  Intervention: Prevent or Manage Infection  Recent Flowsheet Documentation  Taken 09/22/2023 0923 by Rozelle Logan, LPN  Fever Reduction/Comfort Measures:   lightweight bedding   lightweight clothing  Goal: Improved Oral Intake  Outcome: Ongoing (see interventions/notes)  Goal: Optimal Pain Control and Function  Outcome: Ongoing (see interventions/notes)  Intervention: Prevent or Manage Pain  Recent Flowsheet Documentation  Taken 09/22/2023 0923 by Rozelle Logan, LPN  Sleep/Rest Enhancement: regular sleep/rest pattern promoted  Goal: Skin Health and Integrity  Outcome: Ongoing (see interventions/notes)  Intervention: Optimize Skin Protection  Recent Flowsheet Documentation  Taken 09/22/2023 1600 by Rozelle Logan, LPN  Activity Management: ROM, active encouraged  Head of Bed [facility]) Positioning: HOB elevated  Taken 09/22/2023 1200 by Rozelle Logan, LPN  Activity Management: ROM, active encouraged  Head of Bed [facility]) Positioning: HOB elevated  Taken 09/22/2023 1610 by Rozelle Logan, LPN  Pressure Reduction Techniques: Frequent weight shifting  encouraged  Pressure Reduction Devices: Repositioning wedges/pillows utilized  Activity Management: ROM, active encouraged  Head of Bed (HOB) Positioning: HOB elevated  Goal: Optimal Wound Healing  Outcome: Ongoing (see interventions/notes)  Intervention: Promote Wound Healing  Recent Flowsheet Documentation  Taken 09/22/2023 1600 by Rozelle Logan, LPN  Activity Management: ROM, active encouraged  Taken 09/22/2023 1200 by Rozelle Logan, LPN  Activity Management: ROM, active encouraged  Taken 09/22/2023 0923 by Rozelle Logan, LPN  Pressure Reduction Techniques: Frequent weight shifting encouraged  Pressure Reduction Devices: Repositioning wedges/pillows utilized  Sleep/Rest Enhancement: regular sleep/rest pattern promoted  Activity Management: ROM, active encouraged     Problem: Health Knowledge, Opportunity to Enhance (Adult,Obstetrics,Pediatric)  Goal: Knowledgeable about Health Subject/Topic  Description: Patient will demonstrate the desired outcomes by discharge/transition of care.  Outcome: Ongoing (see interventions/notes)  Intervention: Enhance Health Knowledge  Recent Flowsheet Documentation  Taken 09/22/2023 0923 by Rozelle Logan, LPN  Supportive Measures: active listening utilized     Problem: Fall Injury Risk  Goal: Absence of Fall and Fall-Related Injury  Outcome: Ongoing (see interventions/notes)  Intervention: Identify and Manage Contributors  Recent Flowsheet Documentation  Taken 09/22/2023 1200 by Rozelle Logan, LPN  Medication Review/Management: medications reviewed  Taken 09/22/2023 0923 by Rozelle Logan, LPN  Self-Care Promotion:   BADL personal objects within reach   independence encouraged  Intervention: Promote Injury-Free Environment  Recent Flowsheet Documentation  Taken 09/22/2023 1800 by Rozelle Logan, LPN  Safety Promotion/Fall Prevention:   activity supervised   safety round/check completed  Taken 09/22/2023 1600 by Rozelle Logan, LPN  Safety Promotion/Fall Prevention:   activity supervised   safety round/check completed  Taken  09/22/2023 1400 by Rozelle Logan, LPN  Safety Promotion/Fall Prevention:   activity supervised   safety round/check completed  Taken 09/22/2023 1200 by Rozelle Logan, LPN  Safety Promotion/Fall  Prevention:   activity supervised   safety round/check completed  Taken 09/22/2023 1000 by Rozelle Logan, LPN  Safety Promotion/Fall Prevention:   activity supervised   safety round/check completed  Taken 09/22/2023 0923 by Rozelle Logan, LPN  Safety Promotion/Fall Prevention:   activity supervised   safety round/check completed     Problem: Skin Injury Risk Increased  Goal: Skin Health and Integrity  Outcome: Ongoing (see interventions/notes)  Intervention: Optimize Skin Protection  Recent Flowsheet Documentation  Taken 09/22/2023 1600 by Rozelle Logan, LPN  Activity Management: ROM, active encouraged  Head of Bed [facility]) Positioning: HOB elevated  Taken 09/22/2023 1200 by Rozelle Logan, LPN  Activity Management: ROM, active encouraged  Head of Bed [facility]) Positioning: HOB elevated  Taken 09/22/2023 0923 by Rozelle Logan, LPN  Pressure Reduction Techniques: Frequent weight shifting encouraged  Pressure Reduction Devices: Repositioning wedges/pillows utilized  Skin Protection: adhesive use limited  Activity Management: ROM, active encouraged  Head of Bed (HOB) Positioning: HOB elevated     Problem: Adult Inpatient Plan of Care  Goal: Plan of Care Review  Outcome: Ongoing (see interventions/notes)  Goal: Patient-Specific Goal (Individualized)  Outcome: Ongoing (see interventions/notes)  Goal: Absence of Hospital-Acquired Illness or Injury  Outcome: Ongoing (see interventions/notes)  Intervention: Identify and Manage Fall Risk  Recent Flowsheet Documentation  Taken 09/22/2023 1800 by Rozelle Logan, LPN  Safety Promotion/Fall Prevention:   activity supervised   safety round/check completed  Taken 09/22/2023 1600 by Rozelle Logan, LPN  Safety Promotion/Fall Prevention:   activity supervised   safety round/check completed  Taken 09/22/2023 1400 by Rozelle Logan, LPN  Safety  Promotion/Fall Prevention:   activity supervised   safety round/check completed  Taken 09/22/2023 1200 by Rozelle Logan, LPN  Safety Promotion/Fall Prevention:   activity supervised   safety round/check completed  Taken 09/22/2023 1000 by Rozelle Logan, LPN  Safety Promotion/Fall Prevention:   activity supervised   safety round/check completed  Taken 09/22/2023 0923 by Rozelle Logan, LPN  Safety Promotion/Fall Prevention:   activity supervised   safety round/check completed  Intervention: Prevent Skin Injury  Recent Flowsheet Documentation  Taken 09/22/2023 1600 by Rozelle Logan, LPN  Body Position: supine, head elevated  Taken 09/22/2023 1200 by Rozelle Logan, LPN  Body Position: supine, head elevated  Taken 09/22/2023 0923 by Rozelle Logan, LPN  Body Position: supine, head elevated  Skin Protection: adhesive use limited  Intervention: Prevent and Manage VTE (Venous Thromboembolism) Risk  Recent Flowsheet Documentation  Taken 09/22/2023 0923 by Rozelle Logan, LPN  VTE Prevention/Management: ambulation promoted  Goal: Optimal Comfort and Wellbeing  Outcome: Ongoing (see interventions/notes)  Intervention: Provide Person-Centered Care  Recent Flowsheet Documentation  Taken 09/22/2023 1610 by Rozelle Logan, LPN  Trust Relationship/Rapport:   care explained   thoughts/feelings acknowledged   questions encouraged   questions answered  Goal: Rounds/Family Conference  Outcome: Ongoing (see interventions/notes)     Problem: Gas Exchange Impaired  Goal: Optimal Gas Exchange  Outcome: Ongoing (see interventions/notes)  Intervention: Optimize Oxygenation and Ventilation  Recent Flowsheet Documentation  Taken 09/22/2023 1600 by Rozelle Logan, LPN  Head of Bed [facility]) Positioning: HOB elevated  Taken 09/22/2023 1200 by Rozelle Logan, LPN  Head of Bed [facility]) Positioning: HOB elevated  Taken 09/22/2023 9604 by Rozelle Logan, LPN  Head of Bed (HOB) Positioning: HOB elevated     Problem: Pneumonia  Goal: Fluid Balance  Outcome: Ongoing (see interventions/notes)  Goal: Absence of Infection  Signs and Symptoms  Outcome: Ongoing (see interventions/notes)  Intervention: Prevent Infection Progression  Recent Flowsheet Documentation  Taken 09/22/2023 8469 by Rozelle Logan, LPN  Fever Reduction/Comfort Measures:   lightweight bedding   lightweight clothing  Goal: Effective Oxygenation and Ventilation  Outcome: Ongoing (see interventions/notes)  Intervention: Optimize Oxygenation and Ventilation  Recent Flowsheet Documentation  Taken 09/22/2023 1600 by Rozelle Logan, LPN  Head of Bed [facility]) Positioning: HOB elevated  Taken 09/22/2023 1200 by Rozelle Logan, LPN  Head of Bed [facility]) Positioning: HOB elevated  Taken 09/22/2023 0923 by Rozelle Logan, LPN  Head of Bed [facility]) Positioning: HOB elevated     Problem: Fatigue  Goal: Improved Activity Tolerance  Outcome: Ongoing (see interventions/notes)  Intervention: Promote Improved Energy  Recent Flowsheet Documentation  Taken 09/22/2023 1600 by Rozelle Logan, LPN  Activity Management: ROM, active encouraged  Taken 09/22/2023 1200 by Rozelle Logan, LPN  Activity Management: ROM, active encouraged  Taken 09/22/2023 0923 by Rozelle Logan, LPN  Sleep/Rest Enhancement: regular sleep/rest pattern promoted  Activity Management: ROM, active encouraged     Problem: Heart Failure Comorbidity  Goal: Maintenance of Heart Failure Symptom Control  Outcome: Ongoing (see interventions/notes)  Intervention: Maintain Heart Failure Management  Recent Flowsheet Documentation  Taken 09/22/2023 1200 by Rozelle Logan, LPN  Medication Review/Management: medications reviewed

## 2023-09-22 NOTE — Care Plan (Signed)
 Patient rested well this shift. Patient pills administered crushed in applesauce. Ptient remains disoriented to time only. Patient no c/o pain or distress. Patient can be incontinent at times, purewick remains in place. I have reviewed this patient's orders and plan of care.  Currently this patient meets requirements for a low to mid level of nursing care.     Lorre Munroe, RN

## 2023-09-22 NOTE — Nurses Notes (Signed)
 Patient is alert, disoriented to situation this shift. Free of falls. Denies any complaints of pain. Pt did receive Zofran for nausea. Pt did not have a BM this shift. External catheter in use for incontinence. Pt takes her medications crushed in applesauce. Vitals are stable. A new IV was placed by IV team. Remains clean, dry, and intact. Pt currently resting in bed with call light in reach. Bed alarms utilized.     BP (!) 103/45   Pulse 90   Temp 36.3 C (97.3 F)   Resp 14   Ht 1.575 m (5\' 2" )   Wt (!) 30.8 kg (67 lb 14.4 oz)   LMP  (LMP Unknown)   SpO2 93%   BMI 12.42 kg/m     I have reviewed this patients plan of care and will continue to monitor.     Lambert Keto, LPN

## 2023-09-22 NOTE — Progress Notes (Signed)
 Internal Medicine Progress Note    Names:  [name]  Date of service: 09/22/2023  Date of Admission:  09/18/2023  Hospital Day:  LOS: 3 days     Assessment/ Plan:   Active Hospital Problems    Diagnosis    Primary Problem: Pneumonia    Community acquired pneumonia of right lower lobe of lung       1. Community-acquired pneumonia.  Today is day 5.  Have Rocephin and doxycycline.  Continue.      2. Urinary tract infection.  On Rocephin.  Follow up on cultures and mics     3. 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  Blood pressure borderline today.  Blood pressure remains borderline low.  Holding medications.    4. Chronic constipation.  Add Dulcolax x1 today.  10 mg    5. Severe protein calorie malnutrition.  Continue to encourage nutritional intake.  Again counseled patient on nutrition    6. Senile dementia.  Mild confusion noted yesterday evening.  Improved today.      PT/OT: Yes    Disposition Planning:   Home with home health    Subjective:  No chest pain or palpitations.  Still some shortness a breath.  Still describes cough and congestion.  No nausea vomiting.  Passing flatus.  Patient not sure if she has had a bowel movement.  Will check with staff.  Reportedly voiding without difficulty    acetaminophen (TYLENOL) tablet, 650 mg, Oral, Q6H PRN  aspirin (ECOTRIN) enteric coated tablet 81 mg, 81 mg, Oral, Daily  atorvastatin (LIPITOR) tablet, 40 mg, Oral, QPM  carvedilol (COREG) tablet, 3.125 mg, Oral, Daily  cefTRIAXone (ROCEPHIN) injection 2 g, 2 g, Intravenous, Q24H  D5W 250 mL flush bag, , Intravenous, Q15 Min PRN  doxycycline hyclate (VIBRAMYCIN) capsule, 100 mg, Oral, 2x/day  enoxaparin PF (LOVENOX) 30 mg/0.3 mL SubQ injection, 30 mg, Subcutaneous, Q24H  furosemide (LASIX) tablet, 20 mg, Oral, Daily  guaiFENesin (MUCINEX) extended release tablet - for cough (expectorant), 600 mg, Oral, 2x/day  ipratropium-albuterol 0.5 mg-3 mg(2.5 mg base)/3 mL Solution for Nebulization, 3  mL, Nebulization, 4x/day  lactulose (ENULOSE) 10g per 15mL oral liquid, 30 mL, Oral, 2x/day  levothyroxine (SYNTHROID) tablet, 75 mcg, Oral, QAM  [Held by provider] lisinopril (PRINIVIL) tablet, 2.5 mg, Oral, Daily  metoclopramide (REGLAN) tablet, 5 mg, Oral, 3x/day AC  NS 250 mL flush bag, , Intravenous, Q15 Min PRN  NS flush syringe, 3 mL, Intracatheter, Q8HRS  NS flush syringe, 3 mL, Intracatheter, Q1H PRN  nystatin (MYCOSTATIN) 100,000 units per mL oral liquid, 5 mL, Swish & Swallow, 4x/day  ondansetron (ZOFRAN) 2 mg/mL injection, 4 mg, Intravenous, Q6H PRN  pantoprazole (PROTONIX) delayed release tablet, 40 mg, Oral, Daily before Breakfast  potassium chloride (KCLOR-CON) extended release tablet, 10 mEq, Oral, 2x/day-Food  sennosides-docusate sodium (SENOKOT-S) 8.6-50mg  per tablet, 1 Tablet, Oral, 2x/day  simethicone (MYLICON) chewable tablet, 160 mg, Oral, 4x/day        Physical Exam:    Vital Signs:  BP (!) 103/45   Pulse 90   Temp 36.3 C (97.3 F)   Resp 14   Ht 1.575 m (5\' 2" )   Wt (!) 30.8 kg (67 lb 14.4 oz)   LMP  (LMP Unknown)   SpO2 92%   BMI 12.42 kg/m         I/O:  I/O last 24 hours:    Intake/Output Summary (Last 24 hours) at 09/22/2023 1449  Last data filed at 09/22/2023 1300  Gross per 24 hour   Intake 410 ml   Output 600 ml   Net -190 ml     I/O current shift:  03/11 0700 - 03/11 1859  In: 360 [P.O.:360]  Out: 100 [Urine:100]  Blood Sugars:     General:  Alert.  Cooperative.  Cardio: Regular rate and rhythm. Normal S1 and S2. No murmurs, gallops, or rubs.   Resp:  Crackles on the right.  No bronchospasm noted today.  Abd: Bowel sounds present, Soft, non-tender.  No distention  Extremities: No edema or swelling noted.  No calf pain      Labs:     Results for orders placed or performed during the hospital encounter of 09/18/23 (from the past 24 hours)   CBC   Result Value Ref Range    WBC 8.0 3.7 - 11.0 x10^3/uL    RBC 3.31 (L) 3.85 - 5.22 x10^6/uL    HGB 10.3 (L) 11.5 - 16.0 g/dL    HCT 95.6  (L) 21.3 - 46.0 %    MCV 96.1 78.0 - 100.0 fL    MCH 31.1 26.0 - 32.0 pg    MCHC 32.4 31.0 - 35.5 g/dL    RDW-CV 08.6 57.8 - 46.9 %    PLATELETS 324 150 - 400 x10^3/uL    MPV 10.0 8.7 - 12.5 fL   COMPREHENSIVE METABOLIC PANEL, NON-FASTING   Result Value Ref Range    SODIUM 139 136 - 145 mmol/L    POTASSIUM 4.2 3.5 - 5.1 mmol/L    CHLORIDE 97 96 - 111 mmol/L    CO2 TOTAL 35 (H) 23 - 31 mmol/L    ANION GAP 7 4 - 13 mmol/L    BUN 26 (H) 8 - 25 mg/dL    CREATININE 6.29 5.28 - 1.05 mg/dL    BUN/CREA RATIO 36 (H) 6 - 22    ALBUMIN 2.2 (L) 3.4 - 4.8 g/dL     CALCIUM 8.8 8.6 - 41.3 mg/dL    GLUCOSE 86 65 - 244 mg/dL    ALKALINE PHOSPHATASE 70 55 - 145 U/L    ALT (SGPT) 9 8 - 22 U/L    AST (SGOT)  14 8 - 45 U/L    BILIRUBIN TOTAL 0.2 (L) 0.3 - 1.3 mg/dL    PROTEIN TOTAL 5.5 (L) 6.0 - 8.0 g/dL    ESTIMATED GFR - FEMALE 81 >=60 mL/min/BSA   MAGNESIUM   Result Value Ref Range    MAGNESIUM 2.1 1.8 - 2.6 mg/dL   PHOSPHORUS   Result Value Ref Range    PHOSPHORUS 2.1 (L) 2.3 - 4.0 mg/dL   IRON TRANSFERRIN AND TIBC   Result Value Ref Range    TOTAL IRON BINDING CAPACITY 169 (L) 224 - 476 ug/dL    IRON (TRANSFERRIN) SATURATION 27 20 - 50 %    IRON 46 45 - 170 ug/dL    TRANSFERRIN 010 (L) 160 - 340 mg/dL    Narrative    In hereditary hemochromatosis, serum iron is usually >150 ug/dL and % saturation is >27%. In advanced iron overload, % saturation is often >90%.   FERRITIN   Result Value Ref Range    FERRITIN 416 (H) 5 - 200 ng/mL   RETICULOCYTE COUNT   Result Value Ref Range    RETICULOCYTE % AUTOMATED 0.86 0.50 - 2.20 %    RETICULOCYTES COUNT # AUTOMATED 28.1 17.0 - 100.0 x10^3/uL    IMMATURE RETIC FRACTION 3.2 2.7 - 15.9 %  RETICULOCYTE HEMOGLOBIN EQUIVALENT 29.2 28.0 - 38.0 pg    Narrative    Both reticulocyte hemoglobin content(RET-He) and immature reticulocyte fraction (IRF) quantify circulating immature red blood cells (RBC). They are used to evaluate anemic patients who may have insufficient marrow production (lowerRET-He &  IRF) or rapid peripheral destruction of RBC (higher RET-He &IRF).     VITAMIN B12   Result Value Ref Range    VITAMIN B 12  253 200 - 900 pg/mL       Micro:   Hospital Encounter on 09/18/23 (from the past 96 hours)   URINE CULTURE,ROUTINE    Collection Time: 09/18/23 10:24 PM    Specimen: Urine, Site not specified   Culture Result Status    URINE CULTURE 100,000 Escherichia coli (A) Final       Susceptibility    Escherichia coli -  (no method available)     Ampicillin <=2 Sensitive      Ampicillin/Sulbactam <=2 Sensitive      Cefazolin <=4 Sensitive      Cefepime <=0.12 Sensitive      Ceftazidime <=1 Sensitive      Ceftriaxone <=0.25 Sensitive      Ciprofloxacin <=0.25 Sensitive      Ertapenem <=0.12 Sensitive      ESBL Negative       Gentamicin <=1 Sensitive      Imipenem <=0.25 Sensitive      Levofloxacin <=0.12 Sensitive      Nitrofurantoin <=16 Sensitive      Piperacillin/Tazobactam <=4 Sensitive      Tobramycin <=1 Sensitive      Trimethoprim/Sulfamethoxazole <=20 Sensitive        Radiology:       Marylee Floras, MD    09/22/2023   10:38  [facility]  [facility]

## 2023-09-22 NOTE — Progress Notes (Addendum)
 [facility]  IP PROGRESS NOTE      [name], [name]  Date of Admission:  09/18/2023  Date of Birth:  04-10-1935  Date of Service:  09/22/2023    Assessment/ Plan:   Community-acquired pneumonia-day 5 of broad antibiotics.  Clinically, continues to show signs of improvement.  Continue to monitor closely    Urinary tract infection-day 5 of broad antibiotics.  Remains asymptomatic.  Will continue to follow closely  Oral yeast infection-day 2 of nystatin.  We will continue this for a full course at home  Anemia-no overt bleeding noted.  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  Stable, continue as is  Chronic constipation-stable, continue as is   Mixed hyperlipidemia-stable, continue as is   Primary hypertension-stable, continue as is   Acquired hypothyroidism-stable, continue as is   Gastroesophageal reflux disease-stable, continue as is     Nutrition: Malnutrition Severity: Severe protein calorie malnutrition  DIET CARDIAC (2G NA, LOW FAT, LOW CHOL, LOW CAFFEINE) Consistency/thickening: Solid: PUREE  MNT PROTOCOL FOR DIETITIAN  DIETARY ORAL SUPPLEMENTS Product Name: Mighty Shake-Chocolate; BREAKFAST &2PM; 1 Each  -monitor for weight change  -monitor intake and output  -monitor bowel functions  -Patient meets criteria based on the following clinical characteristics:  Malnutrition (Undernutrition) Diagnosis  Malnutrition Chronicity: Chronic illness  Malnutrition Severity: Severe protein calorie malnutrition  Related to: Inadequate protein and enregy intake  Energy Intake: Less than 75% of EER in greater than or equal to 3 months  Weight Loss:  (4# 5.6% x 6 weeks and 10# x 2 mo 14%.)  Body Fat Loss: Severe (Subcutaneous fat loss (3+): orbital 3+, cheek 3+, tricep 3+)  Muscle Mass Loss: Severe (Muscle mass loss (3+): temple 3+, clavicle 3+, shoulder 3+, scapula n/a, hand 3+, thigh 2+, calf 3+)  Other Factors: Pneumonia, CHF  (BMI 12.4)  Nutrition Treatment Plan: Initiate oral supplement, Liberalize diet to increase po intake, Monitor weight trends (consider appetite stimulant if oral intake is inadequate)     Muscular deconditioning-consult Physical therapy    Peptic ulcer disease, deep vein thrombosis and atelectasis prophylaxis   Discharge plans pending -possibly 09/23/2023    Problem List:  Active Hospital Problems   (*Primary Problem)    Diagnosis    *Pneumonia    Community acquired pneumonia of right lower lobe of lung     DVT/PE Prophylaxis: Enoxaparin  Disposition Planning: Home discharge     Advance Care Planning Discussed:  No    Subjective   Patient feels "stronger".  Dry cough noted this morning.  Breathing is "better".  Denies pain, palpitations, shortness for breath or dizziness.  Tolerating diet with normal bowel movement.  No other complaints.  Compliant with medical regimen without complication.      Cardiovascular-the patient denies chest pain, palpitations, PND, orthopnea, or peripheral edema.  Pulmonary-patient denies shortness of breath, dyspnea on exertion or hemoptysis.  See HPI  Neurologic-patient denies hemiparesis, paresthesias, dysesthesias, dysarthrias, balance problems, syncopal episodes, seizure disorder or headache.  GI-patient denies nausea, vomiting, hematemesis, melena, hematochezia, diarrhea, constipation or other abdominal complaints.  GU-patient denies dysuria, hematuria, urinary frequency, kidney stone, kidney infection, flank pain, fever, chills, diaphoresis or night sweats.      Current Medications:  acetaminophen (TYLENOL) tablet, 650 mg, Oral, Q6H PRN  aspirin (ECOTRIN) enteric coated tablet 81 mg, 81 mg, Oral, Daily  atorvastatin (LIPITOR) tablet, 40 mg, Oral, QPM  carvedilol (COREG) tablet, 3.125 mg, Oral, Daily  cefTRIAXone (ROCEPHIN) injection 2 g, 2 g, Intravenous, Q24H  D5W 250 mL flush bag, , Intravenous, Q15 Min PRN  doxycycline hyclate (VIBRAMYCIN) capsule, 100 mg, Oral,  2x/day  enoxaparin PF (LOVENOX) 30 mg/0.3 mL SubQ injection, 30 mg, Subcutaneous, Q24H  [Held by provider] furosemide (LASIX) tablet, 40 mg, Oral, 2x/day  guaiFENesin (MUCINEX) extended release tablet - for cough (expectorant), 600 mg, Oral, 2x/day  ipratropium-albuterol 0.5 mg-3 mg(2.5 mg base)/3 mL Solution for Nebulization, 3 mL, Nebulization, 4x/day  lactulose (ENULOSE) 10g per 15mL oral liquid, 30 mL, Oral, 2x/day  levothyroxine (SYNTHROID) tablet, 75 mcg, Oral, QAM  [Held by provider] lisinopril (PRINIVIL) tablet, 2.5 mg, Oral, Daily  metoclopramide (REGLAN) tablet, 5 mg, Oral, 3x/day AC  NS 250 mL flush bag, , Intravenous, Q15 Min PRN  NS flush syringe, 3 mL, Intracatheter, Q8HRS  NS flush syringe, 3 mL, Intracatheter, Q1H PRN  nystatin (MYCOSTATIN) 100,000 units per mL oral liquid, 5 mL, Swish & Swallow, 4x/day  ondansetron (ZOFRAN) 2 mg/mL injection, 4 mg, Intravenous, Q6H PRN  pantoprazole (PROTONIX) delayed release tablet, 40 mg, Oral, Daily before Breakfast  [Held by provider] potassium chloride (KCLOR-CON) extended release tablet, 10 mEq, Oral, 2x/day-Food  sennosides-docusate sodium (SENOKOT-S) 8.6-50mg  per tablet, 1 Tablet, Oral, 2x/day  simethicone (MYLICON) chewable tablet, 160 mg, Oral, 4x/day          Objective     Vital Signs:  Temp (24hrs) Max:36.8 C (98.2 F)      Temperature: 36.8 C (98.2 F)  BP (Non-Invasive): (!) 117/55  MAP (Non-Invasive): 72 mmHG  Heart Rate: 68  Respiratory Rate: 18  SpO2: 100 %    Today's Physical Exam:  Constitutional: no distress  Eyes: Conjunctiva clear.  ENT: ENMT without erythema or injection, mucous membranes moist.  Dark, fuzzy patch noted over the tongue  Neck: supple, symmetrical, trachea midline and no adenopathy  Respiratory:  Clear with equal breath sounds and unlabored breathing at rest.  Cardiovascular: regular rate and rhythm, S1, S2 normal, no murmur, click, rub or gallop  No JVD  Homans sign is negative, no sign of DVT  pedal edema 0+  bilateral  Gastrointestinal: Soft, non-tender, Bowel sounds normal, non-distended, No hepatosplenomegaly, No masses, Abdominal bruit absent  Genitourinary: Deferred  Musculoskeletal: Head atraumatic and normocephalic and Other joints are grossly normal  Integumentary:  Skin warm and dry  Neurologic: Grossly normal  Lymphatic/Immunologic/Hematologic: No lymphadenopathy  Psychiatric: Normal      I/O:  I/O last 24 hours:    Intake/Output Summary (Last 24 hours) at 09/22/2023 0717  Last data filed at 09/22/2023 0600  Gross per 24 hour   Intake 660 ml   Output 500 ml   Net 160 ml     I/O current shift:  No intake/output data recorded.    Nutrition/Residuals:  DIET CARDIAC (2G NA, LOW FAT, LOW CHOL, LOW CAFFEINE) Consistency/thickening: Solid: PUREE  MNT PROTOCOL FOR DIETITIAN  DIETARY ORAL SUPPLEMENTS Product Name: Mighty Shake-Chocolate; BREAKFAST &2PM; 1 Each    Labs  Please indicate ordered or reviewed)  Reviewed: Lab Results Today:    Results for orders placed or performed during the hospital encounter of 09/18/23 (from the past 24 hours)   CBC   Result Value Ref Range    WBC 8.0 3.7 - 11.0 x10^3/uL    RBC 3.31 (L) 3.85 - 5.22 x10^6/uL    HGB 10.3 (L) 11.5 - 16.0 g/dL    HCT 16.1 (L) 09.6 - 46.0 %    MCV 96.1 78.0 - 100.0  fL    MCH 31.1 26.0 - 32.0 pg    MCHC 32.4 31.0 - 35.5 g/dL    RDW-CV 16.1 09.6 - 04.5 %    PLATELETS 324 150 - 400 x10^3/uL    MPV 10.0 8.7 - 12.5 fL   COMPREHENSIVE METABOLIC PANEL, NON-FASTING   Result Value Ref Range    SODIUM 139 136 - 145 mmol/L    POTASSIUM 4.2 3.5 - 5.1 mmol/L    CHLORIDE 97 96 - 111 mmol/L    CO2 TOTAL 35 (H) 23 - 31 mmol/L    ANION GAP 7 4 - 13 mmol/L    BUN 26 (H) 8 - 25 mg/dL    CREATININE 4.09 8.11 - 1.05 mg/dL    BUN/CREA RATIO 36 (H) 6 - 22    ALBUMIN 2.2 (L) 3.4 - 4.8 g/dL     CALCIUM 8.8 8.6 - 91.4 mg/dL    GLUCOSE 86 65 - 782 mg/dL    ALKALINE PHOSPHATASE 70 55 - 145 U/L    ALT (SGPT) 9 8 - 22 U/L    AST (SGOT)  14 8 - 45 U/L    BILIRUBIN TOTAL 0.2 (L) 0.3 - 1.3  mg/dL    PROTEIN TOTAL 5.5 (L) 6.0 - 8.0 g/dL    ESTIMATED GFR - FEMALE 81 >=60 mL/min/BSA   MAGNESIUM   Result Value Ref Range    MAGNESIUM 2.1 1.8 - 2.6 mg/dL   PHOSPHORUS   Result Value Ref Range    PHOSPHORUS 2.1 (L) 2.3 - 4.0 mg/dL     Ordered:      Diagnostic Tests (Please indicate ordered or reviewed)  Reviewed:   Ordered:     Radiology Tests (Please indicate ordered or reviewed)  Reviewed:   Results for orders placed or performed during the hospital encounter of 09/18/23   XR AP MOBILE CHEST     Status: None    Narrative    Summers L Birnie    PROCEDURE DESCRIPTION: XR AP MOBILE CHEST    CLINICAL INDICATION: dyspnea tachypnea    TECHNIQUE: 1 views / 1 images submitted.    COMPARISON: 08/09/2023      FINDINGS: Unchanged heart size. Small left pleural effusion, similar to prior examination. No right pleural effusion. No airspace opacity or pulmonary edema.       Impression    Small left pleural effusion, similar to prior. No acute abnormality.                Radiologist location ID: [id number]     CTA CHEST FOR PULMONARY EMBOLUS AND CT ABD/PEL W IV CONTRAST     Status: None    Narrative    Ladean L Santarelli    PROCEDURE DESCRIPTION: CTA CHEST FOR PULMONARY EMBOLUS AND CT ABD/PEL W IV CONTRAST    CTA chest was performed to evaluate for pulmonary embolism with 3D/MIP reconstructions generated.    CLINICAL INDICATION: hypoxic; upper abd pain with nausea/decreased PO intake, leukocytosis, eval for pneumonia, edema, PE or intraabd source of infection    COMPARISON: 08/07/2023      FINDINGS: Study is of good diagnostic quality.    No filling defects are identified within the pulmonary arteries. The main pulmonary artery is normal in size. The heart size is normal. There is no pericardial effusion. 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    Patchy opacity is present within the right lower lobe. Small left pleural effusion is present with associated left lower lobe atelectasis.  Lungs are otherwise  clear.    The liver is normal in appearance. No biliary ductal dilation is identified. The spleen, pancreas and adrenal glands are normal in appearance. Kidneys are normal in appearance with no hydronephrosis or stone.     The stomach, small bowel and colon are normal in appearance. Extensive stool burden is noted within the colon. There is no free air or fluid. The bladder is normal in appearance. There is no abdominopelvic adenopathy.    There is no acute osseous abnormality. Significant skeletal deformity and scoliosis are again noted with associated multilevel chronic compression fractures.      Impression    1. No pulmonary embolism.  2. Patchy right lower lobe airspace disease.  3. Small left pleural effusion with associated left lower lobe atelectasis.  4. No acute intra-abdominal abnormality identified.            The CT exam was performed using one or more of the following dose reduction techniques: Automated exposure control, adjustment of the mA and/or kV according to the patient's size, or use of iterative reconstruction technique.        Radiologist location ID: [id number]       More than 35 minutes were spent reviewing the patient's records, acquiring history, performing physical exam, reviewing labs and x-ray reports, reviewing procedures as applicable, ordering medications as applicable, educating the patient in plain English, coordinating care with staff and other providers as applicable, independent interpretation of lab results as applicable, medical decision making, post-visit activities and documentation of the above.  Old medical records were reviewed as available and no other relevant information was obtained.    This report was generated using voice recognition software, and is inherently subject to errors including those of syntax and "sound-alike " substitutions which may escape proof reading. In such instances, original meaning may be extrapolated by contextual derivation.    Ordered:       Chevis Pretty, MD

## 2023-09-22 NOTE — Nurses Notes (Signed)
Currently this patient meets requirements for a low to mid level of nursing care. The patient will continue to be monitored and re-evaluated for any changes.    Sayra Frisby, RN

## 2023-09-22 NOTE — Home Health Plan of Care Certification Statement (Signed)
 I certify that this patient's homebound status is limited ambulation due to weakness; fatigues quickly with minimal exertion and he/she needs intermittent skilled nursing and physical therapy services. The patient's current home health diagnosis is acute on chronic systolic (congestive) heart failure. The patient is under my care and I have authorized services on this plan of care and will periodically review the plan. The patient had a face-to-face encounter with an allowed provider type on 08/14/23 and the encounter was related to the primary reason for home health care. The patient is estimated to have services for 6 weeks.

## 2023-09-22 NOTE — Care Management Notes (Signed)
 [facility]  Care Management Initial Evaluation    Patient Name: [name]  Date of Birth: [birth year]  Sex: female  Date/Time of Admission: 09/18/2023  8:51 PM  Room/Bed: 4130/A  Payor: MEDICARE / Plan: MEDICARE PART A AND B / Product Type: Medicare /   PCP: Marylee Floras, MD    Pharmacy Info:   Preferred Pharmacy       [facility] [address], [place] - [address]    [address] [facility] [place] [address]    Phone: [phone] Fax: [phone]    Hours: Not open 24 hours    [facility]    [address] [place] [address]    Phone: [phone] Fax: [phone]    Hours: Monday-Friday 7AM-7PM, Saturday 10AM-3PM, Closed Sunday          Emergency Contact Info:   Extended Emergency Contact Information  Primary Emergency Contact: vincent,donna   [place]  Home Phone: [phone]  Mobile Phone: [phone]  Relation: None    History:   [name] is a 89 y.o., female, admitted through ED 09/19/23    Height/Weight: 157.5 cm (5\' 2" ) / (!) 30.8 kg (67 lb 14.4 oz)     LOS: 3 days   Admitting Diagnosis: Pneumonia [J18.9]    Assessment: Patient lives with adult daughter during week and with adult son on the week end. Patient has low vision. She has/uses rollator, shower chair, commode rise, [employer]/self pay (as she did not qualify for home 02). Bed rail. Patient is established with [facility]. Order pended to resume services, and referral sent via careport. CM confirmed address, PCP, and [place], as on chart. Preferred pharmacy is OP pharmacy at [facility]. Daughter will provide transportation home at time of DC. Cantrell additional needs identified at time of this evaluation.     Discharge Plan:  Undetermined at this time    The patient will continue to be evaluated for developing discharge needs.     Case Manager: Margit Hanks, CASE MANAGER  Phone: [phone]

## 2023-09-23 ENCOUNTER — Other Ambulatory Visit

## 2023-09-23 LAB — CBC
HCT: 36.8 % (ref 34.8–46.0)
HGB: 12 g/dL (ref 11.5–16.0)
MCH: 31.9 pg (ref 26.0–32.0)
MCHC: 32.6 g/dL (ref 31.0–35.5)
MCV: 97.9 fL (ref 78.0–100.0)
MPV: 9.3 fL (ref 8.7–12.5)
PLATELETS: 349 10*3/uL (ref 150–400)
RBC: 3.76 10*6/uL — ABNORMAL LOW (ref 3.85–5.22)
RDW-CV: 13.5 % (ref 11.5–15.5)
WBC: 6.2 10*3/uL (ref 3.7–11.0)

## 2023-09-23 LAB — BASIC METABOLIC PANEL
ANION GAP: 7 mmol/L (ref 4–13)
BUN/CREA RATIO: 22 (ref 6–22)
BUN: 22 mg/dL (ref 8–25)
CALCIUM: 9.4 mg/dL (ref 8.6–10.3)
CHLORIDE: 94 mmol/L — ABNORMAL LOW (ref 96–111)
CO2 TOTAL: 38 mmol/L — ABNORMAL HIGH (ref 23–31)
CREATININE: 0.98 mg/dL (ref 0.60–1.05)
ESTIMATED GFR - FEMALE: 56 mL/min/BSA — ABNORMAL LOW (ref >=60)
GLUCOSE: 87 mg/dL (ref 65–125)
POTASSIUM: 4.9 mmol/L (ref 3.5–5.1)
SODIUM: 139 mmol/L (ref 136–145)

## 2023-09-23 LAB — PHOSPHORUS: PHOSPHORUS: 2.8 mg/dL (ref 2.3–4.0)

## 2023-09-23 LAB — MAGNESIUM: MAGNESIUM: 2 mg/dL (ref 1.8–2.6)

## 2023-09-23 NOTE — Nurses Notes (Signed)
 Patient is alert. Free from falls. IV patent. Bed alarm utilized. No other complaints or concerns voiced at this time .Lonia Chimera, RN

## 2023-09-23 NOTE — Care Management Notes (Addendum)
 Spoke to [place], [place]. Provided [facility] CarePort listing with CMS ratings for Skilled Nursing Facility  in 50 mile radius of [address] zip code. The list provided is available in CarePort. Lupita Leash chose to send referrals to facilities in [facility]. SW will complete PAS and submit referral. Shelbie Hutching, SOCIAL WORKER  09/23/2023 14:58    [facility] offering bed to patient. SW attempted to contact [facility] multiple times in regards to bed offer however no answer at either phone numbers listed in chart. SW unable to leave a voicemail at this time. SW accepted bed at [facility] as [facility] wanted patient as close as possible and this is the closest facility to the home. SW will update MD. Shelbie Hutching, SOCIAL WORKER  09/23/2023 16:26

## 2023-09-23 NOTE — Home Health (Signed)
 Patient admitted to [facility] as of 09/19/23 with community acquired pneumonia

## 2023-09-23 NOTE — Progress Notes (Signed)
 Internal Medicine Progress Note    Names:  [name]  Date of service: 09/23/2023  Date of Admission:  09/18/2023  Hospital Day:  LOS: 4 days     Assessment/ Plan:   Active Hospital Problems    Diagnosis    Primary Problem: Pneumonia    Community acquired pneumonia of right lower lobe of lung       1. Community-acquired pneumonia.  Today is day 6.  Have Rocephin and doxycycline.  Continue.  Will discontinue antibiotics after day 7.    2. Urinary tract infection.  On Rocephin.  100000 colonies E coli.  Will have completed 7 days of Rocephin tomorrow.  Will discontinue tomorrow    3. 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  Blood pressure borderline today.  Blood pressure remains borderline low.  Holding medications.    4. Chronic constipation.  Did have small bowel movement  5. Severe protein calorie malnutrition.  Continue to encourage nutritional intake.  Again counseled patient on nutrition    6. Senile dementia.  Mild confusion noted yesterday evening.  Improved today.      PT/OT: Yes    Disposition Planning:   Home with home health    Subjective:  No chest pain or palpitations.  Still some shortness a breath.  Still describes cough and congestion.  No nausea vomiting.  Passing flatus.  Voiding.    acetaminophen (TYLENOL) tablet, 650 mg, Oral, Q6H PRN  aspirin (ECOTRIN) enteric coated tablet 81 mg, 81 mg, Oral, Daily  atorvastatin (LIPITOR) tablet, 40 mg, Oral, QPM  carvedilol (COREG) tablet, 3.125 mg, Oral, Daily  cefTRIAXone (ROCEPHIN) injection 2 g, 2 g, Intravenous, Q24H  D5W 250 mL flush bag, , Intravenous, Q15 Min PRN  doxycycline hyclate (VIBRAMYCIN) capsule, 100 mg, Oral, 2x/day  enoxaparin PF (LOVENOX) 30 mg/0.3 mL SubQ injection, 30 mg, Subcutaneous, Q24H  furosemide (LASIX) tablet, 20 mg, Oral, Daily  guaiFENesin (MUCINEX) extended release tablet - for cough (expectorant), 600 mg, Oral, 2x/day  ipratropium-albuterol 0.5 mg-3 mg(2.5 mg base)/3 mL Solution for  Nebulization, 3 mL, Nebulization, 4x/day  lactulose (ENULOSE) 10g per 15mL oral liquid, 30 mL, Oral, 2x/day  levothyroxine (SYNTHROID) tablet, 75 mcg, Oral, QAM  [Held by provider] lisinopril (PRINIVIL) tablet, 2.5 mg, Oral, Daily  metoclopramide (REGLAN) tablet, 5 mg, Oral, 3x/day AC  NS 250 mL flush bag, , Intravenous, Q15 Min PRN  NS flush syringe, 3 mL, Intracatheter, Q8HRS  NS flush syringe, 3 mL, Intracatheter, Q1H PRN  nystatin (MYCOSTATIN) 100,000 units per mL oral liquid, 5 mL, Swish & Swallow, 4x/day  ondansetron (ZOFRAN) 2 mg/mL injection, 4 mg, Intravenous, Q6H PRN  pantoprazole (PROTONIX) delayed release tablet, 40 mg, Oral, Daily before Breakfast  potassium chloride (KCLOR-CON) extended release tablet, 10 mEq, Oral, 2x/day-Food  sennosides-docusate sodium (SENOKOT-S) 8.6-50mg  per tablet, 1 Tablet, Oral, 2x/day  simethicone (MYLICON) chewable tablet, 160 mg, Oral, 4x/day        Physical Exam:    Vital Signs:  BP (!) 113/53   Pulse 92   Temp 36.5 C (97.7 F)   Resp 18   Ht 1.575 m (5\' 2" )   Wt (!) 30.8 kg (67 lb 14.4 oz)   LMP  (LMP Unknown)   SpO2 98%   BMI 12.42 kg/m         I/O:  I/O last 24 hours:    Intake/Output Summary (Last 24 hours) at 09/23/2023 1351  Last data filed at 09/23/2023 1300  Gross per 24 hour   Intake  1100 ml   Output 400 ml   Net 700 ml     I/O current shift:  03/12 0700 - 03/12 1859  In: 360 [P.O.:360]  Out: -   Blood Sugars:     General:  Some confusion  Cardio: Regular rate and rhythm. Normal S1 and S2. No murmurs, gallops, or rubs.   Resp:  Crackles on the right.  No bronchospasm noted today.  Abd: Bowel sounds present, Soft, non-tender.  No distention  Extremities: No edema or swelling noted.  No calf pain      Labs:     Results for orders placed or performed during the hospital encounter of 09/18/23 (from the past 24 hours)   CBC   Result Value Ref Range    WBC 6.2 3.7 - 11.0 x10^3/uL    RBC 3.76 (L) 3.85 - 5.22 x10^6/uL    HGB 12.0 11.5 - 16.0 g/dL    HCT 16.1 09.6 -  04.5 %    MCV 97.9 78.0 - 100.0 fL    MCH 31.9 26.0 - 32.0 pg    MCHC 32.6 31.0 - 35.5 g/dL    RDW-CV 40.9 81.1 - 91.4 %    PLATELETS 349 150 - 400 x10^3/uL    MPV 9.3 8.7 - 12.5 fL   BASIC METABOLIC PANEL   Result Value Ref Range    SODIUM 139 136 - 145 mmol/L    POTASSIUM 4.9 3.5 - 5.1 mmol/L    CHLORIDE 94 (L) 96 - 111 mmol/L    CO2 TOTAL 38 (H) 23 - 31 mmol/L    ANION GAP 7 4 - 13 mmol/L    CALCIUM 9.4 8.6 - 10.3 mg/dL    GLUCOSE 87 65 - 782 mg/dL    BUN 22 8 - 25 mg/dL    CREATININE 9.56 2.13 - 1.05 mg/dL    BUN/CREA RATIO 22 6 - 22    ESTIMATED GFR - FEMALE 56 (L) >=60 mL/min/BSA   MAGNESIUM   Result Value Ref Range    MAGNESIUM 2.0 1.8 - 2.6 mg/dL   PHOSPHORUS   Result Value Ref Range    PHOSPHORUS 2.8 2.3 - 4.0 mg/dL       Micro:   No results found for any visits on 09/18/23 (from the past 96 hours).      Radiology:       Marylee Floras, MD    09/23/2023   10:38  [facility]  [facility]

## 2023-09-23 NOTE — Care Plan (Signed)
 Problem: Wound  Goal: Optimal Coping  Outcome: Ongoing (see interventions/notes)  Goal: Optimal Functional Ability  Outcome: Ongoing (see interventions/notes)  Intervention: Optimize Functional Ability  Recent Flowsheet Documentation  Taken 09/23/2023 0834 by Alfonso Ellis, RN  Activity Management: ROM, active encouraged  Activity Assistance Provided: assistance, 1 person  Goal: Absence of Infection Signs and Symptoms  Outcome: Ongoing (see interventions/notes)  Intervention: Prevent or Manage Infection  Recent Flowsheet Documentation  Taken 09/23/2023 0834 by Alfonso Ellis, RN  Fever Reduction/Comfort Measures:   lightweight clothing   lightweight bedding  Goal: Improved Oral Intake  Outcome: Ongoing (see interventions/notes)  Goal: Optimal Pain Control and Function  Outcome: Ongoing (see interventions/notes)  Intervention: Prevent or Manage Pain  Recent Flowsheet Documentation  Taken 09/23/2023 0834 by Alfonso Ellis, RN  Sleep/Rest Enhancement:   awakenings minimized   consistent schedule promoted  Goal: Skin Health and Integrity  Outcome: Ongoing (see interventions/notes)  Intervention: Optimize Skin Protection  Recent Flowsheet Documentation  Taken 09/23/2023 0834 by Alfonso Ellis, RN  Pressure Reduction Techniques: Moisture, shear and nutrition are maximized  Pressure Reduction Devices: Repositioning wedges/pillows utilized  Activity Management: ROM, active encouraged  Head of Bed (HOB) Positioning: HOB elevated  Goal: Optimal Wound Healing  Outcome: Ongoing (see interventions/notes)  Intervention: Promote Wound Healing  Recent Flowsheet Documentation  Taken 09/23/2023 0834 by Alfonso Ellis, RN  Pressure Reduction Techniques: Moisture, shear and nutrition are maximized  Pressure Reduction Devices: Repositioning wedges/pillows utilized  Sleep/Rest Enhancement:   awakenings minimized   consistent schedule promoted  Activity Management: ROM, active encouraged     Problem: Health Knowledge, Opportunity to Enhance  (Adult,Obstetrics,Pediatric)  Goal: Knowledgeable about Health Subject/Topic  Description: Patient will demonstrate the desired outcomes by discharge/transition of care.  Outcome: Ongoing (see interventions/notes)     Problem: Fall Injury Risk  Goal: Absence of Fall and Fall-Related Injury  Outcome: Ongoing (see interventions/notes)  Intervention: Identify and Manage Contributors  Recent Flowsheet Documentation  Taken 09/23/2023 0834 by Alfonso Ellis, RN  Self-Care Promotion:   independence encouraged   BADL personal objects within reach  Intervention: Promote Injury-Free Environment  Recent Flowsheet Documentation  Taken 09/23/2023 0834 by Alfonso Ellis, RN  Safety Promotion/Fall Prevention:   activity supervised   safety round/check completed   nonskid shoes/slippers when out of bed     Problem: Skin Injury Risk Increased  Goal: Skin Health and Integrity  Outcome: Ongoing (see interventions/notes)  Intervention: Optimize Skin Protection  Recent Flowsheet Documentation  Taken 09/23/2023 0834 by Alfonso Ellis, RN  Pressure Reduction Techniques: Moisture, shear and nutrition are maximized  Pressure Reduction Devices: Repositioning wedges/pillows utilized  Skin Protection:   adhesive use limited   tubing/devices free from skin contact  Activity Management: ROM, active encouraged  Head of Bed (HOB) Positioning: HOB elevated     Problem: Adult Inpatient Plan of Care  Goal: Plan of Care Review  Outcome: Ongoing (see interventions/notes)  Goal: Patient-Specific Goal (Individualized)  Outcome: Ongoing (see interventions/notes)  Goal: Absence of Hospital-Acquired Illness or Injury  Outcome: Ongoing (see interventions/notes)  Intervention: Identify and Manage Fall Risk  Recent Flowsheet Documentation  Taken 09/23/2023 0834 by Alfonso Ellis, RN  Safety Promotion/Fall Prevention:   activity supervised   safety round/check completed   nonskid shoes/slippers when out of bed  Intervention: Prevent Skin Injury  Recent Flowsheet Documentation  Taken  09/23/2023 0834 by Alfonso Ellis, RN  Body Position: supine, head elevated  Skin Protection:   adhesive use limited   tubing/devices free from skin contact  Intervention: Prevent and Manage VTE (Venous Thromboembolism) Risk  Recent Flowsheet Documentation  Taken 09/23/2023 0834 by Alfonso Ellis, RN  VTE Prevention/Management:   ambulation promoted   dorsiflexion/plantar flexion performed  Goal: Optimal Comfort and Wellbeing  Outcome: Ongoing (see interventions/notes)  Goal: Rounds/Family Conference  Outcome: Ongoing (see interventions/notes)     Problem: Gas Exchange Impaired  Goal: Optimal Gas Exchange  Outcome: Ongoing (see interventions/notes)  Intervention: Optimize Oxygenation and Ventilation  Recent Flowsheet Documentation  Taken 09/23/2023 0834 by Alfonso Ellis, RN  Head of Bed [facility]) Positioning: HOB elevated     Problem: Pneumonia  Goal: Fluid Balance  Outcome: Ongoing (see interventions/notes)  Goal: Absence of Infection Signs and Symptoms  Outcome: Ongoing (see interventions/notes)  Intervention: Prevent Infection Progression  Recent Flowsheet Documentation  Taken 09/23/2023 0834 by Alfonso Ellis, RN  Fever Reduction/Comfort Measures:   lightweight clothing   lightweight bedding  Goal: Effective Oxygenation and Ventilation  Outcome: Ongoing (see interventions/notes)  Intervention: Optimize Oxygenation and Ventilation  Recent Flowsheet Documentation  Taken 09/23/2023 0834 by Alfonso Ellis, RN  Head of Bed [facility]) Positioning: HOB elevated     Problem: Fatigue  Goal: Improved Activity Tolerance  Outcome: Ongoing (see interventions/notes)  Intervention: Promote Improved Energy  Recent Flowsheet Documentation  Taken 09/23/2023 0834 by Alfonso Ellis, RN  Sleep/Rest Enhancement:   awakenings minimized   consistent schedule promoted  Activity Management: ROM, active encouraged     Problem: Heart Failure Comorbidity  Goal: Maintenance of Heart Failure Symptom Control  Outcome: Ongoing (see interventions/notes)

## 2023-09-23 NOTE — Care Management Notes (Signed)
 Secure chat sent to MD inquiring about capacity. Dr Jules Schick stated patient lacked capacity. SW updated banner to say such, marked [name] as MPOA and active MDM. SW attempted to contact [name] in regards to SNF recommendation from PT, however there was no answer. Voicemail left requesting call back. Shelbie Hutching, SOCIAL WORKER  09/23/2023 09:19

## 2023-09-23 NOTE — Care Plan (Signed)
 [facility]  Rehabilitation Services  Physical Therapy Progress Note    Patient Name: [name]  Date of Birth: 11-06-1934  Height:  157.5 cm (5\' 2" )  Weight:  (!) 30.8 kg (67 lb 14.4 oz)  Room/Bed: 4130/A  Payor: MEDICARE / Plan: MEDICARE PART A AND B / Product Type: Medicare /     Assessment:     (P) Pt ambulated for 40' x1, 30' x 1 using FWW with CGA and verbal cues for safety during turns and to inc step height and stride length. Amb distance limited by fatigue and weakness. Continue to recommend SNF once medically stable for D/C.    Discharge Needs:   Equipment Recommendation: front wheeled walker    The patient presents with mobility limitations due to impaired balance, impaired strength, and impaired functional activity tolerance that significantly impair/prevent patient's ability to participate in mobility-related activities of daily living (MRADLs) including  ambulation and transfers in order to safely complete, toileting, safely entering/exiting the home. This functional mobility deficit can be sufficiently resolved with the use of a front wheeled walker in order to decrease the risk of falls, morbidity, and mortality in performance of these MRADLs.  Patient is able to safely use this assistive device.    Discharge Disposition: skilled nursing facility    JUSTIFICATION OF DISCHARGE RECOMMENDATION   Based on current diagnosis, functional performance prior to admission, and current functional performance, this patient requires continued PT services in skilled nursing facility in order to achieve significant functional improvements in these deficit areas: aerobic capacity/endurance, gait, locomotion, and balance.      Plan:   Continue to follow patient according to established plan of care.  The risks/benefits of therapy have been discussed with the patient/caregiver and he/she is in agreement with the established plan of care.     Subjective & Objective:        09/23/23 1055   Therapist  Pager   PT Assigned/ Pager # Marjean Donna [phone]   Rehab Session   Document Type therapy progress note (daily note)   PT Visit Date 09/23/23   Total PT Minutes: 25  (10:55-11:20)   Patient Effort good   Symptoms Noted During/After Treatment fatigue   General Information   Patient Profile Reviewed yes   Onset of Illness/Injury or Date of Surgery 09/18/23   Patient/Family/Caregiver Comments/Observations pt agreeable to PT with encouragement   Pertinent History of Current Functional Problem Pt is admitted due to pneumonia.   Medical Lines PIV Line   Respiratory Status nasal cannula   Existing Precautions/Restrictions fall precautions   Pre Treatment Status   Pre Treatment Patient Status Patient supine in bed;Call light within reach;Telephone within reach;Patient safety alarm activated   Support Present Pre Treatment  None   Cognition   Behavior/Mood Observations behavior appropriate to situation, WNL/WFL   Orientation Status oriented x 4   Attention WNL/WFL   Follows Commands follows one step commands  (hard of hearing on R>L)   Vital Signs   Post SpO2 (%) 95   O2 Delivery Post Treatment supplemental O2   Pain Assessment   Pretreatment Pain Rating 0/10 - no pain   Posttreatment Pain Rating 0/10 - no pain   Bed Mobility   Supine-Sit Independence minimum assist (75% patient effort)   Bed Mobility, Assistive Device Head of Bed Elevated;bed rails   Comment min A on trunk and to scoot to EOB   Safety Issues impaired trunk control for bed mobility;decreased use of legs for  bridging/pushing;decreased use of arms for pushing/pulling   Impairments strength decreased;balance impaired;endurance   Transfer Assessment/Treatment   Sit-Stand Independence contact guard assist   Stand-Sit Independence contact guard assist   Sit-Stand-Sit, Assist Device walker, front wheeled   Transfer Impairments strength decreased;balance impaired;endurance;postural control impaired   Transfer Comment verbal cues on proper hand placement and posture; pt needs  mod-max A with donning new brief   Gait Assessment/Treatment   Independence  contact guard assist   Assistive Device  walker, front wheeled   Distance in Feet 40' x 1, 30' x 1   Deviations  cadence decreased;step length decreased;stride length decreased   Safety Issues  balance decreased during turns;step length decreased   Impairments  postural control impaired;strength decreased;endurance;balance impaired   Comment pt demos slow gait with decreased stride length and step height, fwd posturing; verbal cues provided for correct gait and for safety during turns   Balance   Sitting Balance: Static fair + balance   Sitting, Dynamic (Balance) fair + balance   Sit-to-Stand Balance fair balance   Standing Balance: Static fair balance   Standing Balance: Dynamic fair balance   Systems Impairment Contributing to Balance Disturbance musculoskeletal   Post Treatment Status   Post Treatment Patient Status Call light within reach;Telephone within reach;Patient safety alarm activated;Patient supine in bed   Support Present Post Treatment  None   Communication Post Treatement Care Management   Communication Post Treatment Comment pt status   Physical Therapy Clinical Impression   Assessment Pt ambulated for 40' x1, 30' x 1 using FWW with CGA and verbal cues for safety during turns and to inc step height and stride length. Amb distance limited by fatigue and weakness. Continue to recommend SNF once medically stable for D/C.         Therapist:   Wilber Bihari, PT   Pager #: [phone]

## 2023-09-23 NOTE — Care Plan (Signed)
 Problem: Wound  Goal: Optimal Coping  Outcome: Ongoing (see interventions/notes)  Intervention: Support Patient and Family Response  Recent Flowsheet Documentation  Taken 09/23/2023 1923 by Jeri Cos, GN  Family/Support System Care: support provided  Goal: Optimal Functional Ability  Outcome: Ongoing (see interventions/notes)  Goal: Absence of Infection Signs and Symptoms  Outcome: Ongoing (see interventions/notes)  Intervention: Prevent or Manage Infection  Recent Flowsheet Documentation  Taken 09/23/2023 1923 by Jeri Cos, GN  Fever Reduction/Comfort Measures:   lightweight bedding   lightweight clothing  Goal: Improved Oral Intake  Outcome: Ongoing (see interventions/notes)  Goal: Optimal Pain Control and Function  Outcome: Ongoing (see interventions/notes)  Intervention: Prevent or Manage Pain  Recent Flowsheet Documentation  Taken 09/23/2023 1923 by Jeri Cos, GN  Sleep/Rest Enhancement: awakenings minimized  Goal: Skin Health and Integrity  Outcome: Ongoing (see interventions/notes)  Goal: Optimal Wound Healing  Outcome: Ongoing (see interventions/notes)  Intervention: Promote Wound Healing  Recent Flowsheet Documentation  Taken 09/23/2023 1923 by Jeri Cos, GN  Sleep/Rest Enhancement: awakenings minimized     Problem: Health Knowledge, Opportunity to Enhance (Adult,Obstetrics,Pediatric)  Goal: Knowledgeable about Health Subject/Topic  Description: Patient will demonstrate the desired outcomes by discharge/transition of care.  Outcome: Ongoing (see interventions/notes)  Intervention: Enhance Health Knowledge  Recent Flowsheet Documentation  Taken 09/23/2023 1923 by Jeri Cos, GN  Family/Support System Care: support provided     Problem: Fall Injury Risk  Goal: Absence of Fall and Fall-Related Injury  Outcome: Ongoing (see interventions/notes)  Intervention: Identify and Manage Contributors  Recent Flowsheet Documentation  Taken 09/23/2023 1923 by Jeri Cos, GN  Medication Review/Management: medications reviewed  Intervention:  Promote Injury-Free Environment  Recent Flowsheet Documentation  Taken 09/23/2023 1923 by Jeri Cos, GN  Safety Promotion/Fall Prevention:   activity supervised   fall prevention program maintained   safety round/check completed   nonskid shoes/slippers when out of bed     Problem: Skin Injury Risk Increased  Goal: Skin Health and Integrity  Outcome: Ongoing (see interventions/notes)     Problem: Adult Inpatient Plan of Care  Goal: Plan of Care Review  Outcome: Ongoing (see interventions/notes)  Goal: Patient-Specific Goal (Individualized)  Outcome: Ongoing (see interventions/notes)  Goal: Absence of Hospital-Acquired Illness or Injury  Outcome: Ongoing (see interventions/notes)  Intervention: Identify and Manage Fall Risk  Recent Flowsheet Documentation  Taken 09/23/2023 1923 by Jeri Cos, GN  Safety Promotion/Fall Prevention:   activity supervised   fall prevention program maintained   safety round/check completed   nonskid shoes/slippers when out of bed  Intervention: Prevent Infection  Recent Flowsheet Documentation  Taken 09/23/2023 1923 by Jeri Cos, GN  Infection Prevention: barrier precautions utilized  Goal: Optimal Comfort and Wellbeing  Outcome: Ongoing (see interventions/notes)  Intervention: Provide Person-Centered Care  Recent Flowsheet Documentation  Taken 09/23/2023 1923 by Jeri Cos, GN  Trust Relationship/Rapport: care explained  Goal: Rounds/Family Conference  Outcome: Ongoing (see interventions/notes)     Problem: Gas Exchange Impaired  Goal: Optimal Gas Exchange  Outcome: Ongoing (see interventions/notes)     Problem: Pneumonia  Goal: Fluid Balance  Outcome: Ongoing (see interventions/notes)  Goal: Absence of Infection Signs and Symptoms  Outcome: Ongoing (see interventions/notes)  Intervention: Prevent Infection Progression  Recent Flowsheet Documentation  Taken 09/23/2023 1923 by Jeri Cos, GN  Fever Reduction/Comfort Measures:   lightweight bedding   lightweight clothing  Goal: Effective Oxygenation and  Ventilation  Outcome: Ongoing (see interventions/notes)     Problem: Fatigue  Goal: Improved Activity Tolerance  Outcome: Ongoing (see interventions/notes)  Intervention: Promote Improved Energy  Recent Flowsheet Documentation  Taken 09/23/2023 1923 by Jeri Cos, GN  Sleep/Rest Enhancement: awakenings minimized     Problem: Heart Failure Comorbidity  Goal: Maintenance of Heart Failure Symptom Control  Outcome: Ongoing (see interventions/notes)  Intervention: Maintain Heart Failure Management  Recent Flowsheet Documentation  Taken 09/23/2023 1923 by Jeri Cos, GN  Medication Review/Management: medications reviewed

## 2023-09-23 NOTE — Nurses Notes (Signed)
 Pt is alert and orient X2 during shift. Pts IV was dry and intact. Pt complained of no pain during shift. Pt was bladder scanned x2 during shift, estimated bladder volume = 380. Pt was free from falls during shift. Pts vitals were stable during shift. Pt is currently resting in bed. I have reviewed the plan of care. Will continue to monitor.    BP (!) 110/53   Pulse 83   Temp 36.5 C (97.7 F)   Resp 15   Ht 1.575 m (5\' 2" )   Wt (!) 30.8 kg (67 lb 14.4 oz)   LMP  (LMP Unknown)   SpO2 92%   BMI 12.42 kg/m     Carin Hock, GN

## 2023-09-23 NOTE — Care Plan (Signed)
 Problem: Wound  Goal: Optimal Coping  Outcome: Ongoing (see interventions/notes)  Goal: Optimal Functional Ability  Outcome: Ongoing (see interventions/notes)  Goal: Absence of Infection Signs and Symptoms  Outcome: Ongoing (see interventions/notes)  Intervention: Prevent or Manage Infection  Recent Flowsheet Documentation  Taken 09/22/2023 1943 by Jeri Cos, GN  Fever Reduction/Comfort Measures:   lightweight clothing   lightweight bedding  Goal: Improved Oral Intake  Outcome: Ongoing (see interventions/notes)  Goal: Optimal Pain Control and Function  Outcome: Ongoing (see interventions/notes)  Goal: Skin Health and Integrity  Outcome: Ongoing (see interventions/notes)  Goal: Optimal Wound Healing  Outcome: Ongoing (see interventions/notes)     Problem: Health Knowledge, Opportunity to Enhance (Adult,Obstetrics,Pediatric)  Goal: Knowledgeable about Health Subject/Topic  Description: Patient will demonstrate the desired outcomes by discharge/transition of care.  Outcome: Ongoing (see interventions/notes)     Problem: Fall Injury Risk  Goal: Absence of Fall and Fall-Related Injury  Outcome: Ongoing (see interventions/notes)  Intervention: Identify and Manage Contributors  Recent Flowsheet Documentation  Taken 09/22/2023 1943 by Jeri Cos, GN  Medication Review/Management: medications reviewed  Intervention: Promote Injury-Free Environment  Recent Flowsheet Documentation  Taken 09/22/2023 1943 by Jeri Cos, GN  Safety Promotion/Fall Prevention:   activity supervised   fall prevention program maintained   nonskid shoes/slippers when out of bed   safety round/check completed     Problem: Skin Injury Risk Increased  Goal: Skin Health and Integrity  Outcome: Ongoing (see interventions/notes)     Problem: Adult Inpatient Plan of Care  Goal: Plan of Care Review  Outcome: Ongoing (see interventions/notes)  Goal: Patient-Specific Goal (Individualized)  Outcome: Ongoing (see interventions/notes)  Goal: Absence of Hospital-Acquired  Illness or Injury  Outcome: Ongoing (see interventions/notes)  Intervention: Identify and Manage Fall Risk  Recent Flowsheet Documentation  Taken 09/22/2023 1943 by Jeri Cos, GN  Safety Promotion/Fall Prevention:   activity supervised   fall prevention program maintained   nonskid shoes/slippers when out of bed   safety round/check completed  Intervention: Prevent and Manage VTE (Venous Thromboembolism) Risk  Recent Flowsheet Documentation  Taken 09/22/2023 1943 by Jeri Cos, GN  VTE Prevention/Management: ambulation promoted  Intervention: Prevent Infection  Recent Flowsheet Documentation  Taken 09/22/2023 1943 by Jeri Cos, GN  Infection Prevention: barrier precautions utilized  Goal: Optimal Comfort and Wellbeing  Outcome: Ongoing (see interventions/notes)  Goal: Rounds/Family Conference  Outcome: Ongoing (see interventions/notes)     Problem: Gas Exchange Impaired  Goal: Optimal Gas Exchange  Outcome: Ongoing (see interventions/notes)     Problem: Pneumonia  Goal: Fluid Balance  Outcome: Ongoing (see interventions/notes)  Goal: Absence of Infection Signs and Symptoms  Outcome: Ongoing (see interventions/notes)  Intervention: Prevent Infection Progression  Recent Flowsheet Documentation  Taken 09/22/2023 1943 by Jeri Cos, GN  Fever Reduction/Comfort Measures:   lightweight clothing   lightweight bedding  Goal: Effective Oxygenation and Ventilation  Outcome: Ongoing (see interventions/notes)     Problem: Fatigue  Goal: Improved Activity Tolerance  Outcome: Ongoing (see interventions/notes)     Problem: Heart Failure Comorbidity  Goal: Maintenance of Heart Failure Symptom Control  Outcome: Ongoing (see interventions/notes)  Intervention: Maintain Heart Failure Management  Recent Flowsheet Documentation  Taken 09/22/2023 1943 by Jeri Cos, GN  Medication Review/Management: medications reviewed

## 2023-09-24 ENCOUNTER — Other Ambulatory Visit

## 2023-09-24 ENCOUNTER — Other Ambulatory Visit: Payer: Self-pay

## 2023-09-24 ENCOUNTER — Inpatient Hospital Stay (HOSPITAL_COMMUNITY)

## 2023-09-24 ENCOUNTER — Other Ambulatory Visit (HOSPITAL_BASED_OUTPATIENT_CLINIC_OR_DEPARTMENT_OTHER): Payer: Self-pay | Admitting: Internal Medicine

## 2023-09-24 DIAGNOSIS — B9620 Unspecified Escherichia coli [E. coli] as the cause of diseases classified elsewhere: Secondary | ICD-10-CM

## 2023-09-24 LAB — BASIC METABOLIC PANEL
ANION GAP: 6 mmol/L (ref 4–13)
BUN/CREA RATIO: 27 — ABNORMAL HIGH (ref 6–22)
BUN: 22 mg/dL (ref 8–25)
CALCIUM: 8.7 mg/dL (ref 8.6–10.3)
CHLORIDE: 94 mmol/L — ABNORMAL LOW (ref 96–111)
CO2 TOTAL: 38 mmol/L — ABNORMAL HIGH (ref 23–31)
CREATININE: 0.82 mg/dL (ref 0.60–1.05)
ESTIMATED GFR - FEMALE: 70 mL/min/BSA (ref >=60)
GLUCOSE: 87 mg/dL (ref 65–125)
POTASSIUM: 4.6 mmol/L (ref 3.5–5.1)
SODIUM: 138 mmol/L (ref 136–145)

## 2023-09-24 LAB — CBC
HCT: 35.9 % (ref 34.8–46.0)
HGB: 11.6 g/dL (ref 11.5–16.0)
MCH: 31.6 pg (ref 26.0–32.0)
MCHC: 32.3 g/dL (ref 31.0–35.5)
MCV: 97.8 fL (ref 78.0–100.0)
MPV: 9.1 fL (ref 8.7–12.5)
PLATELETS: 358 10*3/uL (ref 150–400)
RBC: 3.67 10*6/uL — ABNORMAL LOW (ref 3.85–5.22)
RDW-CV: 13.7 % (ref 11.5–15.5)
WBC: 5.8 10*3/uL (ref 3.7–11.0)

## 2023-09-24 LAB — PHOSPHORUS: PHOSPHORUS: 2.6 mg/dL (ref 2.3–4.0)

## 2023-09-24 LAB — MAGNESIUM: MAGNESIUM: 1.9 mg/dL (ref 1.8–2.6)

## 2023-09-24 MED ORDER — IPRATROPIUM 0.5 MG-ALBUTEROL 3 MG (2.5 MG BASE)/3 ML NEBULIZATION SOLN
3.0000 mL | INHALATION_SOLUTION | Freq: Four times a day (QID) | RESPIRATORY_TRACT | 0 refills | Status: DC
Start: 2023-09-24 — End: 2023-09-25

## 2023-09-24 MED ORDER — PANTOPRAZOLE 40 MG TABLET,DELAYED RELEASE
40.0000 mg | DELAYED_RELEASE_TABLET | Freq: Every morning | ORAL | Status: AC
Start: 2023-09-25 — End: 2023-10-25

## 2023-09-24 MED ORDER — FUROSEMIDE 20 MG TABLET
20.0000 mg | ORAL_TABLET | Freq: Every day | ORAL | 11 refills | Status: AC
Start: 2023-09-25 — End: 2024-09-19

## 2023-09-24 MED ORDER — NYSTATIN 100,000 UNIT/ML ORAL SUSPENSION
5.0000 mL | Freq: Four times a day (QID) | ORAL | Status: DC
Start: 2023-09-24 — End: 2023-09-24

## 2023-09-24 MED ORDER — GUAIFENESIN ER 600 MG TABLET, EXTENDED RELEASE 12 HR
600.0000 mg | EXTENDED_RELEASE_TABLET | Freq: Two times a day (BID) | ORAL | 0 refills | Status: AC
Start: 2023-09-24 — End: 2023-10-24

## 2023-09-24 MED ORDER — GUAIFENESIN ER 600 MG TABLET, EXTENDED RELEASE 12 HR
600.0000 mg | EXTENDED_RELEASE_TABLET | Freq: Two times a day (BID) | ORAL | Status: DC
Start: 2023-09-24 — End: 2023-09-24

## 2023-09-24 MED ORDER — IPRATROPIUM 0.5 MG-ALBUTEROL 3 MG (2.5 MG BASE)/3 ML NEBULIZATION SOLN
3.0000 mL | INHALATION_SOLUTION | Freq: Four times a day (QID) | RESPIRATORY_TRACT | Status: DC
Start: 2023-09-24 — End: 2023-09-24

## 2023-09-24 MED ORDER — NYSTATIN 100,000 UNIT/ML ORAL SUSPENSION
5.0000 mL | Freq: Four times a day (QID) | ORAL | 0 refills | Status: AC
Start: 2023-09-24 — End: 2023-10-04

## 2023-09-24 NOTE — Care Plan (Signed)
 Problem: Wound  Goal: Optimal Coping  Outcome: Adequate for Discharge  Goal: Optimal Functional Ability  Outcome: Adequate for Discharge  Goal: Absence of Infection Signs and Symptoms  Outcome: Adequate for Discharge  Goal: Improved Oral Intake  Outcome: Adequate for Discharge  Goal: Optimal Pain Control and Function  Outcome: Adequate for Discharge  Goal: Skin Health and Integrity  Outcome: Adequate for Discharge  Goal: Optimal Wound Healing  Outcome: Adequate for Discharge     Problem: Health Knowledge, Opportunity to Enhance (Adult,Obstetrics,Pediatric)  Goal: Knowledgeable about Health Subject/Topic  Description: Patient will demonstrate the desired outcomes by discharge/transition of care.  Outcome: Adequate for Discharge     Problem: Fall Injury Risk  Goal: Absence of Fall and Fall-Related Injury  Outcome: Adequate for Discharge     Problem: Skin Injury Risk Increased  Goal: Skin Health and Integrity  Outcome: Adequate for Discharge     Problem: Adult Inpatient Plan of Care  Goal: Plan of Care Review  Outcome: Adequate for Discharge  Goal: Patient-Specific Goal (Individualized)  Outcome: Adequate for Discharge  Goal: Absence of Hospital-Acquired Illness or Injury  Outcome: Adequate for Discharge  Intervention: Prevent and Manage VTE (Venous Thromboembolism) Risk  Recent Flowsheet Documentation  Taken 09/24/2023 1027 by Alfonso Ellis, RN  VTE Prevention/Management:   ambulation promoted   dorsiflexion/plantar flexion performed  Goal: Optimal Comfort and Wellbeing  Outcome: Adequate for Discharge  Goal: Rounds/Family Conference  Outcome: Adequate for Discharge     Problem: Gas Exchange Impaired  Goal: Optimal Gas Exchange  Outcome: Adequate for Discharge     Problem: Pneumonia  Goal: Fluid Balance  Outcome: Adequate for Discharge  Goal: Absence of Infection Signs and Symptoms  Outcome: Adequate for Discharge  Goal: Effective Oxygenation and Ventilation  Outcome: Adequate for Discharge     Problem: Fatigue  Goal:  Improved Activity Tolerance  Outcome: Adequate for Discharge     Problem: Heart Failure Comorbidity  Goal: Maintenance of Heart Failure Symptom Control  Outcome: Adequate for Discharge

## 2023-09-24 NOTE — Care Management Notes (Signed)
 Per nursing, MPOA, [name], is refusing SNF and wants to resume HHC.  Updated referral to [facility] medicine HHC for resuming patient's services.  Nursing notified [facility] of MPOA's change of mind.  Dr. Jules Schick aware of changes.  Sherilyn Cooter, CASE MANAGER

## 2023-09-24 NOTE — Nurses Notes (Addendum)
 Attempted to call report to [facility] home health, nursing staff stated she is going through a "dead zone" and will call back. Lonia Chimera, RN  [phone]- Called report to [facility] medicine home health

## 2023-09-24 NOTE — Progress Notes (Signed)
 Internal Medicine Progress Note    Names:  [name]  Date of service: 09/24/2023  Date of Admission:  09/18/2023  Hospital Day:  LOS: 5 days     Assessment/ Plan:   Active Hospital Problems    Diagnosis    Primary Problem: Pneumonia    Community acquired pneumonia of right lower lobe of lung       1. Community-acquired pneumonia.  Today is day 7.  Have Rocephin and doxycycline.  Continue.  Will discontinue antibiotics after today    2. Urinary tract infection.  On Rocephin.  100000 colonies E coli.  Will have completed 7 days of Rocephin tomorrow.  Will discontinue after today    3. 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  Blood pressure borderline today.  Blood pressure remains borderline low.  Holding medications.    4. Chronic constipation.  Did have small bowel movement.  Check KUB    5. Severe protein calorie malnutrition.  Continue to encourage nutritional intake.  Again counseled patient on nutrition    6. Senile dementia.  Mild confusion noted yesterday evening.  Improved today.      PT/OT: Yes    Disposition Planning:   Home with home health    Subjective:  No chest pain or palpitations.  Still some shortness a breath.  Still describes cough and congestion.  No nausea vomiting.  Passing flatus.  No bowel movement  Voiding.    acetaminophen (TYLENOL) tablet, 650 mg, Oral, Q6H PRN  aspirin (ECOTRIN) enteric coated tablet 81 mg, 81 mg, Oral, Daily  atorvastatin (LIPITOR) tablet, 40 mg, Oral, QPM  carvedilol (COREG) tablet, 3.125 mg, Oral, Daily  cefTRIAXone (ROCEPHIN) injection 2 g, 2 g, Intravenous, Q24H  D5W 250 mL flush bag, , Intravenous, Q15 Min PRN  enoxaparin PF (LOVENOX) 30 mg/0.3 mL SubQ injection, 30 mg, Subcutaneous, Q24H  furosemide (LASIX) tablet, 20 mg, Oral, Daily  guaiFENesin (MUCINEX) extended release tablet - for cough (expectorant), 600 mg, Oral, 2x/day  ipratropium-albuterol 0.5 mg-3 mg(2.5 mg base)/3 mL Solution for Nebulization, 3 mL, Nebulization,  4x/day  lactulose (ENULOSE) 10g per 15mL oral liquid, 30 mL, Oral, 2x/day  levothyroxine (SYNTHROID) tablet, 75 mcg, Oral, QAM  [Held by provider] lisinopril (PRINIVIL) tablet, 2.5 mg, Oral, Daily  metoclopramide (REGLAN) tablet, 5 mg, Oral, 3x/day AC  NS 250 mL flush bag, , Intravenous, Q15 Min PRN  NS flush syringe, 3 mL, Intracatheter, Q8HRS  NS flush syringe, 3 mL, Intracatheter, Q1H PRN  nystatin (MYCOSTATIN) 100,000 units per mL oral liquid, 5 mL, Swish & Swallow, 4x/day  ondansetron (ZOFRAN) 2 mg/mL injection, 4 mg, Intravenous, Q6H PRN  pantoprazole (PROTONIX) delayed release tablet, 40 mg, Oral, Daily before Breakfast  potassium chloride (KCLOR-CON) extended release tablet, 10 mEq, Oral, 2x/day-Food  sennosides-docusate sodium (SENOKOT-S) 8.6-50mg  per tablet, 1 Tablet, Oral, 2x/day  simethicone (MYLICON) chewable tablet, 160 mg, Oral, 4x/day        Physical Exam:    Vital Signs:  BP (!) 102/54   Pulse 69   Temp 36.4 C (97.5 F)   Resp 18   Ht 1.575 m (5\' 2" )   Wt (!) 30.8 kg (67 lb 14.4 oz)   LMP  (LMP Unknown)   SpO2 92%   BMI 12.42 kg/m         I/O:  I/O last 24 hours:    Intake/Output Summary (Last 24 hours) at 09/24/2023 1320  Last data filed at 09/24/2023 1200  Gross per 24 hour   Intake  840 ml   Output 1125 ml   Net -285 ml     I/O current shift:  03/13 0700 - 03/13 1859  In: 120 [P.O.:120]  Out: -   Blood Sugars:     General:  Some confusion.  Remains alert and cooperative  Cardio: Regular rate and rhythm. Normal S1 and S2. No murmurs, gallops, or rubs.   Resp:  Crackles on the right.  No bronchospasm noted today.  Abd: Bowel sounds present, Soft, non-tender.  No distention  Extremities: No edema or swelling noted.  No calf pain      Labs:     Results for orders placed or performed during the hospital encounter of 09/18/23 (from the past 24 hours)   CBC   Result Value Ref Range    WBC 5.8 3.7 - 11.0 x10^3/uL    RBC 3.67 (L) 3.85 - 5.22 x10^6/uL    HGB 11.6 11.5 - 16.0 g/dL    HCT 84.1 32.4 -  40.1 %    MCV 97.8 78.0 - 100.0 fL    MCH 31.6 26.0 - 32.0 pg    MCHC 32.3 31.0 - 35.5 g/dL    RDW-CV 02.7 25.3 - 66.4 %    PLATELETS 358 150 - 400 x10^3/uL    MPV 9.1 8.7 - 12.5 fL   BASIC METABOLIC PANEL   Result Value Ref Range    SODIUM 138 136 - 145 mmol/L    POTASSIUM 4.6 3.5 - 5.1 mmol/L    CHLORIDE 94 (L) 96 - 111 mmol/L    CO2 TOTAL 38 (H) 23 - 31 mmol/L    ANION GAP 6 4 - 13 mmol/L    CALCIUM 8.7 8.6 - 10.3 mg/dL    GLUCOSE 87 65 - 403 mg/dL    BUN 22 8 - 25 mg/dL    CREATININE 4.74 2.59 - 1.05 mg/dL    BUN/CREA RATIO 27 (H) 6 - 22    ESTIMATED GFR - FEMALE 70 >=60 mL/min/BSA   MAGNESIUM   Result Value Ref Range    MAGNESIUM 1.9 1.8 - 2.6 mg/dL   PHOSPHORUS   Result Value Ref Range    PHOSPHORUS 2.6 2.3 - 4.0 mg/dL       Micro:   No results found for any visits on 09/18/23 (from the past 96 hours).      Radiology:       Marylee Floras, MD    09/24/2023   10:38  [facility]  [facility]

## 2023-09-24 NOTE — Discharge Summary (Signed)
 DISCHARGE SUMMARY      Date of Service:  09/24/2023  [name], [name], 89 y.o. female  Date of Birth:  [birth year]  PCP: Marylee Floras, MD    ADMISSION DATE:  09/18/2023  DISCHARGE DATE:  09/24/2023    ATTENDING PHYSICIAN: Marylee Floras, MD  PRIMARY CARE PHYSICIAN: Marylee Floras, MD     PRIMARY ADMISSION DIAGNOSIS: Pneumonia    DISCHARGE DIAGNOSES:     1. Community-acquired pneumonia    2. urinary tract infection E coli     3. 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 Chronic constipation    5. Severe protein calorie malnutrition     6. Senile dementia.  Mild.  Active Hospital Problems    Diagnosis Date Noted    Principal Problem: Pneumonia [J18.9] 09/19/2023    Community acquired pneumonia of right lower lobe of lung [J18.9] 09/21/2023      Resolved Hospital Problems   No resolved problems to display.     Active Non-Hospital Problems    Diagnosis Date Noted    COVID 08/07/2023    Pleural effusion 07/12/2023    CHF (congestive heart failure) (CMS HCC) 06/29/2023    Constipation 04/01/2023    Fecal impaction of colon (CMS HCC) 02/05/2023    Protein-calorie malnutrition, unspecified severity (CMS HCC) 09/08/2022    Vitamin D deficiency 12/23/2016    High risk medication use 12/23/2016    HLD (hyperlipidemia) 12/03/2016    Hypothyroidism 06/30/2006        DISCHARGE MEDICATIONS:     Current Discharge Medication List        START taking these medications.        Details   guaiFENesin 600 mg Tablet Extended Release 12hr  Commonly known as: MUCINEX   600 mg, Oral, 2 TIMES DAILY  Refills: 0     ipratropium-albuteroL 0.5 mg-3 mg(2.5 mg base)/3 mL nebulizer solution  Commonly known as: DUONEB   3 mL, Nebulization, 4 TIMES DAILY  Refills: 0     nystatin 100,000 unit/mL Suspension  Commonly known as: MYCOSTATIN   5 mL, Swish & Swallow, 4 TIMES DAILY  Refills: 0     pantoprazole 40 mg Tablet, Delayed Release (E.C.)  Commonly known as: PROTONIX  Start taking on: September 25, 2023   40 mg, Oral,  EVERY MORNING BEFORE BREAKFAST  Refills: 0            CONTINUE these medications which have CHANGED during your visit.        Details   furosemide 20 mg Tablet  Commonly known as: LASIX  Start taking on: September 25, 2023  What changed:   medication strength  how much to take  when to take this   20 mg, Oral, DAILY  Qty: 30 Tablet  Refills: 11            CONTINUE these medications - NO CHANGES were made during your visit.        Details   aspirin 81 mg Tablet, Delayed Release (E.C.)  Commonly known as: ECOTRIN   81 mg, Oral, DAILY  Qty: 30 Tablet  Refills: 11     atorvastatin 40 mg Tablet  Commonly known as: LIPITOR   Take 1 Tablet (40 mg total) by mouth Every evening  Qty: 30 Tablet  Refills: 11     carvediloL 3.125 mg Tablet  Commonly known as: COREG   3.125 mg, Oral, DAILY  Refills: 0  lactulose 20 gram/30 mL Solution  Commonly known as: ENULOSE   30 mL, Oral, 3 TIMES DAILY  Qty: 8100 mL  Refills: 3     levothyroxine 75 mcg Tablet  Commonly known as: SYNTHROID   75 mcg, Oral, EVERY MORNING  Qty: 90 Tablet  Refills: 3     lisinopriL 2.5 mg Tablet  Commonly known as: PRINIVIL   2.5 mg, Oral, DAILY  Qty: 30 Tablet  Refills: 11     metoclopramide HCl 5 mg Tablet  Commonly known as: REGLAN   Take 1 Tablet (5 mg total) by mouth Three times daily before meals  Qty: 90 Tablet  Refills: 11     potassium chloride 10 mEq Tablet Sustained Release  Commonly known as: KLOR-CON   10 mEq, Oral, 2 TIMES DAILY WITH FOOD  Qty: 60 Tablet  Refills: 11     sennosides-docusate sodium 8.6-50 mg Tablet  Commonly known as: SENOKOT-S   1 Tablet, Oral, 2 TIMES DAILY  Qty: 60 Tablet  Refills: 11     simethicone 80 mg Tablet, Chewable  Commonly known as: MYLICON   Chew and swallow 2 Tablets (160 mg total) by mouth Four times a day  Qty: 240 Tablet  Refills: 11            STOP taking these medications.      lidocaine 4 % Adhesive Patch, Medicated              DISCHARGE INSTRUCTIONS:      Refer to Home Health - [facility]  - [place]   Referral Type: Home Health   Requested Specialty: HOME HEALTH AGENCY   Number of Visits Requested: 999       REASON FOR HOSPITALIZATION AND HOSPITAL COURSE:  This is a 89 y.o. female shortness for breath.  Found to have pneumonia.  Treated with 7 day course of Rocephin and doxycycline.  Oxygen was at baseline at discharge.  Did not require home oxygen.  Symptoms have totally resolved.  Chest x-ray demonstrated improvement.  She had significant loss of function.  Being discharged to a skilled nursing facility.      Patient has chronic constipation that was monitored during hospital stay.  This will need to continue to be monitored.  She has severe protein calorie malnutrition.  She received encouragement to eat add food supplements at the time of discharge.    Patient's senile dementia was monitored and remained stable during hospital stay.      Patient is a known dilated ischemic cardiomyopathy.  She remained euvolemic during hospital stay    PHYSICAL EXAM AT DISCHARGE:   Temperature: 36.4 C (97.5 F)  Heart Rate: 69  BP (Non-Invasive): (!) 102/54  Respiratory Rate: 18  SpO2: 92 %  General: no distress  Eyes: Conjunctiva clear., Sclera non-icteric.   Lungs: Breathing nonlabored, Clear to auscultation bilaterally.   Cardiovascular: regular rate and rhythm, S1, S2 normal, no murmur, click, rub or gallop  Abdomen: non-distended, Soft, non-tender, Bowel sounds normal  Extremities.  No edema.  No calf pain    SIGNIFICANT LAB: Lab Results for Last 24 Hours:    Results for orders placed or performed during the hospital encounter of 09/18/23 (from the past 24 hours)   CBC   Result Value Ref Range    WBC 5.8 3.7 - 11.0 x10^3/uL    RBC 3.67 (L) 3.85 - 5.22 x10^6/uL    HGB 11.6 11.5 - 16.0 g/dL  HCT 35.9 34.8 - 46.0 %    MCV 97.8 78.0 - 100.0 fL    MCH 31.6 26.0 - 32.0 pg    MCHC 32.3 31.0 - 35.5 g/dL    RDW-CV 11.9 14.7 - 82.9 %    PLATELETS 358 150 - 400 x10^3/uL    MPV 9.1 8.7 - 12.5 fL   BASIC METABOLIC  PANEL   Result Value Ref Range    SODIUM 138 136 - 145 mmol/L    POTASSIUM 4.6 3.5 - 5.1 mmol/L    CHLORIDE 94 (L) 96 - 111 mmol/L    CO2 TOTAL 38 (H) 23 - 31 mmol/L    ANION GAP 6 4 - 13 mmol/L    CALCIUM 8.7 8.6 - 10.3 mg/dL    GLUCOSE 87 65 - 562 mg/dL    BUN 22 8 - 25 mg/dL    CREATININE 1.30 8.65 - 1.05 mg/dL    BUN/CREA RATIO 27 (H) 6 - 22    ESTIMATED GFR - FEMALE 70 >=60 mL/min/BSA   MAGNESIUM   Result Value Ref Range    MAGNESIUM 1.9 1.8 - 2.6 mg/dL   PHOSPHORUS   Result Value Ref Range    PHOSPHORUS 2.6 2.3 - 4.0 mg/dL       SIGNIFICANT RADIOLOGY:  Reviewed    CONSULTATIONS:  None    PROCEDURES PERFORMED:  None    CONDITION ON DISCHARGE: Alert and VS Stable    DISCHARGE DISPOSITION:  Home discharge     ISSUES FOR OUTPATIENT F/U:   Patient being discharged to local nursing care facility    I spent more than 30 minutes discharging this patient, including review of current and discharge medications, final assessment, diagnosis and treatment of the patient; discussion of hospital course and discharge instructions with the patient and/or family; and arrangements for follow-up care, including appointments, needed prescriptions, referrals, and/or preparation of discharge records.      cc: Primary Care Physician:  Marylee Floras, MD  [address]  [facility] [id number]     [id number] Physician:  No referring provider defined for this encounter.     Marylee Floras, MD3/13/202514:31

## 2023-09-24 NOTE — Nurses Notes (Signed)
 Pt is alert and orient X3 during shift. Pts IV was dry and intact. Pt complained of no pain during shift. Pt was free from falls during shift. Pts vitals were stable during shift. Pt is currently resting in bed. I have reviewed the plan of care. Will continue to monitor.    BP 132/63   Pulse 61   Temp 36.4 C (97.5 F)   Resp 19   Ht 1.575 m (5\' 2" )   Wt (!) 30.8 kg (67 lb 14.4 oz)   LMP  (LMP Unknown)   SpO2 100%   BMI 12.42 kg/m     Carin Hock, GN

## 2023-09-24 NOTE — Care Management Notes (Signed)
 Referral Information  ++++++ Placed Provider #1 ++++++  Case Manager: Shelbie Hutching  Provider Type: Nursing Home/SNF  Provider Name: [facility]  Address:  [address]  [place], [place] [address]  Contact: Osker Mason    Phone: [phone] x  Fax:   Fax: [phone]

## 2023-09-24 NOTE — Care Management Notes (Signed)
 Spoke to [place], in regards to patient discharging to SNF pending medical stability. Lupita Leash is agreeable and understands that Medicare will pay for patient's stay. NH liaison updated. Patient does have a bed at [facility] today, however if patient is not deemed medically stable enough to d/c to SNF today, we may have to reevaluate accepting facilities, [facility] is MPOA first pick as it is closest to home. Shelbie Hutching, SOCIAL WORKER  09/24/2023 08:57

## 2023-09-24 NOTE — Nurses Notes (Addendum)
 Dr. Jules Schick notified that [name] patient's daughter does not want her to discharge to [facility]  secondary to flu and COVID outbreak and is taking patient home at discharge and wants home health for physical therapy.  On call case manager notified to arrange home health.Glenis Smoker, RN      1754:  Dr Jules Schick gave order to cancel transfer to [facility] and arrange for home health. Nursing will notify [facility] Management to obtain freedom of choice upon daughter's arrival to nursing unit.Glenis Smoker, RN    18:05 Inetta Fermo at [facility] notified that transfer to nursing facility was cancelled as patient is going home with [facility].Glenis Smoker, RN    18:12- Dr Melina Modena notified that daughter is present at nursing station to transport her mother home and will need script for duoneb, mucinex and nystatin swish and swallow sent to [facility] in [place].Glenis Smoker, RN

## 2023-09-25 ENCOUNTER — Other Ambulatory Visit (HOSPITAL_BASED_OUTPATIENT_CLINIC_OR_DEPARTMENT_OTHER): Payer: Self-pay | Admitting: Internal Medicine

## 2023-09-25 ENCOUNTER — Other Ambulatory Visit: Payer: Self-pay

## 2023-09-25 ENCOUNTER — Telehealth (HOSPITAL_COMMUNITY): Payer: Self-pay | Admitting: Internal Medicine

## 2023-09-25 MED ORDER — IPRATROPIUM 0.5 MG-ALBUTEROL 3 MG (2.5 MG BASE)/3 ML NEBULIZATION SOLN
3.0000 mL | INHALATION_SOLUTION | Freq: Four times a day (QID) | RESPIRATORY_TRACT | 0 refills | Status: AC
Start: 2023-09-25 — End: 2023-10-25

## 2023-09-25 NOTE — Telephone Encounter (Signed)
 Transition of Care Contact Information  Discharge Date: 09/24/2023  Transition Facility Type--Hospital (Inpatient or Observation)  Facility [facility]  Interactive Contact(s): Completed or attempted contact indicated by Date/Time  Completed Contact: 09/25/2023 10:50 AM  First Attempt Call: 09/25/2023 10:48 AM  Contact Method(s)-- Patient/Caregiver Telephone  Clinical Staff Name/Role who contacted--Shavanna Furnari Peggye Form, BSN, RN  Transition Assessment  Discharge Summary obtained?--Yes  How are you recovering?--Improving  Discharge Meds obtained?--Yes  Discharge medication changes reviewed?--Yes  Full Medication Reconciliation Completed?--No  Medication understanding --knows new medication(s)--knows purpose of medication--has assistance managing medications  Medication Concerns?--No  Have everything needed for recovery?--Yes  Care Coordination:   Patient has transition follow-up appointment date and time?--No  Follow up appointment date:  Specialist Transition Visit planned?  Specialist Transition Visit date:  Patient/caregiver plans to attend transition visit?--Yes  Primary Follow-up Barrier--follow-up appointment needed  Interventions provided --reinforced discharge instructions--med list reviewed with caregiver--patient expresses understanding of follow-up plan  Home Health or DME ordered at discharge?--Yes  Agency has contacted patient to initiate services?--No  Home Health or DME Agency:--[facility]  Clinician/Team notified?--No  Primary reason clinician notified?  Transition Note:Plan:      Patient requests transportation referral for follow up appointment? No  Instructions:     Reviewed After Visit [place] (AVS) and discharge instructions as outlined on AVS. Yes   Agrees to contact PCP for non-acute symptoms during the hours of 8 a.m. - 4 p.m., PCP number provided/reinforced. Yes   Nurse Navigator toll free number, resources available, and 24/7 hours of operation provided to patient and/or caregiver. Yes      Referrals to population health? No    Additional information/assessment:  NN reviewed AVS  with patient's daughter, Dorothyann Peng, including: medication changes;  upcoming appointments;  the importance of PCP follow up appointment.  Patient /daughter will contact  PCP to schedule a follow up appointment. Patient's daughter states they have [facility] folder with agency phone number.  NN reviewed NN line information and phone number. Patient's daughter verbalizes understanding of instructions, information, education and has no further questions.           Dreama Saa, RN    [image]

## 2023-09-25 NOTE — Care Management Notes (Signed)
 Referral Information  ++++++ Placed Provider #1 ++++++  Case Manager: Murvin Donning  Provider Type: Home Health - Return  Provider Name: [facility] [place],  [place], Eduard Roux [facility]  Address:  [address]  [place], [place] [address]  Contact: stephenie bartlett    Phone: [phone] x  Fax:   Fax: [phone]

## 2023-09-29 ENCOUNTER — Other Ambulatory Visit: Payer: Self-pay

## 2023-10-01 ENCOUNTER — Non-Acute Institutional Stay (HOSPICE_FACILITY): Attending: Family Medicine

## 2023-10-01 ENCOUNTER — Telehealth (HOSPITAL_BASED_OUTPATIENT_CLINIC_OR_DEPARTMENT_OTHER): Payer: Self-pay | Admitting: Internal Medicine

## 2023-10-01 ENCOUNTER — Other Ambulatory Visit (HOSPICE_FACILITY): Payer: Self-pay | Admitting: Family Medicine

## 2023-10-01 DIAGNOSIS — J189 Pneumonia, unspecified organism: Secondary | ICD-10-CM

## 2023-10-01 DIAGNOSIS — E46 Unspecified protein-calorie malnutrition: Secondary | ICD-10-CM

## 2023-10-01 DIAGNOSIS — F0390 Unspecified dementia without behavioral disturbance: Secondary | ICD-10-CM

## 2023-10-01 DIAGNOSIS — J90 Pleural effusion, not elsewhere classified: Secondary | ICD-10-CM

## 2023-10-01 DIAGNOSIS — I509 Heart failure, unspecified: Secondary | ICD-10-CM

## 2023-10-02 ENCOUNTER — Other Ambulatory Visit: Payer: Self-pay

## 2023-10-03 ENCOUNTER — Other Ambulatory Visit: Payer: Self-pay

## 2023-10-03 MED ORDER — MORPHINE CONCENTRATE 100 MG/5 ML (20 MG/ML) ORAL SOLUTION
5.0000 mg | ORAL | 0 refills | Status: AC | PRN
Start: 2023-10-03 — End: ?
  Filled 2023-10-03: qty 15, 10d supply, fill #0

## 2023-10-03 MED ORDER — HALOPERIDOL LACTATE 2 MG/ML ORAL CONCENTRATE
2.0000 mg | ORAL | 0 refills | Status: AC | PRN
Start: 2023-10-03 — End: ?
  Filled 2023-10-03: qty 15, 3d supply, fill #0

## 2023-10-03 MED ORDER — LORAZEPAM 1 MG TABLET
1.0000 mg | ORAL_TABLET | Freq: Four times a day (QID) | ORAL | 0 refills | Status: DC | PRN
Start: 2023-10-03 — End: 2023-11-05
  Filled 2023-10-03: qty 10, 3d supply, fill #0

## 2023-10-03 MED ORDER — ONDANSETRON 4 MG DISINTEGRATING TABLET
4.0000 mg | ORAL_TABLET | Freq: Four times a day (QID) | ORAL | 0 refills | Status: AC | PRN
Start: 2023-10-03 — End: ?
  Filled 2023-10-03: qty 6, 2d supply, fill #0

## 2023-10-03 MED ORDER — HYOSCYAMINE 0.125 MG SUBLINGUAL TABLET
0.1250 mg | SUBLINGUAL_TABLET | SUBLINGUAL | 0 refills | Status: AC | PRN
Start: 2023-10-03 — End: ?
  Filled 2023-10-03: qty 10, 2d supply, fill #0

## 2023-10-04 ENCOUNTER — Other Ambulatory Visit: Payer: Self-pay

## 2023-10-04 ENCOUNTER — Encounter (HOSPICE_FACILITY): Payer: Self-pay

## 2023-10-04 MED ORDER — IPRATROPIUM 0.5 MG-ALBUTEROL 3 MG (2.5 MG BASE)/3 ML NEBULIZATION SOLN
3.0000 mL | INHALATION_SOLUTION | RESPIRATORY_TRACT | Status: AC | PRN
Start: 2023-10-04 — End: ?

## 2023-10-04 MED ORDER — OXYGEN
1.0000 L/min | GAS_FOR_INHALATION | RESPIRATORY_TRACT | Status: AC
Start: 2023-10-04 — End: ?

## 2023-10-04 NOTE — Hospice Plan of Care (Addendum)
 10/04/2023:      ADMIT ORDERS - STANDING ADMISSION ORDERS1.  Admit to hospice services.2.  Complete initial assessment including history taking, physical, psychological and             spiritual assessment.3.  Assess vitals prn.4.  Assess and provide hygiene care as needed.5.  Turn and position every 2 hours and as needed.6.  Provide active and-or passive ROM exercises as needed.7.  Referral to [employer] for the purpose of obtaining volunteer support as          needed.8.  Provide family-patient-caregiver education (reinforcement of information) about the          specific disease process and home care requirements as needed.9.   Provide emotional and spiritual support prn.10. Provide bereavement counseling for the family.11. Insert, and change foley catheter every 30 days and as needed for urinary retention.Start with a 16 French foley; may increase to an 18 fr., if needed.Use a 30cc balloon only if directed by a physician.In female patients, may insert a 14 Coude catheter as needed for patients with        history of prostrate disease.12. May teach home caregiver to irrigate foley catheter with 60cc normal saline 4 times         daily as needed for increased sediment.13. Assist with obtaining equipment-supplies as needed for patient decline.14. Referral to PT, OT, or Speech therapies as needed for patient decline or safety                issues in home.15. Manual removal of inpaction as needed for constipation of 3 days or more.16. Initiate the following laxative protocol if patient has no BM for 3 days.      Patient is to remain on dose in use when BM resumes.  If patient has difficulty                 swallowing may substitute Senna liquid for tables (1 tsp liquid = 1 tablet).  May add          100mg  Colace liquid per tsp. Of Senna liquid in use, if stool softener needed.1st day- Senokot S 2 tabs by mouth at bedtime.2nd day- If no BM within 24 hours, Senokot S 2 tabs by mouth twice daily.3rd day- If no  BM within 24 hours, Senokot S 3 tabs by mouth twice daily.4th day- If no BM within 24 hours, Senokot S 4 tabs by mouth twice daily.5th day- If no BM within 24 hours, notify physician.If BM becomes too loose, or frequent, decrease by 2 tabs daily until normal BM resumes.For symptoms of constipation, in addition to the above Senokot S orders:7.   If no BM in 2 days, give MOM 30 mls by mouth daily until patient has a BM.8.   If MOM ineffective in 24 hours or the patient complains of discomfort check for           impaction and remove as needed.9.   If no BM in 3 days, insert Glycerin suppository per rectum daily as needed until          patient has a BM.10.  After 4 days without a BM, patient to receive a fleets enema or soap suds                   enema daily as needed for constipation (unless contraindicated).17. May place in the home a Comfort Pack to be used as designated in separate orders.18. Robitussin cough syrup 2  teaspoon by mouth every 4 hours as needed for cough.19. Acetaminophen  tablets 325mg  2 tablets by mouth every 4hr as needed for pain               rated 1-3 or temperature elevation. May continue Tylenol  in conjunction with                    Ibuprofen  for elevated temperature.20. Ibuprofen  600mg  every 6hr by mouth as needed for pain rated 4-6, or if Tylenol                ineffective for pain.21. Nystatin  (100,000 units/ml) 1Tsp Swish & Swallow by mouth four times a day as              needed for signs of oral yeast.22. Ketoprofen 150mg /1cc PLO or gel transdermally to painful bony areas three times           daily as needed not to exceed 6 doses in 24 hours.23. Cloraseptic spray - 5 sprays to throat, then swallow, every 2 hours as needed for             sore throat.If the Compazine from the comfort care pack is ineffective after 2nd dose discontinue and the following may be initiated:24. Phenergan 25mg  tablets 1 orally every 4 hours as needed for nausea.25. Phenergan suppository 25mg  rectally every 4  hours as needed for nausea.26. May initiate the following medications from the comfort care pack physician to       be notified in writing. Acetaminophen  650mg  suppository 1 every 6 hours rectally as needed for pain rated 1-3 or temperature elevation.Lorazepam  1mg  tablet by mouth every 6 hours as needed for anxiety.Above dose of Lorazepam  may be initiated for restlessness as needed.Atropine 1% ophthalmic drops place 2 drops under the tongue every 4 hours as needed for excessive oral secretions.  If ineffective after 2 doses, increase to 4 drops every 4 hours as needed.The remaining meds in the comfort care pack will be initiated at the discretion of the physician.27. Treat skin changes using the following protocols:      Dry Skin:Convetec II skin moisturizer daily and as needed.      Skin irritation due to fungal infection:Nystatin  powder or Extra Thick Antifungal cream topically to affected area daily and as needed.      Denuded skin:EPC/Convetec III topically to affected area daily and as needed.      Scratches and skin tears:Clean with dermal wound cleaner, pat dry apply Polysporin powder                       topically, daily and as needed.  May apply dry sterile dressing if wound is             draining.28. Treat pressure ulcers by stage using the following protocols:      Stage I: Wash area with dermal wound cleaner, gently massaging areas, pat dry,            apply Mepilex to red, unbroken areas.      Stage II:  Clean with dermal wound cleaner, pat dry, apply Mepilex Gentle Border           dressing every 7 days or as needed.      Stage III and Stage IV:  Require Physician contact for orders.29. Oxygen  in home 1-5 L per minute per nasal canula as needed with humidity.30. Dietary consult and nutritional counseling as needed for decreased nutritional  intake.31. May change by mouth medications to equal dose of liquid, transdermal compound           (PLO or gel), or rectal suppository, as patient  loses the ability to swallow.32. Thick-It -4 tsp per 4 oz fluid as needed for difficulty swallowing. 33. Initiate dietary supplements as needed for decreased nutritional intake.Ensure 1-3 cans daily by mouth as desired.Ensure Clear 1-3 servings daily by mouth as desired.Ensure pudding 1-3 daily by mouth as desired.Glucerna 1-3 servings daily by mouth as desired.If unable to reach me, I authorize the [facility] Medicine Hospice Director to prescribe for pain and symptom relief.[facility]ospice will notify me of any changes in my patient's condition.I understand that all inpatient and outpatient medical services needs to be discussed with administrative of hospice prior to schedule of any testing or procedures.  Failure to comply may result in the patient/families receiving a bill for unauthorized services.The hospice program is designed to be an extension of the primary physician where the patient and family are a unit of care.  My consent is based on a diagnosis that the patient is suffering from a life-limiting condition with an anticipated prognosis of 6 months or less and that the program is for palliative care focusing on the alleviation of pain and other symptoms.  I understand as Primary Physician, that I will be the Physician to sign the death certificate and give permission for the removal of the body at the time of death.    --------    DME - New DME: Oxygen  concentrator    --------    DME - New DME: Nebulizer    --------    DME - New DME: Seated walker    --------    DME - New DME: Bath chair    --------    DME - New DME: Raised toilet seat    --------    INTERVENTION - Assist with spiritual resources (Freq: Selected Visits) Start: 10/04/2023      --------    INTERVENTION - Educate on standard precautions (Freq: Each Visit) Start: 10/04/2023      --------    INTERVENTION - Emergent Planning (Freq: PRN) Start: 10/04/2023      --------    INTERVENTION - Fall Prevention Education (Freq: Each Visit) Start: 10/04/2023       --------    INTERVENTION - Initiate comfort care pack (Freq: Each Visit) Start: 10/04/2023      --------    INTERVENTION - Initiative standing orders (Freq: Each Visit) Start: 10/04/2023      --------    INTERVENTION - Instruct infection prevention (Freq: Each Visit) Start: 10/04/2023      --------    INTERVENTION - Instruct on grief process and coping strategies (Freq: Each Visit) Start: 10/04/2023      --------    INTERVENTION - Instruct on proper use of home oxygen  (Freq: Each Visit) Start: 10/04/2023      --------    INTERVENTION - Instruct on strategies to reduce injury in the home (Freq: Each Visit) Start: 10/04/2023      --------    INTERVENTION - Instruct patient/family on wound care (Freq: Each Visit) Start: 10/04/2023      --------    INTERVENTION - Instruct patient/family/caregiver on infection control and safe disposal of dressing materials (Freq: Each Visit) Start: 10/04/2023      --------    INTERVENTION - Instruct positioning (Freq: Each Visit) Start: 10/04/2023      --------    INTERVENTION - Instruct:  hospice orientation (Freq: Selected Visits) Start: 10/04/2023      --------    INTERVENTION - Instruct: hospice philosophy (Freq: Each Visit) Start: 10/04/2023      --------    INTERVENTION - Instruct: medical equipment (Freq: Selected Visits) Start: 10/04/2023      --------    INTERVENTION - Instruct: medical power of attorney and will (Freq: Each Visit) Start: 10/04/2023      --------    INTERVENTION - Instruct: terminal illness (Freq: Selected Visits) Start: 10/04/2023      --------    INTERVENTION - Observe incisions, wounds, or drain sites (Freq: Each Visit) Start: 10/04/2023      --------    INTERVENTION - Offer ongoing support/referrals to community resources (Freq: Selected Visits) Start: 10/04/2023      --------    INTERVENTION - Perform monthly arm circumerference. (Freq: Selected Visits) Start: 10/04/2023      --------    INTERVENTION - Pressure relief (Freq: Each Visit) Start: 10/04/2023      --------     INTERVENTION - Provide grief counseling/education (Freq: Each Visit) Start: 10/04/2023      --------    INTERVENTION - Provide spiritual support (Freq: Each Visit) Start: 10/04/2023      --------    INTERVENTION - Refer to grief support in community (Freq: Each Visit) Start: 10/04/2023      --------    INTERVENTION - Skilled assessment skin (Freq: Each Visit) Start: 10/04/2023      --------    INTERVENTION - Teach grief process and stages of grief (Freq: Each Visit) Start: 10/04/2023      --------    INTERVENTION - Teach nutrition (Freq: Each Visit) Start: 10/04/2023      --------    INTERVENTION - Wound care (Freq: Selected Visits) Start: 10/04/2023      --------    INTERVENTION - Wound Measurements (Freq: Each Visit) Start: 10/04/2023      --------    INTERVENTION - Wound supply (Freq: Each Visit) Start: 10/04/2023      --------    INTERVENTION - Assess patient understanding related to oxygen  misuse (Freq: Each Visit) Start: 10/04/2023      --------    INTERVENTION - Assess understanding of fire risks to patient home or neighborhood related to oxygen  (Freq: Each Visit) Start: 10/04/2023      --------    INTERVENTION - Assess understanding of risks with noncompliance with oxygen  safety (Freq: Each Visit) Start: 10/04/2023      --------    INTERVENTION - Eduate patient of harm related to oxygen  misuse (Freq: Each Visit) Start: 10/04/2023      --------    INTERVENTION - Educate on damage related to noncompliance with oxygen  (Freq: Each Visit) Start: 10/04/2023      --------    INTERVENTION - Educate patient on risk of fire/injury/death (Freq: Each Visit) Start: 10/04/2023      --------    INTERVENTION - Assess expectations before and after death (Freq: Each Visit) Start: 10/04/2023      --------    INTERVENTION - Assess grief responses (Freq: Each Visit) Start: 10/04/2023      --------    INTERVENTION - Assess home environment (Freq: Each Visit) Start: 10/04/2023      --------    INTERVENTION - Assess home for safety and  appropriateness for DME equipment. (Freq: Each Visit) Start: 10/04/2023      --------    INTERVENTION - Assess home for safety and appropriateness for DME equipment. (Freq: Each Visit)  Start: 10/04/2023      --------    INTERVENTION - Assess Oxygen  Safety Practices (Freq: Each Visit) Start: 10/04/2023      --------    INTERVENTION - Assess Patient Functional Limitations (Freq: Each Visit) Start: 10/04/2023      --------    INTERVENTION - Assess Patient Safety Measures in Home (Freq: PRN) Start: 10/04/2023      --------    INTERVENTION - Assess Patient's Activity Level (Freq: PRN) Start: 10/04/2023      --------    INTERVENTION - Assess Patient's Mental Status Each Visit (Freq: Each Visit) Start: 10/04/2023      --------    INTERVENTION - Assess Patient's Prognosis (Freq: PRN) Start: 10/04/2023      --------    INTERVENTION - Assess spiritual needs (Freq: Each Visit) Start: 10/04/2023      --------    INTERVENTION - Assess the Presence of Advanced Directives (Freq: PRN) Start: 10/04/2023      --------    INTERVENTION - Assist family in saying goodbye (Freq: Each Visit) Start: 10/04/2023      --------    INTERVENTION - Assist with clergy contact (Freq: Selected Visits) Start: 10/04/2023      --------    MED - oxygen  (O2) Inhalation gas (New)    --------    MED - ipratropium-albuterol  0.5 mg-3 mg(2.5 mg base)/3 mL Solution for Nebulization (New)    --------    MED - no order text    --------    VISIT - SN visits from 10/04/2023 to 12/26/2023 (New)    --------    VISIT - MSW visits from 10/04/2023 to 10/08/2023    --------    VISIT - Chaplain visits from 10/04/2023 to 12/26/2023

## 2023-10-04 NOTE — Hospice (Signed)
 Patient identified by name and address.    [birth year]. 89 year old female  2.  1st benefit period  3. PMH (personal medical history): CHF, pleural effusion, COPD, COVID, Vit D deficiency, HLD, hypothyroidism, Hx UTI, Hx pneu, O2 dependence  4. Hospice Diagnosis:  Severe protein calorie malnutrition  5. PPS: 30%  6. Sleeping how many hours 12-15/day  7. ADLs (how many does patient need help with) max all  8. Arm Circumference: 16 cm  L UA  9. MPOA/HCS/person of contact: Josh Niemann [phone]  10. LDWA's (Lines/Drains/Wounds/Air ways: O2 2l nc  11. Upcoming appointments/diagnostic testing: Dr Christa Course   12. Top priorities and/or safety concerns: falls risk  13.  Hospice philosophy reviewed with daughter [name], encouraged family to call with questions or concerns.  Education given regarding calling hospice before pt/family calls 9-1-1, pt goes to doctor/hospital etc.  Family verbalizes understanding.  13.  Skilled nursing visits 2 x a week,        Aide 0 x week,    14.  Code status:  DNR  15. Delivered: Bedpads, Mouth swabs  16. //////NEEDS AT FIRST VISIT///// CCP    89 year old female patient with severe protein calorie malnutrition, comorbids as documented. Pt is alert and oriented, hard of hearing and poor eyesight. Pt is cachectic, poor skin turgor, weak and unable to stand or perform self ADLs. Pt's chart reflects a height of 5'2" and weight of 67 lbs, with a BMI of 12.3. Prominent bones of skull, clavicles, and ribs evident. Pt has a MUAC of 16, will need a pediatric BP cuff for accurate readings. Pt recently had a hospitalization where she was treated for a UTI and pneumonia, after this pt has been unable to walk, appetite has decreased to a few bites of meals. Pt is unable to take "milk-like" supplements, declines berry supplements, and is eating pureed foods. Pt wears small depends. Daughter reports pt often declines food due to nausea after meals, reglan  is used prior to meals. Pt uses maalox and protonix . Since  recent hospitalization to treat pneumonia, pt is newly incontinent of bowel and bladder. Pt has bruises to bilateral upper arms, erythema to sacrum related to shearing, and anterior bilateral abrasions to her bilateral calves. Pt denies pain today but does complain of shortness of breath, new onset of O2 dependence since hospitalization. Pt's saturation is WNL but continues to endorse short of breath. Pt owns a small portable concentrator but will need one ordered from [employer]. Transfers done with pt seated on rollator walker. Pt is able to sit up on a bath chair while daughter assists with bathing. Declining aide currently, open to MSW and chaplain. ///Pt would like to become a Land donor if she meets criteria////    CURRENT GOALS:     IDT Goal: Lizmary will be treated with dignity and have symtoms managed in her home by hospice team for the rest of her life.    Patient/family Goal: Patient does not wish to leave the house for physician appointments.      Facility Goal (if applicable): n/a                                                                  DIRECTION PORTION OF ADMISSION  MP[facility] INFORMATION   Josh Niemann [phone]    [facility] INFORMATION   ////Pt wishes to be a Land Donor////    ORGAN/BODY DONATION YES OR NO   Yes-not set up yet    VETERAN YES OR NO (If a Veteran please put which branch)  no    AUTOPSY YES OR NO   no    ME CASE YES OR NO   no      LCD Appendix  For Determining Prognosis  Non-Disease Specific           Patients will be considered to have a life expectancy of six months or less if there is documented evidence of decline in clinical status based on the guidelines listed below. Since determination of decline presumes assessment of the patient's status over time, it is essential that both baseline and follow-up determinations be reported where appropriate. Baseline data may be established on admission to hospice or by using existing information from records. Other clinical  variables not on this list may support a six-month or less life expectancy. These should be documented in the clinical record.    These changes in clinical variables apply to patients whose decline is not considered to be reversible. They are listed in order of their likelihood to predict poor survival, the most predictive first and the least predictive last. No specific number of variables must be met, but fewer of those listed first (more predictive) and more of those listed last (least predictive) would be expected to predict longevity of six months or less.    Part I. Decline in clinical status guidelines    A. yes.  Progression of disease as documented by worsening clinical status, symptoms, signs and laboratory results.     Clinical Status:    Recurrent or intractable infections such as pneumonia, sepsis or pyelonephritis.  Progressive inanition as documented by:   Weight loss of at least 10% body weight in the prior six months, not due to reversible causes such as depression or use of diuretics.  Decreasing anthropomorphic measurements (mid-arm circumference, abdominal girth), not due to reversible causes such as depression or use of diuretics.  Observation of ill-fitting clothes, decrease in skin turgor, increasing skin folds or other observation of weight loss in a patient without documented weight.  Decreasing serum albumin or cholesterol.        Symptoms:  Dyspnea with increasing respiratory rate and Cough, intractable    Signs:  Pleural / pericardial effusion and Weakness    Laboratory: (When available. Lab testing is not required to establish hospice eligibility)  Increasing pCO2 or decreasing pO2 or decreasing SaO2           B. yes.  Decline in Karnofsky Performance Status (KPS) or Palliative Performance Score (PPS) due to progression of disease.    C. no.   Progressive decline in Functional Assessment Staging (FAST) for dementia (from ?7A on the FAST)    D. yes.  Progression to dependence on  assistance with additional activities of daily living (See Part II, Section 2)    E. no.  Progressive stage 3-4 pressure ulcers in spite of optimal care    F. yes. History of increasing emergency room visits, hospitalizations, or physician's visits related to hospice primary diagnosis prior to election of the hospice benefit.      Part Il. Non-disease specific baseline guidelines (both A and B should be met)    A.  Physiologic impairment of functional status as demonstrated by: Karnofsky  Performance Status (KPS) or Palliative Performance Score (PPS) < 70%. Note that two of the disease specific guidelines (HIV Disease, Stroke and Coma) establish a lower qualifying KPS or PPS.    Check level:30% totally bedbound, unable to do any activity due to extensive disease, total-care, normal or reduced intake, full or drowsy with +/- confusion.      B.  Dependence on assistance for two or more activities of daily living (ADLs)          Check activities in which patient is dependent:  ambulation, continence, transfer, dressing, feeding and bathing.     C. Co-morbidities - although not the primary hospice diagnosis, the presence of disease such as the following, the severity of which is likely to contribute to a life expectancy of six months or less, should be considered in determining hospice eligibility.   Chronic obstructive pulmonary disease and Congestive heart failure

## 2023-10-04 NOTE — Hospice Plan of Care (Addendum)
 The hospice plan of care has been discussed with the patient and patient representative.    Plan of care level of understanding is high.    Plan of care level of involvement is high.    Plan of care level of agreement is high.

## 2023-10-04 NOTE — Hospice Plan of Care (Addendum)
 Patient: [name]    Date: 10/04/2023  Time: 16:51    HSPC Nurse Notes  Patient identified by name and address.    55. 89 year old female  2.  1st benefit period  3. PMH (personal medical history): CHF, pleural effusion, COPD, COVID, Vit D deficiency, HLD, hypothyroidism, Hx UTI, Hx pneu, O2 dependence  4. Hospice Diagnosis:  Severe protein calorie malnutrition  5. PPS: 30%  6. Sleeping how many hours 12-15/day  7. ADLs (how many does patient need help with) max all  8. Arm Circumference: 16 cm  L UA  9. MPOA/HCS/person of contact: Josh Niemann [phone]  10. LDWA's (Lines/Drains/Wounds/Air ways: O2 2l nc  11. Upcoming appointments/diagnostic testing: Dr Christa Course   12. Top priorities and/or safety concerns: falls risk  13.  Hospice philosophy reviewed with daughter [name], encouraged family to call with questions or concerns.  Education given regarding calling hospice before pt/family calls 9-1-1, pt goes to doctor/hospital etc.  Family verbalizes understanding.  13.  Skilled nursing visits 2 x a week,        Aide 0 x week,    14.  Code status:  DNR  15. Delivered: Bedpads, Mouth swabs  16. //////NEEDS AT FIRST VISIT///// CCP    89 year old female patient with severe protein calorie malnutrition, comorbids as documented. Pt is alert and oriented, hard of hearing and poor eyesight. Pt is cachectic, poor skin turgor, weak and unable to stand or perform self ADLs. Pt's chart reflects a height of 5'2" and weight of 67 lbs, with a BMI of 12.3. Prominent bones of skull, clavicles, and ribs evident. Pt has a MUAC of 16, will need a pediatric BP cuff for accurate readings. Pt recently had a hospitalization where she was treated for a UTI and pneumonia, after this pt has been unable to walk, appetite has decreased to a few bites of meals. Pt is unable to take "milk-like" supplements, declines berry supplements, and is eating pureed foods. Pt wears small depends. Daughter reports pt often declines food due to nausea  after meals, reglan  is used prior to meals. Pt uses maalox and protonix . Since recent hospitalization to treat pneumonia, pt is newly incontinent of bowel and bladder. Pt has bruises to bilateral upper arms, erythema to sacrum related to shearing, and anterior bilateral abrasions to her bilateral calves. Pt denies pain today but does complain of shortness of breath, new onset of O2 dependence since hospitalization. Pt's saturation is WNL but continues to endorse short of breath. Pt owns a small portable concentrator but will need one ordered from [employer]. Transfers done with pt seated on rollator walker. Pt is able to sit up on a bath chair while daughter assists with bathing. Declining aide currently, open to MSW and chaplain. ///Pt would like to become a Land donor if she meets criteria////    CURRENT GOALS:     IDT Goal: Joycelin will be treated with dignity and have symtoms managed in her home by hospice team for the rest of her life.    Patient/family Goal: Patient does not wish to leave the house for physician appointments.      Facility Goal (if applicable): n/a                  Signed by: Ardyth Kroner. Bobetta Burrows, RN

## 2023-10-05 ENCOUNTER — Other Ambulatory Visit (HOSPICE_FACILITY)

## 2023-10-05 ENCOUNTER — Other Ambulatory Visit: Payer: Self-pay

## 2023-10-05 ENCOUNTER — Ambulatory Visit (HOSPITAL_BASED_OUTPATIENT_CLINIC_OR_DEPARTMENT_OTHER): Payer: Self-pay | Admitting: Internal Medicine

## 2023-10-05 MED ORDER — OXYGEN - NASAL CANULA
1.0000 L/min | GAS_FOR_INHALATION | RESPIRATORY_TRACT | Status: AC
Start: 2023-10-05 — End: ?

## 2023-10-05 NOTE — Hospice (Signed)
 88yo female admitted with Severe protein-calorie malnutrition. Pt resting on couch with feet elevated. Pt reports she just doesn't feel well today. Requesting to go to bed. Daughter reports she c/o difficulty breathing earlier and pulse ox. is in the 90's. Poor appetite of pureed food. Pt incontinent of bowel and bladder. Must have help getting up to sit on the rollator and be transfered to the bedroom or bathroom. Delivered CCP and educated daughter on contents and when to use. Delivered 1 tube of zinc cream for patient's back. Encouraged to call for any needs or concerns.

## 2023-10-06 ENCOUNTER — Non-Acute Institutional Stay: Payer: Self-pay

## 2023-10-06 ENCOUNTER — Other Ambulatory Visit

## 2023-10-06 ENCOUNTER — Telehealth (HOSPITAL_BASED_OUTPATIENT_CLINIC_OR_DEPARTMENT_OTHER): Payer: Self-pay | Admitting: Internal Medicine

## 2023-10-06 ENCOUNTER — Non-Acute Institutional Stay (HOSPICE_FACILITY): Payer: Self-pay

## 2023-10-06 NOTE — Telephone Encounter (Signed)
 Yes

## 2023-10-06 NOTE — Telephone Encounter (Signed)
 [name] called from hospice and asked if you would still be the attending Dr. For [name] since she is on Hospice.  Maeola Schmidt, MA

## 2023-10-06 NOTE — Telephone Encounter (Signed)
 Notified Music therapist at [facility].  Maeola Schmidt, MA

## 2023-10-06 NOTE — Hospice (Signed)
 Name and DOB recogntion confirmed by daughter/MPOA       10/06/23  new admission initial spiritual care visit and assessment to patient who lives with her daughter/MPOA [name] , patient was sitting in the couch upon arrival , she was finishing eating , talked with patient about her history and backgeround and about her faith , her daughter was present during visit , offered support to patient and daughter

## 2023-10-07 ENCOUNTER — Non-Acute Institutional Stay (HOSPICE_FACILITY): Payer: Self-pay

## 2023-10-07 ENCOUNTER — Non-Acute Institutional Stay: Payer: Self-pay

## 2023-10-07 ENCOUNTER — Ambulatory Visit: Payer: Self-pay | Admitting: Rehabilitative and Restorative Service Providers"

## 2023-10-07 NOTE — Hospice Plan of Care (Addendum)
 10/06/2023:      VISIT - SN visits from 10/04/2023 to 12/26/2023 (Changed)      10/05/2023:      INTERVENTION - Assess Oxygen  Safety Practices (Freq: Each Visit) Start: 10/04/2023      --------    INTERVENTION - Instruct positioning (Freq: Each Visit) Start: 10/04/2023      --------    INTERVENTION - Pressure relief (Freq: Each Visit) Start: 10/04/2023      --------    MED - oxygen  (O2) Inhalation gas (New)    --------    MED - no order text      10/04/2023:      ADMIT ORDERS - STANDING ADMISSION ORDERS1.  Admit to hospice services.2.  Complete initial assessment including history taking, physical, psychological and             spiritual assessment.3.  Assess vitals prn.4.  Assess and provide hygiene care as needed.5.  Turn and position every 2 hours and as needed.6.  Provide active and-or passive ROM exercises as needed.7.  Referral to [employer] for the purpose of obtaining volunteer support as          needed.8.  Provide family-patient-caregiver education (reinforcement of information) about the          specific disease process and home care requirements as needed.9.   Provide emotional and spiritual support prn.10. Provide bereavement counseling for the family.11. Insert, and change foley catheter every 30 days and as needed for urinary retention.Start with a 16 French foley; may increase to an 18 fr., if needed.Use a 30cc balloon only if directed by a physician.In female patients, may insert a 14 Coude catheter as needed for patients with        history of prostrate disease.12. May teach home caregiver to irrigate foley catheter with 60cc normal saline 4 times         daily as needed for increased sediment.13. Assist with obtaining equipment-supplies as needed for patient decline.14. Referral to PT, OT, or Speech therapies as needed for patient decline or safety                issues in home.15. Manual removal of inpaction as needed for constipation of 3 days or more.16. Initiate the following laxative  protocol if patient has no BM for 3 days.      Patient is to remain on dose in use when BM resumes.  If patient has difficulty                 swallowing may substitute Senna liquid for tables (1 tsp liquid = 1 tablet).  May add          100mg  Colace liquid per tsp. Of Senna liquid in use, if stool softener needed.1st day- Senokot S 2 tabs by mouth at bedtime.2nd day- If no BM within 24 hours, Senokot S 2 tabs by mouth twice daily.3rd day- If no BM within 24 hours, Senokot S 3 tabs by mouth twice daily.4th day- If no BM within 24 hours, Senokot S 4 tabs by mouth twice daily.5th day- If no BM within 24 hours, notify physician.If BM becomes too loose, or frequent, decrease by 2 tabs daily until normal BM resumes.For symptoms of constipation, in addition to the above Senokot S orders:7.   If no BM in 2 days, give MOM 30 mls by mouth daily until patient has a BM.8.   If MOM ineffective in 24 hours or the patient complains of discomfort check for  impaction and remove as needed.9.   If no BM in 3 days, insert Glycerin suppository per rectum daily as needed until          patient has a BM.10.  After 4 days without a BM, patient to receive a fleets enema or soap suds                   enema daily as needed for constipation (unless contraindicated).17. May place in the home a Comfort Pack to be used as designated in separate orders.18. Robitussin cough syrup 2 teaspoon by mouth every 4 hours as needed for cough.19. Acetaminophen  tablets 325mg  2 tablets by mouth every 4hr as needed for pain               rated 1-3 or temperature elevation. May continue Tylenol  in conjunction with                    Ibuprofen  for elevated temperature.20. Ibuprofen  600mg  every 6hr by mouth as needed for pain rated 4-6, or if Tylenol                ineffective for pain.21. Nystatin  (100,000 units/ml) 1Tsp Swish & Swallow by mouth four times a day as              needed for signs of oral yeast.22. Ketoprofen 150mg /1cc PLO or gel  transdermally to painful bony areas three times           daily as needed not to exceed 6 doses in 24 hours.23. Cloraseptic spray - 5 sprays to throat, then swallow, every 2 hours as needed for             sore throat.If the Compazine from the comfort care pack is ineffective after 2nd dose discontinue and the following may be initiated:24. Phenergan 25mg  tablets 1 orally every 4 hours as needed for nausea.25. Phenergan suppository 25mg  rectally every 4 hours as needed for nausea.26. May initiate the following medications from the comfort care pack physician to       be notified in writing. Acetaminophen  650mg  suppository 1 every 6 hours rectally as needed for pain rated 1-3 or temperature elevation.Lorazepam  1mg  tablet by mouth every 6 hours as needed for anxiety.Above dose of Lorazepam  may be initiated for restlessness as needed.Atropine 1% ophthalmic drops place 2 drops under the tongue every 4 hours as needed for excessive oral secretions.  If ineffective after 2 doses, increase to 4 drops every 4 hours as needed.The remaining meds in the comfort care pack will be initiated at the discretion of the physician.27. Treat skin changes using the following protocols:      Dry Skin:Convetec II skin moisturizer daily and as needed.      Skin irritation due to fungal infection:Nystatin  powder or Extra Thick Antifungal cream topically to affected area daily and as needed.      Denuded skin:EPC/Convetec III topically to affected area daily and as needed.      Scratches and skin tears:Clean with dermal wound cleaner, pat dry apply Polysporin powder                       topically, daily and as needed.  May apply dry sterile dressing if wound is             draining.28. Treat pressure ulcers by stage using the following protocols:      Stage I: Wash area with dermal wound cleaner, gently massaging  areas, pat dry,            apply Mepilex to red, unbroken areas.      Stage II:  Clean with dermal wound cleaner, pat dry, apply  Mepilex Gentle Border           dressing every 7 days or as needed.      Stage III and Stage IV:  Require Physician contact for orders.29. Oxygen  in home 1-5 L per minute per nasal canula as needed with humidity.30. Dietary consult and nutritional counseling as needed for decreased nutritional                  intake.31. May change by mouth medications to equal dose of liquid, transdermal compound           (PLO or gel), or rectal suppository, as patient loses the ability to swallow.32. Thick-It -4 tsp per 4 oz fluid as needed for difficulty swallowing. 33. Initiate dietary supplements as needed for decreased nutritional intake.Ensure 1-3 cans daily by mouth as desired.Ensure Clear 1-3 servings daily by mouth as desired.Ensure pudding 1-3 daily by mouth as desired.Glucerna 1-3 servings daily by mouth as desired.If unable to reach me, I authorize the [facility]osp[facility] Director to prescribe for pain and symptom relief.[facility] will notify me of any changes in my patient's condition.I understand that all inpatient and outpatient medical services needs to be discussed with administrative of hospice prior to schedule of any testing or procedures.  Failure to comply may result in the patient/families receiving a bill for unauthorized services.The hospice program is designed to be an extension of the primary physician where the patient and family are a unit of care.  My consent is based on a diagnosis that the patient is suffering from a life-limiting condition with an anticipated prognosis of 6 months or less and that the program is for palliative care focusing on the alleviation of pain and other symptoms.  I understand as Primary Physician, that I will be the Physician to sign the death certificate and give permission for the removal of the body at the time of death.    --------    DME - New DME: Oxygen  concentrator    --------    DME - New DME: Nebulizer    --------    DME - New DME: Seated walker     --------    DME - New DME: Bath chair    --------    DME - New DME: Raised toilet seat    --------    INTERVENTION - Assist with spiritual resources (Freq: Selected Visits) Start: 10/04/2023      --------    INTERVENTION - Educate on standard precautions (Freq: Each Visit) Start: 10/04/2023      --------    INTERVENTION - Emergent Planning (Freq: PRN) Start: 10/04/2023      --------    INTERVENTION - Fall Prevention Education (Freq: Each Visit) Start: 10/04/2023      --------    INTERVENTION - Initiate comfort care pack (Freq: Each Visit) Start: 10/04/2023      --------    INTERVENTION - Initiative standing orders (Freq: Each Visit) Start: 10/04/2023      --------    INTERVENTION - Instruct infection prevention (Freq: Each Visit) Start: 10/04/2023      --------    INTERVENTION - Instruct on grief process and coping strategies (Freq: Each Visit) Start: 10/04/2023      --------    INTERVENTION - Instruct on  proper use of home oxygen  (Freq: Each Visit) Start: 10/04/2023      --------    INTERVENTION - Instruct on strategies to reduce injury in the home (Freq: Each Visit) Start: 10/04/2023      --------    INTERVENTION - Instruct patient/family on wound care (Freq: Each Visit) Start: 10/04/2023      --------    INTERVENTION - Instruct patient/family/caregiver on infection control and safe disposal of dressing materials (Freq: Each Visit) Start: 10/04/2023      --------    INTERVENTION - Instruct positioning (Freq: Each Visit) Start: 10/04/2023      --------    INTERVENTION - Instruct: hospice orientation (Freq: Selected Visits) Start: 10/04/2023      --------    INTERVENTION - Instruct: hospice philosophy (Freq: Each Visit) Start: 10/04/2023      --------    INTERVENTION - Instruct: medical equipment (Freq: Selected Visits) Start: 10/04/2023      --------    INTERVENTION - Instruct: medical power of attorney and will (Freq: Each Visit) Start: 10/04/2023      --------    INTERVENTION - Instruct: terminal illness (Freq: Selected Visits)  Start: 10/04/2023      --------    INTERVENTION - Observe incisions, wounds, or drain sites (Freq: Each Visit) Start: 10/04/2023      --------    INTERVENTION - Offer ongoing support/referrals to community resources (Freq: Selected Visits) Start: 10/04/2023      --------    INTERVENTION - Perform monthly arm circumerference. (Freq: Selected Visits) Start: 10/04/2023      --------    INTERVENTION - Pressure relief (Freq: Each Visit) Start: 10/04/2023      --------    INTERVENTION - Provide grief counseling/education (Freq: Each Visit) Start: 10/04/2023      --------    INTERVENTION - Provide spiritual support (Freq: Each Visit) Start: 10/04/2023      --------    INTERVENTION - Refer to grief support in community (Freq: Each Visit) Start: 10/04/2023      --------    INTERVENTION - Skilled assessment skin (Freq: Each Visit) Start: 10/04/2023      --------    INTERVENTION - Teach grief process and stages of grief (Freq: Each Visit) Start: 10/04/2023      --------    INTERVENTION - Teach nutrition (Freq: Each Visit) Start: 10/04/2023      --------    INTERVENTION - Wound care (Freq: Selected Visits) Start: 10/04/2023      --------    INTERVENTION - Wound Measurements (Freq: Each Visit) Start: 10/04/2023      --------    INTERVENTION - Wound supply (Freq: Each Visit) Start: 10/04/2023      --------    INTERVENTION - Assess patient understanding related to oxygen  misuse (Freq: Each Visit) Start: 10/04/2023      --------    INTERVENTION - Assess understanding of fire risks to patient home or neighborhood related to oxygen  (Freq: Each Visit) Start: 10/04/2023      --------    INTERVENTION - Assess understanding of risks with noncompliance with oxygen  safety (Freq: Each Visit) Start: 10/04/2023      --------    INTERVENTION - Eduate patient of harm related to oxygen  misuse (Freq: Each Visit) Start: 10/04/2023      --------    INTERVENTION - Educate on damage related to noncompliance with oxygen  (Freq: Each Visit) Start: 10/04/2023       --------    INTERVENTION - Educate patient on risk of fire/injury/death (  Freq: Each Visit) Start: 10/04/2023      --------    INTERVENTION - Assess expectations before and after death (Freq: Each Visit) Start: 10/04/2023      --------    INTERVENTION - Assess grief responses (Freq: Each Visit) Start: 10/04/2023      --------    INTERVENTION - Assess home environment (Freq: Each Visit) Start: 10/04/2023      --------    INTERVENTION - Assess home for safety and appropriateness for DME equipment. (Freq: Each Visit) Start: 10/04/2023      --------    INTERVENTION - Assess home for safety and appropriateness for DME equipment. (Freq: Each Visit) Start: 10/04/2023      --------    INTERVENTION - Assess Oxygen  Safety Practices (Freq: Each Visit) Start: 10/04/2023      --------    INTERVENTION - Assess Patient Functional Limitations (Freq: Each Visit) Start: 10/04/2023      --------    INTERVENTION - Assess Patient Safety Measures in Home (Freq: PRN) Start: 10/04/2023      --------    INTERVENTION - Assess Patient's Activity Level (Freq: PRN) Start: 10/04/2023      --------    INTERVENTION - Assess Patient's Mental Status Each Visit (Freq: Each Visit) Start: 10/04/2023      --------    INTERVENTION - Assess Patient's Prognosis (Freq: PRN) Start: 10/04/2023      --------    INTERVENTION - Assess spiritual needs (Freq: Each Visit) Start: 10/04/2023      --------    INTERVENTION - Assess the Presence of Advanced Directives (Freq: PRN) Start: 10/04/2023      --------    INTERVENTION - Assist family in saying goodbye (Freq: Each Visit) Start: 10/04/2023      --------    INTERVENTION - Assist with clergy contact (Freq: Selected Visits) Start: 10/04/2023      --------    MED - oxygen  (O2) Inhalation gas (New)    --------    MED - ipratropium-albuterol  0.5 mg-3 mg(2.5 mg base)/3 mL Solution for Nebulization (New)    --------    MED - no order text    --------    VISIT - SN visits from 10/04/2023 to 12/26/2023 (New)    --------    VISIT - MSW visits  from 10/04/2023 to 10/08/2023    --------    VISIT - Chaplain visits from 10/04/2023 to 12/26/2023

## 2023-10-08 ENCOUNTER — Ambulatory Visit (INDEPENDENT_AMBULATORY_CARE_PROVIDER_SITE_OTHER): Payer: Self-pay

## 2023-10-08 ENCOUNTER — Other Ambulatory Visit

## 2023-10-08 ENCOUNTER — Other Ambulatory Visit: Payer: Self-pay

## 2023-10-08 ENCOUNTER — Non-Acute Institutional Stay (HOSPICE_FACILITY): Payer: Self-pay

## 2023-10-08 ENCOUNTER — Encounter (HOSPICE_FACILITY): Payer: Self-pay

## 2023-10-08 LAB — PARACHUTE DME

## 2023-10-08 NOTE — Hospice Plan of Care (Addendum)
 Patient: [name]    Date: 10/08/2023  Time: 09:19    HSPC Social Worker Notes  Initial SW assessment will be completed by 10-08-23    Signed by: Carman Chimera, SOCIAL WORKER

## 2023-10-08 NOTE — Hospice Plan of Care (Addendum)
 New admission: patient and family will be monitored for anticipated grief

## 2023-10-08 NOTE — Hospice (Signed)
 Primary Diagnosis: Severe Protein-calorie malnutrition  Other Diagnosis: Heart    Functional Status: bedbound, dependent for all ADLs    Capacity: Incapacitated  Advanced Directives:   1. Code Status: DNR  2. MPOA: Josh Niemann primary. Davia Erps secondary.  3. Final Arrangements: Body donation request completd today. [facility] will be the backup plan.    Living Arrangements/Caregiving Plan:  lives with dghtr and son in law    Primary Caregiver: Donna/dghtr    Support System: Dietitian, Denny/Son in Social worker, Mark/son, Mathew/    In-home Services: none    Patient/Family goal:  comfort at home    Veteran: no     Financial: no concerns or needs reported    Spiritual: [facility] Spiritual Coordinator providing visits    Coping: Good support/positive coping. Accepting.     Background:  patient worked [employer], retired Web designer. Spouse passed away about 30 years ago. She has 2 kids: [name] and [place]. Patient had been active/independent in June 2024. She moved in with dghtr in December 2024. Currently bedbound, dependent.     Social Work Plan:   Pending Referrals/Current Needs: Body Donation request completed with patient/family today. SW will submit to [employer]. Educated on need for back up/FH choice, provided FH list. FAmily state they would likely chose [facility]. Will notify hospice if that changes.   Social Work will provide ongoing assessment and support. Visit frequencies set at 1X/month and 3 PRN.

## 2023-10-08 NOTE — Hospice Plan of Care (Addendum)
 New Admission : initial spiritual care and assessment done , will communicate with patient and family to fulfill the spiritual needs going forward.

## 2023-10-08 NOTE — Hospice Plan of Care (Addendum)
 No volunteer needs

## 2023-10-08 NOTE — Hospice (Signed)
 89yo female admitted with severe protein-calorie malnutrition. Pt is eating some pureed soup for lunch. Pt then falls asleep. Continual oxygen  at 2L/NC. Has some incontinence of bladder. Daughter go her up to ambulate to the bathroom. Pt did well with ambulation and daughter holding onto her. PPS changed to 40%. Pt feels like she can't breathe without her oxygen  but daughter reports her oxygen  saturations are always good. Lungs are clear and diminished. Pt is very hard of hearing and hard to communicate with. Encouraged daughter to call for any needs or concerns.

## 2023-10-09 ENCOUNTER — Non-Acute Institutional Stay: Payer: Self-pay

## 2023-10-10 ENCOUNTER — Non-Acute Institutional Stay (HOSPICE_FACILITY): Payer: Self-pay | Admitting: Family Medicine

## 2023-10-10 NOTE — Hospice Plan of Care (Addendum)
 Patient's diagnosis, plan of care, medications, DME, and disciplines have been discussed.  Severe protein calorie malnutrition.  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  O2 dependent.  Patient has a PPS of 30% sleeping about 12 hours out of the day.  Max assist with all ADLs.  Arm circumference 16 cm.  Patient is O2 dependent at 2 L per nasal cannula.  Is 5 ft 2 and weighs about 67 lb.  The BMI of 12.3.  Patient had a recent hospitalization for pneumonia and urinary tract infection.  Patient's O2 sats were normal upon nurses visit but seem to be short of breath.  Does have O2 at home.  And a Rollator.  Patient's appetite is poor at this time but varies she does not take any supplements.  We will try to add these into her diet.  Patient does seem to be declining at 89 years old.  I have reviewed patients diagnosis & comorbidities, following my review patient seems to be hospice appropriate at this time. Pt seems to have a poor prognosis.  Pt should have less then 6 months to live with normal process of the illness and comorbidities. The plan of care, I have reviewed and agree with the above IDG documentation

## 2023-10-12 ENCOUNTER — Other Ambulatory Visit: Payer: Self-pay

## 2023-10-12 NOTE — Hospice (Signed)
 89yo female admitted with severe protein calorie malnutrition. Pt sleeping in bed. Daughter had showered pt earlier and she is exhausted. Opens eyes, denies pain. Returns to sleep. Son is sitting with patient during visit. No needs were verbalized by daughter during earlier conversation. Encouraged son to call for any needs or concerns

## 2023-10-13 ENCOUNTER — Other Ambulatory Visit (HOSPICE_FACILITY)

## 2023-10-15 ENCOUNTER — Other Ambulatory Visit: Payer: Self-pay

## 2023-10-15 ENCOUNTER — Other Ambulatory Visit (HOSPICE_FACILITY): Payer: Self-pay

## 2023-10-15 NOTE — Hospice (Signed)
 shower no issues

## 2023-10-15 NOTE — Hospice (Signed)
 89yo female admitted with severe protein calorie malnutrition. Pt sleeping in bed upon arrival. Daughter reports she walked from the bedroom to the living room with asst. But then refuses to walk with the aide to the shower and sit on the rollator. Pt is now eating several small pureed meals a day. No BM. Giving lactulose  without any results. Abd remains soft, pt denies any pain on palpation. BS + all 4 quadrants. Pt wears oxygen  continual at 2L/NC because she feels like she can't breath. Daughter reports her oxygen  levels are good when she feels like this. Wounds on legs are healing. Daughter reports pt wants to stay in bed all the time. Encouraged daughter to call for any needs or concerns.

## 2023-10-16 ENCOUNTER — Other Ambulatory Visit (HOSPICE_FACILITY)

## 2023-10-16 NOTE — Hospice (Signed)
 Name and DOB recogntion    10/16/23  spiritual care vist to patient's home living with her daughter , [name] daughter was present , and a care giver , daughter did not want patient disturbed from sleeping , gave support

## 2023-10-19 ENCOUNTER — Other Ambulatory Visit: Payer: Self-pay

## 2023-10-19 ENCOUNTER — Ambulatory Visit: Attending: Internal Medicine

## 2023-10-19 ENCOUNTER — Non-Acute Institutional Stay (HOSPICE_FACILITY): Payer: Self-pay

## 2023-10-19 ENCOUNTER — Other Ambulatory Visit (HOSPICE_FACILITY): Payer: Self-pay

## 2023-10-19 DIAGNOSIS — R399 Unspecified symptoms and signs involving the genitourinary system: Secondary | ICD-10-CM

## 2023-10-19 LAB — URINALYSIS, MACRO/MICRO
BILIRUBIN: NEGATIVE mg/dL
BLOOD: NEGATIVE mg/dL
COLOR: NORMAL
GLUCOSE: NORMAL mg/dL
KETONES: NEGATIVE mg/dL
LEUKOCYTES: NEGATIVE WBCs/uL
NITRITE: NEGATIVE
PH: 7.5 (ref 5.0–8.0)
PROTEIN: NEGATIVE mg/dL
SPECIFIC GRAVITY: 1.008 (ref 1.005–1.030)
UROBILINOGEN: NORMAL mg/dL

## 2023-10-19 NOTE — Hospice (Signed)
 89yo female admitted with severe protein-calorie malnutrition. Daughter reports pt is very confused, talking out of her head, talked all night to people. She was taking off her depends and throwing them. Also trying to get out of bed during the night. Pt is unable to stand on her own and would fall.  Request urine sample. Dr Tonette Bihari office notified. Obtained straight cathed specimen without any difficulties. Pt tolerated it well. Pt is unable to have a lucid conversation today. Daughter reports she has been eating more soups recently, is drinking as well. Wounds on legs are healing. Encouraged to call for any needs or concerns.

## 2023-10-20 ENCOUNTER — Ambulatory Visit (HOSPITAL_BASED_OUTPATIENT_CLINIC_OR_DEPARTMENT_OTHER): Payer: Self-pay | Admitting: Internal Medicine

## 2023-10-20 ENCOUNTER — Telehealth (HOSPICE_FACILITY): Payer: Self-pay | Admitting: NURSE PRACTITIONER, FAMILY

## 2023-10-20 NOTE — Telephone Encounter (Signed)
 Returned call to daughter who called in to office regarding patient with AMS and slurred speech. Per daughter she ate breakfast ok this morning then became increasingly confused with slurred speech. UA obtained yesterday that was negative. Discussed ED evaluation vs home with PRN nurse visit/telephone call. Per daughter she does not want to send patient to ED. She verbalized patient remaining at home for comfort with hospice. Hospice nurse to call and follow up with patient today.     Shon Hough, APRN,FNP-BC

## 2023-10-21 NOTE — Hospice Plan of Care (Addendum)
 Patient: [name]    Date: 10/21/2023  Time: 21:07    [facility] Nurse Notes    72. 89 year old female  2. Admitted 10/04/23.  1st benefit period  3. Hospice Diagnosis: Severe protein calorie malnutrition  4. PPS: 30%  5. Sleeping how many hours 12-15/day  6. ADLs: 6/6  7. Arm Circumference: Decreased from 16 cm LUA to 15cm  8. MPOA/HCS/person of contact: Dorothyann Peng [phone]  9. LDWA's (Lines/Drains/Wounds/Air ways: O2   10. Upcoming appointments/diagnostic testing: None  11. Top priorities: shortness of breath  12. Safety concerns: Falls  13. Code Status: DNR  14. Changes since last IDG meeting: Mental changes  15. MPOA offered to take part in IDG meeting: Yes  16. IDG goal: Patient to be without falls over the next two weeks  17. Pt/Family goal: Patient to be without falls    Pt has become lethargic and unable to be up out of bed. Hallucinating and yelling out. Recent urinalysis negative. Frail, thin and pale. Unable to converse with daughter. Incontinent of bowel and bladder. Now unable to feed herself. Oxygen dependent.       Signed by: Thedore Mins, RN

## 2023-10-22 ENCOUNTER — Other Ambulatory Visit (HOSPICE_FACILITY): Payer: Self-pay

## 2023-10-22 ENCOUNTER — Non-Acute Institutional Stay (HOSPICE_FACILITY): Payer: Self-pay

## 2023-10-22 ENCOUNTER — Other Ambulatory Visit: Payer: Self-pay

## 2023-10-22 ENCOUNTER — Encounter (HOSPICE_FACILITY): Payer: Self-pay

## 2023-10-22 NOTE — Hospice (Signed)
 bed bath no issues

## 2023-10-22 NOTE — Hospice Plan of Care (Addendum)
 No volunteer needs

## 2023-10-22 NOTE — Hospice Plan of Care (Addendum)
 Patient: [name]    Date: 10/22/2023  Time: 09:18    [facility] Social Worker Notes  Routine Patient  Primary Diagnosis: Severe Protein-calorie malnutrition  Other Diagnosis: Heart  Functional Status: bedbound, dependent for all ADLs  Capacity: Incapacitated  Advanced Directives:   1.Code Status: DNR  2.MPOA: Dorothyann Peng primary. Leslie Andrea secondary.  3.Final Arrangements: Body donation. [facility] will be the backup plan.  Living Arrangements/Caregiving Plan:  lives with dghtr and son in law  Primary Caregiver: Donna/dghtr  Support System: Dietitian, Denny/Son in Social worker, Mark/son, Mathew/  In-home Services: none  Patient/Family goal:  comfort at home  Veteran: no   Financial: no concerns or needs reported  Spiritual: [facility] Spiritual Coordinator providing visits  Coping: Good support/positive coping. Accepting.   Background:  patient worked [employer], retired Web designer. Spouse passed away about 30 years ago. She has 2 kids: Lupita Leash and [place]. Patient had been active/independent in June 2024. She moved in with dghtr in December 2024. Currently bedbound, dependent.     Social Work Plan:   Pending Referrals/Current Needs: No current SW needs. Current care and support have been effective.    Social Work will provide ongoing assessment and support. Visit frequencies set at 1X/month and 3 PRN.    Signed by: Bonnye Fava, SOCIAL WORKER

## 2023-10-22 NOTE — Hospice Plan of Care (Addendum)
 Spiritual Needs:  separation from church , some anxiety         Interventions:  spiritual care visits         Family/Patient Goals:  support for the family         Changes (since previous IDG):  no changes         Spiritual Care Visit Frequency:  upon request of the daughter/ MPOA

## 2023-10-22 NOTE — Hospice (Signed)
 89yo female admitted with severe protein calorie malnutrition. Pt sleeping in bed upon arrival. Pt continues to eat several small pureed meals a day. No BM. Giving lactulose without any results. Abd remains soft, pt denies any pain on palpation. BS + all 4 quadrants. Daughter is going to try to give patient mag citrate, and will call if she wants an enema done. Pt wears oxygen continual at 2L/NC. Wounds on legs are healing and scabbed over. No signs of pain or shortness of breath. Encouraged daughter to call for any needs or concerns.

## 2023-10-22 NOTE — Hospice Plan of Care (Addendum)
 Primary Bereaved: [name]/MPOA  Current Risk level: low    Current coping status/concerns: no concerns. Normal grief anticipated. Ongoing assessment and support to be provided.

## 2023-10-24 ENCOUNTER — Non-Acute Institutional Stay (HOSPICE_FACILITY): Payer: Self-pay

## 2023-10-26 ENCOUNTER — Other Ambulatory Visit (HOSPICE_FACILITY): Payer: Self-pay

## 2023-10-26 ENCOUNTER — Other Ambulatory Visit: Payer: Self-pay

## 2023-10-26 MED ORDER — PANTOPRAZOLE 40 MG TABLET,DELAYED RELEASE
40.0000 mg | DELAYED_RELEASE_TABLET | Freq: Every day | ORAL | Status: AC
Start: 2023-10-26 — End: ?

## 2023-10-26 NOTE — Hospice (Signed)
 bed bath no issues

## 2023-10-26 NOTE — Hospice (Signed)
 89yo female admitted with Severe protein-calorie malnutrition. Pt resting in bed. Had her bath earlier. Daughter reports she talked all night. Pt awake and talkative. Talking about things years ago. Very HOH. Wounds healing on lower legs from previous fall. Pt without signs of pain. Has O2@2L /NC. Daughter reports she is eating every couple hours. Feeding baby food, pudding, pureed foods. Encouraged to call for any needs or concerns

## 2023-10-29 ENCOUNTER — Other Ambulatory Visit: Payer: Self-pay

## 2023-10-29 ENCOUNTER — Other Ambulatory Visit (HOSPICE_FACILITY): Payer: Self-pay

## 2023-10-29 MED ORDER — MAGNESIUM CITRATE ORAL SOLUTION
150.0000 mL | Freq: Once | ORAL | Status: AC
Start: 2023-10-29 — End: ?

## 2023-10-29 NOTE — Hospice (Signed)
 89yo female admitted with Severe protein-calorie malnutrition. Pt resting in bed. Was bathed earlier. Daughter reports Pt still has not had a BM since 4/4. BS+. Abdomen very soft, denies pain upon palpating the abdomen. Pt only eating baby food and drinking. Offered soap suds enema but daughter will try a second bottle of magnesium citrate. No results with other bottle. Daughter reports pt is passing gas. Continual oxygen at 2L/NC.  Encouraged daughter to call for any needs or concerns.

## 2023-10-29 NOTE — Hospice (Signed)
 bed bath no issues

## 2023-11-02 ENCOUNTER — Other Ambulatory Visit (HOSPICE_FACILITY)

## 2023-11-02 ENCOUNTER — Other Ambulatory Visit: Payer: Self-pay

## 2023-11-02 NOTE — Hospice Plan of Care (Addendum)
 Patient: [name]    Date: 11/02/2023  Time: 17:09    [facility] Nurse Notes    67. 89 year old female  2. Admitted 10/04/23.  1st benefit period  3. Hospice Diagnosis: Severe protein calorie malnutrition  4. PPS: 40%  5. Sleeping how many hours 12-15/day  6. ADLs: 5/6  7. Arm Circumference: Decreased from 16 cm LUA to 15cm  8. MPOA/HCS/person of contact: Josh Niemann [phone]  9. LDWA's (Lines/Drains/Wounds/Air ways: O2   10. Upcoming appointments/diagnostic testing: None  11. Top priorities: shortness of breath  12. Safety concerns: Falls  13. Code Status: DNR  14. Changes since last IDG meeting: Walking again  15. MPOA offered to take part in IDG meeting: Yes  16. IDG goal: Patient to be without falls over the next two weeks  17. Pt/Family goal: Patient to be without falls    Pt has recently been able to use her rollator to ambulate with the asst. of 1. Eating baby food and pureed foods. Has to be fed. Hands shake and unable to feed herself without spilling. Incontinent. No BM since 10/16/22. No results with lactulose , magnesium  citrate and stool softeners. Abdomen soft, no pain with palpation. BS hypoactive x 4.       Signed by: Cherise Cornelia, RN

## 2023-11-02 NOTE — Hospice (Signed)
 bed bath no issues

## 2023-11-02 NOTE — Hospice (Signed)
 Name and DOB recogntion     11/02/23  spirirtual care visit to patient's home living with her daughter [name] , patient had a nurse visit and aide prior to my visit and was very wore out , and daughter did not want her mom woke up

## 2023-11-02 NOTE — Hospice (Signed)
 89yo female admitted with Severe protein-calorie malnutrition. Pt just had her bath and is dozing. Awakens easily then returns to sleep. Very HOH and is hard to communicate with. Daughter reports pt went to stay with her son this weekend and did well. Eating pureed diet. Still no BM. Only had smearing on her pull up. BS+ Abd soft and nontender with palpation. Continues to drink fluids through out the day. Continual oxygen  at 2L/NC. Pt was able to ambulate with rollator. Pt having anxiety and talking all night. Daughter educated on trying ativan  at night from [facility]. Encouraged daughter to call for any needs or concerns

## 2023-11-03 ENCOUNTER — Non-Acute Institutional Stay (HOSPICE_FACILITY): Payer: Self-pay

## 2023-11-04 NOTE — Hospice Plan of Care (Addendum)
 Patient: [name]    Date: 11/04/2023  Time: 20:52    [facility] Social Worker Notes  Admit Date: [birth year]  Routine Patient  Last Visit: 10/08/23  Primary Diagnosis: Severe Protein-calorie malnutrition  Other Diagnosis: Heart  Functional Status: bedbound, dependent for all ADLs  Capacity: Incapacitated  Advanced Directives:   1.Code Status: DNR  2.MPOA: Josh Niemann primary. Davia Erps secondary.  3.Final Arrangements: Body donation. [facility] will be the backup plan.  Living Arrangements/Caregiving Plan:  lives with dghtr and son in law  Primary Caregiver: Donna/dghtr  Support System: Dietitian, Denny/Son in Social worker, Mark/son, Mathew/  In-home Services: none  Patient/Family goal:  comfort at home  Veteran: no   Financial: no concerns or needs reported  Spiritual: [facility] Spiritual Coordinator providing visits  Coping: Good support/positive coping. Accepting.       Social Work Plan:   Social work will continue to assess patient and family needs by making monthly and PRN visits as well as phone calls.     Signed by: Debborah Fairly, SOCIAL WORKER

## 2023-11-04 NOTE — Hospice Plan of Care (Addendum)
 Social work will continue to monitor and offer supportive services as needed.

## 2023-11-05 ENCOUNTER — Other Ambulatory Visit: Payer: Self-pay

## 2023-11-05 ENCOUNTER — Other Ambulatory Visit (HOSPICE_FACILITY): Payer: Self-pay

## 2023-11-05 ENCOUNTER — Encounter (HOSPICE_FACILITY): Payer: Self-pay

## 2023-11-05 ENCOUNTER — Non-Acute Institutional Stay (HOSPICE_FACILITY): Payer: Self-pay

## 2023-11-05 MED ORDER — LORAZEPAM 1 MG TABLET
1.0000 mg | ORAL_TABLET | Freq: Four times a day (QID) | ORAL | 5 refills | Status: DC | PRN
Start: 2023-11-05 — End: 2023-12-29
  Filled 2023-11-05: qty 56, 14d supply, fill #0

## 2023-11-05 NOTE — Hospice Plan of Care (Addendum)
 No volunteer needs

## 2023-11-05 NOTE — Hospice (Signed)
 89yo female admitted with Severe protein-calorie malnutrition. Pt just finished getting her bath. Pt without signs of pain. Pt had been having hallucinations and yelling at night. Started on Ativan  and patient has shown significant improvement. Wounds healing. Daughter voiced no concerns. Encouraged to call for any needs or concerns.

## 2023-11-05 NOTE — Hospice Plan of Care (Addendum)
 Patient's diagnosis, plan of care, medications, DME, and disciplines have been discussed.  Severe protein calorie malnutrition.  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  O2 dependent.  Patient has a PPS of 30% sleeping about 12 hours out of the day.  Max assist with all ADLs.  Arm circumference is 15 down from 16 cm.  Patient is O2 dependent at 2 L per nasal cannula.  Is 5 ft 2 and weighs about 67 lb.  The BMI of 12.3.  Patient had a recent hospitalization for pneumonia and urinary tract infection.  Patient's O2 sats were normal upon nurses visit but seem to be short of breath.  Does have O2 at home.   Patient's appetite is poor at this time she does not take any supplements.Pt is only eating baby food now and nothing else and no BM in 3 weeks. no symptoms.   We will try to add these into her diet.  Patient does seem to be declining at 89 years old.  I have reviewed patients diagnosis & comorbidities, following my review patient seems to be hospice appropriate at this time. Pt seems to have a poor prognosis.  Pt should have less then 6 months to live with normal process of the illness and comorbidities. The plan of care, I have reviewed and agree with the above IDG documentation

## 2023-11-05 NOTE — Hospice Plan of Care (Addendum)
Spiritual Needs:separation from church    Interventions: spiritual care visits    Family/Patient Goals: support for the family    Changes (since previous IDG): no changes    Spiritual Care Visit Frequency: ongoing  upon request of the patient

## 2023-11-05 NOTE — Hospice (Signed)
 bed bath no issues

## 2023-11-05 NOTE — Hospice Plan of Care (Addendum)
 10/29/2023:      MED - magnesium  citrate (CITROMA) Oral Solution (New)      10/26/2023:      INTERVENTION - Aide assist with bath (Freq: Each Visit) Start: 10/26/2023      --------    MED - pantoprazole  (PROTONIX ) 40 mg Oral Tablet, Delayed Release (E.C.) (New)

## 2023-11-05 NOTE — Hospice Plan of Care (Signed)
 Patient's diagnosis, plan of care, medications, DME, and disciplines have been discussed.  Severe protein calorie malnutrition.  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  O2 dependent.  Patient has a PPS of 30% sleeping about 12 hours out of the day.  Max assist with all ADLs.  Arm circumference 16 cm.  Patient is O2 dependent at 2 L per nasal cannula.  Is 5 ft 2 and weighs about 67 lb.  The BMI of 12.3.  Patient had a recent hospitalization for pneumonia and urinary tract infection.  Patient's O2 sats were normal upon nurses visit but seem to be short of breath.  Does have O2 at home.   Patient's appetite is poor at this time she does not take any supplements.  We will try to add these into her diet.  Patient does seem to be declining at 89 years old.  I have reviewed patients diagnosis & comorbidities, following my review patient seems to be hospice appropriate at this time. Pt seems to have a poor prognosis.  Pt should have less then 6 months to live with normal process of the illness and comorbidities. The plan of care, I have reviewed and agree with the above IDG documentation

## 2023-11-06 ENCOUNTER — Other Ambulatory Visit (HOSPICE_FACILITY)

## 2023-11-06 ENCOUNTER — Other Ambulatory Visit: Payer: Self-pay

## 2023-11-06 NOTE — Hospice (Signed)
 dropped off Lorazepam

## 2023-11-09 ENCOUNTER — Other Ambulatory Visit: Payer: Self-pay

## 2023-11-09 ENCOUNTER — Other Ambulatory Visit (HOSPICE_FACILITY): Payer: Self-pay

## 2023-11-09 NOTE — Hospice (Signed)
 bed bath no issues

## 2023-11-09 NOTE — Hospice (Signed)
 89yo female admitted with severe protein-calorie malnutrition. Pt dozing in bed. Was unable to go to her son's house this weekend because she had a fever and an ear ache. Pt just was bathed and is tired. Pt very hard of hearing and hard to communicate with her. Daughter is feeding her every couple of hours. Pureed food and baby food. Pt is passing gas and has some smears but no BM yet. Abd soft, nondistended. BS+. Has edema in one area on the left elbow. Skin intact. Encouraged to call for any needs or concerns.

## 2023-11-11 ENCOUNTER — Other Ambulatory Visit (HOSPICE_FACILITY): Attending: Family Medicine

## 2023-11-12 ENCOUNTER — Other Ambulatory Visit: Payer: Self-pay

## 2023-11-12 ENCOUNTER — Other Ambulatory Visit (HOSPICE_FACILITY): Payer: Self-pay

## 2023-11-12 NOTE — Hospice (Signed)
 89yo female admitted with severe protein-calorie malnutrition. Pt resting in bed, dozes off during visit. Had her bath and is tired. Daughter reports she said her ear was hurting earlier and used sweet oil in it. Oxygen  continual at 2L/NC. Skin warm and dry. Wounds healing. No dyspnea, no signs of pain. Very hard of hearing and hard to communicate with. Gets a little confused with answering some questions. Continues to eat baby food. No BM's. Abd remains soft, nondistended and nontender to the touch. Encouraged daughter to call for any needs or concerns.

## 2023-11-12 NOTE — Hospice (Signed)
 bed bath no issues

## 2023-11-16 ENCOUNTER — Other Ambulatory Visit: Payer: Self-pay

## 2023-11-16 ENCOUNTER — Other Ambulatory Visit (HOSPICE_FACILITY): Payer: Self-pay

## 2023-11-16 NOTE — Hospice (Signed)
 bed bath no issues

## 2023-11-16 NOTE — Hospice Plan of Care (Addendum)
 Patient: [name]    Date: 11/16/2023  Time: 15:37    [facility] Nurse Notes    101. 89 year old female  2. Admitted 10/04/23.  1st benefit period  3. Hospice Diagnosis: Severe protein calorie malnutrition  4. PPS: 40%  5. Sleeping how many hours 12-15/day  6. ADLs: 5/6  7. Arm Circumference: 14cm RUA  8. MPOA/HCS/person of contact: Josh Niemann [phone]  9. LDWA's (Lines/Drains/Wounds/Air ways: O2   10. Upcoming appointments/diagnostic testing: None  11. Top priorities: shortness of breath  12. Safety concerns: Falls  13. Code Status: DNR  14. Changes since last IDG meeting: None  15. MPOA offered to take part in IDG meeting: Yes  16. IDG goal: Patient to be out of bed more over the next two weeks  17. Pt/Family goal: Patient to be without falls    Pt has recently been able to use her rollator to ambulate with the asst. of 1. Eating baby food and pureed foods. Has to be fed at times. Asking to stay in bed all day, encouraged her to get up during the day out of the bedroom. Eats pureed foods and baby food. Has pockets of edema around and above left elbow. Arm circumference less in RUA.       Signed by: Cherise Cornelia, RN

## 2023-11-16 NOTE — Hospice (Signed)
 89yo female admitted with Severe protein-calorie malnutrition. Pt dozing in bed. Daughter reports she had a BM Saturday and Sunday. Continues to eat pureed foods and baby food. Left arm has pockets of edema around and above the elbow. Continual oxygen  at 2L/NC. Wounds healing on both legs. Pt was able to go to her own home with her son this weekend. Encouraged daughter to call for any needs or concerns.

## 2023-11-17 ENCOUNTER — Non-Acute Institutional Stay (HOSPICE_FACILITY): Payer: Self-pay

## 2023-11-18 ENCOUNTER — Other Ambulatory Visit: Payer: Self-pay

## 2023-11-19 ENCOUNTER — Other Ambulatory Visit: Payer: Self-pay

## 2023-11-19 ENCOUNTER — Other Ambulatory Visit (HOSPICE_FACILITY): Payer: Self-pay

## 2023-11-19 ENCOUNTER — Non-Acute Institutional Stay (HOSPICE_FACILITY): Payer: Self-pay | Admitting: Family Medicine

## 2023-11-19 ENCOUNTER — Encounter (HOSPICE_FACILITY): Payer: Self-pay

## 2023-11-19 ENCOUNTER — Telehealth (HOSPICE_FACILITY): Payer: Self-pay | Admitting: Family Medicine

## 2023-11-19 DIAGNOSIS — J90 Pleural effusion, not elsewhere classified: Secondary | ICD-10-CM

## 2023-11-19 NOTE — Hospice (Signed)
 bed bath no issues

## 2023-11-19 NOTE — Hospice Plan of Care (Addendum)
 Spiritual Needs:  separation from church , some anxiety    Interventions:  spiritual care visits , prayer    Family/Patient Goals:  support for the family    Changes (since previous IDG):  no changes    Spiritual Care Visit Frequency: upon request of the daughter/MPOA

## 2023-11-19 NOTE — Hospice Plan of Care (Addendum)
 No volunteer needs

## 2023-11-19 NOTE — Hospice Plan of Care (Addendum)
 Patient: [name]    Date: 11/19/2023  Time: 08:46    [facility] Social Worker Notes    Admit Date: [birth year]    Routine Patient    Last Visit: 11/18/23    Primary Diagnosis: Severe Protein-calorie malnutrition    Other Diagnosis: Heart    Functional Status: bedbound, dependent for all ADLs    Capacity: Incapacitated    Advanced Directives:   1.Code Status: DNR  2.MPOA: Josh Niemann primary. Davia Erps secondary.  3.Final Arrangements: Body donation. [facility] will be the backup plan.    Living Arrangements/Caregiving Plan:  lives with dghtr and son in law    Primary Caregiver: [name] (daughter)    Support System: Patient has a good support system.    In-home Services: none    Patient/Family goal:  comfort at home    Veteran: no     Financial: no concerns or needs reported    Spiritual: [facility] Spiritual Coordinator providing visits    Coping: Good support/positive coping. Accepting.          Social Work Plan:   Social work will continue to assess patient and family needs by making monthly and PRN visits as well as phone calls.     Signed by: Debborah Fairly, SOCIAL WORKER

## 2023-11-19 NOTE — Hospice Plan of Care (Addendum)
 Bereavement Risk Score : Unchanged since last IDG    Appropriate grief anticipated among family members

## 2023-11-19 NOTE — Hospice (Signed)
 Hospice diagnosis is severe protein-calorie malnutrition. PPS of 40%.    Upon arrival, patient is laying in bed with head elevated. Patient has just finished up eating lunch which she reports that she had soup and a flan. Patient's granddaughter in law is present. Miss [name] is hard of hearing and this nurse has to repeat the same questions several times. Patient denies any current pain or dyspnea. Oxygen  is present during visit. Miss [name] has a new skin tear to her left arm with a large bruise noted to her forearm. Both patient and family unsure of how injury occurred. Measurements taken of wound. No cell service so no picture was taken. Area is starting to scab over and is being left open to air.    No other needs reported at this time. Encouraged patient and family to call with any questions or concerns.

## 2023-11-19 NOTE — Hospice (Signed)
 A routine social work assessment completed in the home. Present was patient lying in bed along with [name] (daughter/MPOA)    Primary Hospice Diagnosis: Severe Protein Calorie Malnutrition  Other diagnosis: Heart disease    Living Arrangements/Caregiving Plan: Patient lives with [name] and her spouse. Donna's brother has been taking patient to his home on the weekends to provide [name] a break. [name] asked for a transport chair be ordered to assist with moving patient from her home to brother's home.     Patient/Family Goal:  Patient to have no pain or discomfort.     Capacity: incapacitated    Advanced Directives:  1. Code Status - DNR  2. MPOA is [name] (primary) and [name] (secondary)  3. Final arrangements human gift registry with [name] being back-up plan    Veteran: no    Financial: No needs identified.    In-home Services:  none    Religion:  Hospice spiritual coordinator makes regular visits.    Anxiety / Depression: none reported    Coping:  [name] and her brother are coping well with prognosis. [name] reported patient goes in and out of orientation; therefore unknown if she understands her prognosis.     Support System:  Patient has a good support system consisting of her children and their families.     Functional Status:  Patient is very hard of hearing; therefore difficult to discern her orientation. Patient was alert. [name] reports patient is able to make her needs known. Patient is bedbound. Patient requires assistance with all ADLs except eating. Her food has to be pureed. According to [name], patient's appetite is stable. Patient is hard of hearing and poor vision.    [name] reported no needs or concerns. Encouraged her to call hospice with any needs/concerns/questions.     Social work plan:  1. Ask back office to order a transport chair - let Beth know.  2. Social work will continue to assess patient and family needs by making monthly and PRN visits as well as phone calls.

## 2023-11-19 NOTE — Hospice Plan of Care (Addendum)
 Patient's diagnosis, plan of care, medications, DME, and disciplines have been discussed.  Severe protein calorie malnutrition.  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  O2 dependent.  Patient has a PPS of 30% sleeping about 12 hours out of the day.  Max assist with all ADLs.  Arm circumference is 15 down from 16 cm.  Patient is O2 dependent at 2 L per nasal cannula.  Is 5 ft 2 and weighs about 67 lb.  The BMI of 12.3.  Patient had a recent hospitalization for pneumonia and urinary tract infection.  Patient's O2 sats were normal upon nurses visit but seem to be short of breath.  Does have O2 at home.   Patient's appetite is poor at this time she does not take any supplements.Pt is only eating baby food now and nothing else and no BM in 3 weeks. no symptoms.   We will try to add these into her diet.  Patient does seem to be declining at 89 years old.  I have reviewed patients diagnosis & comorbidities, following my review patient seems to be hospice appropriate at this time. Pt seems to have a poor prognosis.  Pt should have less then 6 months to live with normal process of the illness and comorbidities. The plan of care, I have reviewed and agree with the above IDG documentation

## 2023-11-19 NOTE — Hospice Plan of Care (Addendum)
 Social work will continue to monitor and offer supportive services as needed.

## 2023-11-20 LAB — PARACHUTE DME

## 2023-11-23 ENCOUNTER — Other Ambulatory Visit: Payer: Self-pay

## 2023-11-23 ENCOUNTER — Other Ambulatory Visit (HOSPICE_FACILITY): Payer: Self-pay

## 2023-11-23 NOTE — Hospice (Signed)
 89yo female admitted with severe protein-calorie malnutrition. Pt in bed eating a bowl of scrambled eggs and a bowl of applesauce. Pt ate 100%. Daughter reports pt has already eaten 2 bowls of cream of wheat, a bowl of oatmeal and some fruit. She is feeding her every 2 hours because pt is asking for food. Pt is feeding herself today without difficulty. Has no signs of pain or dyspnea. Oxygen  on 2L/NC she wears continually. Wounds are measured and pictures obtained. Pt was able to go to visit with her son this weekend. Encouraged daughter to call for any needs or concerns.

## 2023-11-23 NOTE — Hospice (Signed)
 bed bath no issues

## 2023-11-26 ENCOUNTER — Other Ambulatory Visit: Payer: Self-pay

## 2023-11-26 ENCOUNTER — Other Ambulatory Visit (HOSPICE_FACILITY): Payer: Self-pay

## 2023-11-26 NOTE — Hospice (Signed)
 [name] is a 89 year old female with hospice diagnosis severe protein -calorie malnutrition . Patient lying in bde with head of bed elevated. Daughter present for visit. She reports that patients appitite continues to decrease. Patient is pale and frail in appearance. Patient denies pain or discomfort at this time. Plesantly confused and worried that we will not able to find her if she moves. Scattred bruising to left upper arm noted no open areas. Daughter denies any needs or concerns . Encouraged to call [facility] with any needs or concerns.

## 2023-11-26 NOTE — Hospice (Signed)
 bed bath no issues

## 2023-11-29 ENCOUNTER — Encounter (INDEPENDENT_AMBULATORY_CARE_PROVIDER_SITE_OTHER): Payer: Self-pay | Admitting: Family Medicine

## 2023-11-29 DIAGNOSIS — Z515 Encounter for palliative care: Secondary | ICD-10-CM | POA: Insufficient documentation

## 2023-11-29 NOTE — Progress Notes (Signed)
 Updating pt diagnosis.  Pt is under hospice care now.

## 2023-11-30 ENCOUNTER — Other Ambulatory Visit: Payer: Self-pay

## 2023-11-30 ENCOUNTER — Other Ambulatory Visit (HOSPICE_FACILITY)

## 2023-11-30 NOTE — Hospice (Signed)
 bed bathed, no needs, will continue to offer regular visits as scheduled.

## 2023-11-30 NOTE — Hospice (Signed)
 [name] is a 89 year old white female who resides at her dtrs home for 24/7 care and support. DTR reports that pt has been having smearing and constipation issues again over the weekend. Pt is alert verbal, but has confusion and forgetfulness. PPS decreasedto 30% because pt unable to stand now. DTR request a  PT evaluation. Eating baby food or pureed food every 2 - 3 hours during the day.   Delivered updated medication list. No needs or concerns at this time. Reminded to contact hospice team for any changes or support.

## 2023-11-30 NOTE — Hospice Plan of Care (Addendum)
 Patient: [name]    Date: 11/30/2023  Time: 16:07    HSPC Nurse Notes  44. 89 year old female  2. Admitted 10/04/23.  1st benefit period  3. Hospice Diagnosis: Severe protein calorie malnutrition  4. PPS: 30%  5. Sleeping how many hours 12-15/day  6. ADLs: 5/6  7. Arm Circumference: 14cm RUA  8. MPOA/HCS/person of contact: Josh Niemann [phone]  9. LDWA's (Lines/Drains/Wounds/Air ways: O2   10. Upcoming appointments/diagnostic testing: None  11. Top priorities: shortness of breath  12. Safety concerns: Falls  13. Code Status: DNR  14. Changes since last IDG meeting: Decreased PPS to 30%  15. MPOA offered to take part in IDG meeting: Yes  16. IDG goal: Patient to be out of bed more over the next two weeks  17. Pt/Family goal: Patient to be without falls    Pt unable to stand, daughter request PT consult. Eating baby food and pureed foods. Daughter reports she is asking for something to eat every 2-3 hours during the day and eats 100%. Asking to stay in bed all day, encouraged her to get up during the day out of the bedroom. Has pockets of edema around and above left elbow.       Signed by: Cherise Cornelia, RN

## 2023-12-02 ENCOUNTER — Non-Acute Institutional Stay (HOSPICE_FACILITY): Payer: Self-pay

## 2023-12-02 NOTE — Hospice Plan of Care (Addendum)
 Social work will continue to monitor and offer supportive services as needed.

## 2023-12-02 NOTE — Hospice Plan of Care (Addendum)
 No volunteer needs

## 2023-12-02 NOTE — Hospice Plan of Care (Addendum)
 Patient: TINA GRUNER    Date: 12/02/2023  Time: 21:37    [facility] Social Worker Notes    Admission Date: 10/04/23     Routine Patient     Primary Hospice Diagnosis: Severe Protein Calorie Malnutrition   Other diagnosis: Heart disease     Last Visit: 11/18/23     Primary Caregiver: Josh Niemann     Capacity Status: incapacitated     Advance Directive Status:   1. Code Status - DNR   2. MPOA is Josh Niemann   3. Final arrangements human gift registry with Nolon Baxter being back-up plan     Living Arrangements: [name] lives with [name] and her family     Current outside resources/services: none     Financial concerns: none     Current Psychosocial Concerns: none     Patient/family goals: Patient to have no pain or discomfort.     Observed changes by Child psychotherapist since last IDG: N/A     Spiritual/Volunteer status: hospice spiritual coordiantor makes regular visits.     Social work plan:   Social work will continue to assess patient and family needs by making monthly and PRN visits as well phone calls.     Signed by: Debborah Fairly, SOCIAL WORKER

## 2023-12-02 NOTE — Hospice Plan of Care (Addendum)
 11/19/2023:      INTERVENTION - Assist family in saying goodbye (Freq: PRN) Start: 10/04/2023      --------    INTERVENTION - Instruct on grief process and coping strategies (Freq: PRN) Start: 10/04/2023      --------    INTERVENTION - Provide grief counseling/education (Freq: PRN) Start: 10/04/2023      --------    INTERVENTION - Refer to grief support in community (Freq: PRN) Start: 10/04/2023      --------    INTERVENTION - Teach grief process and stages of grief (Freq: PRN) Start: 10/04/2023

## 2023-12-03 ENCOUNTER — Other Ambulatory Visit (HOSPICE_FACILITY): Payer: Self-pay | Admitting: NURSE PRACTITIONER, FAMILY

## 2023-12-03 ENCOUNTER — Other Ambulatory Visit: Payer: Self-pay

## 2023-12-03 ENCOUNTER — Encounter (HOSPICE_FACILITY): Payer: Self-pay

## 2023-12-03 DIAGNOSIS — E46 Unspecified protein-calorie malnutrition: Secondary | ICD-10-CM

## 2023-12-03 NOTE — Hospice (Signed)
 [name] is a 89 year old female with hospice diagnosis severe protein -calorie malnutrition . Patient lying in bed with head of bed elevated. Daughter present for visit. She reports that patients appetite continues to increase she is eating every 2 hours . Patient is pale and frail in appearance. Patient denies pain or discomfort at this time. Pleasantly confused and worried that we will not able to find her if she moves. Scattered bruising to left upper arm noted no open areas. Daughter denies any needs or concerns . Encouraged to call [facility] with any needs or concerns.

## 2023-12-03 NOTE — Hospice Plan of Care (Addendum)
 Spiritual Needs:  separation from church , some anxiety         Interventions:  spiritual care visits         Family/Patient Goals:  support for the family         Changes (since previous IDG):  no changes         Spiritual Care Visit Frequency:  upon request of the daughter/ MPOA

## 2023-12-04 ENCOUNTER — Non-Acute Institutional Stay (HOSPICE_FACILITY): Payer: Self-pay

## 2023-12-07 ENCOUNTER — Non-Acute Institutional Stay (HOSPICE_FACILITY): Payer: Self-pay | Admitting: Family Medicine

## 2023-12-07 ENCOUNTER — Other Ambulatory Visit (HOSPICE_FACILITY): Payer: Self-pay

## 2023-12-07 ENCOUNTER — Other Ambulatory Visit: Payer: Self-pay

## 2023-12-07 NOTE — Hospice Plan of Care (Addendum)
 Patient's diagnosis, plan of care, medications, DME, and disciplines have been discussed.  Severe protein calorie malnutrition.  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  O2 dependent.  Patient has a PPS of 30% sleeping about 12 hours out of the day.  Max assist with all ADLs.  Arm circumference is 15 Patient is O2 dependent at 2 L per nasal cannula.  Is 5 ft 2 and weighs about 67 lb.  The BMI of 12.3. Does have O2 at home.   Patient's appetite is poor at this time she does not take any supplements. Patient does seem to be declining at 89 years old.  We are going to check a albumin and pre-albumin, lab work secondary to patient's appetite is improving little bit.  I have reviewed patients diagnosis & comorbidities, following my review patient seems to be hospice appropriate at this time. Pt seems to have a poor prognosis.  Pt should have less then 6 months to live with normal process of the illness and comorbidities. The plan of care, I have reviewed and agree with the above IDG documentation

## 2023-12-08 ENCOUNTER — Other Ambulatory Visit (HOSPICE_FACILITY): Payer: Self-pay

## 2023-12-08 ENCOUNTER — Other Ambulatory Visit: Payer: Self-pay

## 2023-12-08 ENCOUNTER — Telehealth (INDEPENDENT_AMBULATORY_CARE_PROVIDER_SITE_OTHER): Payer: Self-pay | Admitting: Family Medicine

## 2023-12-08 ENCOUNTER — Other Ambulatory Visit: Attending: NURSE PRACTITIONER, FAMILY | Admitting: NURSE PRACTITIONER, FAMILY

## 2023-12-08 ENCOUNTER — Other Ambulatory Visit (INDEPENDENT_AMBULATORY_CARE_PROVIDER_SITE_OTHER): Payer: Self-pay

## 2023-12-08 DIAGNOSIS — E46 Unspecified protein-calorie malnutrition: Secondary | ICD-10-CM | POA: Insufficient documentation

## 2023-12-08 LAB — ALBUMIN: ALBUMIN: 2.5 g/dL — ABNORMAL LOW (ref 3.4–4.8)

## 2023-12-08 LAB — PREALBUMIN: PREALBUMIN: 20.3 mg/dL (ref 18.0–45.0)

## 2023-12-08 NOTE — Telephone Encounter (Signed)
 call from [name], PT with [name].   He wants to know if a steady lift can be order for pt home to aid the caregivers.     pt had PT initial eval today.   [name]: [phone]    [name], MA

## 2023-12-08 NOTE — Hospice (Signed)
 Ms. [name] is a 89 year old Female on routine  hospice services for severe protein calorie nutrition.     PPS: 30% totally bedbound, unable to do any activity due to extensive disease, total-care, normal or reduced intake, full or drowsy with +/- confusion.  FAST: Not Assessed  NYHA: Not Assessed    Orientation status: oriented to person  Pain level 0  Oxygen  use: Yes: 2 Liters. Safety reviewed with caregiver.    Patient is resting in bed upon my arrival, appears to be sleeping. Daughter [name] present for visit. assessment and vitals completed, patient is very hard of hearing- [name] assisted me to explain about drawing blood. blood drawn from left [facility] without difficulty.      Complete medication reconciliation completed, no meds needed at this time.  No needs / questions expressed at this time from caregiver; Support provided. Encouraged to contact hospice with questions or concerns.

## 2023-12-08 NOTE — Case Communication (Signed)
 Physical therapy hospice evaluation 5/27. Patient admitted to hospice due to severe malnutrition. PMH: CHF pleural effusion, COPD, COVID, vit D deficiency, hyperlipidemia, hypothyroidism, UTI, pneumonia, O2 dependence. Patient has not been able to ambulate since returning home from hospital in January. Daughter requested PT due to patient's inability to stand and caregiver burden, feels she could do better at caring for patient if she w as able to stand even briefly to assist with dressing changes. Patient lives with daughter with single level set up, requiring 4 stairs to enter/exit home, been using a makeshift ramp, getting a better one tomorrow. Upon evaluation this date, patient transfers from supine/sit with max assist and able to stand with max assistance for 10 seconds holding onto therapist but knees buckling, unable to maintain. Returned patient back to supine  with max assistance to scoot in bed. Strength grossly 4-/5 throughout, due to lack of vision and difficulty hearing, unable to formally assess. Patient is not expected to benefit from home exercise program. Recommended they get steady lift ordered. Called and spoke to Dr. Ofilia Benton nurse requesting order and requesting call back. Will check on patient/caregiver understanding of using device next week if able to obtain. Discussed purcha sing options if unable to obtain under hospice. Patient and daughter report understanding.

## 2023-12-08 NOTE — Telephone Encounter (Signed)
 you would have to ask Moira Andrews or Beth at the office of hospice

## 2023-12-08 NOTE — Home Health (Signed)
 Physical therapy hospice evaluation 5/27. Patient admitted to hospice due to severe malnutrition. PMH: CHF pleural effusion, COPD, COVID, vit D deficiency, hyperlipidemia, hypothyroidism, UTI, pneumonia, O2 dependence. Patient has not been able to ambulate since returning home from hospital in January. Daughter requested PT due to patient's inability to stand and caregiver burden, feels she could do better at caring for patient if she was able to stand even briefly to assist with dressing changes. Patient lives with daughter with single level set up, requiring 4 stairs to enter/exit home, been using a makeshift ramp, getting a better one tomorrow. Upon evaluation this date, patient transfers from supine/sit with max assist and able to stand with max assistance for 10 seconds holding onto therapist but knees buckling, unable to maintain. Returned patient back to supine with max assistance to scoot in bed. Strength grossly 4-/5 throughout, due to lack of vision and difficulty hearing, unable to formally assess. Patient is not expected to benefit from home exercise program. Recommended they get steady lift ordered. Called and spoke to Dr. Ofilia Benton nurse requesting order and requesting call back. Will check on patient/caregiver understanding of using device next week if able to obtain. Discussed purchasing options if unable to obtain under hospice. Patient and daughter report understanding.

## 2023-12-09 ENCOUNTER — Other Ambulatory Visit (HOSPICE_FACILITY)

## 2023-12-09 ENCOUNTER — Telehealth (HOSPICE_FACILITY): Payer: Self-pay

## 2023-12-09 NOTE — Nursing Note (Signed)
 Dr. Ofilia Benton office has notified hospice that, following a recent therapy consultation, steady was recommended. Hospice discussed this request with [name] from the therapy team and also reached out to the daughter, [name], leaving a message requesting a return call. Unfortunately, hospice will not cover the requested steady.

## 2023-12-10 ENCOUNTER — Other Ambulatory Visit: Payer: Self-pay

## 2023-12-10 ENCOUNTER — Other Ambulatory Visit (HOSPICE_FACILITY): Payer: Self-pay | Attending: Internal Medicine

## 2023-12-10 NOTE — Telephone Encounter (Signed)
 michelle with hospice is taking care of this.   [name], MA

## 2023-12-10 NOTE — Hospice (Signed)
 89yo female admitted with Severe protein-calorie malnutrition. Pt resting in bed, awake but drowsy. Pt very hard of hearing and hard to communicate with. All wounds are healed. Daughter reports she is eating every 2-3 hours during the day. Purees all food. Continual oxygen  at 2L/NC. Daughter reports she is talking to her deceased husband.  Encouraged to call for any needs or concerns.

## 2023-12-10 NOTE — Hospice (Signed)
 bed bath no issues

## 2023-12-14 ENCOUNTER — Other Ambulatory Visit: Payer: Self-pay

## 2023-12-14 ENCOUNTER — Other Ambulatory Visit

## 2023-12-14 NOTE — Hospice (Signed)
 89yo female admitted with Severe protein-calorie malnutrition. Pt in bed, eating 2 bowls of baby food. Ate 100%. Pt hard to communicate with d/t unable to hear well. Continual oxygen  at 2L/NC. Daughter reports she has been yelling out all night. Wants moved, repositioned, wants cookies. Pt was able to go to her son's over the weekend. Incontinent of bowel and bladder. Unable to stand. Skin warm and dry. Encouraged daughter to call for any needs or concerns

## 2023-12-14 NOTE — Case Communication (Signed)
 Physical therapy discipline discharge without visit 6/2. Called daughter to discuss therapy need. She states that since hospice will not cover steady lift, they have decided not to worry about this for the time being. She reports no further PT needs at this time but was instructed to request further services if needed in the future.

## 2023-12-14 NOTE — Hospice Plan of Care (Addendum)
 Patient: [name]    Date: 12/14/2023  Time: 18:40    HSPC Nurse Notes    37. 89 year old female  2. Admitted 10/04/23.  1st benefit period  3. Hospice Diagnosis: Severe protein calorie malnutrition  4. PPS: 30%  5. Sleeping how many hours 12-15/day  6. ADLs: 5/6  7. Arm Circumference: 14cm RUA  8. MPOA/HCS/person of contact: Josh Niemann [phone]  9. LDWA's:  O2   10. Upcoming appointments/diagnostic testing: None  11. Top priorities: shortness of breath  12. Safety concerns: Skin breakdown  13. Code Status: DNR  14. Changes since last IDG meeting: None  15. MPOA offered to take part in IDG meeting: Yes  16. IDG goal: Patient to be without skin breakdown over the next two weeks  17. Pt/Family goal: Patient to be out in the living room.     Pt bedbound, recent PT consult suggested a steady for her, family not interested. Able to feed herself and eats every 2-3 hours during the day. Eats 100% of 2 bowls of baby food or applesauce.  Albumin 2.5. Starting to be more confused, didn't know her daughter, talks to deceased people. Incontinent of bowel and bladder. Wounds healed from past fall.       Signed by: Cherise Cornelia, RN

## 2023-12-14 NOTE — Hospice (Signed)
 bed bath no issues

## 2023-12-14 NOTE — Home Health (Signed)
 Physical therapy discipline discharge without visit 6/2. Called daughter to discuss therapy need. She states that since hospice will not cover steady lift, they have decided not to worry about this for the time being. She reports no further PT needs at this time but was instructed to request further services if needed in the future.

## 2023-12-15 ENCOUNTER — Ambulatory Visit: Payer: Self-pay

## 2023-12-15 ENCOUNTER — Other Ambulatory Visit (HOSPICE_FACILITY)

## 2023-12-15 NOTE — Hospice (Signed)
 Name and DOB recognition     12/15/23  spiritual care visit to patient's home , she lives with her daughter , patient was in bed asleep , patient daughter/MPOA said her mom was tired and she did not want her awakened

## 2023-12-16 NOTE — Hospice Plan of Care (Addendum)
 Bereavement Risk Score : Unchanged since last IDG    Appropriate grief anticipated among family members

## 2023-12-16 NOTE — Hospice Plan of Care (Addendum)
 Social work will continue to monitor and offer supportive services as needed.

## 2023-12-16 NOTE — Hospice Plan of Care (Addendum)
 Patient: [name]    Date: 12/16/2023  Time: 17:02    [facility] Social Worker Notes    Admission Date: 10/04/23      Routine Patient      Primary Hospice Diagnosis: Severe Protein Calorie Malnutrition    Other diagnosis: Heart disease      Last Visit: 11/18/23      Primary Caregiver: Josh Niemann      Capacity Status: incapacitated      Advance Directive Status:    1. Code Status - DNR    2. MPOA is Josh Niemann    3. Final arrangements human gift registry with Nolon Baxter being back-up plan      Living Arrangements: [name] lives with [name] and her family      Current outside resources/services: none      Financial concerns: none      Current Psychosocial Concerns: none      Patient/family goals: Patient to have no pain or discomfort.      Observed changes by Child psychotherapist since last IDG: N/A      Spiritual/Volunteer status: hospice spiritual coordiantor makes regular visits.      Social work plan:    Social work will continue to assess patient and family needs by making monthly and PRN visits as well phone calls.     Signed by: Debborah Fairly, SOCIAL WORKER

## 2023-12-16 NOTE — Hospice Plan of Care (Addendum)
 Spiritual Needs:  separation from church , some anxiety    Interventions:  spiritual care visits , prayer    Family/Patient Goals:  support for the family    Changes (since previous IDG):  no changes    Spiritual Care Visit Frequency: upon request of the daughter/MPOA

## 2023-12-17 ENCOUNTER — Other Ambulatory Visit: Payer: Self-pay

## 2023-12-17 ENCOUNTER — Non-Acute Institutional Stay (HOSPICE_FACILITY): Payer: Self-pay

## 2023-12-17 ENCOUNTER — Encounter (HOSPICE_FACILITY): Payer: Self-pay

## 2023-12-17 NOTE — Hospice Plan of Care (Addendum)
 No volunteer needs

## 2023-12-17 NOTE — Hospice Plan of Care (Addendum)
 Patient's diagnosis, plan of care, medications, DME, and disciplines have been discussed.  Severe protein calorie malnutrition.  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  O2 dependent.  Patient has a PPS of 30% sleeping about 12 hours out of the day.  Max assist with all ADLs.  Arm circumference has decrease a little over last month.   Patient is O2 dependent at 2 L per nasal cannula.  Is 5 ft 2 and weighs about 67 lb.  The BMI of 12.3. Does have O2 at home.   Patient's appetite is poor at this time she does not take any supplements. Patient does seem to be declining at 89 years old. Pt is getting more confused and doesn't recognize her daughter now. And tries to eat every 2 hours while up. Sleeping about 12 hours a day.   I have reviewed patients diagnosis & comorbidities, following my review patient seems to be hospice appropriate at this time. Pt seems to have a poor prognosis.  Pt should have less then 6 months to live with normal process of the illness and comorbidities. The plan of care, I have reviewed and agree with the above IDG documentation

## 2023-12-17 NOTE — Hospice (Signed)
 [name] is a 89 year old female with hospice diagnosis severe protein -calorie malnutrition . Patient lying in bed with head of bed elevated. Daughter present for visit. She reports that patients appetite continues to increase she is eating every 2 hoursand wanting more junk foods like cookies.  Patient is pale and frail in appearance. Patient denies pain or discomfort at this time. Pleasantly confused and coopertive with focused assessment. Scattered bruising to left upper arm noted no open areas. Daughter denies any needs or concerns . Encouraged to call [facility] with any needs or concerns.

## 2023-12-18 ENCOUNTER — Non-Acute Institutional Stay (HOSPICE_FACILITY): Payer: Self-pay

## 2023-12-21 ENCOUNTER — Other Ambulatory Visit: Payer: Self-pay

## 2023-12-21 ENCOUNTER — Other Ambulatory Visit (HOSPICE_FACILITY): Payer: Self-pay

## 2023-12-21 NOTE — Hospice Plan of Care (Addendum)
 12/21/2023:      INTERVENTION - Perform monthly arm circumerference. (Freq: Selected Visits) Start: 10/04/2023        12/08/2023:      INTERVENTION - Assist patient in obtaining equipment (Freq: Each Visit) Start: 12/08/2023      --------    OTHER - Patient would benefit from Steady lift due to inability to stand without max assistance and caregiver burden. This would help caregivers assist with dressing/bathing activities and may help patient participate in standing activities to improve quality of life.    --------    VISIT - PT visits from 12/08/2023 to 12/19/2023

## 2023-12-21 NOTE — Hospice Plan of Care (Addendum)
 The hospice plan of care has been discussed with the patient representative.    Plan of care level of understanding is high.    Plan of care level of involvement is high.    Plan of care level of agreement is high.

## 2023-12-21 NOTE — Hospice Plan of Care (Addendum)
 Patient: [name]    Date: 12/21/2023  Time: 18:21    HSPC Nurse Notes    71. 89 year old female  2. Admitted 10/04/23.  2nd benefit period  3. Hospice Diagnosis: Severe protein calorie malnutrition  4. PPS: 30%  5. Sleeping how many hours 15-18/day  6. ADLs: 5/6  7. Arm Circumference: 14cm RUA  8. MPOA/HCS/person of contact: Josh Niemann [phone]  9. LDWA's: O2   10. Upcoming appointments/diagnostic testing: None  11. Top priorities: shortness of breath  12. Safety concerns: Skin breakdown  13. Code Status: DNR  14. Changes since last IDG meeting: None  15. MPOA offered to take part in IDG meeting: Yes  16. IDG goal: Patient to be without skin breakdown over the next two weeks  17. Pt/Family goal: Patient to be out in the living room.     Pt bedbound, dependent of 5/6 ADL's.  Able to feed herself and eats every 2-3 hours during the day. Eats 100% of 2 bowls of baby food or applesauce.  Albumin 2.5. Starting to be more confused, didn't know her daughter, talks to deceased people. Incontinent of bowel and bladder. Very hard of hearing and difficult to communicate with.       Signed by: Cherise Cornelia, RN

## 2023-12-21 NOTE — Hospice (Signed)
 bed bath no issues

## 2023-12-21 NOTE — Hospice (Signed)
 89yo female admitted with severe protein-calorie malnutrition. Pt awake and talking. Confused in conversation. Very hard of hearing and hard to communicate with. No signs of pain.  Has a new skin tear on right arm around the elbow area. Entire arm is purple. No pain noted with assessment of the arm. Continual oxygen  at 2L/NC. Continues to be incontinent of bowel and bladder. Eats pureed food or baby food every 2-3 hours. Daughter at residence. Encouraged to call for any needs or concerns    LCD Appendix   For Determining Prognosis   Non-Disease Specific   Patients will be considered to have a life expectancy of six months or less if there is documented evidence of decline in clinical status based on the guidelines listed below. Since determination of decline presumes assessment of the patient's status over time, it is essential that both baseline and follow-up determinations be reported where appropriate. Baseline data may be established on admission to hospice or by using existing information from records. Other clinical variables not on this list may support a six-month or less life expectancy. These should be documented in the clinical record.   These changes in clinical variables apply to patients whose decline is not considered to be reversible. They are listed in order of their likelihood to predict poor survival, the most predictive first and the least predictive last. No specific number of variables must be met, but fewer of those listed first (more predictive) and more of those listed last (least predictive) would be expected to predict longevity of six months or less.   Part I. Decline in clinical status guidelines   A. yes. Progression of disease as documented by worsening clinical status, symptoms, signs and laboratory results.   Clinical Status:   Recurrent or intractable infections such as pneumonia, sepsis or pyelonephritis.   Progressive inanition as documented by:   Weight loss of at least 10% body  weight in the prior six months, not due to reversible causes such as depression or use of diuretics.   Decreasing anthropomorphic measurements (mid-arm circumference, abdominal girth), not due to reversible causes such as depression or use of diuretics.   Observation of ill-fitting clothes, decrease in skin turgor, increasing skin folds or other observation of weight loss in a patient without documented weight.   Decreasing serum albumin or cholesterol.   Symptoms:   Dyspnea with increasing respiratory rate and Cough, intractable   Signs:   Pleural / pericardial effusion and Weakness   Laboratory: Albumin decreased at 2.5  Increasing pCO2 or decreasing pO2 or decreasing SaO2   B. yes. Decline in Karnofsky Performance Status (KPS) or Palliative Performance Score (PPS) due to progression of disease.   C. no. Progressive decline in Functional Assessment Staging (FAST) for dementia (from ?7A on the FAST)   D. yes. Progression to dependence on assistance with additional activities of daily living (See Part II, Section 2)   E. no. Progressive stage 3-4 pressure ulcers in spite of optimal care   F. yes. History of increasing emergency room visits, hospitalizations, or physician's visits related to hospice primary diagnosis prior to election of the hospice benefit.   Part Il. Non-disease specific baseline guidelines (both A and B should be met)   A. Physiologic impairment of functional status as demonstrated by: Karnofsky Performance Status (KPS) or Palliative Performance Score (PPS) < 70%. Note that two of the disease specific guidelines (HIV Disease, Stroke and Coma) establish a lower qualifying KPS or PPS.   Check level:30% totally bedbound,  unable to do any activity due to extensive disease, total-care, normal or reduced intake, full or drowsy with +/- confusion.   B. Dependence on assistance for two or more activities of daily living (ADLs)   Check activities in which patient is dependent: ambulation, continence,  transfer, dressing, feeding and bathing.   C. Co-morbidities - although not the primary hospice diagnosis, the presence of disease such as the following, the severity of which is likely to contribute to a life expectancy of six months or less, should be considered in determining hospice eligibility.   Chronic obstructive pulmonary disease and Congestive heart failure

## 2023-12-22 NOTE — Hospice Plan of Care (Addendum)
 Patient: [name]    Date: 12/22/2023  Time: 22:13    [facility]SPC Social Worker Notes    Admission Date: 10/04/23      Routine Patient      Primary Hospice Diagnosis: Severe Protein Calorie Malnutrition    Other diagnosis: Heart disease      Last Visit: 11/18/23      Primary Caregiver: Josh Niemann      Capacity Status: incapacitated      Advance Directive Status:    1. Code Status - DNR    2. MPOA is Josh Niemann    3. Final arrangements human gift registry with Nolon Baxter being back-up plan      Living Arrangements: [name] lives with [name] and her family      Current outside resources/services: none      Financial concerns: none      Current Psychosocial Concerns: none      Patient/family goals: Patient to have no pain or discomfort.      Observed changes by Child psychotherapist since last IDG: N/A      Spiritual/Volunteer status: hospice spiritual coordiantor makes regular visits.      Social work plan:    Social work will continue to assess patient and family needs by making monthly and PRN visits as well phone calls.     Signed by: Debborah Fairly, SOCIAL WORKER

## 2023-12-22 NOTE — Hospice Plan of Care (Addendum)
 Social work will continue to monitor and offer supportive services as needed.

## 2023-12-23 ENCOUNTER — Other Ambulatory Visit (HOSPICE_FACILITY)

## 2023-12-24 ENCOUNTER — Other Ambulatory Visit: Payer: Self-pay

## 2023-12-24 ENCOUNTER — Other Ambulatory Visit (HOSPICE_FACILITY): Payer: Self-pay

## 2023-12-24 NOTE — Hospice (Signed)
 bed bathed, no needs, will continue to offer regular visits as scheduled,

## 2023-12-24 NOTE — Hospice (Signed)
 89yo female admitted with Severe protein-calorie malnutrition. Pt dozing in bed, awakens easily. Daughter reports pt confused, wants to get dressed and go home. Pt doesn't always recognize daughter. Continues to eat baby food or pureed food without difficulty. Continual oxygen  at 2L/NC. Skin tear on right arm healing well. Incontinent of bowel and bladder. Dependent of 5/6 ADL's. Pt can feed herself. Unable to ambulate. Encouraged daughter to call for any needs or concerns

## 2023-12-27 ENCOUNTER — Non-Acute Institutional Stay (HOSPICE_FACILITY): Payer: Self-pay | Admitting: Family Medicine

## 2023-12-27 NOTE — Hospice Plan of Care (Addendum)
 Patient's diagnosis, plan of care, medications, DME, and disciplines have been discussed.  Severe protein calorie malnutrition.  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  O2 dependent.  Patient has a PPS of 30% sleeping about 12 hours out of the day.  Max assist with all ADLs.  Arm circumference has decrease a little over last month.   Patient is O2 dependent at 2 L per nasal cannula.  Is 5 ft 2 and weighs about 67 lb.  The BMI of 12.3. Does have O2 at home.   Patient's appetite is poor at this time she does not take any supplements. Patient does seem to be declining at 89 years old. Pt is getting more confused and doesn't recognize her daughter now. And tries to eat every 2 hours while up. Sleeping about 12 hours a day.   I have reviewed patients diagnosis & comorbidities, following my review patient seems to be hospice appropriate at this time. Pt seems to have a poor prognosis.  Pt should have less then 6 months to live with normal process of the illness and comorbidities. The plan of care, I have reviewed and agree with the above IDG documentation

## 2023-12-28 ENCOUNTER — Other Ambulatory Visit: Payer: Self-pay

## 2023-12-28 ENCOUNTER — Other Ambulatory Visit (HOSPICE_FACILITY): Payer: Self-pay

## 2023-12-28 NOTE — Hospice (Signed)
 bed bath no issues

## 2023-12-28 NOTE — Hospice (Signed)
 89yo female with Severe protein-calorie malnutrition. Pt dozing in bed. Just had her bath by aide. Pt without signs of pain. Continues to eat every 2-3 hours pureed food. Skin warm and dry. No wounds. Daughter reports no needs. Continual oxygen  at 2L/NC. No dyspnea noted at rest. Continues to be dependent of all ADL's except for feeding. Encouraged daughter to call for any needs or concerns.

## 2023-12-29 ENCOUNTER — Other Ambulatory Visit (HOSPICE_FACILITY): Payer: Self-pay | Admitting: NURSE PRACTITIONER, FAMILY

## 2023-12-29 ENCOUNTER — Non-Acute Institutional Stay (HOSPICE_FACILITY): Payer: Self-pay

## 2023-12-30 ENCOUNTER — Other Ambulatory Visit

## 2023-12-30 ENCOUNTER — Non-Acute Institutional Stay (HOSPICE_FACILITY): Payer: Self-pay

## 2023-12-30 ENCOUNTER — Other Ambulatory Visit: Payer: Self-pay

## 2023-12-30 MED ORDER — LORAZEPAM 1 MG TABLET
1.0000 mg | ORAL_TABLET | Freq: Four times a day (QID) | ORAL | 0 refills | Status: AC | PRN
Start: 2023-12-30 — End: 2024-01-14
  Filled 2023-12-30: qty 56, 14d supply, fill #0

## 2023-12-30 NOTE — Hospice Plan of Care (Unsigned)
 Patient: [name]    Date: 12/30/2023  Time: 20:46    [facility] Social Worker Notes    Admission Date: 10/04/23     Routine Patient     Primary Hospice Diagnosis: Severe Protein Calorie Malnutrition   Other diagnosis: Heart disease     Phone Call: 12/23/23     Caregiver: Josh Niemann     Capacity Status: incapacitated     Advance Directive Status:   1. Code Status - DNR   2. MPOA is Josh Niemann   3. Final arrangements human gift registry with Nolon Baxter being back-up plan     Living Arrangements: [name] lives with [name] and her family     Current outside resources/services: none     Financial concerns: none     Current Psychosocial Concerns: none     Patient/family goals: Patient to have no pain or discomfort.     Observed changes by Child psychotherapist since last IDG: N/A     Spiritual/Volunteer status: hospice spiritual coordiantor makes regular visits.     Social work plan:   Social work will continue to assess patient and family needs by making monthly and PRN visits as well phone calls.     Signed by: Debborah Fairly, SOCIAL WORKER

## 2023-12-30 NOTE — Hospice Plan of Care (Unsigned)
 Social work will continue to monitor and offer supportive services as needed.

## 2023-12-30 NOTE — Hospice Plan of Care (Unsigned)
 Patient: [name]    Date: 12/30/2023  Time: 12:34    [facility] Nurse Notes    23. 89 year old female  2. Admitted 10/04/23.  2nd benefit period  3. Hospice Diagnosis: Severe protein calorie malnutrition  4. PPS: 30%  5. Sleeping how many hours 15-18/day  6. ADLs: 5/6  7. Arm Circumference: 14cm RUA  8. MPOA/HCS/person of contact: Josh Niemann [phone]  9. LDWA's: O2   10. Upcoming appointments/diagnostic testing: None  11. Top priorities: shortness of breath  12. Safety concerns: Skin breakdown  13. Code Status: DNR  14. Changes since last IDG meeting: None  15. MPOA offered to take part in IDG meeting: Yes  16. IDG goal: Patient to continue with current appetite over the next two weeks  17. Pt/Family goal: Patient to be comfortable     [name] is bedbound, dependent of 5/6 ADL's.  Able to feed herself and eats every 2-3 hours during the day. Eats 100% of 2 bowls of baby food or applesauce. Albumin 2.5. Starting to be more confused, didn't know her daughter, talks to deceased people. Incontinent of bowel and bladder. Very hard of hearing and difficult to communicate with. She wears continual oxygen  at 2L/NC.      Signed by: Cherise Cornelia, RN

## 2023-12-30 NOTE — Hospice (Signed)
 bed bath no issues

## 2023-12-31 ENCOUNTER — Other Ambulatory Visit: Payer: Self-pay

## 2023-12-31 ENCOUNTER — Encounter (HOSPICE_FACILITY): Payer: Self-pay

## 2023-12-31 ENCOUNTER — Non-Acute Institutional Stay (HOSPICE_FACILITY): Payer: Self-pay

## 2023-12-31 NOTE — Hospice Plan of Care (Signed)
 Spiritual Needs: separation from church , some anxiety    Interventions: spiritual care visits    Family/Patient Goals:  support for the family    Changes (since previous IDG):  no changes    Spiritual Care Visit Frequency: upon request of the daughter

## 2023-12-31 NOTE — Hospice Plan of Care (Signed)
 Patient's diagnosis, plan of care, medications, DME, disciplines and person of contact/MPOA has been discussed with Dr. Curlee Doss Hospice Associate Medical Director during this IDT meeting         IDG DISCUSSION:  physical therapy discontinued on 12/14/2023 due to meeting goals.          (The following discussion is being transcribed during current IDT meeting and includes all required disciplines.)

## 2023-12-31 NOTE — Hospice (Signed)
 89yo female admitted with severe protein-calorie malnutrition. Pt sleeping in bed. Awakens easily. Daughter reports she was up on and off all night. She had ativan  before bed. Pt confused and doesn't know her daughter or where she is. No signs of pain. Continual oxygen  at 2L/NC. Continues with pureed diet. Skin warm and dry. Delivered 56 ativan  1mg  tablets. Encouraged daughter to call for any needs or concerns

## 2023-12-31 NOTE — Hospice Plan of Care (Signed)
 No volunteer needs

## 2023-12-31 NOTE — Hospice Plan of Care (Signed)
 12/21/2023:      INTERVENTION - Perform monthly arm circumerference. (Freq: Selected Visits) Start: 10/04/2023      --------    VISIT - SN visits from 10/04/2023 to 03/31/2024 (Changed)    --------    VISIT - HHA visits from 10/15/2023 to 03/31/2024 (Changed)    --------    VISIT - Spiritual visits from 10/04/2023 to 03/31/2024 (Changed)

## 2023-12-31 NOTE — Hospice Plan of Care (Signed)
 Patient's diagnosis, plan of care, medications, DME, and disciplines have been discussed.  Severe protein calorie malnutrition.  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  O2 dependent.  Patient has a PPS of 30% sleeping about 12 hours out of the day.  Max assist with all ADLs.  Arm circumference has decrease a little over last month.   Patient is O2 dependent at 2 L per nasal cannula.  Is 5 ft 2 and weighs about 67 lb.  The BMI of 12.3. Does have O2 at home.   Patient's appetite is poor at this time she does not take any supplements. Patient does seem to be declining at 89 years old. Pt is getting more confused and doesn't recognize her daughter now. And tries to eat every 2 hours while up. Sleeping about 12 hours a day.   I have reviewed patients diagnosis & comorbidities, following my review patient seems to be hospice appropriate at this time. Pt seems to have a poor prognosis.  Pt should have less then 6 months to live with normal process of the illness and comorbidities. The plan of care, I have reviewed and agree with the above IDG documentation    Pt seems to be declining per the nursing staff will continue to monitor.

## 2024-01-01 ENCOUNTER — Other Ambulatory Visit (HOSPICE_FACILITY)

## 2024-01-01 NOTE — Hospice (Signed)
 Name and DOB recogntion 01/01/24  spiritual care visit to patient who lives with daughter/MPOA , patient was sleeping daughter did not want patient woke up , gave support

## 2024-01-02 ENCOUNTER — Encounter (HOSPICE_FACILITY): Payer: Self-pay

## 2024-01-04 ENCOUNTER — Other Ambulatory Visit: Payer: Self-pay

## 2024-01-04 ENCOUNTER — Other Ambulatory Visit (HOSPICE_FACILITY): Payer: Self-pay

## 2024-01-04 NOTE — Hospice (Signed)
 89yo female  admitted with Severe protein-calorie malnutrition. Pt resting in bed. Daughter reports she doesn't know who she is, doesn't realize where she's at. Pt thinks she's in a nursing home. Pt without signs of pain. No wounds. Skin warm and dry. Continues to eat pureed food and baby food. Can feed herself but otherwise dependent of ADL's. Incontinent of bowel and bladder. Encouraged to call for any needs or concerns

## 2024-01-04 NOTE — Hospice (Signed)
 bed bath no issues

## 2024-01-07 ENCOUNTER — Other Ambulatory Visit: Payer: Self-pay

## 2024-01-07 ENCOUNTER — Non-Acute Institutional Stay (HOSPICE_FACILITY): Payer: Self-pay

## 2024-01-07 ENCOUNTER — Other Ambulatory Visit (HOSPICE_FACILITY): Payer: Self-pay

## 2024-01-07 NOTE — Hospice (Signed)
 bed bath no issues

## 2024-01-07 NOTE — Hospice (Signed)
 89 year old female admitted with severe protein-calorie malnutrition. Patient observed resting in bed. Daughter reports the patient is confused, does not recognize her, and believes she is in a nursing home. No signs of pain noted. Skin is warm and dry with no wounds present. Patient continues to eat pureed and baby food and is able to feed herself, but remains dependent for all other activities of daily living. She is incontinent of both bowel and bladder. No concerns reported. Daughter encouraged to call with any needs or concerns.

## 2024-01-11 ENCOUNTER — Other Ambulatory Visit: Payer: Self-pay

## 2024-01-11 ENCOUNTER — Other Ambulatory Visit (HOSPICE_FACILITY): Attending: Internal Medicine

## 2024-01-11 NOTE — Hospice (Signed)
 89yo female admitted with severe protein-calorie malnutrition. Pt very drowsy today, had bath earlier. No signs of pain. No dyspnea at rest. Daughter reports pt's appetite has not been as good. Not eating as much and as often. No wounds noted, skin warm and dry. Daughter reports pt is talking and seeing her deceased husband. Mental status has declined. Pt's decline discussed with daughter. Pt is incontinent of bowel and bladder. Dependent of all ADL's except she can feed herself. Encouraged daughter to call for nay needs or concerns.

## 2024-01-11 NOTE — Hospice (Signed)
 Name and DOB confirmed by daughteer/MPOA       01/11/24  spiritual care visit to patient who lives with her daughter , patient was asleep and daughter said she did not want her woke up , gave daughter support

## 2024-01-11 NOTE — Hospice (Signed)
 bed bath no issues

## 2024-01-12 ENCOUNTER — Non-Acute Institutional Stay (HOSPICE_FACILITY): Payer: Self-pay

## 2024-01-13 MED ORDER — LORAZEPAM 1 MG TABLET
1.0000 mg | ORAL_TABLET | Freq: Four times a day (QID) | ORAL | Status: DC | PRN
Start: 2024-01-13 — End: 2024-03-17

## 2024-01-13 NOTE — Hospice Plan of Care (Addendum)
 Patient: [name]    Date: 01/13/2024  Time: 21:43    [facility] Nurse Notes    49. 89 year old female  2. Admitted 10/04/23.  2nd benefit period  3. Hospice Diagnosis: Severe protein calorie malnutrition  4. PPS: 30%  5. Sleeping how many hours 20/24  6. ADLs: 5/6  7. Arm Circumference: 14cm RUA  8. MPOA/HCS/person of contact: Arland Specking [phone]  9. LDWA's: O2   10. Upcoming appointments/diagnostic testing: None  11. Top priorities: shortness of breath  12. Safety concerns: Skin breakdown  13. Code Status: DNR  14. Changes since last IDG meeting: None  15. MPOA offered to take part in IDG meeting: Yes  16. IDG goal: Patient to be without skin breakdown over the next two weeks  17. Pt/Family goal: Patient to be comfortable     [name] is bedbound, dependent of 5/6 ADL's. Appetite declining. Hallucinations increasing.   Incontinent of bowel and bladder. Very hard of hearing and difficult to communicate with. She wears continual oxygen  at 2L/NC.       Signed by: Jeneen [name], RN

## 2024-01-13 NOTE — Hospice Plan of Care (Addendum)
 Spiritual Needs:  separation from church    Interventions: spiritual care visits , prayer    Family/Patient Goals:  support for the patient    Changes (since previous IDG):  no changes    Spiritual Care Visit Frequency: upon request of the daughter/MPOA

## 2024-01-13 NOTE — Hospice Plan of Care (Addendum)
 Patient: [name]    Date: 01/13/2024  Time: 20:31    [facility] Social Worker Notes    Admission Date: 10/04/23     Routine Patient     Primary Hospice Diagnosis: Severe Protein Calorie Malnutrition   Other diagnosis: Heart disease     Last Visit: 11/18/23 and phone call on 12/23/23    Primary Caregiver: Arland Specking     Capacity Status: incapacitated     Advance Directive Status:   1. Code Status - DNR   2. MPOA is Arland Specking   3. Final arrangements human gift registry with Nichol[employer] being back-up plan     Living Arrangements: Arland lives with Arland and her family     Current outside resources/services: none     Financial concerns: none     Current Psychosocial Concerns: none     Patient/family goals: Patient to have no pain or discomfort.     Observed changes by Child psychotherapist since last IDG: N/A     Spiritual/Volunteer status: hospice spiritual coordiantor makes regular visits.     Social work plan:   Social work will continue to assess patient and family needs by making monthly and PRN visits as well phone calls.     Signed by: Suzen Abu, SOCIAL WORKER

## 2024-01-13 NOTE — Hospice Plan of Care (Addendum)
 Bereavement Risk Score : Unchanged since last IDG    Appropriate grief anticipated among family members

## 2024-01-13 NOTE — Hospice Plan of Care (Addendum)
 Social work will continue to monitor and offer supportive services as needed.

## 2024-01-14 ENCOUNTER — Other Ambulatory Visit: Payer: Self-pay

## 2024-01-14 ENCOUNTER — Other Ambulatory Visit (HOSPICE_FACILITY): Payer: Self-pay

## 2024-01-14 ENCOUNTER — Non-Acute Institutional Stay (HOSPICE_FACILITY): Payer: Self-pay

## 2024-01-14 ENCOUNTER — Encounter (HOSPICE_FACILITY): Payer: Self-pay

## 2024-01-14 NOTE — Hospice (Signed)
 [name] is a 89 year old female with hospice diagnosis of severe protein-calorie malnutrition. Patient lying gin bed with eyes closed. She does not awaken for focused assessment. Daughter present for visit and reports that patients appetite continues to decline. Patient was unable ot finish 50 % of her normal breakfast this morning. Daughter reports that patient has had edema to lower extremites and she is keeping them elevated on pillows. No edema present at this time. She denies any needs or concerns. Encouraged to call hosice with any needs or concerns.

## 2024-01-14 NOTE — Hospice (Signed)
 bed bath no issues

## 2024-01-14 NOTE — Hospice Plan of Care (Addendum)
 01/11/2024:      VISIT - MSW visits from 10/04/2023 to 03/13/2024 (Changed)

## 2024-01-14 NOTE — Hospice Plan of Care (Addendum)
 No volunteer needs

## 2024-01-14 NOTE — Hospice Plan of Care (Addendum)
 Patient's diagnosis, plan of care, medications, DME, and disciplines have been discussed.  Severe protein calorie malnutrition.  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  O2 dependent.  Patient has a PPS of 30% sleeping about 12 hours out of the day.  Max assist with all ADLs.  Arm circumference has decrease a little over last month.   Patient is O2 dependent at 2 L per nasal cannula.  Is 5 ft 2 and weighs about 67 lb.  The BMI of 12.3. Does have O2 at home.   Patient does seem to be declining at 89 years old. Pt is getting more confused and doesn't recognize her daughter  Sleeping about 12 hours a day.   I have reviewed patients diagnosis & comorbidities, following my review patient seems to be hospice appropriate at this time. Pt seems to have a poor prognosis.  Pt should have less then 6 months to live with normal process of the illness and comorbidities. The plan of care, I have reviewed and agree with the above IDG documentation   this week pt appetite has decreased which is a change and Wears O2 at 2 liters now.

## 2024-01-18 ENCOUNTER — Other Ambulatory Visit: Payer: Self-pay

## 2024-01-18 ENCOUNTER — Other Ambulatory Visit (HOSPICE_FACILITY): Payer: Self-pay

## 2024-01-18 NOTE — Hospice (Signed)
 bed bath no issues

## 2024-01-18 NOTE — Hospice (Signed)
 89yo female admitted with Severe protein - calorie malnutrition. Pt resting in bed. No signs of pain. Pt has 2 new skin tears on left leg. Appetite fair and varies. No change in arm circumference. Skin warm and dry. Continual oxygen  at 2L/NC. Encouraged daughter to call for any needs or concerns.

## 2024-01-21 ENCOUNTER — Other Ambulatory Visit (HOSPICE_FACILITY): Payer: Self-pay

## 2024-01-21 ENCOUNTER — Other Ambulatory Visit: Payer: Self-pay

## 2024-01-21 MED ORDER — BACITRACIN 500 UNIT/GRAM EYE OINTMENT
1.0000 [IU] | TOPICAL_OINTMENT | Freq: Every day | OPHTHALMIC | Status: DC | PRN
Start: 2024-01-21 — End: 2024-01-21

## 2024-01-21 MED ORDER — BACITRACIN ZINC 500 UNIT/GRAM TOPICAL OINTMENT
1.0000 [IU] | TOPICAL_OINTMENT | Freq: Three times a day (TID) | CUTANEOUS | Status: AC | PRN
Start: 2024-01-21 — End: ?

## 2024-01-21 NOTE — Hospice (Signed)
 bed bath no issues

## 2024-01-21 NOTE — Hospice (Signed)
 Welcomed into home by daughter Arland, great granddaughters present. Patient resting comfortably in bed with eyes closed wearing 2L NC O2. Pt HOH. Pt in no apparent pain. Lung sounds clear diminished,HR 80, RR 18, BS active, incontinent bowel and bladder, wears briefs, last BM Friday 01/15/24, takes senokot. Pt eats 4 small meals of pureed foods. Skin pale fragile scattered bruising on upper and lower extremities. Pt bedbound, last walked on January at hospital.  Small skin tears closed to left leg, daughter applies bacitracin  2 to 3 times daily. Reviewed medications. Encouraged to call with any needs or concerns.

## 2024-01-25 ENCOUNTER — Other Ambulatory Visit (HOSPICE_FACILITY): Payer: Self-pay

## 2024-01-25 ENCOUNTER — Other Ambulatory Visit: Payer: Self-pay

## 2024-01-25 NOTE — Hospice (Signed)
 bed bath no issues, she has a skin tear on her left arm took a picture it in  epic and notified Museum/gallery curator

## 2024-01-26 ENCOUNTER — Non-Acute Institutional Stay (HOSPICE_FACILITY): Payer: Self-pay

## 2024-01-26 NOTE — Hospice (Signed)
 Ms Dubach is a 89 year old female with hospice diagnosis of severe protein-calorie malnutrition. Patient lying in bed with eyes closed. She does not awaken for focused assessment. Daughter present for visit and reports that patients appetite continues to decline. Patient has a new skin tear to left arm. Cleaned with wound wash covered with telfa pad and wrapped with rolled gauze secured with tape. Patient tolerated well.  Daughter reports that patient has had edema to lower extremites and she is keeping them elevated on pillows. No edema present at this time. She denies any needs or concerns. Encouraged to call hosice with any needs or concerns.

## 2024-01-27 NOTE — Hospice Plan of Care (Addendum)
 Patient: [name]    Date: 01/27/2024  Time: 09:46    [facility] Nurse Notes    21. 89 year old female  2. Admitted 10/04/23.  2nd benefit period  3. Hospice Diagnosis: Severe protein calorie malnutrition  4. PPS: 30%  5. Sleeping how many hours 20/24  6. ADLs: 5/6  7. Arm Circumference: 14cm RUA  8. MPOA/HCS/person of contact: Arland Specking [phone]  9. LDWA's: O2   10. Upcoming appointments/diagnostic testing: None  11. Top priorities: shortness of breath  12. Safety concerns: Skin breakdown  13. Code Status: DNR  14. Changes since last IDG meeting: Decreased appetite  15. MPOA offered to take part in IDG meeting: Yes  16. IDG goal: Patient to be comfortable over the next two weeks   17. Pt/Family goal: Patient to be comfortable     [name] is bedbound, dependent of 5/6 ADL's. Appetite declining. Hallucinations increasing.   Incontinent of bowel and bladder. Very hard of hearing and difficult to communicate with. She wears continual oxygen  at 2L/NC. Appetite decreased to taking 50% of some meals during the day and not asking for food as often.       Signed by: Jeneen [name], RN

## 2024-01-27 NOTE — Hospice Plan of Care (Addendum)
 Spiritual Needs: separation from church , some anxiety    Interventions: spiritual care visits , prayer    Family/Patient Goals: support for the family    Changes (since previous IDG):  no changes    Spiritual Care Visit Frequency: upon the request of the daughter/MPOA

## 2024-01-27 NOTE — Hospice Plan of Care (Addendum)
 Social work will continue to monitor and offer supportive services as needed.

## 2024-01-27 NOTE — Hospice Plan of Care (Addendum)
 Bereavement Risk Score : Unchanged since last IDG    Appropriate grief anticipated among family members

## 2024-01-27 NOTE — Hospice Plan of Care (Addendum)
 Patient: [name]    Date: 01/27/2024  Time: 79:96    [facility] Social Worker Notes    Admission Date: 10/04/23     Routine Patient     Primary Hospice Diagnosis: Severe Protein Calorie Malnutrition   Other diagnosis: Heart disease     Phone call on 12/23/23    Primary Caregiver: Arland Specking     Capacity Status: incapacitated     Advance Directive Status:   1. Code Status - DNR   2. MPOA is Arland Specking   3. Final arrangements human gift registry with [employer]ol[employer] being back-up plan     Living Arrangements: Arland lives with Arland and her family     Current outside resources/services: none     Financial concerns: none     Current Psychosocial Concerns: none     Patient/family goals: Patient to have no pain or discomfort.     Observed changes by Child psychotherapist since last IDG: N/A     Spiritual/Volunteer status: hospice spiritual coordiantor makes regular visits.     Social work plan:   Social work will continue to assess patient and family needs by making monthly and PRN visits as well phone calls.     Signed by: Suzen Abu, SOCIAL WORKER

## 2024-01-28 ENCOUNTER — Other Ambulatory Visit: Payer: Self-pay

## 2024-01-28 ENCOUNTER — Non-Acute Institutional Stay (HOSPICE_FACILITY): Payer: Self-pay

## 2024-01-28 ENCOUNTER — Encounter (HOSPICE_FACILITY): Payer: Self-pay

## 2024-01-28 ENCOUNTER — Other Ambulatory Visit (HOSPICE_FACILITY): Payer: Self-pay

## 2024-01-28 ENCOUNTER — Other Ambulatory Visit (HOSPICE_FACILITY): Payer: Self-pay | Admitting: NURSE PRACTITIONER, FAMILY

## 2024-01-28 MED ORDER — QUETIAPINE 25 MG TABLET
25.0000 mg | ORAL_TABLET | Freq: Every evening | ORAL | 0 refills | Status: DC
Start: 2024-01-28 — End: 2024-03-01
  Filled 2024-01-28: qty 14, 14d supply, fill #0

## 2024-01-28 MED ORDER — QUETIAPINE 25 MG TABLET
25.0000 mg | ORAL_TABLET | Freq: Every evening | ORAL | Status: DC
Start: 2024-01-28 — End: 2024-02-12

## 2024-01-28 NOTE — Hospice (Signed)
 [name] is a 89 year old female with hospice diagnosis of severe protein-calorie malnutrition. Patient lying in bed and she states that she needs to tell this nurse something. She tells this nurse sheis in a nursing home and they are not doing their job. She does not get breakfast until way after time and that they never answer her call light. She expresses that she is very mad.  Grand daughter in law is present and she states that patient has been awake for over 24 hours and even thought she had called the police on her. Reached out to Melissa Bourgeois APRN and new orders received. Patient coopertive with focused assessment. She has several skin tears in various stages of healing due to hitting them on bed and picking at skin. Family denies any needs or concerns. Encouraged to call hosice with any needs or concerns.

## 2024-01-28 NOTE — Hospice Plan of Care (Addendum)
 Patient's diagnosis, plan of care, medications, DME, disciplines and person of contact/MPOA has been discussed with Dr. Lawerance Hospice Associate Medical Director during this IDT meeting         IDG DISCUSSION:  On 01/27/24 her prealbumin was 20.3 and Albumin was 2.5.          (The following discussion is being transcribed during current IDT meeting and includes all required disciplines.)

## 2024-01-28 NOTE — Hospice (Signed)
 bed bath no issues

## 2024-01-28 NOTE — Hospice Plan of Care (Addendum)
 No volunteer needs

## 2024-01-28 NOTE — Hospice Plan of Care (Addendum)
 Patient's diagnosis, plan of care, medications, DME, and disciplines have been discussed.  Severe protein calorie malnutrition.  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  O2 dependent.  Patient has a PPS of 30% sleeping about 12 hours out of the day.  Max assist with all ADLs.  Arm circumference has decrease a little over last month.   Patient is O2 dependent at 2 L per nasal cannula.  Is 5 ft 2 and weighs about 67 lb.  The BMI of 12.3. Does have O2 at home.   Patient does seem to be declining at 89 years old. Pt is getting more confused and doesn't recognize her daughter  Sleeping about 20/24 hours now increased lately.    I have reviewed patients diagnosis & comorbidities, following my review patient seems to be hospice appropriate at this time. Pt seems to have a poor prognosis.  Pt should have less then 6 months to live with normal process of the illness and comorbidities. The plan of care, I have reviewed and agree with the above IDG documentation   this week pt appetite has decreased which is a change and Wears O2 at 2 liters now.    total protein and albumin was checked. and low.

## 2024-02-01 ENCOUNTER — Other Ambulatory Visit: Payer: Self-pay

## 2024-02-01 ENCOUNTER — Other Ambulatory Visit (HOSPICE_FACILITY)

## 2024-02-01 NOTE — Hospice (Signed)
 89yo female admitted with severe protein-calorie malnutrition. Pt lethargic this am. No signs of pain. Daughter reports she had some coughing. Lungs are clear. Pt has skin tears x2 on left arm. That arm has edema and daughter reports it drained some fluid a few days ago. Has been eating only one bowl of food for meals now. Having hallucinations frequently. Doesn't recognize her daughter. Incontinent of bowel and bladder. No recent BM for over 1 week with bowel regimen in place. Encouraged daughter to call for any needs or concerns.

## 2024-02-01 NOTE — Hospice (Signed)
 bed bath no issues

## 2024-02-04 ENCOUNTER — Other Ambulatory Visit: Payer: Self-pay

## 2024-02-04 ENCOUNTER — Other Ambulatory Visit (HOSPICE_FACILITY): Payer: Self-pay

## 2024-02-04 NOTE — Hospice (Signed)
 89yo female admitted with severe protein-calorie malnutrition. Pt's son with her during visit. Spoke with Arland this am who reports no needs. Pt is sleeping, awakens easily but returns to sleep. Arland reports she is eating less of her pureed food, one bowl of food each meal instead of two. Has no signs of pain. Continues to have hallucinations and doesn't know who Arland is. Skin tears are healing. Skin warm and dry. No edema. No other wounds noted. Continual oxygen  at 2 L/NC. No BM in 10 days. BS+ and hypoactive in all 4 quadrants. Abd soft, no signs of tenderness noted with assessment. Encouraged son to call for any needs or concerns.

## 2024-02-04 NOTE — Hospice (Signed)
 bed bath no issues

## 2024-02-05 ENCOUNTER — Other Ambulatory Visit: Payer: Self-pay

## 2024-02-08 ENCOUNTER — Other Ambulatory Visit: Payer: Self-pay

## 2024-02-08 ENCOUNTER — Non-Acute Institutional Stay: Payer: Self-pay

## 2024-02-08 ENCOUNTER — Other Ambulatory Visit (HOSPICE_FACILITY)

## 2024-02-08 NOTE — Hospice (Signed)
 A routine social work assessment completed in the home. Present was patient lying in bed along with Arland Specking (daughter/MPOA) and Donna's spouse.     Primary Hospice Diagnosis: Severe Protein Calorie Malnutrition  Other diagnosis: Heart disease     Living Arrangements/Caregiving Plan:  Patient lives with Arland and her spouse. Donna's brother Mariadelosang Wynns takes patient to his home on the weekends to provide Arland a break.      Patient/Family Goal:  Patient to have no pain or discomfort.      Capacity: incapacitated     Advanced Directives:  1. Code Status - DNR  2. MPOA is Arland Specking (primary) and Oneil Holmes (secondary)  3. Final arrangements human gift registry with Nicholaus being back-up plan     Veteran: no     Financial: No needs identified.     In-home Services: none     Religion:  Hospice spiritual coordinator makes regular visits.     Anxiety / Depression: none reported     Coping:  Arland and her brother are coping well with prognosis. Arland reported patient goes in and out of orientation; therefore unknown if she understands her prognosis.      Support System:  Patient has a good support system consisting of her children and their families along with Oneil Holmes, brother.      Functional Status:  Patient is very hard of hearing; therefore difficult to discern her orientation. Patient was lethargic. Arland reports patient is able to make her needs known. Patient is bedbound. Patient requires assistance with all ADLs except eating. Arland feeds patient baby food. Patient does not like to be fed; therefore, Arland purchased baby bottles and cut the ends out of the nipples and patient drinks the baby food from the bottle. Patient uses child sippy cups. According to Arland, patient's appetite is stable. Patient is hard of hearing and poor vision.     Arland reported no needs or concerns. Encouraged her to call hospice with any needs/concerns/questions.     Decline Since last visit:  1. Patient appeared  thinner.  2. Drinking baby food from baby bottle and using sippy cups.     Social work plan:  Social work will continue to assess patient and family needs by making monthly and PRN visits as well as phone calls.

## 2024-02-08 NOTE — Hospice (Signed)
 89yo female admitted with severe protein-calorie malnutrition. Pt sleeping upon arrival. Pt awakens easily. Daughter reports she is only eating one pouch of baby food or one bowl for each meal. Skin tears are healing. No other wounds noted. Skin warm and dry. Able to feed herself but dependent of all other ADL's. Incontinent of bowel and bladder. Having some BM smears but no real BM. BS+ in all 4 quadrants. Abd soft, no signs of pain with palpating her abdomen. Pt is sleeping better at night since adding the seroquel . Doesn't know her daughter most of the time and continues to have hallucinations. Encouraged daughter to call for any needs or concerns.

## 2024-02-08 NOTE — Hospice (Signed)
 bed bath no issues

## 2024-02-10 NOTE — Hospice Plan of Care (Addendum)
 Bereavement Risk Score : Unchanged since last IDG    Appropriate grief anticipated among family members

## 2024-02-10 NOTE — Hospice Plan of Care (Addendum)
 Spiritual Needs: separation from church , anxiety    Interventions: spiritual care vistis , prayer    Family/Patient Goals: support for the family    Changes (since previous IDG): no changes    Spiritual Care Visit Frequency: upon request of the daughter

## 2024-02-10 NOTE — Hospice Plan of Care (Addendum)
 Patient: [name]    Date: 02/10/2024  Time: 21:04    [facility] Nurse Notes    4. 89 year old female  2. Admitted 10/04/23.  2nd benefit period  3. Hospice Diagnosis: Severe protein calorie malnutrition  4. PPS: 30%  5. Sleeping how many hours 21/24  6. ADLs: 5/6  7. Arm Circumference: 14cm RUA  8. MPOA/HCS/person of contact: Arland Specking [phone]  9. LDWA's: O2   10. Upcoming appointments/diagnostic testing: None  11. Top priorities: shortness of breath  12. Safety concerns: Skin breakdown, skin tears  13. Code Status: DNR  14. Changes since last IDG meeting: None  15. MPOA offered to take part in IDG meeting: Yes  16. IDG goal: Patient to be without skin tears over the next two weeks   17. Pt/Family goal: Patient to be comfortable     Cariann lives with her daughter who is her primary caregiver. She is taken to her sons every weekend. She is unable to stand or ambulate and is transferred by a transfer chair. She is bedbound and dependent of all ADL's except eating. She eats less now. She was eating two bowls of baby food or pureed food and now is eating one smaller bowl or pouch of baby food.  She is using a baby bottle with the nipple cut larger because of spilling things in her bed.  She is not eating cookies now, as she was eating 3-4 daily. She is thin and frail and obtains skin tears easily. She is having hallucinations and doesn't know her daughter at times. She has problems with constipation and is on a bowel regimen.       Signed by: Jeneen [name], RN

## 2024-02-10 NOTE — Hospice Plan of Care (Addendum)
 Social work will continue to monitor and offer supportive services as needed.

## 2024-02-10 NOTE — Hospice Plan of Care (Addendum)
 Patient: [name]    Date: 02/10/2024  Time: 79:74    [facility] Social Worker Notes    Admission Date: 10/04/23      Routine Patient      Primary Hospice Diagnosis: Severe Protein Calorie Malnutrition    Other diagnosis: Heart disease      Last Visit: 02/05/24      Primary Caregiver: Arland Specking      Capacity Status: incapacitated      Advance Directive Status:    1. Code Status - DNR    2. MPOA is Arland Specking    3. Final arrangements human gift registry with [employer] being back-up plan      Living Arrangements: Arland lives with Arland and her family      Current outside resources/services: none      Financial concerns: none      Current Psychosocial Concerns: none      Patient/family goals: Patient to have no pain or discomfort.      Observed changes by Child psychotherapist since last IDG:    1. Patient appeared thinner.    2. Drinking baby food from baby bottle and using sippy cups.      Spiritual/Volunteer status: hospice spiritual coordiantor makes regular visits.      Social work plan:    Social work will continue to assess patient and family needs by making monthly and PRN visits as well phone calls.     Signed by: Suzen Abu, SOCIAL WORKER

## 2024-02-11 ENCOUNTER — Other Ambulatory Visit: Payer: Self-pay

## 2024-02-11 ENCOUNTER — Other Ambulatory Visit (HOSPICE_FACILITY): Payer: Self-pay | Attending: Internal Medicine

## 2024-02-11 ENCOUNTER — Encounter (HOSPICE_FACILITY): Payer: Self-pay

## 2024-02-11 ENCOUNTER — Non-Acute Institutional Stay (HOSPICE_FACILITY): Payer: Self-pay | Admitting: Family Medicine

## 2024-02-11 ENCOUNTER — Other Ambulatory Visit: Payer: Self-pay | Admitting: Oncology

## 2024-02-11 ENCOUNTER — Telehealth: Payer: Self-pay

## 2024-02-11 DIAGNOSIS — D490 Neoplasm of unspecified behavior of digestive system: Secondary | ICD-10-CM

## 2024-02-11 DIAGNOSIS — C641 Malignant neoplasm of right kidney, except renal pelvis: Secondary | ICD-10-CM

## 2024-02-11 DIAGNOSIS — Z853 Personal history of malignant neoplasm of breast: Secondary | ICD-10-CM

## 2024-02-11 DIAGNOSIS — R918 Other nonspecific abnormal finding of lung field: Secondary | ICD-10-CM

## 2024-02-11 NOTE — Hospice Plan of Care (Addendum)
 01/28/2024:      INTERVENTION - Instruct appropriate strategies to prevent choking / aspiration (Freq: Each Visit) Start: 01/28/2024      --------    MED - QUEtiapine  (SEROQUEL ) 25 mg Oral Tablet (New)

## 2024-02-11 NOTE — Hospice Plan of Care (Addendum)
 Patient's diagnosis, plan of care, medications, DME, and disciplines have been discussed.  Severe protein calorie malnutrition.  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  O2 dependent.  Patient has a PPS of 30% sleeping about 12 hours out of the day.  Max assist with all ADLs.  Arm circumference has decrease a little over last month.   Patient is O2 dependent at 2 L per nasal cannula.  Is 5 ft 2 and weighs about 67 lb.  The BMI of 12.3. Does have O2 at home.   Patient does seem to be declining at 89 years old. Pt is getting more confused and doesn't recognize her daughter  Sleeping about 20 out of 24 hours now    I have reviewed patients diagnosis & comorbidities, following my review patient seems to be hospice appropriate at this time. Pt seems to have a poor prognosis.  Pt should have less then 6 months to live with normal process of the illness and comorbidities. The plan of care, I have reviewed and agree with the above IDG documentation   this week pt appetite has decreased which is a change and Wears O2 at 2 liters now.    total protein and albumin was checked. and low.

## 2024-02-11 NOTE — Hospice Plan of Care (Addendum)
 No volunteer needs

## 2024-02-11 NOTE — Hospice (Signed)
 bed bath no issues

## 2024-02-11 NOTE — Hospice (Signed)
 89yo female admitted with severe protein-calorie malnutrition. Pt resting in bed. Daughter reports she talked all night. Has hallucinations, doesn't know her daughter at times. Pt spilled her eggs in her bed today. Incontinent of bowel and bladder. Had a small BM today. Continual oxyger at 2L/NC. Encouraged to call for any needs or concerns

## 2024-02-11 NOTE — Telephone Encounter (Signed)
 Pt's daughter calling to ask for scan appt and f/u appt for her mom, as she hasn't gotten calls yet.

## 2024-02-12 ENCOUNTER — Other Ambulatory Visit: Payer: Self-pay

## 2024-02-12 ENCOUNTER — Other Ambulatory Visit (HOSPICE_FACILITY): Payer: Self-pay | Admitting: NURSE PRACTITIONER, FAMILY

## 2024-02-12 MED ORDER — QUETIAPINE 25 MG TABLET
25.0000 mg | ORAL_TABLET | Freq: Every evening | ORAL | 0 refills | Status: AC
Start: 2024-02-12 — End: 2024-02-29
  Filled 2024-02-12: qty 14, 14d supply, fill #0

## 2024-02-15 ENCOUNTER — Other Ambulatory Visit: Payer: Self-pay

## 2024-02-15 ENCOUNTER — Telehealth: Payer: Self-pay | Admitting: Oncology

## 2024-02-15 NOTE — Hospice (Addendum)
 bed bath no issues and delivered(Quetiapine )

## 2024-02-15 NOTE — Hospice (Signed)
 89yo female admitted with severe protein-calorie malnutrition. Pt ate 1/2 a bowl of cream of wheat and a scrambled egg. Pt confused and talking about someone stealing her purse. At times she knows her daughter and other times she doesn't. Pt without signs of pain. Skin tears healing. No change in arm circumference. Mental status has declined, Has hallucinations. Encouraged daughter to call for any needs or concerns

## 2024-02-15 NOTE — Telephone Encounter (Signed)
 Patient has been scheduled for follow-up visit per 02/12/24 LOS.  Pt aware of scheduled appt details.

## 2024-02-18 ENCOUNTER — Other Ambulatory Visit: Payer: Self-pay

## 2024-02-18 ENCOUNTER — Other Ambulatory Visit (HOSPICE_FACILITY): Payer: Self-pay

## 2024-02-18 NOTE — Hospice (Signed)
 bed bath no issues

## 2024-02-18 NOTE — Hospice (Signed)
 89yo female admitted with severe protein-calorie malnutrition. Pt awake and alert. Thinks this nurse is her daughter. Has memory loss, Thinks people steal from her and she gets anxious, talks all night sometimes. Sleeps most of the day. Is taking pureed foods. No signs of pain, continual oxygen  at 2L/NC. Unable to stand and pivot. Daughter picks her upand puts her in the wheelchair. She goes to visit her son on the weekends. Encouraged daughter to call for any needs or concerns.

## 2024-02-22 ENCOUNTER — Other Ambulatory Visit (HOSPICE_FACILITY): Payer: Self-pay

## 2024-02-22 ENCOUNTER — Other Ambulatory Visit: Payer: Self-pay

## 2024-02-22 NOTE — Hospice (Signed)
 89yo female admitted with severe protein-calorie malnutrition. Pt awake and alert in bed. Talks about people who aren't here, has hallucinations. No signs of pain. Daughter reports she continues to eat pureed food and baby food. Continual oxygen  at 2L/NC. No dyspnea noted. Pt has distended, soft abdomen. Hypoactive bowel sounds. Passing gas but no BM. Skin is dry and warm. Dependent of 5/6 ADL's. Can feed herself. Daughter gets her up into a wheelchair to take her to her son's home for the weekends. Lungs clear and diminished. Incontinent of bowel and bladder. Unable to communicate her needs or concerns. Encouraged daughter to call for any needs or concerns.

## 2024-02-22 NOTE — Hospice (Signed)
 bed bath no issues

## 2024-02-24 ENCOUNTER — Non-Acute Institutional Stay (HOSPICE_FACILITY): Payer: Self-pay

## 2024-02-24 NOTE — Hospice Plan of Care (Addendum)
 Patient: [name]    Date: 02/24/2024  Time: 20:27    [facility] Social Worker Notes    Admission Date: 10/04/23      Routine Patient      Primary Hospice Diagnosis: Severe Protein Calorie Malnutrition    Other diagnosis: Heart disease      Last Visit: 02/05/24      Primary Caregiver: Arland Specking      Capacity Status: incapacitated      Advance Directive Status:    1. Code Status - DNR    2. MPOA is Arland Specking    3. Final arrangements human gift registry with [employer]ol[employer] being back-up plan      Living Arrangements: Arland lives with Arland and her family      Current outside resources/services: none      Financial concerns: none      Current Psychosocial Concerns: none      Patient/family goals: Patient to have no pain or discomfort.      Observed changes by Child psychotherapist since last IDG:    1. Patient appeared thinner.    2. Drinking baby food from baby bottle and using sippy cups.      Spiritual/Volunteer status: hospice spiritual coordiantor makes regular visits.      Social work plan:    Social work will continue to assess patient and family needs by making monthly and PRN visits as well phone calls.     Signed by: Suzen Abu, SOCIAL WORKER

## 2024-02-24 NOTE — Hospice Plan of Care (Addendum)
 Spiritual Needs: Separation from church , some anxiety    Interventions: spiritual care visits , prayer    Family/Patient Goals: support for the family    Changes (since previous IDG):  no changes    Spiritual Care Visit Frequency: upon request of the daughter/MPOA

## 2024-02-24 NOTE — Hospice Plan of Care (Addendum)
 Patient: [name]    Date: 02/24/2024  Time: 17:16    [facility] Nurse Notes    35. 89 year old female  2. Admitted 10/04/23.  2nd benefit period  3. Hospice Diagnosis: Severe protein calorie malnutrition  4. PPS: 30%  5. Sleeping how many hours 21/24  6. ADLs: 5/6  7. Arm Circumference: 14cm RUA  8. MPOA/HCS/person of contact: Arland Specking [phone]  9. LDWA's: O2   10. Upcoming appointments/diagnostic testing: None  11. Top priorities: shortness of breath  12. Safety concerns: Skin breakdown, skin tears  13. Code Status: DNR  14. Pain Medication: None  15. Anxiety Medication: Ativan  1mg  every 6 /PRN and Seroquel  25mg  nightly   16. Changes since last IDG meeting: None  17. MPOA offered to take part in IDG meeting: Yes  18. IDG goal: Patient to be without skin breakdown over the next two weeks   19. Pt/Family goal: Patient to be comfortable     [name] lives with her daughter who is her primary caregiver. She is taken to her sons every weekend. She is unable to stand or ambulate and is transferred by a transfer chair. She is bedbound and dependent of all ADL's except eating. She eats less now. She was eating two bowls of baby food or pureed food and now is eating one smaller bowl or pouch of baby food.  She is using a baby bottle with the nipple cut larger because of spilling things in her bed.  She is thin and frail and obtains skin tears easily. She is having hallucinations and doesn't know her daughter at times. She has problems with constipation and is on a bowel regimen.       Signed by: Jeneen [name], RN

## 2024-02-24 NOTE — Hospice Plan of Care (Addendum)
 Social work will continue to monitor and offer supportive services as needed.

## 2024-02-24 NOTE — Hospice Plan of Care (Addendum)
 Bereavement Risk Score : Unchanged since last IDG    Appropriate grief anticipated among family members

## 2024-02-25 ENCOUNTER — Non-Acute Institutional Stay (HOSPICE_FACILITY): Payer: Self-pay

## 2024-02-25 ENCOUNTER — Other Ambulatory Visit (HOSPICE_FACILITY): Payer: Self-pay

## 2024-02-25 ENCOUNTER — Encounter (HOSPICE_FACILITY): Payer: Self-pay

## 2024-02-25 ENCOUNTER — Other Ambulatory Visit: Payer: Self-pay

## 2024-02-25 NOTE — Hospice Plan of Care (Addendum)
 02/12/2024:      INTERVENTION - Assess spiritual needs (Freq: PRN) Start: 10/04/2023      --------    INTERVENTION - Provide spiritual support (Freq: Each Visit) Start: 10/04/2023      --------    INTERVENTION - Provide spiritual support (Freq: Each Visit) Start: 10/04/2023      --------    INTERVENTION - Assess emotional status and coping mechanisms (Freq: PRN) Start: 02/11/2024      --------    INTERVENTION - Assess family's level of coping (Freq: PRN) Start: 02/11/2024      --------    INTERVENTION - Encourage caregivers to read transitions booklet (Freq: PRN) Start: 02/11/2024      --------    INTERVENTION - Encourage family to establish realistic goals (Freq: PRN) Start: 02/11/2024        02/11/2024:      INTERVENTION - Aide assist with shampoo (Freq: Each Visit) Start: 10/15/2023      --------    INTERVENTION - Aide assist with dressing (Freq: Each Visit) Start: 10/15/2023      --------    INTERVENTION - Aide assist with foot care (Freq: Each Visit) Start: 10/15/2023      --------    INTERVENTION - Aide assist with oral care (Freq: Each Visit) Start: 10/15/2023      --------    INTERVENTION - Aide assist with shower (Freq: Each Visit) Start: 10/15/2023  Resolved: 02/11/2024

## 2024-02-25 NOTE — Hospice Plan of Care (Addendum)
 No volunteer needs

## 2024-02-25 NOTE — Case Communication (Signed)
 Missed visit due to patient MPOA cancelling .MPOA stated that patient was going fine today with no changes.

## 2024-02-25 NOTE — Hospice Plan of Care (Addendum)
 Patient's diagnosis, plan of care, medications, DME, and disciplines have been discussed.  Severe protein calorie malnutrition.  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  O2 dependent.  Patient has a PPS of 30% sleeping about 12 hours out of the day.  Max assist with all ADLs.  Arm circumference has decrease a little over last month.   Patient is O2 dependent at 2 L per nasal cannula.  Is 5 ft 2 and weighs about 67 lb.  The BMI of 12.3. Does have O2 at home.   Patient does seem to be declining at 89 years old. Pt is getting more confused and doesn't recognize her daughter  Sleeping about 20 out of 24 hours now    I have reviewed patients diagnosis & comorbidities, following my review patient seems to be hospice appropriate at this time. Pt seems to have a poor prognosis.  Pt should have less then 6 months to live with normal process of the illness and comorbidities. The plan of care, I have reviewed and agree with the above IDG documentation   this week pt eating thru baby nipple that is cut to larger hole  which is a change     total protein and albumin was checked. and low.

## 2024-02-29 ENCOUNTER — Other Ambulatory Visit (HOSPICE_FACILITY): Payer: Self-pay

## 2024-02-29 ENCOUNTER — Other Ambulatory Visit: Payer: Self-pay

## 2024-02-29 NOTE — Hospice (Signed)
 bed bath no issues

## 2024-02-29 NOTE — Hospice (Signed)
 89yo female admitted with severe protein-calorie malnutrition. Pt currently at her son's home. Sleeping upon arrival. Pt without signs of pain or dyspnea. Is currently on room air. Had a BM this am. Abd soft, BS+. Son reports she didn't eat yesterday because she slept all day. Awakens easily and ate breakfast this am. Oatmeal and applesauce. Encouraged son to call for any needs or concerns.

## 2024-03-01 ENCOUNTER — Other Ambulatory Visit: Payer: Self-pay

## 2024-03-01 ENCOUNTER — Other Ambulatory Visit (HOSPICE_FACILITY): Payer: Self-pay | Admitting: NURSE PRACTITIONER, FAMILY

## 2024-03-01 MED ORDER — QUETIAPINE 25 MG TABLET
25.0000 mg | ORAL_TABLET | Freq: Every evening | ORAL | 0 refills | Status: DC
Start: 2024-03-01 — End: 2024-03-17
  Filled 2024-03-01: qty 14, 14d supply, fill #0

## 2024-03-02 ENCOUNTER — Other Ambulatory Visit: Payer: Self-pay

## 2024-03-02 ENCOUNTER — Other Ambulatory Visit (HOSPICE_FACILITY)

## 2024-03-02 NOTE — Hospice (Signed)
 dropped off (Quetiapine )

## 2024-03-03 ENCOUNTER — Other Ambulatory Visit: Payer: Self-pay

## 2024-03-03 ENCOUNTER — Other Ambulatory Visit (HOSPICE_FACILITY)

## 2024-03-03 NOTE — Hospice (Signed)
 bed bath no issues

## 2024-03-03 NOTE — Hospice (Signed)
 89yo female admitted with severe protein-calorie malnutrition. Pt is awake, just had her bath. No signs of pain. Very hard of hearing and difficult to communicate her needs. Pt currently on room air. Has an enogen at the bedside. Pt had a bm this am. Abdomen distended and soft and has a palpable hard area in the RUQ. New since last visit. Son reports she ate oatmeal and applesauce for breakfast. Skin warm and dry. No open wounds. Pt staying with her son because daughter's husband is hospitalized. Encouraged son to call for any needs or concerns

## 2024-03-07 ENCOUNTER — Other Ambulatory Visit: Payer: Self-pay

## 2024-03-07 ENCOUNTER — Other Ambulatory Visit (HOSPICE_FACILITY): Payer: Self-pay

## 2024-03-07 NOTE — Hospice (Signed)
 89yo female admitted with severe protein-calorie malnutrition. Pt awake in bed, has oxygen  on at 2L/NC. Son reports she c/o feeling short of breath so he put her oxygen  on. Pt continues to eat pureed food and baby food. No signs of pain. Unable to effectively communicate her needs. Very hard of healing and difficult to communicate with. Skin warm and dry. Incontinent of bowel and bladder. Abd distended and soft, hard area in RUQ is gone today. No edema. No open wounds. Encouraged to call for any needs or concerns.

## 2024-03-07 NOTE — Hospice (Signed)
 bed bath no issues

## 2024-03-08 ENCOUNTER — Non-Acute Institutional Stay (HOSPICE_FACILITY): Payer: Self-pay

## 2024-03-09 ENCOUNTER — Non-Acute Institutional Stay (HOSPICE_FACILITY): Payer: Self-pay

## 2024-03-09 NOTE — Hospice Plan of Care (Signed)
 Spiritual Needs: separation from church ,some depression    Interventions: spiritual care visits    Family/Patient Goals: support  for the family    Changes (since previous IDG):  no changes    Spiritual Care Visit Frequency: upon request of the daughter/MPOA

## 2024-03-09 NOTE — Hospice Plan of Care (Signed)
 Patient: [name]    Date: 03/09/2024  Time: 13:46    [facility] Nurse Notes    38. 89 year old female  2. Admitted 10/04/23.  2nd benefit period  3. Hospice Diagnosis: Severe protein calorie malnutrition  4. PPS: 30%  5. Sleeping how many hours 21/24  6. ADLs: 5/6  7. Arm Circumference: 14cm RUA  8. MPOA/HCS/person of contact: Arland Specking [phone]  9. LDWA's: O2   10. Upcoming appointments/diagnostic testing: None  11. Top priorities: shortness of breath  12. Safety concerns: Skin breakdown, skin tears  13. Code Status: DNR  14. Pain Medication: None  15. Anxiety Medication: Ativan  1mg  every 6 /PRN and Seroquel  25mg  nightly   16. Changes since last IDG meeting: None  17. MPOA offered to take part in IDG meeting: Yes  18. IDG goal: Patient to be without skin breakdown over the next two weeks   19. Pt/Family goal: Patient to be comfortable     [name] has been staying with her son since her son in law has been hospitalized.  She is unable to stand or ambulate and is transferred by a transfer chair. She is bedbound and dependent of all ADL's except eating.  She is using a baby bottle with the nipple cut larger because of spilling things in her bed.  She is thin and frail and obtains skin tears easily. She is having hallucinations and doesn't know her daughter at times. She has problems with constipation and is on a bowel regimen. Recently has had several BM's.       Signed by: Jeneen [name], RN

## 2024-03-09 NOTE — Hospice Plan of Care (Signed)
 Social work will continue to monitor and offer supportive services as needed.

## 2024-03-09 NOTE — Hospice Plan of Care (Signed)
 Patient: [name]    Date: 03/09/2024  Time: 15:03    [facility] Social Worker Notes    Admission Date: 10/04/23      Routine Patient      Primary Hospice Diagnosis: Severe Protein Calorie Malnutrition    Other diagnosis: Heart disease      Last Visit: 02/05/24      Primary Caregiver: Arland Specking      Capacity Status: incapacitated      Advance Directive Status:    1. Code Status - DNR    2. MPOA is Arland Specking    3. Final arrangements human gift registry with [employer]ol[employer] being back-up plan      Living Arrangements: Arland lives with Arland and her family      Current outside resources/services: none      Financial concerns: none      Current Psychosocial Concerns: none      Patient/family goals: Patient to have no pain or discomfort.      Observed changes by Child psychotherapist since last IDG:    1. Patient appeared thinner.    2. Drinking baby food from baby bottle and using sippy cups.      Spiritual/Volunteer status: hospice spiritual coordiantor makes regular visits.      Social work plan:    Social work will continue to assess patient and family needs by making monthly and PRN visits as well phone calls.     Signed by: Suzen Abu, SOCIAL WORKER

## 2024-03-09 NOTE — Hospice Plan of Care (Signed)
 No volunteer needs

## 2024-03-10 ENCOUNTER — Non-Acute Institutional Stay (HOSPICE_FACILITY): Payer: Self-pay | Admitting: Family Medicine

## 2024-03-10 ENCOUNTER — Other Ambulatory Visit: Payer: Self-pay

## 2024-03-10 ENCOUNTER — Ambulatory Visit (HOSPITAL_BASED_OUTPATIENT_CLINIC_OR_DEPARTMENT_OTHER): Payer: Self-pay | Admitting: Internal Medicine

## 2024-03-10 ENCOUNTER — Encounter (HOSPICE_FACILITY): Payer: Self-pay

## 2024-03-10 NOTE — Hospice (Signed)
 Ms Keeling an 89 year old female identified by name and facial recognition. 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 Patient lying in personal bed with head elevated on pillows. Patient states that she has no pain or discomfort. Son present for visit. He denies any needs. Educated on the importance of positioning patient upright during meals and for at least 30 minutes after eating. Reviewed modifications to food textures and thickening liquids to reduce aspiration risk. Current hospice plan of care is meeting patient and caregiver needs as assessed this LPN visit. No revision to the hospice plan of care indicated at this time. Patient has sufficient amount of medications and supplies to last at least the next 7 days. Caregiver verbalized understanding and agreement to call hospice 24/7 with any change in condition, questions or needs.

## 2024-03-10 NOTE — Hospice Plan of Care (Signed)
 Patient's diagnosis, plan of care, medications, DME, and disciplines have been discussed.  Severe protein calorie malnutrition.  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  O2 dependent.  Patient has a PPS of 30% sleeping about 12 hours out of the day.  Max assist with all ADLs.  Arm circumference is 14 cm.   Patient is O2 dependent at 2 L per nasal cannula.  Is 5 ft 2 and weighs about 67 lb.  The BMI of 12.3. Does have O2 at home.   Pt is getting more confused and doesn't recognize her daughter  Sleeping about 21 out of 24 hours now    I have reviewed patients diagnosis & comorbidities, following my review patient seems to be hospice appropriate at this time. Pt seems to have a poor prognosis.  Pt should have less then 6 months to live with normal process of the illness and comorbidities. The plan of care, I have reviewed and agree with the above IDG documentation   this week pt eating thru baby nipple that is cut to larger hole.  total protein and albumin was checked. and low. Living with son for now. Husband is in the hospital.

## 2024-03-10 NOTE — Hospice (Signed)
 bed bath no issues

## 2024-03-11 ENCOUNTER — Other Ambulatory Visit (HOSPICE_FACILITY)

## 2024-03-14 ENCOUNTER — Other Ambulatory Visit: Payer: Self-pay

## 2024-03-14 ENCOUNTER — Other Ambulatory Visit (HOSPICE_FACILITY): Payer: Self-pay | Attending: Internal Medicine

## 2024-03-17 ENCOUNTER — Other Ambulatory Visit: Payer: Self-pay

## 2024-03-17 ENCOUNTER — Other Ambulatory Visit (HOSPICE_FACILITY): Payer: Self-pay | Admitting: NURSE PRACTITIONER, FAMILY

## 2024-03-17 ENCOUNTER — Other Ambulatory Visit (HOSPICE_FACILITY)

## 2024-03-17 MED ORDER — QUETIAPINE 25 MG TABLET
25.0000 mg | ORAL_TABLET | Freq: Every evening | ORAL | 0 refills | Status: AC
Start: 2024-03-17 — End: ?
  Filled 2024-03-17: qty 14, 14d supply, fill #0

## 2024-03-17 MED ORDER — LORAZEPAM 1 MG TABLET
1.0000 mg | ORAL_TABLET | Freq: Four times a day (QID) | ORAL | 0 refills | Status: AC | PRN
Start: 2024-03-17 — End: 2024-03-31
  Filled 2024-03-17: qty 56, 14d supply, fill #0

## 2024-03-17 NOTE — Hospice (Signed)
 dropped off (Lorazepam  and Quetiapine )

## 2024-03-17 NOTE — Hospice (Signed)
 bed bath no issues

## 2024-03-17 NOTE — Hospice (Signed)
 89yo female admitted with severe proteind calorie malnutrition. Pt is resting in bed, awake and alert. Very hard of hearing and difficulty to communicate with. Pt without signs of pain. Son reports she is eating at least 3 meals a day and able to feed herself. Confused and not always recognizing her son. No dyspnea, on room air. Lungs clear. Skin warm and dry. No edema. Encouraged son to call for any needs or concerns

## 2024-03-18 ENCOUNTER — Non-Acute Institutional Stay (HOSPICE_FACILITY): Payer: Self-pay

## 2024-03-19 ENCOUNTER — Non-Acute Institutional Stay (HOSPICE_FACILITY): Payer: Self-pay

## 2024-03-19 ENCOUNTER — Other Ambulatory Visit (HOSPICE_FACILITY)

## 2024-03-21 ENCOUNTER — Non-Acute Institutional Stay: Payer: Self-pay

## 2024-03-21 ENCOUNTER — Other Ambulatory Visit: Payer: Self-pay

## 2024-03-23 NOTE — Hospice Plan of Care (Addendum)
 Patient: [name]    Date: 03/23/2024  Time: 12:00    [facility] Social Worker Notes    Patient ceased to breathe on 16-Apr-2024.    Signed by: Suzen Abu, SOCIAL WORKER

## 2024-03-23 NOTE — Hospice Plan of Care (Addendum)
 Post mortem assessment completed with a risk score of low.     Bereavement service plan initiated.

## 2024-03-24 ENCOUNTER — Encounter (HOSPICE_FACILITY): Payer: Self-pay

## 2024-03-24 ENCOUNTER — Non-Acute Institutional Stay (HOSPICE_FACILITY): Payer: Self-pay

## 2024-03-24 ENCOUNTER — Non-Acute Institutional Stay: Payer: Self-pay

## 2024-03-24 ENCOUNTER — Other Ambulatory Visit: Payer: Self-pay

## 2024-03-24 NOTE — Hospice Plan of Care (Addendum)
 03/17/2024:      INTERVENTION - Perform monthly arm circumerference. (Freq: Selected Visits) Start: 10/04/2023        03/10/2024:      INTERVENTION - Perform monthly arm circumerference. (Freq: Selected Visits) Start: 10/04/2023

## 2024-03-24 NOTE — Hospice Plan of Care (Addendum)
 Patient: [name]    Death/discharge IDG reviewed with medical director Dr. Charlie Hoh    Priority Issues:  Bereavement

## 2024-03-27 ENCOUNTER — Non-Acute Institutional Stay (HOSPICE_FACILITY): Payer: Self-pay | Admitting: Family Medicine

## 2024-03-27 NOTE — Hospice Plan of Care (Signed)
 Patient's death has been discussed.  I have reviewed and agree with the above IDG documentation  89 year old female patient with a history of severe protein calorie malnutrition ischemic cardiomyopathy systolic dysfunction hypothyroidism dementia incontinent of bladder and bowel O2 dependent patient died at her home on April 15, 2024 at 4:25 p.m.

## 2024-03-28 ENCOUNTER — Non-Acute Institutional Stay: Payer: Self-pay

## 2024-03-28 ENCOUNTER — Other Ambulatory Visit: Payer: Self-pay

## 2024-03-31 ENCOUNTER — Other Ambulatory Visit: Payer: Self-pay

## 2024-03-31 ENCOUNTER — Non-Acute Institutional Stay: Payer: Self-pay

## 2024-03-31 ENCOUNTER — Inpatient Hospital Stay

## 2024-03-31 ENCOUNTER — Other Ambulatory Visit

## 2024-03-31 ENCOUNTER — Inpatient Hospital Stay: Attending: Oncology

## 2024-03-31 ENCOUNTER — Ambulatory Visit (INDEPENDENT_AMBULATORY_CARE_PROVIDER_SITE_OTHER)
Admission: RE | Admit: 2024-03-31 | Discharge: 2024-03-31 | Disposition: A | Discharge status: Home / Self Care | Source: Ambulatory Visit | Attending: Oncology | Admitting: Oncology

## 2024-03-31 DIAGNOSIS — R918 Other nonspecific abnormal finding of lung field: Secondary | ICD-10-CM | POA: Diagnosis not present

## 2024-03-31 DIAGNOSIS — M810 Age-related osteoporosis without current pathological fracture: Secondary | ICD-10-CM | POA: Diagnosis not present

## 2024-03-31 DIAGNOSIS — C641 Malignant neoplasm of right kidney, except renal pelvis: Secondary | ICD-10-CM | POA: Diagnosis not present

## 2024-03-31 DIAGNOSIS — Z853 Personal history of malignant neoplasm of breast: Secondary | ICD-10-CM

## 2024-03-31 DIAGNOSIS — D490 Neoplasm of unspecified behavior of digestive system: Secondary | ICD-10-CM | POA: Diagnosis not present

## 2024-03-31 LAB — CBC WITH DIFFERENTIAL (CANCER CENTER ONLY)
Abs Immature Granulocytes: 0.02 K/uL (ref 0.00–0.07)
Basophils Absolute: 0.1 K/uL (ref 0.0–0.1)
Basophils Relative: 1 %
Eosinophils Absolute: 0.7 K/uL — ABNORMAL HIGH (ref 0.0–0.5)
Eosinophils Relative: 8 %
HCT: 37.5 % (ref 36.0–46.0)
Hemoglobin: 12.1 g/dL (ref 12.0–15.0)
Immature Granulocytes: 0 %
Lymphocytes Relative: 20 %
Lymphs Abs: 1.6 K/uL (ref 0.7–4.0)
MCH: 31 pg (ref 26.0–34.0)
MCHC: 32.3 g/dL (ref 30.0–36.0)
MCV: 96.2 fL (ref 80.0–100.0)
Monocytes Absolute: 0.8 K/uL (ref 0.1–1.0)
Monocytes Relative: 10 %
Neutro Abs: 5.1 K/uL (ref 1.7–7.7)
Neutrophils Relative %: 61 %
Platelet Count: 256 K/uL (ref 150–400)
RBC: 3.9 MIL/uL (ref 3.87–5.11)
RDW: 12.1 % (ref 11.5–15.5)
WBC Count: 8.3 K/uL (ref 4.0–10.5)
nRBC: 0 % (ref 0.0–0.2)

## 2024-03-31 LAB — CMP (CANCER CENTER ONLY)
ALT: 26 U/L (ref 0–44)
AST: 28 U/L (ref 15–41)
Albumin: 3.9 g/dL (ref 3.5–5.0)
Alkaline Phosphatase: 68 U/L (ref 38–126)
Anion gap: 13 (ref 5–15)
BUN: 11 mg/dL (ref 8–23)
CO2: 25 mmol/L (ref 22–32)
Calcium: 10 mg/dL (ref 8.9–10.3)
Chloride: 99 mmol/L (ref 98–111)
Creatinine: 0.8 mg/dL (ref 0.44–1.00)
GFR, Estimated: >60 mL/min (ref >60)
Glucose, Bld: 92 mg/dL (ref 70–99)
Potassium: 4.1 mmol/L (ref 3.5–5.1)
Sodium: 137 mmol/L (ref 135–145)
Total Bilirubin: 0.4 mg/dL (ref 0.0–1.2)
Total Protein: 7.6 g/dL (ref 6.5–8.1)

## 2024-03-31 MED ORDER — IOHEXOL 300 MG/ML  SOLN
100.0000 mL | Freq: Once | INTRAMUSCULAR | Status: AC | PRN
  Administered 2024-03-31: 100 mL via INTRAVENOUS

## 2024-04-01 ENCOUNTER — Encounter (HOSPICE_FACILITY): Payer: Self-pay

## 2024-04-01 ENCOUNTER — Ambulatory Visit (HOSPITAL_BASED_OUTPATIENT_CLINIC_OR_DEPARTMENT_OTHER)
Admission: RE | Admit: 2024-04-01 | Discharge: 2024-04-01 | Disposition: A | Discharge status: Home / Self Care | Source: Ambulatory Visit | Attending: Oncology | Admitting: Oncology

## 2024-04-01 ENCOUNTER — Encounter (HOSPITAL_BASED_OUTPATIENT_CLINIC_OR_DEPARTMENT_OTHER): Payer: Self-pay | Admitting: Radiology

## 2024-04-01 DIAGNOSIS — Z1231 Encounter for screening mammogram for malignant neoplasm of breast: Secondary | ICD-10-CM

## 2024-04-01 DIAGNOSIS — Z853 Personal history of malignant neoplasm of breast: Secondary | ICD-10-CM

## 2024-04-05 ENCOUNTER — Ambulatory Visit: Admitting: Oncology

## 2024-04-07 ENCOUNTER — Other Ambulatory Visit: Payer: Self-pay | Admitting: Oncology

## 2024-04-07 ENCOUNTER — Encounter: Payer: Self-pay | Admitting: Oncology

## 2024-04-07 ENCOUNTER — Inpatient Hospital Stay: Admitting: Oncology

## 2024-04-07 VITALS — BP 140/86 | HR 65 | Temp 98.2°F | Resp 18 | Ht 62.0 in | Wt 117.5 lb

## 2024-04-07 DIAGNOSIS — D490 Neoplasm of unspecified behavior of digestive system: Secondary | ICD-10-CM

## 2024-04-07 DIAGNOSIS — C641 Malignant neoplasm of right kidney, except renal pelvis: Secondary | ICD-10-CM

## 2024-04-07 DIAGNOSIS — Z853 Personal history of malignant neoplasm of breast: Secondary | ICD-10-CM

## 2024-04-07 NOTE — Progress Notes (Signed)
 [facility]  [address] [place],  [place]  [address] [phone]  Clinic Day: 04/07/24  Referring physician: Ofilia Lamar CROME, MD  CHIEF COMPLAINT:  CC: History of breast cancer  Current Treatment:  Surveillance  HISTORY OF PRESENT ILLNESS:  Linda Sanders is a 89 y.o. 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  She did receive chemotherapy and radiation but this was triple negative and so she is on follow-up but does have multiple other comorbidities including mycobacterium avium intracellulare, polymyositis, multiple pulmonary nodules, and irritable bowel syndrome.  She has repeat CT scans of the chest, abdomen and pelvis on a regular basis, yearly, to follow up on lesions of the lungs, pancreas and kidney.  She has multiple pulmonary nodules which have been present for years and are attributed to her polymyositis.  We have been following a unilocular cystic lesion in the uncinate process of the pancreas for years which has remained relatively stable.  She also has an enhancing lesion in the lower pole of the right kidney measuring 2.4 cm and relatively stable.  In addition to her triple negative breast cancer., she has 3 nieces with breast cancer and a first cousin with breast cancer at age 66.  Her daughter Linda Sanders has had a pancreatic neuroendocrine tumor, with  recurrence.  She also recently had a maternal first cousin with colon cancer who was tested for the colon cancer gene and was negative.  We have therefore recommended genetic testing per NCCN guidelines and that was done last year.  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  She comes in for yearly follow-up and repeat scans and mammogram.  She had surgical fusion for a herniated disc in May of 2017, and still complains of burning of her lower extremities  and intermittent edema.  Her hemoglobin dropped from 13 to 9 postop and she did have significant blood loss during surgery.  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  She also had pneumonia in June of 2017.  We reviewed her medications in detail and she is off the iron supplement, multivitamin and oxycodone , but is now on Lexapro 50 mg daily and vitamin-C 500 mg daily.  She had liver toxicity with Lipitor.  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 Her main complaints today are imbalance, weakness of her lower extremities, cough, dyspnea and occasional low-grade fevers.  She has had some nocturia and incontinence, and she has lost some weight. Her Rheumatologist, Dr. Leni, orders her bone density scans and the one in 2018 did show osteoporosis.  She lost her daughter Linda Sanders a few years ago, she was also one of our patients, and died of pancreatic cancer.  Annual bilateral mammogram from July 2021 was clear.  CT chest, abdomen and pelvis from July 2021 was essentially stable with slight interval enlargement of a soft tissue attenuation of the pancreas, and partially exophytic lesion of the posterior midportion of the right kidney measuring 3.5 x 2.3 cm.    INTERVAL HISTORY:  [name] is here for annual follow up for her history of breast cancer, as well as multiple other findings on CT scans. 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 Patient states that she feels poorly and complains of increased depression with loss of motivation for ADLs. I told her about [facility] and she expresses some interest so I will set this up for her. Unfortunately, she does not have access to a computer or internet so we will see if they can communicate by phone or perhaps her daughters could facilitate these meetings. She also pointed out a lump on the right side of the upper chest. She  denies tenderness to the area. Upon physical exam, it feels like costochondritis at the junction to the upper sternum. She visited Dr. Nathanael on 10/13/2023 for pulmonary follow-up and he feels that her main problem is bronchiectasis. We discussed this diagnosis and the fact that there is lack of treatment for this condition. Her screening mammogram on 04/01/2024 was negative. She had a CT chest, abdomen, pelvis on 03/31/2024 which revealed numerous bilateral pulmonary nodules, some of which are cavitary,are again seen which have decreased, increased, or are stable, a mildly heterogeneous 2.9 cm right lower pole renal lesion previously demonstrated hypoenhancement on multiphase CT February 13, 2022, compatible with renal cell carcinoma, similar size of the cystic lesion in the pancreatic uncinate process measuring 2.5 x 2.1 cm, previously 2.8 x 1.9 cm.  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 I explained these results to her and her daughter. As of 03/31/2024, she has a WBC of 8.3, hemoglobin of 12.1, and platelet count of 256,000. Her CMP is completely normal. I will see her back in 1 year with CBC,CMP, bilateral screening mammogram, and CT scans of chest, abdomen, and pelvis.  She denies fever, chills, night sweats, or other signs of infection. She denies cardiorespiratory and gastrointestinal issues. She  denies pain. Her appetite is poor and Her weight has decreased 8 pounds over last 13 months. She is accompanied by her daughter.   REVIEW OF SYSTEMS:  Review of Systems  Constitutional:  Positive for fatigue. Negative for appetite change, chills, diaphoresis, fever and unexpected weight change.  HENT:   Positive for hearing loss. Negative for lump/mass, mouth sores, nosebleeds, sore throat, tinnitus, trouble swallowing and voice change.   Eyes: Negative.  Negative  for eye problems and icterus.  Respiratory:  Positive for cough (productive of occasional sputum) and shortness of breath (chronic). Negative for chest tightness, hemoptysis and wheezing.   Cardiovascular: Negative.  Negative for chest pain, leg swelling and palpitations.  Gastrointestinal: Negative.  Negative for abdominal distention, abdominal pain, blood in stool, constipation, diarrhea, nausea, rectal pain and vomiting.  Endocrine: Negative.   Genitourinary: Negative.  Negative for bladder incontinence, difficulty urinating, dyspareunia, dysuria, frequency, hematuria, menstrual problem, nocturia, pelvic pain, vaginal bleeding and vaginal discharge.   Musculoskeletal:  Positive for arthralgias (chronic) and gait problem (declining balance). Negative for back pain, flank pain, myalgias, neck pain and neck stiffness.  Skin: Negative.  Negative for itching, rash and wound.  Neurological:  Positive for gait problem (declining balance) and headaches. Negative for dizziness, extremity weakness, light-headedness, numbness, seizures and speech difficulty.  Hematological: Negative.  Negative for adenopathy. Does not bruise/bleed easily.  Psychiatric/Behavioral:  Positive for depression. Negative for confusion, decreased concentration, sleep disturbance and suicidal ideas. The patient is not nervous/anxious.      VITALS:  Blood pressure (!) 140/86, pulse 65, temperature 98.2 F (36.8 C), temperature source Oral, resp. rate 18, height 5' 2 (1.575 m), weight 117  lb 8 oz (53.3 kg), SpO2 98%.  Wt Readings from Last 3 Encounters:  04/07/24 117 lb 8 oz (53.3 kg)  03/05/23 125 lb 14.4 oz (57.1 kg)  02/17/22 128 lb (58.1 kg)    Body mass index is 21.49 kg/m.  Performance status (ECOG): 1 - Symptomatic but completely ambulatory  PHYSICAL EXAM:  Physical Exam Vitals and nursing note reviewed. Exam conducted with a chaperone present.  Constitutional:      General: She is not in acute distress.     Appearance: Normal appearance. She is normal weight. She is not ill-appearing, toxic-appearing or diaphoretic.  HENT:     Head: Normocephalic and atraumatic.     Right Ear: Tympanic membrane, ear canal and external ear normal. There is no impacted cerumen.     Left Ear: Tympanic membrane, ear canal and external ear normal. There is no impacted cerumen.     Nose: Nose normal. No congestion or rhinorrhea.     Mouth/Throat:     Mouth: Mucous membranes are moist.     Pharynx: Oropharynx is clear. No oropharyngeal exudate or posterior oropharyngeal erythema.  Eyes:     General: No scleral icterus.       Right eye: No discharge.        Left eye: No discharge.     Extraocular Movements: Extraocular movements intact.     Conjunctiva/sclera: Conjunctivae normal.     Pupils: Pupils are equal, round, and reactive to light.  Neck:     Vascular: No carotid bruit.     Comments: Prominent bone in the right supraclavicular fossa, chronic.  Prominent costochondral junction in the right upper sternum. Cardiovascular:     Rate and Rhythm: Normal rate and regular rhythm.     Pulses: Normal pulses.     Heart sounds: Normal heart sounds. No murmur heard.    No friction rub. No gallop.  Pulmonary:     Effort: Pulmonary effort is normal. No respiratory distress.     Breath sounds: No stridor. Examination of the right-lower field reveals rales. Examination of the left-lower field reveals rales. Rales (bilateral, inspiratory) present. No wheezing or rhonchi.     Comments: Bilateral basilar rales Chest:     Chest wall: Tenderness present.     Comments: Bilateral breast are without masses. Right breast is negative. Left breast is considerably smaller, she has a large scar on top of the left breast which is mildly nodular but stable Bilateral breast are without masses.  Abdominal:     General: Bowel sounds are normal. There is no distension.     Palpations: Abdomen is soft. There is no hepatomegaly,  splenomegaly or mass.     Tenderness: There is no abdominal tenderness. There is no right CVA tenderness, left CVA tenderness, guarding or rebound.     Hernia: No hernia is present.  Musculoskeletal:        General: No swelling, tenderness, deformity or signs of injury. Normal range of motion.     Cervical back: Normal range of motion and neck supple. No rigidity or tenderness.     Right lower leg: No edema.     Left lower leg: No edema.  Lymphadenopathy:     Cervical: No cervical adenopathy.  Skin:    General: Skin is warm and dry.     Coloration: Skin is not jaundiced or pale.     Findings: No bruising, erythema, lesion or rash.  Neurological:     General: No focal deficit present.  Mental Status: She is alert and oriented to person, place, and time. Mental status is at baseline.     Cranial Nerves: No cranial nerve deficit.     Sensory: No sensory deficit.     Motor: No weakness.     Coordination: Coordination normal.     Gait: Gait normal.     Deep Tendon Reflexes: Reflexes normal.  Psychiatric:        Mood and Affect: Mood normal.        Behavior: Behavior normal.        Thought Content: Thought content normal.        Judgment: Judgment normal.     LABS:      Latest Ref Rng & Units 03/31/2024   10:01 AM 02/24/2023   12:00 AM 02/13/2022   12:00 AM  CBC  WBC 4.0 - 10.5 K/uL 8.3  7.6     6.8      Hemoglobin 12.0 - 15.0 g/dL 87.8  87.0     87.1      Hematocrit 36.0 - 46.0 % 37.5  38     38      Platelets 150 - 400 K/uL 256  263     229         This result is from an external source.      Latest Ref Rng & Units 03/31/2024   10:01 AM 02/24/2023   12:00 AM 02/13/2022   12:00 AM  CMP  Glucose 70 - 99 mg/dL 92     BUN 8 - 23 mg/dL 11  10     11       Creatinine 0.44 - 1.00 mg/dL 9.19  0.7     0.8      Sodium 135 - 145 mmol/L 137  134     132      Potassium 3.5 - 5.1 mmol/L 4.1  4.4     4.6      Chloride 98 - 111 mmol/L 99  98     97      CO2 22 - 32 mmol/L 25  29      30       Calcium 8.9 - 10.3 mg/dL 89.9  89.6     9.5      Total Protein 6.5 - 8.1 g/dL 7.6     Total Bilirubin 0.0 - 1.2 mg/dL 0.4     Alkaline Phos 38 - 126 U/L 68  58     55      AST 15 - 41 U/L 28  36     30      ALT 0 - 44 U/L 26  30     20          This result is from an external source.    STUDIES:  EXAM: 04/01/2024 DIGITAL SCREENING BILATERAL MAMMOGRAM WITH TOMOSYNTHESIS AND CAD IMPRESSION: No mammographic evidence of malignancy. A result letter of this screening mammogram will be mailed directly to the patient.  EXAM: 03/31/2024 CT CHEST, ABDOMEN, AND PELVIS WITH CONTRAST IMPRESSION: 1. Numerous bilateral pulmonary nodules some of which are cavitary are again seen. Some of these have decreased in size while others have increased in size or are stable. Findings are favored to reflect an infectious or inflammatory process including atypical infection such as MAI. However, metastatic disease is not excluded. Continued attention on follow-up imaging suggested. 2. Mildly heterogeneous 2.9 cm right lower pole renal lesion previously demonstrated hypoenhancement on multiphase CT February 13, 2022,  compatible with renal cell carcinoma. 3. Similar size of the cystic lesion in the pancreatic uncinate process measuring 2.5 x 2.1 cm previously 2.8 x 1.9 cm. Suggest continued attention on follow-up oncologic imaging. 4. Diffuse hepatic steatosis. 5. Colonic diverticulosis without findings of acute diverticulitis. 6. 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 Suggest continued attention on follow-up imaging.  Exam: 02/24/2023 CT Chest, Abdomen, and Pelvis Impression: Unchanged heterogeneously mass of the peripheral inferior pole of the right kidney measuring 3.5 x 2.4 cm, consistent with a very indolent renal cell carcinoma or oncocytoma.  No evidence of renal vein invasion, lymphadenopathy or metastatic disease in the chest, abdomen, and pelvis. New  and increased clustered centrilobular nodularity throughout the left lower lobe, consistent with worsened, ongoing atypical infection, particularly mycobacterium. Otherwise unchanged background of bandlike scarring, calcification, and clustered centrilobular and tree-in-bud nodularity throughout the lungs, consistent with chronic sequelae of atypical mycobacterial infection.  Unchanged cystic lesion in the pancreatic uncinate measuring 2.8 x 1.9cm, consistent with a side branch IPMN. No specific further follow-up or characterization is required for this lesion given other known primary malignancy, well established stability and advanced patient age.  Coronary Artery Disease Unchanged enlargement of the tubular ascending thoracic aorta measuring up to 4.2cm. Recommend annual imaging follow-up by CTA or MRA if clinically appropriate and if not otherwise imaged in this patient with known primary malignancy. This recommendation follows 2010 ACCF/AHA/AATS/ASA/SCA/SCAI/SIR/STS/SVM. Guidelines for the diagnosis and Management of Patients with Thoracic Aortic Disease. Circulation. 2010; 121: Z733-z630. Aortic Aneurysm NOS (ICD10-171.9) Aortic Atherosclerosis (ICD10-170.0)   Allergies:  Allergies  Allergen Reactions   Amoxicillin Other (See Comments)   Amoxicillin-Pot Clavulanate Nausea Only and Other (See Comments)    Other reaction(s): Other (See Comments)  (GI)   Codeine Other (See Comments)    Vomiting   Other reaction(s): GI Upset (intolerance)  (GI)   Clarithromycin Diarrhea, Other (See Comments) and Itching    diarrhea   Avelox [Moxifloxacin Hcl In Nacl]     Upset stomach    Bupropion  Other (See Comments)    Other reaction(s): Mental Status Changes (intolerance)  aggitation   Chlorpheniramine     Other reaction(s): Other (See Comments) Other   Clarithromycin     Upset stomach    Cymbalta [Duloxetine Hcl]     Blurred vision    Metronidazole And Other Nitroimidazoles     Rash,  itching    Mirtazapine Nausea Only    constipation   Moxifloxacin     Upset stomach    Nitrofurantoin Other (See Comments)   Other Other (See Comments)    Other   Amoxicillin-Pot Clavulanate Nausea And Vomiting   Metronidazole Rash    Rash, itching , 2018: IV and PO no AE   Sulfamethoxazole-Trimethoprim Dermatitis   Venlafaxine Other (See Comments)    Current Medications: Current Outpatient Medications  Medication Sig Dispense Refill   acetaminophen  (TYLENOL ) 325 MG tablet Take 650 mg by mouth every 6 (six) hours as needed for mild pain.      ASPIRIN LOW DOSE 81 MG tablet Take 81 mg by mouth daily.     benzonatate  (TESSALON ) 100 MG capsule Take 2 capsules by mouth 3 (three) times daily as needed for cough.     calcium carbonate (TUMS - DOSED IN MG ELEMENTAL CALCIUM) 500 MG chewable tablet Chew 1 tablet by mouth daily as needed for indigestion or heartburn. (Patient taking differently: Chew 1 tablet by mouth daily.)     carvedilol  (COREG )  12.5 MG tablet Take 25 mg by mouth 2 (two) times daily.      esomeprazole (NEXIUM) 40 MG capsule Take 40 mg by mouth daily.     melatonin (MELATONIN MAXIMUM STRENGTH) 5 MG TABS Take by mouth.     mirabegron ER (MYRBETRIQ) 25 MG TB24 tablet TAKE 1 TABLET BY MOUTH EVERY DAY AFTER A MEAL FOR OVERACTIVE BLADDER     ondansetron (ZOFRAN) 4 MG tablet Take 4 mg by mouth every 8 (eight) hours as needed for nausea or vomiting.     rosuvastatin (CRESTOR) 40 MG tablet Take 40 mg by mouth daily.     sertraline  (ZOLOFT ) 100 MG tablet Take 100 mg by mouth.     SYNTHROID  50 MCG tablet Take 50 mcg by mouth.     No current facility-administered medications for this visit.     ASSESSMENT & PLAN:  Assessment:   1. Stage IIA triple negative breast cancer now 17 years postop with no evidence of disease.  She was treated with surgery, radiation and chemotherapy.  2. Diagnosis of polymyositis.  3. Multiple pulmonary nodules felt to be secondary to her  polymyositis.  4. 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 She is following up with her pulmonologist, who feels her main problem is bronchiectasis.   5. Stable cystic lesion in the uncinate process of the pancreas, slightly larger over time, consistent with IPMN.  6. Right renal lesion is stable, but could be an indolent renal cell carcinoma or oncocytoma.  In view of its mild and slow enlargement, I see no urgency for cryoablation.  7. Stable lesion in the dome of the liver consistent with hemangioma by MRI scan.    8. Osteoporosis.  9. 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  10.  Enlargement of the ascending thoracic aorta, stable at 4.1 cm as of 03/31/2024.   Plan: Linda Sanders is here for annual follow up for her history of breast cancer, as well as multiple other findings on CT scans. These include a mass of the right kidney which is suspicious for a renal cell carcinoma and a cystic mass of the pancreas consistent with an IPMN. Patient states that she feels poorly and complains of increased depression with loss of motivation for ADLs. I told her about [facility] and she expresses some interest so I will set this up for her. Unfortunately, she does not have access to a computer or internet so we will see if they can communicate by phone or perhaps her daughters could facilitate these meetings. She also pointed out a lump on the upper right side of chest. She denies tenderness to the area. Upon physical exam, it feels like costochondritis at the junction to the upper sternum. She visited Dr. Nathanael on 10/13/2023 for pulmonary follow-up and he feels that her main problem is bronchiectasis. We discussed this diagnosis and the fact that there is lack of treatment for this condition. Her screening mammogram on 04/01/2024 was negative. She had a CT chest, abdomen, pelvis on 03/31/2024 which revealed numerous bilateral  pulmonary nodules, some of which are cavitary, are again see which have decreased, increased, or are stable, mildly heterogeneous 2.9 cm right lower pole renal lesion previously demonstrated hypoenhancement on multiphase CT February 13, 2022, compatible with renal cell carcinoma, similar size of the cystic lesion in the pancreatic uncinate process measuring 2.5 x 2.1 cm, previously 2.8 x 1.9 cm. 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 I explained these results to her and her daughter. As of 03/31/2024, she has a WBC of 8.3, hemoglobin of 12.1, and platelet count of 256,000. Her CMP is completely normal. I will see her back in 1 year with CBC,CMP, bilateral screening mammogram, and CT scans of chest, abdomen, and pelvis.  She understands and agrees with this plan of care.    I provided 26 minutes of face-to-face time during this this encounter and > 50% was spent counseling as documented under my assessment and plan.    Wanda VEAR Cornish, MD [facility] [address] [place] [address] Dept: [phone] Dept Fax: [phone]    I,Levone Otten H Jeromy Borcherding,acting as a scribe for Wanda VEAR Cornish, MD.,have documented all relevant documentation on the behalf of Wanda VEAR Cornish, MD,as directed by  Wanda VEAR Cornish, MD while in the presence of Wanda VEAR Cornish, MD.

## 2024-04-11 NOTE — Progress Notes (Signed)
 The patient enrolled in services on 04/09/2024.  She is scheduled for an initial behavioral health evaluation on 04/18/2024.

## 2024-04-13 NOTE — Hospice (Signed)
 Pt expired at her son's home at [address] [facility]. No signs of life at initiation of visit, pronounced 4:25pm. DME to be picked up at her daughter's/MPOA's home. Office notified DME company. Pt expired at 4:25pm, RN contacted [facility] line at [phone]. Body to be taken to [facility] in [place]. Family are present and grieving appropriately.

## 2024-04-13 DEATH — deceased

## 2024-04-18 DIAGNOSIS — F4320 Adjustment disorder, unspecified: Secondary | ICD-10-CM | POA: Insufficient documentation

## 2024-06-16 ENCOUNTER — Telehealth: Payer: Self-pay

## 2024-06-16 NOTE — Telephone Encounter (Signed)
 [facility] CSW Progress Note  Clinical Child Psychotherapist contacted patient for continuum of care following discharge from [facility]. CSW was informed patient completed intake and was actively engaged. CSW informed patient purpose of call. CSW informed patient CSW offers clinical counseling and emotional support. Patient stated she was in need but did not know if she wanted services. CSW informed patient services are always available. Patient unable to take phone number. Patient aware to call cancer center if needs change. No other follow up scheduled at this time.  Lizbeth Sprague, LCSW Clinical Social Worker [facility]

## 2025-04-07 ENCOUNTER — Ambulatory Visit: Admitting: Oncology
